# Patient Record
Sex: Female | Born: 1965 | Race: White | Hispanic: No | Smoking: Former smoker
Health system: Southern US, Community
[De-identification: ages and names within clinical notes are randomized; demographics above are authoritative.]

## PROBLEM LIST (undated history)

## (undated) DIAGNOSIS — T7840XA Allergy, unspecified, initial encounter: Secondary | ICD-10-CM

## (undated) DIAGNOSIS — E785 Hyperlipidemia, unspecified: Secondary | ICD-10-CM

## (undated) DIAGNOSIS — F32A Depression, unspecified: Secondary | ICD-10-CM

## (undated) DIAGNOSIS — R7303 Prediabetes: Secondary | ICD-10-CM

## (undated) DIAGNOSIS — I10 Essential (primary) hypertension: Secondary | ICD-10-CM

## (undated) DIAGNOSIS — D649 Anemia, unspecified: Secondary | ICD-10-CM

## (undated) DIAGNOSIS — F419 Anxiety disorder, unspecified: Secondary | ICD-10-CM

## (undated) HISTORY — DX: Hyperlipidemia, unspecified: E78.5

## (undated) HISTORY — DX: Essential (primary) hypertension: I10

## (undated) HISTORY — DX: Allergy, unspecified, initial encounter: T78.40XA

## (undated) HISTORY — DX: Depression, unspecified: F32.A

---

## 2010-01-29 DIAGNOSIS — D709 Neutropenia, unspecified: Secondary | ICD-10-CM | POA: Insufficient documentation

## 2010-06-18 ENCOUNTER — Ambulatory Visit: Payer: Self-pay | Admitting: Oncology

## 2010-06-25 LAB — CBC WITH DIFFERENTIAL/PLATELET
BASO%: 0.2 % (ref 0.0–2.0)
Basophils Absolute: 0 10*3/uL (ref 0.0–0.1)
EOS%: 1.7 % (ref 0.0–7.0)
Eosinophils Absolute: 0.1 10*3/uL (ref 0.0–0.5)
HCT: 36.1 % (ref 34.8–46.6)
HGB: 11.9 g/dL (ref 11.6–15.9)
LYMPH%: 38.8 % (ref 14.0–49.7)
MCH: 26.2 pg (ref 25.1–34.0)
MCHC: 33 g/dL (ref 31.5–36.0)
MCV: 79.5 fL (ref 79.5–101.0)
MONO#: 0.4 10*3/uL (ref 0.1–0.9)
MONO%: 10 % (ref 0.0–14.0)
NEUT#: 2 10*3/uL (ref 1.5–6.5)
NEUT%: 49.3 % (ref 38.4–76.8)
Platelets: 248 10*3/uL (ref 145–400)
RBC: 4.54 10*6/uL (ref 3.70–5.45)
RDW: 13.8 % (ref 11.2–14.5)
WBC: 4 10*3/uL (ref 3.9–10.3)
lymph#: 1.6 10*3/uL (ref 0.9–3.3)
nRBC: 0 % (ref 0–0)

## 2010-06-25 LAB — COMPREHENSIVE METABOLIC PANEL
ALT: 13 U/L (ref 0–35)
AST: 20 U/L (ref 0–37)
Albumin: 4.3 g/dL (ref 3.5–5.2)
Alkaline Phosphatase: 47 U/L (ref 39–117)
BUN: 14 mg/dL (ref 6–23)
CO2: 27 mEq/L (ref 19–32)
Calcium: 9.1 mg/dL (ref 8.4–10.5)
Chloride: 105 mEq/L (ref 96–112)
Creatinine, Ser: 0.85 mg/dL (ref 0.40–1.20)
Glucose, Bld: 90 mg/dL (ref 70–99)
Potassium: 3.8 mEq/L (ref 3.5–5.3)
Sodium: 139 mEq/L (ref 135–145)
Total Bilirubin: 0.4 mg/dL (ref 0.3–1.2)
Total Protein: 6.8 g/dL (ref 6.0–8.3)

## 2010-06-25 LAB — CHCC SMEAR

## 2013-05-30 ENCOUNTER — Institutional Professional Consult (permissible substitution): Payer: Self-pay | Admitting: Cardiology

## 2013-06-13 ENCOUNTER — Encounter: Payer: Self-pay | Admitting: *Deleted

## 2013-06-15 ENCOUNTER — Ambulatory Visit (INDEPENDENT_AMBULATORY_CARE_PROVIDER_SITE_OTHER): Payer: BC Managed Care – PPO | Admitting: Cardiology

## 2013-06-15 ENCOUNTER — Encounter: Payer: Self-pay | Admitting: Cardiology

## 2013-06-15 VITALS — BP 124/86 | HR 83 | Ht 61.0 in | Wt 172.0 lb

## 2013-06-15 DIAGNOSIS — R079 Chest pain, unspecified: Secondary | ICD-10-CM | POA: Insufficient documentation

## 2013-06-15 DIAGNOSIS — E785 Hyperlipidemia, unspecified: Secondary | ICD-10-CM | POA: Insufficient documentation

## 2013-06-15 NOTE — Addendum Note (Signed)
Addended by: Demetrios Loll on: 06/15/2013 11:19 AM   Modules accepted: Orders

## 2013-06-15 NOTE — Progress Notes (Signed)
Patient ID: DARIUS FILLINGIM, female   DOB: [birth year], 47 y.o.   MRN: [id number]    Patient Name: Michele [name] Date of Encounter: 06/15/2013  Primary Care Provider:  No primary provider on file. Primary Cardiologist:  Tobias Alexander, H  Patient Profile  Chest pain   Problem List   Past Medical History  Diagnosis Date  . Hypertension    Past Surgical History  Procedure Laterality Date  . Cesarean section      TWO of them    Allergies  Allergies  Allergen Reactions  . Sulfa Antibiotics     HPI  A very pleasant 47 year old female with prior medical history of hyperlipidemia, currently on no treatment, who is concerned about her chest pain. The patient has a family history of coronary artery disease, her father passed from a major heart attack in his 50s. His only symptoms are mild heartburns. The patient states that she was in excellent health, she just recently moved back from [place], where a couple months ago she started to experience mild pressure-like chest pain. The patient states that the pain is nonradiating . It is located behind the lower sternum and on the left chest, sometimes it feels like indigestion, and it can last from minutes up to 6 hours. It is not associated with any exertion. It usually happens when she is at rest. It is not associated with any other symptoms like dizziness, shortness of breath, she is no prior history of syncope. The patient complains that recently she has gained weight and that she is less active than she wishes for. When she walks the dog she has no symptoms. Her cholesterol was checked while she was in [place] and she only remembers total cholesterol that was 270, when repeated by her primary care physician here in [place] 240. She is supposed to follow with her primary care physician for the management of her cholesterol.  Home Medications  Prior to Admission medications   Medication Sig Start Date End Date Taking?  Authorizing Provider  citalopram (CELEXA) 20 MG tablet Take 20 mg by mouth daily.    Historical Provider, MD    Family History  Family History  Problem Relation Age of Onset  . Hypertension Sister   . Heart attack Father     Social History  History   Social History  . Marital Status: Married    Spouse Name: N/A    Number of Children: N/A  . Years of Education: N/A   Occupational History  . Not on file.   Social History Main Topics  . Smoking status: Former Games developer  . Smokeless tobacco: Not on file  . Alcohol Use: No  . Drug Use: No  . Sexual Activity: Not on file   Other Topics Concern  . Not on file   Social History Narrative   The pt is a homemaker -pt quit smoking cigarettes 2004      Review of Systems, as per history of present illness otherwise negative General:  No chills, fever, night sweats or weight changes.  Cardiovascular:  No chest pain, dyspnea on exertion, edema, orthopnea, palpitations, paroxysmal nocturnal dyspnea. Dermatological: No rash, lesions/masses Respiratory: No cough, dyspnea Urologic: No hematuria, dysuria Abdominal:   No nausea, vomiting, diarrhea, bright red blood per rectum, melena, or hematemesis Neurologic:  No visual changes, wkns, changes in mental status. All other systems reviewed and are otherwise negative except as noted above.  Physical Exam  Blood pressure 124/86, pulse 83, height 5'  1" (1.549 m), weight 172 lb (78.019 kg).  General: Pleasant, NAD Psych: Normal affect. Neuro: Alert and oriented X 3. Moves all extremities spontaneously. HEENT: Normal  Neck: Supple without bruits or JVD. Lungs:  Resp regular and unlabored, CTA. Heart: RRR no s3, s4, or murmurs. Abdomen: Soft, non-tender, non-distended, BS + x 4.  Extremities: No clubbing, cyanosis or edema. DP/PT/Radials 2+ and equal bilaterally.  Accessory Clinical Findings  ECG - SR, 83 BPM    Assessment & Plan  A very pleasant 47 year old female  1.  Atypical chest pain - considering patient's risk factors that are obesity, significant family history, and hyperlipidemia we will order exercise treadmill stress test as her baseline EKG is completely normal  2. Hyperlipidemia - it is possible that this is genetic in origin considering her family history. We will request her cholesterol panel from her primary care physician, in addition to these we'll order NMR lipid panel and a lipoprotein a.   Followup once those tests are performed.   Tobias Alexander, Rexene Edison, MD 06/15/2013, 10:34 AM

## 2013-06-15 NOTE — Addendum Note (Signed)
Addended by: Demetrios Loll on: 06/15/2013 11:23 AM   Modules accepted: Orders

## 2013-06-15 NOTE — Patient Instructions (Signed)
Your physician has requested that you have an exercise tolerance test. For further information please visit [email or web address] Please also follow instruction sheet, as given.  Your physician recommends that you return for lab work in: today  Your physician recommends that you schedule a follow-up appointment in: after GXT

## 2013-06-16 LAB — LIPOPROTEIN A (LPA): Lipoprotein (a): 77 mg/dL — ABNORMAL HIGH (ref 0–30)

## 2013-06-16 LAB — NMR LIPOPROFILE WITH LIPIDS
Cholesterol, Total: 206 mg/dL — ABNORMAL HIGH (ref <200)
HDL Particle Number: 38.7 umol/L (ref >=30.5)
HDL Size: 9.1 nm — ABNORMAL LOW (ref >=9.2)
HDL-C: 63 mg/dL (ref >=40)
LDL (calc): 116 mg/dL — ABNORMAL HIGH (ref <100)
LDL Particle Number: 1549 nmol/L — ABNORMAL HIGH (ref <1000)
LDL Size: 21.6 nm (ref >20.5)
LP-IR Score: 29 (ref <=45)
Large HDL-P: 7.2 umol/L (ref >=4.8)
Large VLDL-P: 1.5 nmol/L (ref <=2.7)
Small LDL Particle Number: 397 nmol/L (ref <=527)
Triglycerides: 134 mg/dL (ref <150)
VLDL Size: 44.7 nm (ref <=46.6)

## 2013-06-24 ENCOUNTER — Ambulatory Visit (INDEPENDENT_AMBULATORY_CARE_PROVIDER_SITE_OTHER): Payer: BC Managed Care – PPO | Admitting: Nurse Practitioner

## 2013-06-24 ENCOUNTER — Ambulatory Visit (INDEPENDENT_AMBULATORY_CARE_PROVIDER_SITE_OTHER): Payer: BC Managed Care – PPO | Admitting: Pharmacist

## 2013-06-24 VITALS — BP 129/80

## 2013-06-24 VITALS — Wt 173.0 lb

## 2013-06-24 DIAGNOSIS — Z79899 Other long term (current) drug therapy: Secondary | ICD-10-CM

## 2013-06-24 DIAGNOSIS — R079 Chest pain, unspecified: Secondary | ICD-10-CM

## 2013-06-24 DIAGNOSIS — E785 Hyperlipidemia, unspecified: Secondary | ICD-10-CM

## 2013-06-24 MED ORDER — ATORVASTATIN CALCIUM 40 MG PO TABS: 40.0000 mg | ORAL_TABLET | Freq: Every day | ORAL | Status: DC

## 2013-06-24 NOTE — Progress Notes (Signed)
Exercise Treadmill Test  Pre-Exercise Testing Evaluation Rhythm: normal sinus  Rate: 78 bpm     Test  Exercise Tolerance Test Ordering MD: Tobias Alexander, MD  Interpreting MD: Norma Fredrickson, NP  Unique Test No: 1  Treadmill:  1  Indication for ETT: chest pain - rule out ischemia  Contraindication to ETT: No   Stress Modality: exercise - treadmill  Cardiac Imaging Performed: non   Protocol: standard Bruce - maximal  Max BP:  152/61  Max MPHR (bpm):  174 85% MPR (bpm):  148  MPHR obtained (bpm):  155 % MPHR obtained:  89%  Reached 85% MPHR (min:sec):  6:30 Total Exercise Time (min-sec):  9 minutes  Workload in METS:  10.1 Borg Scale: 16  Reason ETT Terminated:  patient's desire to stop    ST Segment Analysis At Rest: normal ST segments - no evidence of significant ST depression With Exercise: no evidence of significant ST depression  Other Information Arrhythmia:  No Angina during ETT:  absent (0) Quality of ETT:  diagnostic  ETT Interpretation:  normal - no evidence of ischemia by ST analysis  Comments: Patient presents today for routine GXT. Has had atypical chest pain. Strong FH for CAD.   Today the patient exercised on the standard Bruce protocol for a total of 9 minutes.  Good exercise tolerance.  Adequate blood pressure response.  Clinically negative for chest pain. Test was stopped due to achievement of target heart rate.  EKG negative for ischemia. No significant arrhythmia noted.   Recommendations: CV risk factor modification.  Starting statin today  See back as planned.  Patient is agreeable to this plan and will call if any problems develop in the interim.   Rosalio Macadamia, RN, ANP-C [facility] [address] [place], [place]  [address]

## 2013-06-24 NOTE — Assessment & Plan Note (Addendum)
Given patient's non-modifiable risk factor of Family h/o premature CAD and an elevated Lp(a) which is greater than 2x ULN, we will start a statin today to try and achieve LDL-P goal < 1000 nmol/L.  Patient's weight gain of 20-25 lbs over past 2 years is directly related to her diet while in [place] and lack of exercise while overseas as well.  Since she previously was active and counted calories, she can likely lose much of this weight again now that she has moved back to [place] again.  An elevated Lp(a) assists [place] in risk stratifying patient, and as no studies have shown directly lowering Lp(a) lowers risk (niacin studies), we will instead shoot for a lower LDL goal.  Therefore her LDL goal is < 100 mg/dL instead of < 782 mg/dL, and thus LDL-P goal of < 1000 nmol/L.  Will start her on atorvastatin 40 mg qd in hopes of getting at least 40% LDL reduction, which will also likely get LDL-P to goal of < 1000 nmol/L.  Her improvement in diet and lifestyle will also assist with this.  Patient doesn't plan on having anymore children, but she was advised to stop statin if she becomes pregnant.  No need to recheck Lp(a) as it is used to risk stratify and not going to assist in medication management with future levels.    Plan: 1.  Start atorvastatin 40 mg daily. 2.  Start walking 30-40 minutes per day at minimum.  5 days a week at least. 3.  Reduce breads and desserts in diet.  Try to stick to 1400-1500 calories per day - < 30% from fat.   4.  Try to lose at least 5 lbs over the next 3 months. 5.  Recheck cholesterol in 3 months ( 09/30/13 - Any time of day as long as fasting   ), and see me the next week (10/07/13 at 11:15)  to review results.

## 2013-06-24 NOTE — Progress Notes (Signed)
Patient is pleasant 47 y.o. Female who just moved back to the [place] from [place].  She was living in [place] for the past 2 years while her husband worked there, but now back living [place] the [place]   While living [place] her diet was terrible, and "lived as if she was on vacation" for two years.  Her diet was heavy in chocolate, desserts, breads, etc.  She was not active at all.  She gained 25 lbs over that two year span.  Her previous weight was 145 lbs, and now up to ~ 173 lbs.  Patient's father died of an MI when he was 75 y.o., which is the same age she is now.  She also c/o some CP which seemed to be more related to indigestion.  Getting a treadmill exercise stress test today.  Dr. Delton See thus decided to get an NMR and Lp(a) done on her given her family h/o premature heart disease.  LDL-P and Lp(a) were both elevated.  Of note, when in [place] her TC was up to 270 mg/dL and they wanted to put her on statin, however she declined as she was fearful of the language barrier and need to get routine blood work done in a country she didn't speak the language in.  Risk Factors:  Family history of premature CAD (father MI at 54 y.o.), and elevated Lp(a) of 77 mg/dL, and overweight. Goals:  LDL < 100, non-HDL goal < 161, LDL-P goal < 1000 Medications:  Not currently on lipid lowering medications.  Will start statin today.  Social history: Rarely drinks alcohol.  She smoked for 25 years, but quit in 2004.  Diet:  Two years ago when she was living in [place] she was typically counting calories, and eating low fat diet.  She moved to [place] in 2012 and since then "ate as if she was on vacation" for past 2 years.  She ate lots of chocolate, breads, desserts, etc.  She gained 20-25 lbs over past 2 years while living in [place].  Exercise:  No exercise at all for the past 2 years while living in [place].  Now that she has moved back to [facility]. she is agreeable to start walking regularly again.  She states  she gets dizzy if she jogs too long.  Labs:  06/21/13: Lp(a) 77 mg/dL (goal < 30); LDL-P  0960, LDL 116, HDL 63, TG 134, TC 206, non-HDL 143 (not on lipid lowering meds)  Meds:   - Nasonex - Celexa  Allergies  Allergen Reactions  . Sulfa Antibiotics    Family History  Problem Relation Age of Onset  . Hypertension Sister   . Heart attack Father

## 2013-06-24 NOTE — Patient Instructions (Signed)
1.  Start atorvastatin 40 mg daily. 2.  Start walking 30-40 minutes per day at minimum.  5 days a week at least. 3.  Reduce breads and desserts in diet.  Try to stick to 1400-1500 calories per day - < 30% from fat.   4.  Try to lose at least 5 lbs over the next 3 months. 5.  Recheck cholesterol in 3 months ( 09/30/13 - Any time of day as long as fasting   ), and see me the next week (10/07/13 at 11:15)  to review results.

## 2013-06-29 ENCOUNTER — Encounter: Payer: Self-pay | Admitting: Cardiology

## 2013-06-29 ENCOUNTER — Ambulatory Visit (INDEPENDENT_AMBULATORY_CARE_PROVIDER_SITE_OTHER): Payer: BC Managed Care – PPO | Admitting: Cardiology

## 2013-06-29 VITALS — BP 122/72 | HR 84 | Ht 60.0 in | Wt 171.0 lb

## 2013-06-29 DIAGNOSIS — R0789 Other chest pain: Secondary | ICD-10-CM | POA: Insufficient documentation

## 2013-06-29 DIAGNOSIS — E785 Hyperlipidemia, unspecified: Secondary | ICD-10-CM

## 2013-06-29 NOTE — Patient Instructions (Signed)
Your physician recommends that you continue on your current medications as directed. Please refer to the Current Medication list given to you today.  Your physician wants you to follow-up in: 1 year. You will receive a reminder letter in the mail two months in advance. If you don't receive a letter, please call our office to schedule the follow-up appointment.  

## 2013-06-29 NOTE — Progress Notes (Signed)
Patient ID: TONIYAH DILMORE, female   DOB: Jul 20, 1966, 47 y.o.   MRN: [id number]  Patient Name: Michele [name] Date of Encounter: 06/29/2013  Primary Care Provider:  No primary provider on file. Primary Cardiologist:  Tobias Alexander, H  Patient Profile  Chest pain   Problem List   Past Medical History  Diagnosis Date  . Hypertension    Past Surgical History  Procedure Laterality Date  . Cesarean section      TWO of them    Allergies  Allergies  Allergen Reactions  . Sulfa Antibiotics     HPI  A very pleasant 47 year old female with prior medical history of hyperlipidemia, currently on no treatment, who is concerned about her chest pain. The patient has a family history of coronary artery disease, her father passed from a major heart attack in his 64s. His only symptoms are mild heartburns. The patient states that she was in excellent health, she just recently moved back from [place], where a couple months ago she started to experience mild pressure-like chest pain. The patient states that the pain is nonradiating . It is located behind the lower sternum and on the left chest, sometimes it feels like indigestion, and it can last from minutes up to 6 hours. It is not associated with any exertion. It usually happens when she is at rest. It is not associated with any other symptoms like dizziness, shortness of breath, she is no prior history of syncope. The patient complains that recently she has gained weight and that she is less active than she wishes for. When she walks the dog she has no symptoms. Her cholesterol was checked while she was in [place] and she only remembers total cholesterol that was 270, when repeated by her primary care physician here in [place] 240. She is supposed to follow with her primary care physician for the management of her cholesterol.  2 week follow up.  Home Medications  Prior to Admission medications   Medication Sig Start  Date End Date Taking? Authorizing Provider  citalopram (CELEXA) 20 MG tablet Take 20 mg by mouth daily.    Historical Provider, MD    Family History  Family History  Problem Relation Age of Onset  . Hypertension Sister   . Heart attack Father     Social History  History   Social History  . Marital Status: Married    Spouse Name: N/A    Number of Children: N/A  . Years of Education: N/A   Occupational History  . Not on file.   Social History Main Topics  . Smoking status: Former Games developer  . Smokeless tobacco: Not on file  . Alcohol Use: No  . Drug Use: No  . Sexual Activity: Not on file   Other Topics Concern  . Not on file   Social History Narrative   The pt is a homemaker -pt quit smoking cigarettes 2004      Review of Systems, as per history of present illness otherwise negative General:  No chills, fever, night sweats or weight changes.  Cardiovascular:  No chest pain, dyspnea on exertion, edema, orthopnea, palpitations, paroxysmal nocturnal dyspnea. Dermatological: No rash, lesions/masses Respiratory: No cough, dyspnea Urologic: No hematuria, dysuria Abdominal:   No nausea, vomiting, diarrhea, bright red blood per rectum, melena, or hematemesis Neurologic:  No visual changes, wkns, changes in mental status. All other systems reviewed and are otherwise negative except as noted above.  Physical Exam   General:  Pleasant, NAD Psych: Normal affect. Neuro: Alert and oriented X 3. Moves all extremities spontaneously. HEENT: Normal  Neck: Supple without bruits or JVD. Lungs:  Resp regular and unlabored, CTA. Heart: RRR no s3, s4, or murmurs. Abdomen: Soft, non-tender, non-distended, BS + x 4.  Extremities: No clubbing, cyanosis or edema. DP/PT/Radials 2+ and equal bilaterally.  Accessory Clinical Findings  ECG - SR, 83 BPM  Lipid Panel     Component Value Date/Time   TRIG 134 06/15/2013 1120   LDLCALC 116* 06/15/2013 1120     Assessment & Plan  A  very pleasant 47 year old female  1. Atypical chest pain - normal ETT and good exercise tolerance, no symptoms.  She is very motivated to start exercising, she joined a gym and walks daily.  2. Hyperlipidemia - NMR lipid panel and a lipoprotein a done, LPa 77 (over twice the normal limit) and LDL 116. Riki Rusk Smart following, started on Zocor 40 mg PO daily. [place] started exercise program and weight loss. Follow up lipid profile and CMR in 3 months.   3. H/o HELLP sy and preeclampsia during her second pregnancy, we will monitor BP closely, normal at two consecutive visits.   Followup in lipid clinic in 3 month, with me in 1 year.  Tobias Alexander, Rexene Edison, MD 06/29/2013, 10:42 AM

## 2013-09-30 ENCOUNTER — Other Ambulatory Visit: Payer: BC Managed Care – PPO

## 2013-10-03 ENCOUNTER — Other Ambulatory Visit (INDEPENDENT_AMBULATORY_CARE_PROVIDER_SITE_OTHER): Payer: BC Managed Care – PPO

## 2013-10-03 DIAGNOSIS — E785 Hyperlipidemia, unspecified: Secondary | ICD-10-CM

## 2013-10-03 DIAGNOSIS — Z79899 Other long term (current) drug therapy: Secondary | ICD-10-CM

## 2013-10-03 LAB — HEPATIC FUNCTION PANEL
ALT: 17 U/L (ref 0–35)
AST: 21 U/L (ref 0–37)
Albumin: 3.7 g/dL (ref 3.5–5.2)
Alkaline Phosphatase: 57 U/L (ref 39–117)
Bilirubin, Direct: 0.1 mg/dL (ref 0.0–0.3)
Total Bilirubin: 0.5 mg/dL (ref 0.3–1.2)
Total Protein: 6.7 g/dL (ref 6.0–8.3)

## 2013-10-04 LAB — NMR LIPOPROFILE WITH LIPIDS
Cholesterol, Total: 169 mg/dL (ref <200)
HDL Particle Number: 40.7 umol/L (ref >=30.5)
HDL Size: 9.3 nm (ref >=9.2)
HDL-C: 62 mg/dL (ref >=40)
LDL (calc): 76 mg/dL (ref <100)
LDL Particle Number: 863 nmol/L (ref <1000)
LDL Size: 21.9 nm (ref >20.5)
LP-IR Score: 39 (ref <=45)
Large HDL-P: 8.3 umol/L (ref >=4.8)
Large VLDL-P: 5.3 nmol/L — ABNORMAL HIGH (ref <=2.7)
Small LDL Particle Number: 183 nmol/L (ref <=527)
Triglycerides: 154 mg/dL — ABNORMAL HIGH (ref <150)
VLDL Size: 50 nm — ABNORMAL HIGH (ref <=46.6)

## 2013-10-07 ENCOUNTER — Ambulatory Visit: Payer: BC Managed Care – PPO | Admitting: Pharmacist

## 2013-10-26 ENCOUNTER — Ambulatory Visit: Payer: BC Managed Care – PPO | Admitting: Pharmacist

## 2013-10-28 ENCOUNTER — Encounter: Payer: Self-pay | Admitting: Pharmacist

## 2013-10-28 NOTE — Telephone Encounter (Signed)
Patient had a lot of blood work with [facility] to assess her leg weakness and weight gain, and apparently everything was normal with exception of elevated A1C per patient.  She is not sure if a vitamin D level or TSH was drawn, and will bring this blood work with her to our appointment next week.  She is going to start seeing a trainer next week, and now starting to see a dietician along with her son, as she has gained 10 lbs and not sure why.  I recommended her stop her lipitor for 5 days, the restart at just 1/4 pill daily (10 mg), and see me 11/08/13 and will assess how she is feeling at that time.

## 2013-11-08 ENCOUNTER — Ambulatory Visit (INDEPENDENT_AMBULATORY_CARE_PROVIDER_SITE_OTHER): Payer: BC Managed Care – PPO | Admitting: Pharmacist

## 2013-11-08 VITALS — Wt 180.0 lb

## 2013-11-08 DIAGNOSIS — E785 Hyperlipidemia, unspecified: Secondary | ICD-10-CM

## 2013-11-08 DIAGNOSIS — Z79899 Other long term (current) drug therapy: Secondary | ICD-10-CM

## 2013-11-08 DIAGNOSIS — IMO0001 Reserved for inherently not codable concepts without codable children: Secondary | ICD-10-CM

## 2013-11-08 MED ORDER — PSYLLIUM 28 % PO PACK: 1.0000 | PACK | Freq: Two times a day (BID) | ORAL | Status: DC

## 2013-11-08 MED ORDER — VITAMIN D 50 MCG (2000 UT) PO CAPS: 2000.0000 [IU] | ORAL_CAPSULE | Freq: Every day | ORAL | Status: DC

## 2013-11-08 MED ORDER — ATORVASTATIN CALCIUM 40 MG PO TABS: 20.0000 mg | ORAL_TABLET | Freq: Every day | ORAL | Status: DC

## 2013-11-08 NOTE — Assessment & Plan Note (Signed)
Weight loss is the primary concern today.  We discussed dietary changes and life style modifications that should be made.  She would like a get a trainer three times per week, but may try to start working out at home first to get her endurance up initially she tells me.  Regarding the leg aches, we will have her start a low dose Vitamin D daily, and in 3 months check Vitamin D level with her lipid/liver. Will also have her stop lipitor for 2 weeks to see if any improvement, then restart at lipitor 20 mg qd.  If she notices difference in pain on / off statin, she will let me know.   In order to help with her issue of always being hungry, will add metamucil bid.  She also has some issues with constipation, so hopefully this will help with that also.   She will continue to track calories with FitBit to become more accountable with what she is eating. Plan: 1.  Stop Lipitor x 2 weeks.  In 2 weeks restart at 20 mg once daily (1/2 pill).  If fatigue/leg aches improve off Lipitor, then worsen after restarting, call Ysidro Evert and let him know. 2.  Start using Metamucil twice daily (between meal) to help curb hunger.  1 tablespoon in 8 ounces of water - twice daily. 3.  Join gym with husband and work out 3 times per week.  If prefer to work out at home, a good aerobic video source is T-25 - 25 minutes, and has a low impact option. 4.  Start taking Vitamin D 2,000 units once daily (over the counter).   5.  Recheck cholesterol / liver / vitamin D level in 3 months (01/26/14 - lab - fasting) , see Ysidro Evert 01/31/14 at 11:00 am Ysidro Evert phone - [phone]

## 2013-11-08 NOTE — Progress Notes (Signed)
Patient is pleasant 48 y.o. Female who moved back to the [place] from [place] last year.  She has gained another 5 lbs over past 3 months.  She reduced lipitor from 40 mg down to 10 mg last month to see if muscle aches improved as well.  She was living in [place] for the past 2 years while her husband worked there, but now back living in the [place].   While living [place] her diet was terrible, and "lived as if she was on vacation" for two years.  Her diet was heavy in chocolate, desserts, breads, etc.  She was not active at all.  She gained 25 lbs over that two year span.  Her previous weight was 145 lbs, and now up to ~ 180 lbs.  Patient's father died of an MI when he was 49 y.o., which is the same age she is now.  LDL-P and Lp(a) were both elevated in her, and Lipitor 40 mg qd was started 06/2013.  A few months later she started feeling some leg aches and fatigue, but she wasn't certain it was the lipitor.  I decided to have her stop lipitor for 1 week, then restart at 10 mg daily (1/4 tablet of 40 mg).   Her cholesterol looked much better, with LDL-P number of ~ 860 nmol/L on lipitor 40 mg qd.  She tells me that she didn't feel that much better while off lipitor or on the lower dose.  She thinks it may be a little better, but she's not sure.  She had a lot of blood work done at [facility] (getting scanned to her chart), and there was nothing noticeably out of range.  Of note, when in [place] her TC was up to 270 mg/dL and they wanted to put her on statin, however she declined as she was fearful of the language barrier and need to get routine blood work done in a country she didn't speak the language in.  Of all the blood work she got by [facility], a vitamin D level wasn't drawn.  A low vitamin D may explain some of her leg weakness, so will check this in future.  Risk Factors:  Family history of premature CAD (father MI at 71 y.o.), and elevated Lp(a) of 77 mg/dL, and overweight. Goals:  LDL < 100,  non-HDL goal < 130, LDL-P goal < 1000 Medications:  Lipitor 10 mg qd (1/4 of 40 mg tablet)  Social history: Rarely drinks alcohol.  She smoked for 25 years, but quit in 2004.  Diet:  Patient recently tried Weight Watchers, but had to stop this due to feeling hungry all the time.  She recently got a FitBit and will start tracking her caloric intake. She gained 20-25 lbs over past 2 years while living in [place].  Gained another 5 lbs over past 3 months since back in the country.  Exercise:  She is getting 10,000-15,000 steps daily per her FitBit.  Her husband recently signed them up for gym membership.  She is going to either start going there 3 times weekly, or working out at home.  No exercise at all for the past 2 years while living in [place].   Labs:   10/03/13:  LDL-P number 863, LDL 76, HDL 62, TG 154, TC 169, LFTs normal (Lipitor 40 mg qd at time of labs) 06/21/13: Lp(a) 77 mg/dL (goal < 30); LDL-P  1549, LDL 116, HDL 63, TG 134, TC 206, non-HDL 143 (not on lipid lowering  meds)  Current Outpatient Prescriptions  Medication Sig Dispense Refill  . atorvastatin (LIPITOR) 40 MG tablet Take 10 mg by mouth daily.      . citalopram (CELEXA) 20 MG tablet Take 20 mg by mouth daily.      . mometasone (NASONEX) 50 MCG/ACT nasal spray Place 2 sprays into the nose daily.       No current facility-administered medications for this visit.     Allergies  Allergen Reactions  . Sulfa Antibiotics    Family History  Problem Relation Age of Onset  . Hypertension Sister   . Heart attack Father

## 2013-11-08 NOTE — Patient Instructions (Signed)
1.  Stop Lipitor x 2 weeks.  In 2 weeks restart at 20 mg once daily (1/2 pill).  If fatigue/leg aches improve off Lipitor, then worsen after restarting, call Ysidro Evert and let him know. 2.  Start using Metamucil twice daily (between meal) to help curb hunger.  1 tablespoon in 8 ounces of water - twice daily. 3.  Join gym with husband and work out 3 times per week.  If prefer to work out at home, a good aerobic video source is T-25 - 25 minutes, and has a low impact option. 4.  Start taking Vitamin D 2,000 units once daily (over the counter).   5.  Recheck cholesterol / liver / vitamin D level in 3 months (01/26/14 - lab - fasting) , see Ysidro Evert 01/31/14 at 11:00 am Ysidro Evert phone - [phone]

## 2014-01-26 ENCOUNTER — Other Ambulatory Visit (INDEPENDENT_AMBULATORY_CARE_PROVIDER_SITE_OTHER): Payer: BC Managed Care – PPO | Admitting: *Deleted

## 2014-01-26 DIAGNOSIS — Z79899 Other long term (current) drug therapy: Secondary | ICD-10-CM

## 2014-01-26 DIAGNOSIS — IMO0001 Reserved for inherently not codable concepts without codable children: Secondary | ICD-10-CM

## 2014-01-26 DIAGNOSIS — E785 Hyperlipidemia, unspecified: Secondary | ICD-10-CM

## 2014-01-26 LAB — HEPATIC FUNCTION PANEL
ALT: 14 U/L (ref 0–35)
AST: 19 U/L (ref 0–37)
Albumin: 4 g/dL (ref 3.5–5.2)
Alkaline Phosphatase: 60 U/L (ref 39–117)
Bilirubin, Direct: 0.1 mg/dL (ref 0.0–0.3)
Total Bilirubin: 0.4 mg/dL (ref 0.2–1.2)
Total Protein: 7.1 g/dL (ref 6.0–8.3)

## 2014-01-27 LAB — NMR LIPOPROFILE WITH LIPIDS
Cholesterol, Total: 173 mg/dL (ref <200)
HDL Particle Number: 38.6 umol/L (ref >=30.5)
HDL Size: 9.5 nm (ref >=9.2)
HDL-C: 65 mg/dL (ref >=40)
LDL (calc): 98 mg/dL (ref <100)
LDL Particle Number: 1079 nmol/L — ABNORMAL HIGH (ref <1000)
LDL Size: 21.3 nm (ref >20.5)
LP-IR Score: 26 (ref <=45)
Large HDL-P: 10 umol/L (ref >=4.8)
Large VLDL-P: 1.6 nmol/L (ref <=2.7)
Small LDL Particle Number: 521 nmol/L (ref <=527)
Triglycerides: 52 mg/dL (ref <150)
VLDL Size: 47.3 nm — ABNORMAL HIGH (ref <=46.6)

## 2014-01-27 LAB — VITAMIN D 25 HYDROXY (VIT D DEFICIENCY, FRACTURES): Vit D, 25-Hydroxy: 48 ng/mL (ref 30–89)

## 2014-01-31 ENCOUNTER — Ambulatory Visit: Payer: BC Managed Care – PPO | Admitting: Pharmacist

## 2014-02-14 ENCOUNTER — Ambulatory Visit (INDEPENDENT_AMBULATORY_CARE_PROVIDER_SITE_OTHER): Payer: BC Managed Care – PPO | Admitting: Pharmacist

## 2014-02-14 VITALS — Wt 175.0 lb

## 2014-02-14 DIAGNOSIS — E785 Hyperlipidemia, unspecified: Secondary | ICD-10-CM

## 2014-02-14 DIAGNOSIS — Z79899 Other long term (current) drug therapy: Secondary | ICD-10-CM

## 2014-02-14 MED ORDER — ROSUVASTATIN CALCIUM 5 MG PO TABS: 5.0000 mg | ORAL_TABLET | Freq: Every day | ORAL | Status: DC

## 2014-02-14 NOTE — Progress Notes (Signed)
Patient is pleasant 48 y.o. Female who moved back to the [place] from [place] last year.  She was to start Lipitor 10 mg back 3 months ago, but due to trouble cutting the lipitor 40 mg pill, she just took 20 mg of lipitor instead.  She did have some muscle soreness but was able to push through for a few weeks-months.  She got bloodwork done 2 weeks ago while on her Lipitor 20 mg qd, however soon after had to stop as the leg weakness got much worse.  Her LDL was 98 and LDL-P number was 1079 on lipitor 20 mg qd.  She has been off lipitor for 2 weeks now and the muscle soreness has resolved.  She has lost 5 lbs over past 3 months.   She was living in [place] for the past 2 years while her husband worked there, but now back living in the [place].   While living [place] her diet was terrible, and "lived as if she was on vacation" for two years.    She gained 25 lbs over that two year span.  Her previous weight was 145 lbs, got up to ~ 180 lbs, and now back down to 175 lbs.   Patient's father died of an MI when he was 58 y.o., which is the same age she is now.  LDL-P and Lp(a) were both elevated in her, therefore would like to see LDL < 100 at least, and LDL-P number < 1000.  Of note, when in [place] her TC was up to 270 mg/dL and they wanted to put her on statin, however she declined as she was fearful of the language barrier and need to get routine blood work done in a country she didn't speak the language in.    Risk Factors:  Family history of premature CAD (father MI at 74 y.o.), and elevated Lp(a) of 77 mg/dL, and overweight. Goals:  LDL < 100, non-HDL goal < 130, LDL-P goal < 1000 Medications:  Stopped Lipitor 20 mg ~ 02/02/14 due to muscle aches Social history: Rarely drinks alcohol.  She smoked for 25 years, but quit in 2004.  Diet:  Patient has cut down on portion size.  She tried Weight Watchers but had to stop as she was always hungry.  She isn't counting calories, and stopped using her FitBit.  She  gained 20-25 lbs over past 2 years while living in [place].  She has lost 5 lbs over past 3 months.  Exercise:  She was getting 10,000-15,000 steps daily per her FitBit, but no longer wearing this.  She didn't start going to the gym with husband as we discussed last visit.  She lives an active lifestyle at home, and does all the chores and lots of gardening.  Labs:   01/2014:  LDL-P number 1079, LDL 98, HDL 65, TG 52, TC 173, LFTs normal (Lipitor 20 mg at time of labs, but now off this due to muscle aches) 10/03/13:  LDL-P number 863, LDL 76, HDL 62, TG 154, TC 169, LFTs normal (Lipitor 40 mg qd at time of labs) 06/21/13: Lp(a) 77 mg/dL (goal < 30); LDL-P  1549, LDL 116, HDL 63, TG 134, TC 206, non-HDL 143 (not on lipid lowering meds)  Current Outpatient Prescriptions  Medication Sig Dispense Refill  . Cholecalciferol (VITAMIN D) 2000 UNITS CAPS Take 1 capsule (2,000 Units total) by mouth daily.  30 capsule    . citalopram (CELEXA) 20 MG tablet Take 20 mg by mouth daily.      [name]  mometasone (NASONEX) 50 MCG/ACT nasal spray Place 2 sprays into the nose daily.      . psyllium (METAMUCIL SMOOTH TEXTURE) 28 % packet Take 1 packet by mouth 2 (two) times daily.       No current facility-administered medications for this visit.     Allergies  Allergen Reactions  . Lipitor [Atorvastatin]     Muscle aches on Lipitor 20 mg and 40 mg   . Sulfa Antibiotics    Family History  Problem Relation Age of Onset  . Hypertension Sister   . Heart attack Father

## 2014-02-14 NOTE — Assessment & Plan Note (Addendum)
Patient's muscle aches have resolved since stopping lipitor 20 mg qd.  NMR relatively well controlled on lipitor 20 mg qd, so would like to use something of similar potency but hopefully better tolerated.  We discussed other statins, and will start her on Crestor 5 mg qd.  If this fails, will likely try Livalo 1-2 mg daily.  She is agreeable to this.  Patient will continue weight loss efforts, and recheck NMR / LFTs in 3 months.  She will call if she has issues with Crestor b/t now and then.   Plan: 1.  Stay off lipitor 2.  Start Crestor 5 mg once daily.  Take in morning or evening. 3.  Continue Vitamin D 2,000 units daily 4.  Recheck NMR LipoProfile and hepatic panel in 3 months (05/11/14 - fasting) and see Ysidro Evert a few days later on 05/16/14 at 11 AM.

## 2014-02-14 NOTE — Patient Instructions (Signed)
1.  Stay off lipitor 2.  Start Crestor 5 mg once daily.  Take in morning or evening. 3.  Continue Vitamin D 2,000 units daily 4.  Recheck NMR LipoProfile and hepatic panel in 3 months (05/11/14 - fasting) and see Ysidro Evert a few days later on 05/16/14 at 11 AM.

## 2014-05-11 ENCOUNTER — Other Ambulatory Visit (INDEPENDENT_AMBULATORY_CARE_PROVIDER_SITE_OTHER): Payer: BC Managed Care – PPO | Admitting: *Deleted

## 2014-05-11 DIAGNOSIS — Z79899 Other long term (current) drug therapy: Secondary | ICD-10-CM

## 2014-05-11 DIAGNOSIS — E785 Hyperlipidemia, unspecified: Secondary | ICD-10-CM

## 2014-05-11 LAB — HEPATIC FUNCTION PANEL
ALT: 17 U/L (ref 0–35)
AST: 24 U/L (ref 0–37)
Albumin: 4.1 g/dL (ref 3.5–5.2)
Alkaline Phosphatase: 60 U/L (ref 39–117)
Bilirubin, Direct: 0 mg/dL (ref 0.0–0.3)
Total Bilirubin: 0.3 mg/dL (ref 0.2–1.2)
Total Protein: 7.5 g/dL (ref 6.0–8.3)

## 2014-05-13 LAB — NMR LIPOPROFILE WITH LIPIDS
Cholesterol, Total: 166 mg/dL (ref 100–199)
HDL Particle Number: 41.4 umol/L (ref >=30.5)
HDL Size: 9.6 nm (ref >=9.2)
HDL-C: 69 mg/dL (ref >39)
LDL (calc): 83 mg/dL (ref 0–99)
LDL Particle Number: 883 nmol/L (ref <1000)
LDL Size: 22 nm (ref >=20.8)
LP-IR Score: 27 (ref <=45)
Large HDL-P: 9.8 umol/L (ref >=4.8)
Large VLDL-P: 2.2 nmol/L (ref <=2.7)
Small LDL Particle Number: 241 nmol/L (ref <=527)
Triglycerides: 71 mg/dL (ref 0–149)
VLDL Size: 50.5 nm — ABNORMAL HIGH (ref <=46.6)

## 2014-05-16 ENCOUNTER — Ambulatory Visit: Payer: BC Managed Care – PPO | Admitting: Pharmacist

## 2014-05-22 ENCOUNTER — Ambulatory Visit (INDEPENDENT_AMBULATORY_CARE_PROVIDER_SITE_OTHER): Payer: BC Managed Care – PPO | Admitting: Pharmacist

## 2014-05-22 DIAGNOSIS — E785 Hyperlipidemia, unspecified: Secondary | ICD-10-CM

## 2014-05-22 MED ORDER — ROSUVASTATIN CALCIUM 5 MG PO TABS: 5.0000 mg | ORAL_TABLET | Freq: Every day | ORAL | Status: DC

## 2014-05-22 NOTE — Patient Instructions (Signed)
Continue Crestor 5mg  daily.  Your labs look great.  If you have any problems, please call Gay Filler at [phone]  Recheck labs in 6 months.  If they look good, I will just call you with the results.

## 2014-05-22 NOTE — Assessment & Plan Note (Signed)
Pt's LDL and LDL-P greatly improved with Crestor 5mg  daily over Lipitor 20mg  daily.  Pt is tolerating current therapy with no complaints.  Will continue Crestor 5mg  daily and recheck labs in 6 months.  If she is able to get the test results from study in [place], have asked her to share those with her so we can optimize therapy based on results.

## 2014-05-22 NOTE — Progress Notes (Signed)
HPI Patient is pleasant 48 y.o. Female who is seen for follow up in the [facility].  At her visit in July, she was changed from Lipitor 20mg  daily to Crestor 5mg  daily due to malagias with Lipitor.  She admits she did not start the Crestor until mid-August due to other surgeries occuring during that time, but since starting she has had no issues with tolerability.  She also notes that she has less "chest pain" like symptoms when she takes a statin versus when she is off.    Patient's father died of an MI when he was 76 y.o.  LDL-P and Lp(a) were both elevated in her, therefore would like to see LDL < 100 at least, and LDL-P number < 1000.  She has been approached by a team in [place] to enroll in a study where she would be able to complete genetic testing.  I have encouraged her to do this.  Her 5 yr old son recently had his cholesterol checked and it was >200.      Risk Factors:  Family history of premature CAD (father MI at 84 y.o.), and elevated Lp(a) of 77 mg/dL, and overweight. Goals:  LDL < 100, non-HDL goal < 130, LDL-P goal < 1000 Medications:  Stopped Lipitor 20 mg ~ 02/02/14 due to muscle aches Social history: Rarely drinks alcohol.  She smoked for 25 years, but quit in 2004.  Diet:  Pt reports she has not made any changes to her diet.  She tried Weight Watchers but had to stop as she was always hungry.  She isn't counting calories, and stopped using her FitBit.  She gained 20-25 lbs over past 2 years while living in [place].  She has regained the 5lbs that she originally lost  Exercise:  She was getting 10,000-15,000 steps daily per her FitBit, but no longer wearing this.  She didn't start going to the gym with husband as we discussed last visit.  She lives an active lifestyle at home, and does all the chores and lots of gardening.  Labs:   05/2014: LDL-P number 883, LDL- 83, HDL- 69, TG- 71, TC- 166, LFTs normal (Crestor 5mg  daily) 01/2014:  LDL-P number 1079, LDL 98, HDL 65, TG 52,  TC 173, LFTs normal (Lipitor 20 mg at time of labs, but now off this due to muscle aches) 10/03/13:  LDL-P number 863, LDL 76, HDL 62, TG 154, TC 169, LFTs normal (Lipitor 40 mg qd at time of labs) 06/21/13: Lp(a) 77 mg/dL (goal < 30); LDL-P  1549, LDL 116, HDL 63, TG 134, TC 206, non-HDL 143 (not on lipid lowering meds)  Current Outpatient Prescriptions  Medication Sig Dispense Refill  . Cholecalciferol (VITAMIN D) 2000 UNITS CAPS Take 1 capsule (2,000 Units total) by mouth daily.  30 capsule    . citalopram (CELEXA) 20 MG tablet Take 20 mg by mouth daily.      . mometasone (NASONEX) 50 MCG/ACT nasal spray Place 2 sprays into the nose daily.      . psyllium (METAMUCIL SMOOTH TEXTURE) 28 % packet Take 1 packet by mouth 2 (two) times daily.      . rosuvastatin (CRESTOR) 5 MG tablet Take 1 tablet (5 mg total) by mouth daily.  90 tablet  3   No current facility-administered medications for this visit.     Allergies  Allergen Reactions  . Lipitor [Atorvastatin]     Muscle aches on Lipitor 20 mg and 40 mg   . Sulfa Antibiotics  Family History  Problem Relation Age of Onset  . Hypertension Sister   . Heart attack Father

## 2014-08-11 HISTORY — PX: OTHER SURGICAL HISTORY: SHX169

## 2014-11-15 ENCOUNTER — Other Ambulatory Visit: Payer: BC Managed Care – PPO

## 2014-11-21 ENCOUNTER — Other Ambulatory Visit (INDEPENDENT_AMBULATORY_CARE_PROVIDER_SITE_OTHER): Payer: BLUE CROSS/BLUE SHIELD

## 2014-11-21 DIAGNOSIS — E785 Hyperlipidemia, unspecified: Secondary | ICD-10-CM

## 2014-11-21 LAB — HEPATIC FUNCTION PANEL
ALT: 14 U/L (ref 0–35)
AST: 17 U/L (ref 0–37)
Albumin: 3.9 g/dL (ref 3.5–5.2)
Alkaline Phosphatase: 59 U/L (ref 39–117)
Bilirubin, Direct: 0.1 mg/dL (ref 0.0–0.3)
Total Bilirubin: 0.4 mg/dL (ref 0.2–1.2)
Total Protein: 6.9 g/dL (ref 6.0–8.3)

## 2014-11-23 LAB — NMR LIPOPROFILE WITH LIPIDS
Cholesterol, Total: 175 mg/dL (ref 100–199)
HDL Particle Number: 38.9 umol/L (ref >=30.5)
HDL Size: 9.4 nm (ref >=9.2)
HDL-C: 67 mg/dL (ref >39)
LDL (calc): 96 mg/dL (ref 0–99)
LDL Particle Number: 943 nmol/L (ref <1000)
LDL Size: 21.8 nm (ref >=20.8)
LP-IR Score: <25 (ref <=45)
Large HDL-P: 8.6 umol/L (ref >=4.8)
Large VLDL-P: 1.5 nmol/L (ref <=2.7)
Small LDL Particle Number: 301 nmol/L (ref <=527)
Triglycerides: 60 mg/dL (ref 0–149)
VLDL Size: 42.9 nm (ref <=46.6)

## 2015-04-16 ENCOUNTER — Other Ambulatory Visit: Payer: Self-pay | Admitting: Cardiology

## 2017-01-29 ENCOUNTER — Other Ambulatory Visit: Payer: Self-pay | Admitting: Obstetrics & Gynecology

## 2017-01-30 ENCOUNTER — Ambulatory Visit (HOSPITAL_COMMUNITY): Payer: BLUE CROSS/BLUE SHIELD | Admitting: Anesthesiology

## 2017-01-30 ENCOUNTER — Ambulatory Visit (HOSPITAL_COMMUNITY)
Admission: RE | Admit: 2017-01-30 | Discharge: 2017-01-30 | Disposition: A | Discharge status: Home / Self Care | Payer: BLUE CROSS/BLUE SHIELD | Source: Ambulatory Visit | Attending: Obstetrics & Gynecology | Admitting: Obstetrics & Gynecology

## 2017-01-30 ENCOUNTER — Encounter (HOSPITAL_COMMUNITY)
Admission: RE | Disposition: A | Discharge status: Home / Self Care | Payer: Self-pay | Source: Ambulatory Visit | Attending: Obstetrics & Gynecology

## 2017-01-30 ENCOUNTER — Encounter (HOSPITAL_COMMUNITY): Payer: Self-pay

## 2017-01-30 DIAGNOSIS — I10 Essential (primary) hypertension: Secondary | ICD-10-CM | POA: Diagnosis not present

## 2017-01-30 DIAGNOSIS — N938 Other specified abnormal uterine and vaginal bleeding: Secondary | ICD-10-CM | POA: Insufficient documentation

## 2017-01-30 DIAGNOSIS — Z79899 Other long term (current) drug therapy: Secondary | ICD-10-CM | POA: Insufficient documentation

## 2017-01-30 DIAGNOSIS — Z87891 Personal history of nicotine dependence: Secondary | ICD-10-CM | POA: Insufficient documentation

## 2017-01-30 HISTORY — PX: HYSTEROSCOPY W/D&C: SHX1775

## 2017-01-30 LAB — CBC
HCT: 40.7 % (ref 36.0–46.0)
Hemoglobin: 13.2 g/dL (ref 12.0–15.0)
MCH: 27 pg (ref 26.0–34.0)
MCHC: 32.4 g/dL (ref 30.0–36.0)
MCV: 83.2 fL (ref 78.0–100.0)
Platelets: 276 10*3/uL (ref 150–400)
RBC: 4.89 MIL/uL (ref 3.87–5.11)
RDW: 14.5 % (ref 11.5–15.5)
WBC: 4.2 10*3/uL (ref 4.0–10.5)

## 2017-01-30 LAB — TYPE AND SCREEN
ABO/RH(D): A NEG
Antibody Screen: NEGATIVE

## 2017-01-30 LAB — ABO/RH: ABO/RH(D): A NEG

## 2017-01-30 SURGERY — DILATATION AND CURETTAGE /HYSTEROSCOPY
Anesthesia: General | Site: Vagina

## 2017-01-30 MED ORDER — LIDOCAINE HCL 1 % IJ SOLN
INTRAMUSCULAR | Status: DC | PRN
  Administered 2017-01-30 (×2): 10 mL

## 2017-01-30 MED ORDER — PROPOFOL 10 MG/ML IV BOLUS
INTRAVENOUS | Status: AC
  Filled 2017-01-30: qty 20

## 2017-01-30 MED ORDER — SCOPOLAMINE 1 MG/3DAYS TD PT72
MEDICATED_PATCH | TRANSDERMAL | Status: AC
  Administered 2017-01-30: 1.5 mg via TRANSDERMAL
  Filled 2017-01-30: qty 1

## 2017-01-30 MED ORDER — LIDOCAINE HCL (CARDIAC) 20 MG/ML IV SOLN
INTRAVENOUS | Status: DC | PRN
  Administered 2017-01-30: 60 mg via INTRAVENOUS

## 2017-01-30 MED ORDER — IBUPROFEN 800 MG PO TABS: 800.0000 mg | ORAL_TABLET | Freq: Three times a day (TID) | ORAL | 3 refills | Status: DC | PRN

## 2017-01-30 MED ORDER — LACTATED RINGERS IV SOLN
INTRAVENOUS | Status: DC
  Administered 2017-01-30: 12:00:00 via INTRAVENOUS

## 2017-01-30 MED ORDER — KETOROLAC TROMETHAMINE 30 MG/ML IJ SOLN
INTRAMUSCULAR | Status: DC | PRN
  Administered 2017-01-30: 30 mg via INTRAVENOUS

## 2017-01-30 MED ORDER — SCOPOLAMINE 1 MG/3DAYS TD PT72
1.0000 | MEDICATED_PATCH | Freq: Once | TRANSDERMAL | Status: DC
  Administered 2017-01-30: 1.5 mg via TRANSDERMAL

## 2017-01-30 MED ORDER — PROPOFOL 10 MG/ML IV BOLUS
INTRAVENOUS | Status: DC | PRN
  Administered 2017-01-30: 150 mg via INTRAVENOUS

## 2017-01-30 MED ORDER — KETOROLAC TROMETHAMINE 30 MG/ML IJ SOLN
INTRAMUSCULAR | Status: AC
  Filled 2017-01-30: qty 1

## 2017-01-30 MED ORDER — LIDOCAINE HCL 1 % IJ SOLN
INTRAMUSCULAR | Status: AC
  Filled 2017-01-30: qty 20

## 2017-01-30 MED ORDER — MIDAZOLAM HCL 2 MG/2ML IJ SOLN
INTRAMUSCULAR | Status: AC
  Filled 2017-01-30: qty 2

## 2017-01-30 MED ORDER — DEXAMETHASONE SODIUM PHOSPHATE 10 MG/ML IJ SOLN
INTRAMUSCULAR | Status: DC | PRN
  Administered 2017-01-30: 10 mg via INTRAVENOUS

## 2017-01-30 MED ORDER — LIDOCAINE HCL (CARDIAC) 20 MG/ML IV SOLN
INTRAVENOUS | Status: AC
Start: 2017-01-30 — End: 2017-01-30
  Filled 2017-01-30: qty 5

## 2017-01-30 MED ORDER — ONDANSETRON HCL 4 MG/2ML IJ SOLN: 4.0000 mg | Freq: Once | INTRAMUSCULAR | Status: DC | PRN

## 2017-01-30 MED ORDER — MEPERIDINE HCL 25 MG/ML IJ SOLN: 6.2500 mg | INTRAMUSCULAR | Status: DC | PRN

## 2017-01-30 MED ORDER — ONDANSETRON HCL 4 MG/2ML IJ SOLN
INTRAMUSCULAR | Status: AC
Start: 2017-01-30 — End: 2017-01-30
  Filled 2017-01-30: qty 2

## 2017-01-30 MED ORDER — DEXAMETHASONE SODIUM PHOSPHATE 10 MG/ML IJ SOLN
INTRAMUSCULAR | Status: AC
  Filled 2017-01-30: qty 1

## 2017-01-30 MED ORDER — KETOROLAC TROMETHAMINE 30 MG/ML IJ SOLN: 30.0000 mg | Freq: Once | INTRAMUSCULAR | Status: DC | PRN

## 2017-01-30 MED ORDER — HYDROCODONE-ACETAMINOPHEN 5-325 MG PO TABS: 1.0000 | ORAL_TABLET | ORAL | 0 refills | Status: DC | PRN

## 2017-01-30 MED ORDER — ACETAMINOPHEN 160 MG/5ML PO SOLN: 325.0000 mg | ORAL | Status: DC | PRN

## 2017-01-30 MED ORDER — FENTANYL CITRATE (PF) 100 MCG/2ML IJ SOLN
INTRAMUSCULAR | Status: DC | PRN
  Administered 2017-01-30 (×2): 50 ug via INTRAVENOUS

## 2017-01-30 MED ORDER — ONDANSETRON HCL 4 MG/2ML IJ SOLN
INTRAMUSCULAR | Status: DC | PRN
  Administered 2017-01-30: 4 mg via INTRAVENOUS

## 2017-01-30 MED ORDER — FENTANYL CITRATE (PF) 100 MCG/2ML IJ SOLN: 25.0000 ug | INTRAMUSCULAR | Status: DC | PRN

## 2017-01-30 MED ORDER — MIDAZOLAM HCL 5 MG/5ML IJ SOLN
INTRAMUSCULAR | Status: DC | PRN
  Administered 2017-01-30: 2 mg via INTRAVENOUS

## 2017-01-30 MED ORDER — OXYCODONE HCL 5 MG PO TABS: 5.0000 mg | ORAL_TABLET | Freq: Once | ORAL | Status: DC | PRN

## 2017-01-30 MED ORDER — OXYCODONE HCL 5 MG/5ML PO SOLN: 5.0000 mg | Freq: Once | ORAL | Status: DC | PRN

## 2017-01-30 MED ORDER — ACETAMINOPHEN 325 MG PO TABS: 325.0000 mg | ORAL_TABLET | ORAL | Status: DC | PRN

## 2017-01-30 MED ORDER — LACTATED RINGERS IV SOLN: INTRAVENOUS | Status: DC

## 2017-01-30 MED ORDER — FENTANYL CITRATE (PF) 100 MCG/2ML IJ SOLN
INTRAMUSCULAR | Status: AC
  Filled 2017-01-30: qty 2

## 2017-01-30 SURGICAL SUPPLY — 18 items
BIPOLAR CUTTING LOOP 21FR (ELECTRODE)
CANISTER SUCT 3000ML PPV (MISCELLANEOUS) ×4 IMPLANT
CATH ROBINSON RED A/P 16FR (CATHETERS) IMPLANT
CLOTH BEACON ORANGE TIMEOUT ST (SAFETY) ×4 IMPLANT
CONTAINER PREFILL 10% NBF 60ML (FORM) ×12 IMPLANT
ELECT REM PT RETURN 9FT ADLT (ELECTROSURGICAL)
ELECTRODE REM PT RTRN 9FT ADLT (ELECTROSURGICAL) IMPLANT
GLOVE BIO SURGEON STRL SZ 6.5 (GLOVE) ×6 IMPLANT
GLOVE BIO SURGEONS STRL SZ 6.5 (GLOVE) ×2
GLOVE BIOGEL PI IND STRL 7.0 (GLOVE) ×8 IMPLANT
GLOVE BIOGEL PI INDICATOR 7.0 (GLOVE) ×8
GOWN STRL REUS W/TWL LRG LVL3 (GOWN DISPOSABLE) ×16 IMPLANT
LOOP CUTTING BIPOLAR 21FR (ELECTRODE) IMPLANT
PACK VAGINAL MINOR WOMEN LF (CUSTOM PROCEDURE TRAY) ×8 IMPLANT
PAD OB MATERNITY 4.3X12.25 (PERSONAL CARE ITEMS) ×8 IMPLANT
TOWEL OR 17X24 6PK STRL BLUE (TOWEL DISPOSABLE) ×16 IMPLANT
TUBING AQUILEX INFLOW (TUBING) ×4 IMPLANT
TUBING AQUILEX OUTFLOW (TUBING) ×4 IMPLANT

## 2017-01-30 NOTE — Anesthesia Preprocedure Evaluation (Signed)
Anesthesia Evaluation    Airway Mallampati: I       Dental no notable dental hx. (+) Teeth Intact   Pulmonary former smoker,    Pulmonary exam normal breath sounds clear to auscultation       Cardiovascular hypertension, Normal cardiovascular exam Rhythm:Regular Rate:Normal     Neuro/Psych negative neurological ROS  negative psych ROS   GI/Hepatic negative GI ROS, Neg liver ROS,   Endo/Other  negative endocrine ROS  Renal/GU negative Renal ROS  negative genitourinary   Musculoskeletal negative musculoskeletal ROS (+)   Abdominal Normal abdominal exam  (+)   Peds  Hematology negative hematology ROS (+)   Anesthesia Other Findings Michele [name]  06/24/2013 11:30 AM  Clinical Support  MRN:  [id number]  Description: Female DOB: [birth year] Provider: Burtis Junes, NP; [facility] Department: [facility] Diagnoses     Codes Comments Chest pain - Primary R07.9      Reason for Visit    Chest Pain GXT for Dr. Meda Coffee Reason for Visit History  Vital Signs - Last Recorded  Most recent update: 06/24/2013 12:04 PM by Burtis Junes, NP  BP 129/80      Progress Notes   Deon Pilling, Katrina N at 06/24/2013 11:43 AM   Status: Signed    Exercise Treadmill Test  Pre-Exercise Testing Evaluation  Rhythm: normal sinus  Rate: 78 bpm    Test  Exercise Tolerance Test  Ordering MD: Ena Dawley, MD  Interpreting MD: Truitt Merle, NP Unique Test No: 1  Treadmill:  1 Indication for ETT: chest pain - rule out ischemia  Contraindication to ETT: No  Stress Modality: exercise - treadmill  Cardiac Imaging Performed: non  Protocol: standard Bruce - maximal  Max BP:  152/61 Max MPHR (bpm):  174 85% MPR (bpm):  148 MPHR obtained (bpm):  155 % MPHR obtained:  89% Reached 85% MPHR (min:sec):  6:30 Total Exercise Time (min-sec):  9 minutes Workload in METS:  10.1 Borg Scale: 16 Reason ETT  Terminated:  patient's desire to stop   ST Segment Analysis At Rest:           normal ST segments - no evidence of significant ST depression With Exercise: no evidence of significant ST depression  Other Information Arrhythmia:  No           Angina during ETT:  absent (0) Quality of ETT:  diagnostic  ETT Interpretation:  normal - no evidence of ischemia by ST analysis  Comments: Patient presents today for routine GXT. Has had atypical chest pain. Strong FH for CAD.   Today the patient exercised on the standard Bruce protocol for a total of 9 minutes.  Good exercise tolerance.  Adequate blood pressure response.  Clinically negative for chest pain. Test was stopped due to achievement of target heart rate.  EKG negative for ischemia. No significant arrhythmia noted.   Recommendations: CV risk factor modification.  Starting statin today  See back as planned.  Patient is agreeable to this plan and will call if any problems develop in the interim.   Burtis Junes, RN, ANP-C [place] [address] [place], [place]  [address]       Reproductive/Obstetrics                             Anesthesia Physical Anesthesia Plan  ASA: II  Anesthesia Plan: General   Post-op Pain Management:  Induction: Intravenous  PONV Risk Score and Plan:   Airway Management Planned: LMA  Additional Equipment:   Intra-op Plan:   Post-operative Plan:   Informed Consent: I have reviewed the patients History and Physical, chart, labs and discussed the procedure including the risks, benefits and alternatives for the proposed anesthesia with the patient or authorized representative who has indicated his/her understanding and acceptance.   Dental advisory given  Plan Discussed with: CRNA and Surgeon  Anesthesia Plan Comments:         Anesthesia Quick Evaluation

## 2017-01-30 NOTE — Op Note (Signed)
OPERATIVE REPORT   Patient: Michele [name], Michele [name]  MR#: [id number]   Assistants: None  Pre-operative Diagnosis: Abnormal uterine bleeding  Post-operative Diagnosis: same  Surgeon: Sanjuana Kava MD  Procedure: Dilatation and Curettage / Diagnostic hysteroscopy  Anesthesia: General LMA anesthesia and Local anesthesia 1% plain lidocaine  ASA Class: 2  EBL: 5 mL  UOP: 10 mL  IVF: 800 mL  Specimen: Endometrial curettings  Findings: Exam under anesthesia: Vagina: normal cervix: grossly normal Enlarged uterus, midline decreased mobility palpable fibroids. Adnexa: No palpable masses  Hysteroscopy: Normal endometrium, both tubal ostia seen.   Complications: None  After adequate anesthesia was achieved, the patient was prepped and draped in the usual sterile fashion.  The Graves operative speculum was placed in the vagina and the cervix stabilized with a single-tooth tenaculum.  A paracervical block was performed with 10 mL of 1% plain lidocaine. The uterus was sounded to 9 cm. The  cervix was dilated with Hank dilators to 13 Fr.  The hysteroscope was passed inside the endometrial cavity; Normal saline was used as a distending medium. The above findings were noted and sharp curettage was then performed and uterine curettings sent to pathology.  All instruments were removed from the vagina. Counts were correct.  The patient tolerated the procedure well and was transferred to the recovery room in good condition.  [name] [name]

## 2017-01-30 NOTE — Anesthesia Procedure Notes (Signed)
Procedure Name: LMA Insertion Date/Time: 01/30/2017 12:10 PM Performed by: Barkley Boards L Pre-anesthesia Checklist: Patient identified, Patient being monitored, Emergency Drugs available, Timeout performed and Suction available Patient Re-evaluated:Patient Re-evaluated prior to inductionOxygen Delivery Method: Circle System Utilized Preoxygenation: Pre-oxygenation with 100% oxygen Intubation Type: IV induction Ventilation: Mask ventilation without difficulty LMA: LMA inserted LMA Size: 4.0 Number of attempts: 1 Placement Confirmation: positive ETCO2 and breath sounds checked- equal and bilateral Dental Injury: Teeth and Oropharynx as per pre-operative assessment

## 2017-01-30 NOTE — Anesthesia Postprocedure Evaluation (Signed)
Anesthesia Post Note  Patient: Michele [name]  Procedure(s) Performed: Procedure(s): DILATATION AND CURETTAGE /HYSTEROSCOPY     Patient location during evaluation: PACU Anesthesia Type: General Level of consciousness: awake Pain management: pain level controlled Vital Signs Assessment: post-procedure vital signs reviewed and stable Respiratory status: spontaneous breathing Cardiovascular status: stable Postop Assessment: no signs of nausea or vomiting Anesthetic complications: no    Last Vitals:  Vitals:   01/30/17 1330 01/30/17 1345  BP: (!) 115/103 117/88  Pulse: 76 73  Resp: 15 19  Temp:  36.8 C    Last Pain:  Vitals:   01/30/17 1345  TempSrc:   PainSc: 1    Pain Goal: Patients Stated Pain Goal: 3 (01/30/17 1345)               Varina

## 2017-01-30 NOTE — H&P (Signed)
Michele [name] is an 51 y.o. female  P2 with heavy bleeding and clotting with periods, passes more clots with urination, more cramping with ovulation as well.  Patient reports a regular menses q 21 days heavy for 1st 2-3 days (heavier when sitting, clots and flow) severe cramping during ovulation. No dysmenorrhea. She has chronic pelvic pain intermittent in nature with "twinges" and tenderness on right side. She states her last pelvic exam was so painful. bloating.  She is not SA at this time, but when she was she notes dyspareunia.  She has urinary stress incontinence, "leaks a lot" needs to wear poise for protection. Mostly the leaking is with sneeze, coughing, or any increase in abdominal pressure. No urge incontinence.  irregular bowel movements. every 2-3 days +constipation while on vacation cant go for 7-10 days.  No hematuria, no melena  Last pap 10/06/2016 WNL    Pertinent Gynecological History: Menses: flow is excessive with use of 6 pads or tampons on heaviest days Bleeding: dysfunctional uterine bleeding Contraception: none DES exposure: denies Blood transfusions: none Sexually transmitted diseases: no past history Previous GYN Procedures: none  Last mammogram: normal Date: 2018 Last pap: normal Date: 09/2016 OB History: G2, P2   Menstrual History: Menarche age: 57 Patient's last menstrual period was 01/01/2017 (approximate).    Past Medical History:  Diagnosis Date  . Hypertension    with pregnancies    Past Surgical History:  Procedure Laterality Date  . CESAREAN SECTION     TWO of them  . dental implants N/A 2016   lower 2 teeth    Family History  Problem Relation Age of Onset  . Heart attack Father   . Hypertension Sister     Social History:  reports that she has quit smoking. Her smoking use included Cigarettes. She has a 7.50 pack-year smoking history. She has never used smokeless tobacco. She reports that she does not drink alcohol or use  drugs.  Allergies:  Allergies  Allergen Reactions  . Lipitor [Atorvastatin]     Muscle aches on Lipitor 20 mg and 40 mg   . Sulfa Antibiotics Nausea And Vomiting    Prescriptions Prior to Admission  Medication Sig Dispense Refill Last Dose  . citalopram (CELEXA) 20 MG tablet Take 20 mg by mouth every evening.    Past Week at Unknown time  . CRESTOR 5 MG tablet TAKE 1 TABLET BY MOUTH EVERY DAY (Patient taking differently: TAKE 1 TABLET BY MOUTH EVERY DAY IN THE EVENING) 90 tablet 0 Past Week at Unknown time  . fluticasone (FLONASE) 50 MCG/ACT nasal spray Place 1 spray into both nostrils daily as needed for allergies or rhinitis.   Past Week at Unknown time  . lisdexamfetamine (VYVANSE) 40 MG capsule Take 40 mg by mouth daily.   Past Week at Unknown time    ROS  Blood pressure (!) 145/97, pulse 98, temperature 97.8 F (36.6 C), temperature source Oral, resp. rate 18, height 5\' 1"  (1.549 m), weight 75.8 kg (167 lb), last menstrual period 01/01/2017, SpO2 100 %.   Physical Exam Vulva: no masses, atrophy, or lesions. Mons: no erythema, excoriation, atrophy, lesions, masses, swelling, tenderness, or vesicles/ ulcers and normal. Labia Majora: no erythema, excoriation, atrophy, discoloration, lesions, masses, swelling, tenderness, or vesicles/ ulcers and normal. Labia Minora: no erythema, excoriation, atrophy, discoloration, lesions, vesicles, masses, swelling, or tenderness and normal. Introitus: normal. Bartholin's Gland: normal. Vagina: no discharge, erythema, atrophy, lesions, ulcers, swelling, masses, tenderness, prolapse, or blood present and normal.  Cervix: no lesions, discharge, bleeding, or cervical motion tenderness and grossly normal. Uterus: midline, mobile, non-tender, normal contour, no uterine prolapse, and enlarged 13 weeks size. Urethral Meatus/ Urethra no discharge, masses, or tenderness and normal meatus and hypermobile urethra. Bladder: non-distended, non-tender, and no palpable  mass. Adnexa/Parametria: adnexal mass right.  Results for orders placed or performed during the hospital encounter of 01/30/17 (from the past 24 hour(s))  CBC     Status: None   Collection Time: 01/30/17 11:15 AM  Result Value Ref Range   WBC 4.2 4.0 - 10.5 K/uL   RBC 4.89 3.87 - 5.11 MIL/uL   Hemoglobin 13.2 12.0 - 15.0 g/dL   HCT 40.7 36.0 - 46.0 %   MCV 83.2 78.0 - 100.0 fL   MCH 27.0 26.0 - 34.0 pg   MCHC 32.4 30.0 - 36.0 g/dL   RDW 14.5 11.5 - 15.5 %   Platelets 276 150 - 400 K/uL    No results found.  Assessment/Plan: 51 yo with abnormal uterine bleeding Unable to perform EMBX in office On call to OR for D&C for endometrial sampling, to r/o hyperplasia or malignancy Risks of procedure discussed with patient.   Damare Serano, [place] 01/30/2017, 11:51 AM

## 2017-01-30 NOTE — Transfer of Care (Signed)
Immediate Anesthesia Transfer of Care Note  Patient: Michele [name]  Procedure(s) Performed: Procedure(s): DILATATION AND CURETTAGE /HYSTEROSCOPY  Patient Location: PACU  Anesthesia Type:General  Level of Consciousness: awake  Airway & Oxygen Therapy: Patient Spontanous Breathing and Patient connected to nasal cannula oxygen  Post-op Assessment: Report given to RN and Post -op Vital signs reviewed and stable  Post vital signs: stable  Last Vitals:  Vitals:   01/30/17 1120  BP: (!) 145/97  Pulse: 98  Resp: 18  Temp: 36.6 C    Last Pain:  Vitals:   01/30/17 1120  TempSrc: Oral      Patients Stated Pain Goal: 3 (46/56/81 2751)  Complications: No apparent anesthesia complications

## 2017-01-30 NOTE — Discharge Instructions (Signed)
DISCHARGE INSTRUCTIONS: HYSTEROSCOPY / ENDOMETRIAL ABLATION The following instructions have been prepared to help you care for yourself upon your return home.  May Remove Scop patch on or before  May take Ibuprofen after  May take stool softner while taking narcotic pain medication to prevent constipation.  Drink plenty of water.  Personal hygiene:  Use sanitary pads for vaginal drainage, not tampons.  Shower the day after your procedure.  NO tub baths, pools or Jacuzzis for 2-3 weeks.  Wipe front to back after using the bathroom.  Activity and limitations:  Do NOT drive or operate any equipment for 24 hours. The effects of anesthesia are still present and drowsiness may result.  Do NOT rest in bed all day.  Walking is encouraged.  Walk up and down stairs slowly.  You may resume your normal activity in one to two days or as indicated by your physician. Sexual activity: NO intercourse for at least 2 weeks after the procedure, or as indicated by your Doctor.  Diet: Eat a light meal as desired this evening. You may resume your usual diet tomorrow.  Return to Work: You may resume your work activities in one to two days or as indicated by Marine scientist.  What to expect after your surgery: Expect to have vaginal bleeding/discharge for 2-3 days and spotting for up to 10 days. It is not unusual to have soreness for up to 1-2 weeks. You may have a slight burning sensation when you urinate for the first day. Mild cramps may continue for a couple of days. You may have a regular period in 2-6 weeks.  Call your doctor for any of the following:  Excessive vaginal bleeding or clotting, saturating and changing one pad every hour.  Inability to urinate 6 hours after discharge from hospital.  Pain not relieved by pain medication.  Fever of 100.4 F or greater.  Unusual vaginal discharge or odor.  Return to office _________________Call for an appointment  ___________________ Patients signature: ______________________ Nurses signature ________________________  [facility] [phone] Health - Preparing for Surgery  Before surgery, you can play an important role.  Because skin is not sterile, your skin needs to be as free of germs as possible.  You can reduce the number of germs on you skin by washing with CHG (chlorahexidine gluconate) soap before surgery.  CHG is an antiseptic cleaner which kills germs and bonds with the skin to continue killing germs even after washing.  Please DO NOT use if you have an allergy to CHG or antibacterial soaps.  If your skin becomes reddened/irritated stop using the CHG and inform your nurse when you arrive at Short Stay.  Do not shave (including legs and underarms) for at least 48 hours prior to the first CHG shower.  You may shave your face.  Please follow these instructions carefully:   1.  Shower with CHG Soap the night before surgery and the                                morning of Surgery.  2.  If you choose to wash your hair, wash your hair first as usual with your       normal shampoo.  3.  After you shampoo, rinse your hair and body thoroughly to remove the                      Shampoo.  4.  Use CHG as you would any other liquid soap.  You can apply chg directly       to the skin and wash gently with scrungie or a clean washcloth.  5.  Apply the CHG Soap to your body ONLY FROM THE NECK DOWN.        Do not use on open wounds or open sores.  Avoid contact with your eyes,       ears, mouth and genitals (private parts).  Wash genitals (private parts)       with your normal soap.  6.  Wash thoroughly, paying special attention to the area where your surgery        will be performed.  7.  Thoroughly rinse your body with warm water from the neck down.  8.  DO NOT shower/wash with your normal soap after using and rinsing off       the CHG Soap.  9.  Pat yourself dry with a clean towel.             10.  Wear clean pajamas.            11.  Place clean sheets on your bed the night of your first shower and do not        sleep with pets.  Day of Surgery  Do not apply any lotions/deoderants the morning of surgery.  Please wear clean clothes to the hospital/surgery center.

## 2017-01-31 ENCOUNTER — Encounter (HOSPITAL_COMMUNITY): Payer: Self-pay | Admitting: Obstetrics & Gynecology

## 2020-04-10 DIAGNOSIS — F411 Generalized anxiety disorder: Secondary | ICD-10-CM | POA: Diagnosis not present

## 2020-04-10 DIAGNOSIS — Z79899 Other long term (current) drug therapy: Secondary | ICD-10-CM | POA: Diagnosis not present

## 2020-04-10 DIAGNOSIS — F902 Attention-deficit hyperactivity disorder, combined type: Secondary | ICD-10-CM | POA: Diagnosis not present

## 2020-04-27 DIAGNOSIS — J069 Acute upper respiratory infection, unspecified: Secondary | ICD-10-CM | POA: Diagnosis not present

## 2020-04-27 DIAGNOSIS — R05 Cough: Secondary | ICD-10-CM | POA: Diagnosis not present

## 2020-06-29 DIAGNOSIS — Z23 Encounter for immunization: Secondary | ICD-10-CM | POA: Diagnosis not present

## 2020-06-29 DIAGNOSIS — L749 Eccrine sweat disorder, unspecified: Secondary | ICD-10-CM | POA: Diagnosis not present

## 2020-06-29 DIAGNOSIS — E7800 Pure hypercholesterolemia, unspecified: Secondary | ICD-10-CM | POA: Diagnosis not present

## 2020-06-29 DIAGNOSIS — E559 Vitamin D deficiency, unspecified: Secondary | ICD-10-CM | POA: Insufficient documentation

## 2020-06-29 DIAGNOSIS — R7303 Prediabetes: Secondary | ICD-10-CM | POA: Diagnosis not present

## 2020-06-29 DIAGNOSIS — R5383 Other fatigue: Secondary | ICD-10-CM | POA: Diagnosis not present

## 2020-06-29 DIAGNOSIS — R635 Abnormal weight gain: Secondary | ICD-10-CM | POA: Diagnosis not present

## 2020-06-29 DIAGNOSIS — M2550 Pain in unspecified joint: Secondary | ICD-10-CM | POA: Diagnosis not present

## 2020-07-02 DIAGNOSIS — E119 Type 2 diabetes mellitus without complications: Secondary | ICD-10-CM | POA: Insufficient documentation

## 2020-07-10 DIAGNOSIS — Z79899 Other long term (current) drug therapy: Secondary | ICD-10-CM | POA: Diagnosis not present

## 2020-07-10 DIAGNOSIS — F902 Attention-deficit hyperactivity disorder, combined type: Secondary | ICD-10-CM | POA: Diagnosis not present

## 2020-07-10 DIAGNOSIS — F411 Generalized anxiety disorder: Secondary | ICD-10-CM | POA: Diagnosis not present

## 2020-08-30 DIAGNOSIS — Z20822 Contact with and (suspected) exposure to covid-19: Secondary | ICD-10-CM | POA: Diagnosis not present

## 2020-10-10 DIAGNOSIS — Z79899 Other long term (current) drug therapy: Secondary | ICD-10-CM | POA: Diagnosis not present

## 2020-10-10 DIAGNOSIS — F411 Generalized anxiety disorder: Secondary | ICD-10-CM | POA: Diagnosis not present

## 2020-10-10 DIAGNOSIS — F902 Attention-deficit hyperactivity disorder, combined type: Secondary | ICD-10-CM | POA: Diagnosis not present

## 2021-01-23 DIAGNOSIS — Z79899 Other long term (current) drug therapy: Secondary | ICD-10-CM | POA: Diagnosis not present

## 2021-01-23 DIAGNOSIS — F902 Attention-deficit hyperactivity disorder, combined type: Secondary | ICD-10-CM | POA: Diagnosis not present

## 2021-05-08 DIAGNOSIS — Z79899 Other long term (current) drug therapy: Secondary | ICD-10-CM | POA: Diagnosis not present

## 2021-05-08 DIAGNOSIS — F902 Attention-deficit hyperactivity disorder, combined type: Secondary | ICD-10-CM | POA: Diagnosis not present

## 2021-05-10 DIAGNOSIS — F902 Attention-deficit hyperactivity disorder, combined type: Secondary | ICD-10-CM | POA: Diagnosis not present

## 2021-05-21 DIAGNOSIS — Z124 Encounter for screening for malignant neoplasm of cervix: Secondary | ICD-10-CM | POA: Diagnosis not present

## 2021-05-21 DIAGNOSIS — R102 Pelvic and perineal pain: Secondary | ICD-10-CM | POA: Diagnosis not present

## 2021-05-21 DIAGNOSIS — Z01411 Encounter for gynecological examination (general) (routine) with abnormal findings: Secondary | ICD-10-CM | POA: Diagnosis not present

## 2021-05-21 DIAGNOSIS — N946 Dysmenorrhea, unspecified: Secondary | ICD-10-CM | POA: Diagnosis not present

## 2021-05-21 DIAGNOSIS — D259 Leiomyoma of uterus, unspecified: Secondary | ICD-10-CM | POA: Diagnosis not present

## 2021-05-22 ENCOUNTER — Other Ambulatory Visit: Payer: Self-pay | Admitting: Obstetrics & Gynecology

## 2021-05-22 DIAGNOSIS — D259 Leiomyoma of uterus, unspecified: Secondary | ICD-10-CM | POA: Diagnosis not present

## 2021-05-22 DIAGNOSIS — N939 Abnormal uterine and vaginal bleeding, unspecified: Secondary | ICD-10-CM | POA: Diagnosis not present

## 2021-05-22 DIAGNOSIS — R102 Pelvic and perineal pain: Secondary | ICD-10-CM | POA: Diagnosis not present

## 2021-07-22 ENCOUNTER — Other Ambulatory Visit: Payer: Self-pay | Admitting: Obstetrics & Gynecology

## 2021-08-07 ENCOUNTER — Encounter (HOSPITAL_BASED_OUTPATIENT_CLINIC_OR_DEPARTMENT_OTHER): Payer: Self-pay

## 2021-08-07 ENCOUNTER — Ambulatory Visit (HOSPITAL_BASED_OUTPATIENT_CLINIC_OR_DEPARTMENT_OTHER): Admit: 2021-08-07 | Payer: BLUE CROSS/BLUE SHIELD | Admitting: Obstetrics & Gynecology

## 2021-08-07 SURGERY — HYSTERECTOMY, TOTAL, LAPAROSCOPIC
Anesthesia: General

## 2021-09-16 ENCOUNTER — Other Ambulatory Visit: Payer: Self-pay | Admitting: Obstetrics & Gynecology

## 2021-09-17 DIAGNOSIS — J029 Acute pharyngitis, unspecified: Secondary | ICD-10-CM | POA: Diagnosis not present

## 2021-10-07 DIAGNOSIS — Z79899 Other long term (current) drug therapy: Secondary | ICD-10-CM | POA: Diagnosis not present

## 2021-10-07 DIAGNOSIS — F902 Attention-deficit hyperactivity disorder, combined type: Secondary | ICD-10-CM | POA: Diagnosis not present

## 2021-10-11 DIAGNOSIS — F902 Attention-deficit hyperactivity disorder, combined type: Secondary | ICD-10-CM | POA: Diagnosis not present

## 2021-10-21 NOTE — Pre-Procedure Instructions (Signed)
Surgical Instructions    Your procedure is scheduled on Friday 10/25/21.   Report to [facility] Main Entrance "A" at 07:00 A.M., then check in with the Admitting office.  Call this number if you have problems the morning of surgery:  [phone]   If you have any questions prior to your surgery date call [phone]: Open Monday-Friday 8am-4pm    Remember:  Do not eat after midnight the night before your surgery     Take these medicines the morning of surgery with A SIP OF WATER:   citalopram (CELEXA)   CRESTOR   Take these medicines if needed:   mometasone (NASONEX)   As of today, STOP taking any Aspirin (unless otherwise instructed by your surgeon) Aleve, Naproxen, Ibuprofen, Motrin, Advil, Goody's, BC's, all herbal medications, fish oil, and all vitamins.           Do not wear jewelry or makeup Do not wear lotions, powders, perfumes/colognes, or deodorant. Do not shave 48 hours prior to surgery.  Men may shave face and neck. Do not bring valuables to the hospital. Do not wear nail polish, gel polish, artificial nails, or any other type of covering on natural nails (fingers and toes) If you have artificial nails or gel coating that need to be removed by a nail salon, please have this removed prior to surgery. Artificial nails or gel coating may interfere with anesthesia's ability to adequately monitor your vital signs.  [facility] is not responsible for any belongings or valuables. .   Do NOT Smoke (Tobacco/Vaping)  24 hours prior to your procedure  If you use a CPAP at night, you may bring your mask for your overnight stay.   Contacts, glasses, hearing aids, dentures or partials may not be worn into surgery, please bring cases for these belongings   For patients admitted to the hospital, discharge time will be determined by your treatment team.   Patients discharged the day of surgery will not be allowed to drive home, and someone needs to stay with them for 24  hours.  NO VISITORS WILL BE ALLOWED IN PRE-OP WHERE PATIENTS ARE PREPPED FOR SURGERY.  ONLY 1 SUPPORT PERSON MAY BE PRESENT IN THE WAITING ROOM WHILE YOU ARE IN SURGERY.  IF YOU ARE TO BE ADMITTED, ONCE YOU ARE IN YOUR ROOM YOU WILL BE ALLOWED TWO (2) VISITORS. 1 (ONE) VISITOR MAY STAY OVERNIGHT BUT MUST ARRIVE TO THE ROOM BY 8pm.  Minor children may have two parents present. Special consideration for safety and communication needs will be reviewed on a case by case basis.  Special instructions:    Oral Hygiene is also important to reduce your risk of infection.  Remember - BRUSH YOUR TEETH THE MORNING OF SURGERY WITH YOUR REGULAR TOOTHPASTE   - Preparing For Surgery  Before surgery, you can play an important role. Because skin is not sterile, your skin needs to be as free of germs as possible. You can reduce the number of germs on your skin by washing with CHG (chlorahexidine gluconate) Soap before surgery.  CHG is an antiseptic cleaner which kills germs and bonds with the skin to continue killing germs even after washing.     Please do not use if you have an allergy to CHG or antibacterial soaps. If your skin becomes reddened/irritated stop using the CHG.  Do not shave (including legs and underarms) for at least 48 hours prior to first CHG shower. It is OK to shave your face.  Please  follow these instructions carefully.     Shower the NIGHT BEFORE SURGERY and the MORNING OF SURGERY with CHG Soap.   If you chose to wash your hair, wash your hair first as usual with your normal shampoo. After you shampoo, rinse your hair and body thoroughly to remove the shampoo.  Then [employer] and genitals (private parts) with your normal soap and rinse thoroughly to remove soap.  After that Use CHG Soap as you would any other liquid soap. You can apply CHG directly to the skin and wash gently with a scrungie or a clean washcloth.   Apply the CHG Soap to your body ONLY FROM THE NECK DOWN.  Do  not use on open wounds or open sores. Avoid contact with your eyes, ears, mouth and genitals (private parts). Wash Face and genitals (private parts)  with your normal soap.   Wash thoroughly, paying special attention to the area where your surgery will be performed.  Thoroughly rinse your body with warm water from the neck down.  DO NOT shower/wash with your normal soap after using and rinsing off the CHG Soap.  Pat yourself dry with a CLEAN TOWEL.  Wear CLEAN PAJAMAS to bed the night before surgery  Place CLEAN SHEETS on your bed the night before your surgery  DO NOT SLEEP WITH PETS.   Day of Surgery:  Take a shower with CHG soap. Wear Clean/Comfortable clothing the morning of surgery Do not apply any deodorants/lotions.   Remember to brush your teeth WITH YOUR REGULAR TOOTHPASTE.    COVID testing  If you are going to stay overnight or be admitted after your procedure/surgery and require a pre-op COVID test, please follow these instructions after your COVID test   You are not required to quarantine however you are required to wear a well-fitting mask when you are out and around people not in your household.  If your mask becomes wet or soiled, replace with a new one.  Wash your hands often with soap and water for 20 seconds or clean your hands with an alcohol-based hand sanitizer that contains at least 60% alcohol.  Do not share personal items.  Notify your provider: if you are in close contact with someone who has COVID  or if you develop a fever of 100.4 or greater, sneezing, cough, sore throat, shortness of breath or body aches.    Please read over the following fact sheets that you were given.

## 2021-10-22 ENCOUNTER — Other Ambulatory Visit: Payer: Self-pay

## 2021-10-22 ENCOUNTER — Encounter (HOSPITAL_COMMUNITY)
Admission: RE | Admit: 2021-10-22 | Discharge: 2021-10-22 | Disposition: A | Discharge status: Home / Self Care | Payer: BC Managed Care – PPO | Source: Ambulatory Visit | Attending: Obstetrics & Gynecology | Admitting: Obstetrics & Gynecology

## 2021-10-22 ENCOUNTER — Encounter (HOSPITAL_COMMUNITY): Payer: Self-pay

## 2021-10-22 VITALS — BP 130/80 | HR 100 | Temp 98.5°F | Resp 18 | Ht 61.0 in | Wt 193.9 lb

## 2021-10-22 DIAGNOSIS — N736 Female pelvic peritoneal adhesions (postinfective): Secondary | ICD-10-CM | POA: Diagnosis not present

## 2021-10-22 DIAGNOSIS — R102 Pelvic and perineal pain: Secondary | ICD-10-CM | POA: Diagnosis not present

## 2021-10-22 DIAGNOSIS — I10 Essential (primary) hypertension: Secondary | ICD-10-CM | POA: Diagnosis not present

## 2021-10-22 DIAGNOSIS — Z01818 Encounter for other preprocedural examination: Secondary | ICD-10-CM

## 2021-10-22 DIAGNOSIS — D259 Leiomyoma of uterus, unspecified: Secondary | ICD-10-CM | POA: Diagnosis not present

## 2021-10-22 DIAGNOSIS — D251 Intramural leiomyoma of uterus: Secondary | ICD-10-CM | POA: Diagnosis not present

## 2021-10-22 DIAGNOSIS — Z20822 Contact with and (suspected) exposure to covid-19: Secondary | ICD-10-CM | POA: Insufficient documentation

## 2021-10-22 DIAGNOSIS — Z6835 Body mass index (BMI) 35.0-35.9, adult: Secondary | ICD-10-CM | POA: Diagnosis not present

## 2021-10-22 DIAGNOSIS — Z5331 Laparoscopic surgical procedure converted to open procedure: Secondary | ICD-10-CM | POA: Diagnosis not present

## 2021-10-22 DIAGNOSIS — M25511 Pain in right shoulder: Secondary | ICD-10-CM | POA: Diagnosis not present

## 2021-10-22 DIAGNOSIS — F419 Anxiety disorder, unspecified: Secondary | ICD-10-CM | POA: Diagnosis not present

## 2021-10-22 DIAGNOSIS — N72 Inflammatory disease of cervix uteri: Secondary | ICD-10-CM | POA: Diagnosis not present

## 2021-10-22 DIAGNOSIS — N939 Abnormal uterine and vaginal bleeding, unspecified: Secondary | ICD-10-CM | POA: Diagnosis not present

## 2021-10-22 DIAGNOSIS — N879 Dysplasia of cervix uteri, unspecified: Secondary | ICD-10-CM | POA: Diagnosis not present

## 2021-10-22 DIAGNOSIS — D630 Anemia in neoplastic disease: Secondary | ICD-10-CM | POA: Diagnosis not present

## 2021-10-22 DIAGNOSIS — Z87891 Personal history of nicotine dependence: Secondary | ICD-10-CM | POA: Diagnosis not present

## 2021-10-22 HISTORY — DX: Anxiety disorder, unspecified: F41.9

## 2021-10-22 HISTORY — DX: Anemia, unspecified: D64.9

## 2021-10-22 LAB — BASIC METABOLIC PANEL
Anion gap: 6 (ref 5–15)
BUN: 16 mg/dL (ref 6–20)
CO2: 26 mmol/L (ref 22–32)
Calcium: 9 mg/dL (ref 8.9–10.3)
Chloride: 106 mmol/L (ref 98–111)
Creatinine, Ser: 0.71 mg/dL (ref 0.44–1.00)
GFR, Estimated: >60 mL/min (ref >60)
Glucose, Bld: 129 mg/dL — ABNORMAL HIGH (ref 70–99)
Potassium: 4.1 mmol/L (ref 3.5–5.1)
Sodium: 138 mmol/L (ref 135–145)

## 2021-10-22 LAB — CBC
HCT: 42.5 % (ref 36.0–46.0)
Hemoglobin: 13.2 g/dL (ref 12.0–15.0)
MCH: 26.3 pg (ref 26.0–34.0)
MCHC: 31.1 g/dL (ref 30.0–36.0)
MCV: 84.7 fL (ref 80.0–100.0)
Platelets: 317 10*3/uL (ref 150–400)
RBC: 5.02 MIL/uL (ref 3.87–5.11)
RDW: 13.9 % (ref 11.5–15.5)
WBC: 5.8 10*3/uL (ref 4.0–10.5)
nRBC: 0 % (ref 0.0–0.2)

## 2021-10-22 LAB — TYPE AND SCREEN
ABO/RH(D): A NEG
Antibody Screen: NEGATIVE

## 2021-10-22 LAB — SARS CORONAVIRUS 2 (TAT 6-24 HRS): SARS Coronavirus 2: NEGATIVE

## 2021-10-22 NOTE — Progress Notes (Signed)
PCP - Patient states she uses Dr. Alwyn Pea as her PCP ?Cardiologist - n/a ? ?PPM/ICD - n/a ?Device Orders - n/a ?Rep Notified - n/a ? ?Chest x-ray - n/a ?EKG - 10/22/21 ?Stress Test - 2014 ?ECHO - denies ?Cardiac Cath - denies ? ?Sleep Study - denies ?CPAP - n/a ? ?Fasting Blood Sugar - n/a ?Checks Blood Sugar _____ times a day- n/a ? ?Blood Thinner Instructions: n/a ?Aspirin Instructions: n/a ? ?ERAS Protcol - No. NPO ?PRE-SURGERY Ensure or G2- n/a ? ?COVID TEST- 10/22/21. Pending. ? ? ?Anesthesia review: No ? ?Patient denies shortness of breath, fever, cough and chest pain at PAT appointment ? ? ?All instructions explained to the patient, with a verbal understanding of the material. Patient agrees to go over the instructions while at home for a better understanding. Patient also instructed to self quarantine after being tested for COVID-19. The opportunity to ask questions was provided. ? ? ?

## 2021-10-24 NOTE — Anesthesia Preprocedure Evaluation (Addendum)
Anesthesia Evaluation  ?Patient identified by MRN, date of birth, ID band ?Patient awake ? ? ? ?Reviewed: ?Allergy & Precautions, H&P , NPO status , Patient's Chart, lab work & pertinent test results ? ?Airway ?Mallampati: II ? ?TM Distance: >3 FB ?Neck ROM: Full ? ? ? Dental ?no notable dental hx. ?(+) Teeth Intact, Dental Advisory Given ?  ?Pulmonary ?neg pulmonary ROS, former smoker,  ?  ?Pulmonary exam normal ?breath sounds clear to auscultation ? ? ? ? ? ? Cardiovascular ?Exercise Tolerance: Good ?hypertension,  ?Rhythm:Regular Rate:Normal ? ? ?  ?Neuro/Psych ?Anxiety negative neurological ROS ?   ? GI/Hepatic ?negative GI ROS, Neg liver ROS,   ?Endo/Other  ?Morbid obesity ? Renal/GU ?negative Renal ROS  ?negative genitourinary ?  ?Musculoskeletal ? ? Abdominal ?  ?Peds ? Hematology ? ?(+) Blood dyscrasia, anemia ,   ?Anesthesia Other Findings ? ? Reproductive/Obstetrics ?negative OB ROS ? ?  ? ? ? ? ? ? ? ? ? ? ? ? ? ?  ?  ? ? ? ? ? ? ? ?Anesthesia Physical ?Anesthesia Plan ? ?ASA: 3 ? ?Anesthesia Plan: General  ? ?Post-op Pain Management: Tylenol PO (pre-op)*  ? ?Induction: Intravenous ? ?PONV Risk Score and Plan: 4 or greater and Ondansetron, Dexamethasone and Midazolam ? ?Airway Management Planned: Oral ETT ? ?Additional Equipment:  ? ?Intra-op Plan:  ? ?Post-operative Plan: Extubation in OR ? ?Informed Consent: I have reviewed the patients History and Physical, chart, labs and discussed the procedure including the risks, benefits and alternatives for the proposed anesthesia with the patient or authorized representative who has indicated his/her understanding and acceptance.  ? ? ? ?Dental advisory given ? ?Plan Discussed with: CRNA ? ?Anesthesia Plan Comments:   ? ? ? ? ? ?Anesthesia Quick Evaluation ? ?

## 2021-10-25 ENCOUNTER — Encounter (HOSPITAL_COMMUNITY)
Admission: AD | Disposition: A | Discharge status: Home / Self Care | Payer: Self-pay | Source: Ambulatory Visit | Attending: Obstetrics & Gynecology

## 2021-10-25 ENCOUNTER — Other Ambulatory Visit: Payer: Self-pay

## 2021-10-25 ENCOUNTER — Ambulatory Visit (HOSPITAL_COMMUNITY): Payer: BC Managed Care – PPO | Admitting: Anesthesiology

## 2021-10-25 ENCOUNTER — Encounter (HOSPITAL_COMMUNITY): Payer: Self-pay | Admitting: Obstetrics & Gynecology

## 2021-10-25 ENCOUNTER — Inpatient Hospital Stay (HOSPITAL_COMMUNITY)
Admission: AD | Admit: 2021-10-25 | Discharge: 2021-10-28 | DRG: 743 | Disposition: A | Discharge status: Home / Self Care | Payer: BC Managed Care – PPO | Source: Ambulatory Visit | Attending: Obstetrics & Gynecology | Admitting: Obstetrics & Gynecology

## 2021-10-25 DIAGNOSIS — Z6835 Body mass index (BMI) 35.0-35.9, adult: Secondary | ICD-10-CM

## 2021-10-25 DIAGNOSIS — Z5331 Laparoscopic surgical procedure converted to open procedure: Secondary | ICD-10-CM

## 2021-10-25 DIAGNOSIS — M25511 Pain in right shoulder: Secondary | ICD-10-CM | POA: Diagnosis not present

## 2021-10-25 DIAGNOSIS — R102 Pelvic and perineal pain: Secondary | ICD-10-CM | POA: Diagnosis present

## 2021-10-25 DIAGNOSIS — D630 Anemia in neoplastic disease: Secondary | ICD-10-CM | POA: Diagnosis not present

## 2021-10-25 DIAGNOSIS — D259 Leiomyoma of uterus, unspecified: Principal | ICD-10-CM | POA: Diagnosis present

## 2021-10-25 DIAGNOSIS — Z20822 Contact with and (suspected) exposure to covid-19: Secondary | ICD-10-CM | POA: Diagnosis present

## 2021-10-25 DIAGNOSIS — I10 Essential (primary) hypertension: Secondary | ICD-10-CM | POA: Diagnosis not present

## 2021-10-25 DIAGNOSIS — F419 Anxiety disorder, unspecified: Secondary | ICD-10-CM | POA: Diagnosis not present

## 2021-10-25 DIAGNOSIS — N939 Abnormal uterine and vaginal bleeding, unspecified: Secondary | ICD-10-CM | POA: Diagnosis present

## 2021-10-25 DIAGNOSIS — Z87891 Personal history of nicotine dependence: Secondary | ICD-10-CM | POA: Diagnosis not present

## 2021-10-25 DIAGNOSIS — Z90710 Acquired absence of both cervix and uterus: Secondary | ICD-10-CM

## 2021-10-25 HISTORY — PX: HYSTERECTOMY ABDOMINAL WITH SALPINGECTOMY: SHX6725

## 2021-10-25 HISTORY — PX: LAPAROSCOPY: SHX197

## 2021-10-25 HISTORY — PX: CYSTOSCOPY: SHX5120

## 2021-10-25 HISTORY — DX: Acquired absence of both cervix and uterus: Z90.710

## 2021-10-25 LAB — POCT PREGNANCY, URINE: Preg Test, Ur: NEGATIVE

## 2021-10-25 SURGERY — CYSTOSCOPY
Anesthesia: General | Site: Bladder

## 2021-10-25 MED ORDER — IBUPROFEN 600 MG PO TABS: 600.0000 mg | ORAL_TABLET | Freq: Four times a day (QID) | ORAL | Status: DC

## 2021-10-25 MED ORDER — EPHEDRINE SULFATE-NACL 50-0.9 MG/10ML-% IV SOSY
PREFILLED_SYRINGE | INTRAVENOUS | Status: DC | PRN
  Administered 2021-10-25: 5 mg via INTRAVENOUS

## 2021-10-25 MED ORDER — ROCURONIUM BROMIDE 10 MG/ML (PF) SYRINGE
PREFILLED_SYRINGE | INTRAVENOUS | Status: AC
  Filled 2021-10-25: qty 10

## 2021-10-25 MED ORDER — LACTATED RINGERS IV SOLN: INTRAVENOUS | Status: DC

## 2021-10-25 MED ORDER — BUPIVACAINE LIPOSOME 1.3 % IJ SUSP
INTRAMUSCULAR | Status: AC
  Filled 2021-10-25: qty 20

## 2021-10-25 MED ORDER — ONDANSETRON HCL 4 MG/2ML IJ SOLN
INTRAMUSCULAR | Status: AC
  Filled 2021-10-25: qty 2

## 2021-10-25 MED ORDER — HYDROMORPHONE HCL 1 MG/ML IJ SOLN
0.2500 mg | INTRAMUSCULAR | Status: DC | PRN
  Administered 2021-10-25 (×2): 0.5 mg via INTRAVENOUS

## 2021-10-25 MED ORDER — DEXAMETHASONE SODIUM PHOSPHATE 10 MG/ML IJ SOLN
INTRAMUSCULAR | Status: DC | PRN
Start: 2021-10-25 — End: 2021-10-25
  Administered 2021-10-25: 10 mg via INTRAVENOUS

## 2021-10-25 MED ORDER — ONDANSETRON HCL 4 MG/2ML IJ SOLN
INTRAMUSCULAR | Status: DC | PRN
  Administered 2021-10-25: 4 mg via INTRAVENOUS

## 2021-10-25 MED ORDER — NALOXONE HCL 0.4 MG/ML IJ SOLN: 0.4000 mg | INTRAMUSCULAR | Status: DC | PRN

## 2021-10-25 MED ORDER — POVIDONE-IODINE 10 % EX SWAB
2.0000 "application " | Freq: Once | CUTANEOUS | Status: AC
  Administered 2021-10-25: 2 via TOPICAL

## 2021-10-25 MED ORDER — ACETAMINOPHEN 10 MG/ML IV SOLN
INTRAVENOUS | Status: AC
Start: 2021-10-25 — End: 2021-10-26
  Filled 2021-10-25: qty 100

## 2021-10-25 MED ORDER — HYDROMORPHONE HCL 1 MG/ML IJ SOLN
INTRAMUSCULAR | Status: AC
  Administered 2021-10-25: 0.5 mg via INTRAVENOUS
  Filled 2021-10-25: qty 1

## 2021-10-25 MED ORDER — CEFAZOLIN SODIUM-DEXTROSE 2-4 GM/100ML-% IV SOLN
2.0000 g | INTRAVENOUS | Status: AC
  Administered 2021-10-25: 2 g via INTRAVENOUS
  Filled 2021-10-25: qty 100

## 2021-10-25 MED ORDER — PROPOFOL 10 MG/ML IV BOLUS
INTRAVENOUS | Status: DC | PRN
  Administered 2021-10-25: 130 mg via INTRAVENOUS

## 2021-10-25 MED ORDER — KETOROLAC TROMETHAMINE 30 MG/ML IJ SOLN
30.0000 mg | Freq: Once | INTRAMUSCULAR | Status: AC
  Administered 2021-10-25: 30 mg via INTRAVENOUS

## 2021-10-25 MED ORDER — HYDROMORPHONE 1 MG/ML IV SOLN
INTRAVENOUS | Status: DC
  Administered 2021-10-25: 3.2 mg via INTRAVENOUS
  Administered 2021-10-26: 0.3 mg via INTRAVENOUS
  Administered 2021-10-26: 1.8 mL via INTRAVENOUS
  Filled 2021-10-25: qty 30

## 2021-10-25 MED ORDER — ACETAMINOPHEN 500 MG PO TABS
1000.0000 mg | ORAL_TABLET | Freq: Once | ORAL | Status: AC
  Administered 2021-10-25: 1000 mg via ORAL
  Filled 2021-10-25: qty 2

## 2021-10-25 MED ORDER — KETOROLAC TROMETHAMINE 30 MG/ML IJ SOLN
INTRAMUSCULAR | Status: AC
  Administered 2021-10-26: 30 mg
  Filled 2021-10-25: qty 1

## 2021-10-25 MED ORDER — GABAPENTIN 100 MG PO CAPS
100.0000 mg | ORAL_CAPSULE | Freq: Three times a day (TID) | ORAL | Status: DC
  Administered 2021-10-25 – 2021-10-28 (×8): 100 mg via ORAL
  Filled 2021-10-25 (×8): qty 1

## 2021-10-25 MED ORDER — HYDROMORPHONE HCL 1 MG/ML IJ SOLN
INTRAMUSCULAR | Status: AC
Start: 2021-10-25 — End: 2021-10-26
  Filled 2021-10-25: qty 1

## 2021-10-25 MED ORDER — FENTANYL CITRATE (PF) 250 MCG/5ML IJ SOLN
INTRAMUSCULAR | Status: AC
  Filled 2021-10-25: qty 5

## 2021-10-25 MED ORDER — ROCURONIUM BROMIDE 10 MG/ML (PF) SYRINGE
PREFILLED_SYRINGE | INTRAVENOUS | Status: AC
  Filled 2021-10-25: qty 30

## 2021-10-25 MED ORDER — CHLORHEXIDINE GLUCONATE 0.12 % MT SOLN
15.0000 mL | Freq: Once | OROMUCOSAL | Status: AC
  Administered 2021-10-25: 15 mL via OROMUCOSAL
  Filled 2021-10-25: qty 15

## 2021-10-25 MED ORDER — PROPOFOL 10 MG/ML IV BOLUS
INTRAVENOUS | Status: AC
  Filled 2021-10-25: qty 20

## 2021-10-25 MED ORDER — ONDANSETRON HCL 4 MG/2ML IJ SOLN: 4.0000 mg | Freq: Four times a day (QID) | INTRAMUSCULAR | Status: DC | PRN

## 2021-10-25 MED ORDER — FENTANYL CITRATE (PF) 250 MCG/5ML IJ SOLN
INTRAMUSCULAR | Status: DC | PRN
  Administered 2021-10-25 (×2): 50 ug via INTRAVENOUS
  Administered 2021-10-25: 100 ug via INTRAVENOUS
  Administered 2021-10-25: 50 ug via INTRAVENOUS

## 2021-10-25 MED ORDER — MIDAZOLAM HCL 2 MG/2ML IJ SOLN
INTRAMUSCULAR | Status: AC
  Filled 2021-10-25: qty 2

## 2021-10-25 MED ORDER — ROSUVASTATIN CALCIUM 5 MG PO TABS
5.0000 mg | ORAL_TABLET | Freq: Every day | ORAL | Status: DC
  Administered 2021-10-26 – 2021-10-27 (×2): 5 mg via ORAL
  Filled 2021-10-25 (×2): qty 1

## 2021-10-25 MED ORDER — PANTOPRAZOLE SODIUM 40 MG IV SOLR
40.0000 mg | Freq: Every day | INTRAVENOUS | Status: DC
  Administered 2021-10-25: 40 mg via INTRAVENOUS
  Filled 2021-10-25: qty 10

## 2021-10-25 MED ORDER — DIPHENHYDRAMINE HCL 12.5 MG/5ML PO ELIX: 12.5000 mg | ORAL_SOLUTION | Freq: Four times a day (QID) | ORAL | Status: DC | PRN

## 2021-10-25 MED ORDER — LIDOCAINE 2% (20 MG/ML) 5 ML SYRINGE
INTRAMUSCULAR | Status: AC
  Filled 2021-10-25: qty 5

## 2021-10-25 MED ORDER — KETOROLAC TROMETHAMINE 30 MG/ML IJ SOLN
30.0000 mg | Freq: Four times a day (QID) | INTRAMUSCULAR | Status: DC
  Administered 2021-10-25 – 2021-10-26 (×3): 30 mg via INTRAVENOUS
  Filled 2021-10-25 (×3): qty 1

## 2021-10-25 MED ORDER — CITALOPRAM HYDROBROMIDE 20 MG PO TABS
20.0000 mg | ORAL_TABLET | Freq: Every day | ORAL | Status: DC
  Administered 2021-10-26 – 2021-10-27 (×2): 20 mg via ORAL
  Filled 2021-10-25 (×3): qty 1

## 2021-10-25 MED ORDER — VASOPRESSIN 20 UNIT/ML IV SOLN
INTRAVENOUS | Status: AC
  Filled 2021-10-25: qty 2

## 2021-10-25 MED ORDER — DEXAMETHASONE SODIUM PHOSPHATE 10 MG/ML IJ SOLN
INTRAMUSCULAR | Status: AC
  Filled 2021-10-25: qty 1

## 2021-10-25 MED ORDER — EPHEDRINE 5 MG/ML INJ
INTRAVENOUS | Status: AC
  Filled 2021-10-25: qty 5

## 2021-10-25 MED ORDER — INDOCYANINE GREEN 25 MG IV SOLR
INTRAVENOUS | Status: DC | PRN
  Administered 2021-10-25: 5 mg via INTRAVENOUS

## 2021-10-25 MED ORDER — BUPIVACAINE HCL (PF) 0.25 % IJ SOLN
INTRAMUSCULAR | Status: AC
  Filled 2021-10-25: qty 30

## 2021-10-25 MED ORDER — PHENYLEPHRINE HCL-NACL 20-0.9 MG/250ML-% IV SOLN
INTRAVENOUS | Status: DC | PRN
  Administered 2021-10-25: 40 ug/min via INTRAVENOUS

## 2021-10-25 MED ORDER — SUGAMMADEX SODIUM 200 MG/2ML IV SOLN
INTRAVENOUS | Status: DC | PRN
  Administered 2021-10-25: 300 mg via INTRAVENOUS

## 2021-10-25 MED ORDER — MIDAZOLAM HCL 2 MG/2ML IJ SOLN
INTRAMUSCULAR | Status: DC | PRN
  Administered 2021-10-25: 2 mg via INTRAVENOUS

## 2021-10-25 MED ORDER — LIDOCAINE 2% (20 MG/ML) 5 ML SYRINGE
INTRAMUSCULAR | Status: DC | PRN
  Administered 2021-10-25: 60 mg via INTRAVENOUS

## 2021-10-25 MED ORDER — MENTHOL 3 MG MT LOZG: 1.0000 | LOZENGE | OROMUCOSAL | Status: DC | PRN

## 2021-10-25 MED ORDER — VASOPRESSIN 20 UNIT/ML IV SOLN
INTRAVENOUS | Status: DC | PRN
  Administered 2021-10-25: 40 [IU]

## 2021-10-25 MED ORDER — ORAL CARE MOUTH RINSE: 15.0000 mL | Freq: Once | OROMUCOSAL | Status: AC

## 2021-10-25 MED ORDER — DIPHENHYDRAMINE HCL 50 MG/ML IJ SOLN: 12.5000 mg | Freq: Four times a day (QID) | INTRAMUSCULAR | Status: DC | PRN

## 2021-10-25 MED ORDER — PHENYLEPHRINE 40 MCG/ML (10ML) SYRINGE FOR IV PUSH (FOR BLOOD PRESSURE SUPPORT)
PREFILLED_SYRINGE | INTRAVENOUS | Status: DC | PRN
  Administered 2021-10-25: 40 ug via INTRAVENOUS
  Administered 2021-10-25 (×2): 80 ug via INTRAVENOUS
  Administered 2021-10-25 (×2): 120 ug via INTRAVENOUS
  Administered 2021-10-25 (×3): 80 ug via INTRAVENOUS

## 2021-10-25 MED ORDER — HYDROMORPHONE HCL 1 MG/ML IJ SOLN
0.5000 mg | INTRAMUSCULAR | Status: DC | PRN
  Administered 2021-10-25: 0.5 mg via INTRAVENOUS

## 2021-10-25 MED ORDER — AMPHETAMINE-DEXTROAMPHET ER 30 MG PO CP24: 30.0000 mg | ORAL_CAPSULE | Freq: Every day | ORAL | Status: DC

## 2021-10-25 MED ORDER — BUPIVACAINE HCL 0.25 % IJ SOLN
INTRAMUSCULAR | Status: DC | PRN
  Administered 2021-10-25: 10 mL

## 2021-10-25 MED ORDER — ONDANSETRON HCL 4 MG PO TABS: 4.0000 mg | ORAL_TABLET | Freq: Four times a day (QID) | ORAL | Status: DC | PRN

## 2021-10-25 MED ORDER — ROCURONIUM BROMIDE 10 MG/ML (PF) SYRINGE
PREFILLED_SYRINGE | INTRAVENOUS | Status: DC | PRN
  Administered 2021-10-25: 20 mg via INTRAVENOUS
  Administered 2021-10-25: 10 mg via INTRAVENOUS
  Administered 2021-10-25: 60 mg via INTRAVENOUS
  Administered 2021-10-25 (×2): 20 mg via INTRAVENOUS

## 2021-10-25 MED ORDER — SODIUM CHLORIDE 0.9 % IR SOLN
Status: DC | PRN
  Administered 2021-10-25: 1000 mL

## 2021-10-25 MED ORDER — ACETAMINOPHEN 10 MG/ML IV SOLN
1000.0000 mg | Freq: Four times a day (QID) | INTRAVENOUS | Status: DC
  Administered 2021-10-25: 1000 mg via INTRAVENOUS

## 2021-10-25 MED ORDER — SODIUM CHLORIDE 0.9% FLUSH: 9.0000 mL | INTRAVENOUS | Status: DC | PRN

## 2021-10-25 MED ORDER — BUPIVACAINE HCL (PF) 0.25 % IJ SOLN
INTRAMUSCULAR | Status: DC | PRN
  Administered 2021-10-25: 3 mL

## 2021-10-25 MED ORDER — PHENYLEPHRINE 40 MCG/ML (10ML) SYRINGE FOR IV PUSH (FOR BLOOD PRESSURE SUPPORT)
PREFILLED_SYRINGE | INTRAVENOUS | Status: AC
  Filled 2021-10-25: qty 20

## 2021-10-25 MED ORDER — STERILE WATER FOR INJECTION IJ SOLN
INTRAMUSCULAR | Status: AC
  Filled 2021-10-25: qty 70

## 2021-10-25 SURGICAL SUPPLY — 68 items
BARRIER ADHS 3X4 INTERCEED (GAUZE/BANDAGES/DRESSINGS) ×4 IMPLANT
BENZOIN TINCTURE PRP APPL 2/3 (GAUZE/BANDAGES/DRESSINGS) ×2 IMPLANT
CABLE HIGH FREQUENCY MONO STRZ (ELECTRODE) ×4 IMPLANT
CLEANER TIP ELECTROSURG 2X2 (MISCELLANEOUS) ×2 IMPLANT
COVER MAYO STAND STRL (DRAPES) ×4 IMPLANT
DERMABOND ADVANCED (GAUZE/BANDAGES/DRESSINGS) ×1
DERMABOND ADVANCED .7 DNX12 (GAUZE/BANDAGES/DRESSINGS) ×3 IMPLANT
DRSG OPSITE POSTOP 4X10 (GAUZE/BANDAGES/DRESSINGS) ×2 IMPLANT
DRSG PAD ABDOMINAL 8X10 ST (GAUZE/BANDAGES/DRESSINGS) ×2 IMPLANT
DURAPREP 26ML APPLICATOR (WOUND CARE) ×4 IMPLANT
ELECT BLADE 6.5 EXT (BLADE) ×2 IMPLANT
FILTER SMOKE EVAC LAPAROSHD (FILTER) ×4 IMPLANT
GAUZE SPONGE 4X4 12PLY STRL (GAUZE/BANDAGES/DRESSINGS) ×2 IMPLANT
GLOVE SURG ENC MOIS LTX SZ7 (GLOVE) ×4 IMPLANT
GLOVE SURG UNDER POLY LF SZ7 (GLOVE) ×16 IMPLANT
GOWN STRL REUS W/ TWL LRG LVL3 (GOWN DISPOSABLE) ×9 IMPLANT
GOWN STRL REUS W/TWL LRG LVL3 (GOWN DISPOSABLE) ×3
HIBICLENS CHG 4% 4OZ BTL (MISCELLANEOUS) ×4 IMPLANT
IRRIG SUCT STRYKERFLOW 2 WTIP (MISCELLANEOUS) ×4
IRRIGATION SUCT STRKRFLW 2 WTP (MISCELLANEOUS) ×3 IMPLANT
KIT TURNOVER KIT B (KITS) ×4 IMPLANT
LIGASURE VESSEL 5MM BLUNT TIP (ELECTROSURGICAL) ×4 IMPLANT
MANIPULATOR ADVINCU DEL 2.5 PL (MISCELLANEOUS) IMPLANT
MANIPULATOR ADVINCU DEL 3.0 PL (MISCELLANEOUS) IMPLANT
MANIPULATOR ADVINCU DEL 3.5 PL (MISCELLANEOUS) IMPLANT
MANIPULATOR ADVINCU DEL 4.0 PL (MISCELLANEOUS) IMPLANT
NEEDLE 22X1 1/2 (OR ONLY) (NEEDLE) ×2 IMPLANT
NS IRRIG 1000ML POUR BTL (IV SOLUTION) ×4 IMPLANT
PACK LAPAROSCOPY BASIN (CUSTOM PROCEDURE TRAY) ×4 IMPLANT
PACK TRENDGUARD 450 HYBRID PRO (MISCELLANEOUS) IMPLANT
PENCIL BUTTON HOLSTER BLD 10FT (ELECTRODE) ×2 IMPLANT
POUCH LAPAROSCOPIC INSTRUMENT (MISCELLANEOUS) ×2 IMPLANT
PROTECTOR NERVE ULNAR (MISCELLANEOUS) ×8 IMPLANT
RTRCTR C-SECT PINK 25CM LRG (MISCELLANEOUS) ×2 IMPLANT
SCISSORS LAP 5X35 DISP (ENDOMECHANICALS) ×4 IMPLANT
SET CYSTO W/LG BORE CLAMP LF (SET/KITS/TRAYS/PACK) IMPLANT
SET TRI-LUMEN FLTR TB AIRSEAL (TUBING) IMPLANT
SET TUBE SMOKE EVAC HIGH FLOW (TUBING) ×4 IMPLANT
SHEET LAVH (DRAPES) ×2 IMPLANT
SLEEVE ENDOPATH XCEL 5M (ENDOMECHANICALS) ×4 IMPLANT
STRIP CLOSURE SKIN 1/2X4 (GAUZE/BANDAGES/DRESSINGS) ×2 IMPLANT
SUT CHROMIC 2 0 CT 1 (SUTURE) ×4 IMPLANT
SUT MNCRL AB 4-0 PS2 18 (SUTURE) ×10 IMPLANT
SUT PDS AB 0 CTX 60 (SUTURE) ×4 IMPLANT
SUT PLAIN 2 0 XLH (SUTURE) ×2 IMPLANT
SUT VIC AB 0 CT1 18XCR BRD8 (SUTURE) ×2 IMPLANT
SUT VIC AB 0 CT1 27 (SUTURE) ×3
SUT VIC AB 0 CT1 27XBRD ANBCTR (SUTURE) ×3 IMPLANT
SUT VIC AB 0 CT1 8-18 (SUTURE) ×2
SUT VIC AB 2-0 CT1 (SUTURE) IMPLANT
SUT VIC AB 2-0 CT1 36 (SUTURE) ×2 IMPLANT
SUT VIC AB 4-0 KS 27 (SUTURE) ×2 IMPLANT
SUT VICRYL 0 UR6 27IN ABS (SUTURE) ×4 IMPLANT
SUT VLOC 180 0 9IN  GS21 (SUTURE)
SUT VLOC 180 0 9IN GS21 (SUTURE) IMPLANT
SYR 10ML LL (SYRINGE) IMPLANT
SYR 50ML LL SCALE MARK (SYRINGE) IMPLANT
SYR BULB IRRIG 60ML STRL (SYRINGE) ×8 IMPLANT
TOWEL GREEN STERILE FF (TOWEL DISPOSABLE) ×12 IMPLANT
TRAY FOLEY W/BAG SLVR 14FR (SET/KITS/TRAYS/PACK) ×4 IMPLANT
TRENDGUARD 450 HYBRID PRO PACK (MISCELLANEOUS)
TROCAR 12M 150ML BLUNT (TROCAR) ×2 IMPLANT
TROCAR DILATING TIP 12MM 150MM (ENDOMECHANICALS) ×2 IMPLANT
TROCAR PORT AIRSEAL 8X120 (TROCAR) IMPLANT
TROCAR XCEL NON-BLD 11X100MML (ENDOMECHANICALS) ×4 IMPLANT
TROCAR XCEL NON-BLD 5MMX100MML (ENDOMECHANICALS) ×4 IMPLANT
UNDERPAD 30X36 HEAVY ABSORB (UNDERPADS AND DIAPERS) ×4 IMPLANT
WARMER LAPAROSCOPE (MISCELLANEOUS) ×4 IMPLANT

## 2021-10-25 NOTE — Transfer of Care (Signed)
Immediate Anesthesia Transfer of Care Note ? ?Patient: Michele [name] ? ?Procedure(s) Performed: CYSTOSCOPY (Bladder) ?LAPAROSCOPY DIAGNOSTIC (Abdomen) ?HYSTERECTOMY ABDOMINAL WITH BILATERAL SALPINGECTOMY (Abdomen) ? ?Patient Location: PACU ? ?Anesthesia Type:General ? ?Level of Consciousness: awake, oriented and patient cooperative ? ?Airway & Oxygen Therapy: Patient Spontanous Breathing and Patient connected to face mask oxygen ? ?Post-op Assessment: Report given to RN and Post -op Vital signs reviewed and stable ? ?Post vital signs: Reviewed ? ?Last Vitals:  ?Vitals Value Taken Time  ?BP 122/65 10/25/21 1341  ?Temp    ?Pulse 86 10/25/21 1343  ?Resp 16 10/25/21 1343  ?SpO2 100 % 10/25/21 1343  ?Vitals shown include unvalidated device data. ? ?Last Pain:  ?Vitals:  ? 10/25/21 0719  ?TempSrc:   ?PainSc: 0-No pain  ?   ? ?Patients Stated Pain Goal: 0 (10/25/21 0719) ? ?Complications: No notable events documented. ?

## 2021-10-25 NOTE — Anesthesia Postprocedure Evaluation (Signed)
Anesthesia Post Note ? ?Patient: Michele [name] ? ?Procedure(s) Performed: CYSTOSCOPY (Bladder) ?LAPAROSCOPY DIAGNOSTIC (Abdomen) ?HYSTERECTOMY ABDOMINAL WITH BILATERAL SALPINGECTOMY (Abdomen) ? ?  ? ?Patient location during evaluation: PACU ?Anesthesia Type: General ?Level of consciousness: awake and alert ?Pain management: pain level controlled ?Vital Signs Assessment: post-procedure vital signs reviewed and stable ?Respiratory status: spontaneous breathing, nonlabored ventilation and respiratory function stable ?Cardiovascular status: blood pressure returned to baseline and stable ?Postop Assessment: no apparent nausea or vomiting ?Anesthetic complications: no ? ? ?No notable events documented. ? ?Last Vitals:  ?Vitals:  ? 10/25/21 1411 10/25/21 1426  ?BP: 102/67 108/67  ?Pulse: 81 87  ?Resp: 15 11  ?Temp:    ?SpO2: 97% 96%  ?  ?Last Pain:  ?Vitals:  ? 10/25/21 1421  ?TempSrc:   ?PainSc: 8   ? ? ?  ?  ?  ?  ?  ?  ? ?Agustus Mane,W. EDMOND ? ? ? ? ?

## 2021-10-25 NOTE — Anesthesia Procedure Notes (Signed)
Procedure Name: Intubation ?Date/Time: 10/25/2021 9:53 AM ?Performed by: Vonna Drafts, CRNA ?Pre-anesthesia Checklist: Patient identified, Emergency Drugs available, Suction available and Patient being monitored ?Patient Re-evaluated:Patient Re-evaluated prior to induction ?Oxygen Delivery Method: Circle system utilized ?Preoxygenation: Pre-oxygenation with 100% oxygen ?Induction Type: IV induction ?Ventilation: Mask ventilation without difficulty ?Laryngoscope Size: Mac and 3 ?Grade View: Grade I ?Tube type: Oral ?Tube size: 7.0 mm ?Number of attempts: 1 ?Airway Equipment and Method: Stylet and Oral airway ?Placement Confirmation: ETT inserted through vocal cords under direct vision, positive ETCO2 and breath sounds checked- equal and bilateral ?Secured at: 22 cm ?Tube secured with: Tape ?Dental Injury: Teeth and Oropharynx as per pre-operative assessment  ? ? ? ? ?

## 2021-10-25 NOTE — H&P (Signed)
MD GYN HISTORY AND PHYSICAL ? ?Admission Date: 10/25/2021  6:54 AM  ?Admit Diagnosis: Fibroid uterus ?Patient Name: Michele [name]        ?MRN#: [id number] ? ?Subjective:  ? ? Patient is a 56 y.o. female No obstetric history on file. presents for scheduled TLH, BS.  The indications for procedure are symptomatic fibroid uterus manifested by abnormal uterine bleeding and pelvic pain.  ?Endometrial biopsy in office negative for hyperplasia or malignancy.  ? ? ?Pertinent Gynecological History: ?Menses:flow is excessive with use of 7 pads or tampons on heaviest days  ?Bleeding: dysfunctional uterine bleeding ?Contraception: tubal ligation ?DES exposure: denies ?Blood transfusions: none ?Sexually transmitted diseases: no past history ?Previous GYN Procedures: DNC  ?Last mammogram: normal Date: 2022 ?Last pap: normal Date: 05/2021 ?OB History: G2, P2  ? ?Menstrual History: ?Menarche age: 83 ? ?Medical / Surgical History: ?Past medical history:  ?Past Medical History:  ?Diagnosis Date  ? Anemia   ? Anxiety   ? Hypertension   ? with pregnancies  ?  ?Past surgical history:  ?Past Surgical History:  ?Procedure Laterality Date  ? CESAREAN SECTION    ? TWO of them  ? dental implants N/A 2016  ? lower 2 teeth  ? HYSTEROSCOPY WITH D & C  01/30/2017  ? Procedure: DILATATION AND CURETTAGE /HYSTEROSCOPY;  Surgeon: Sanjuana Kava, MD;  Location: [facility];  Service: Gynecology;;  ? ?Family History:  ?Family History  ?Problem Relation Age of Onset  ? Heart attack Father   ? Hypertension Sister   ?  ?Social History:  reports that she quit smoking about 20 years ago. Her smoking use included cigarettes. She has a 7.50 pack-year smoking history. She has never used smokeless tobacco. She reports that she does not drink alcohol and does not use drugs. ? ?Allergies: ?Allergies  ?Allergen Reactions  ? Lipitor [Atorvastatin]   ?  Muscle aches on Lipitor 20 mg and 40 mg   ? Sulfa Antibiotics Nausea And Vomiting  ? ?  ?Current Medications at time of  admission:  ?Prior to Admission medications   ?Medication Sig Start Date End Date Taking? Authorizing Provider  ?ADDERALL XR 30 MG 24 hr capsule Take 30 mg by mouth daily. 09/23/21  Yes [provider]  ?citalopram (CELEXA) 20 MG tablet Take 20 mg by mouth daily.   Yes [provider]  ?CRESTOR 5 MG tablet TAKE 1 TABLET BY MOUTH EVERY DAY 04/17/15  Yes Dorothy Spark, MD  ?fluticasone (FLONASE) 50 MCG/ACT nasal spray Place 1 spray into both nostrils daily as needed for allergies or rhinitis. ?Patient not taking: Reported on 10/17/2021    [provider]  ?HYDROcodone-acetaminophen (NORCO/VICODIN) 5-325 MG tablet Take 1-2 tablets by mouth every 4 (four) hours as needed for moderate pain or severe pain. ?Patient not taking: Reported on 10/17/2021 01/30/17   Sanjuana Kava, MD  ?ibuprofen (ADVIL,MOTRIN) 800 MG tablet Take 1 tablet (800 mg total) by mouth every 8 (eight) hours as needed for cramping. ?Patient not taking: Reported on 10/17/2021 01/30/17   Sanjuana Kava, MD  ?mometasone (NASONEX) 50 MCG/ACT nasal spray Place 2 sprays into the nose daily as needed (allergies).    [provider]  ? ? ?Review of Systems: ?Constitutional: Negative   ?HENT: Negative   ?Eyes: Negative   ?Respiratory: Negative   ?Cardiovascular: Negative   ?Gastrointestinal: Negative  ?Genitourinary: negative today for vaginal bleeding   ?Musculoskeletal: Negative   ?Skin: Negative   ?Neurological: Negative   ?Endo/Heme/Allergies: Negative   ?  Psychiatric/Behavioral: Negative  ?  ?  ?Objective:  ? ?  ?Physical Exam: ?VS: Blood pressure (!) 171/75, pulse 90, temperature 98.8 ?F (37.1 ?C), temperature source Oral, resp. rate 18, height '5\' 1"'$  (1.549 m), weight 86.2 kg, last menstrual period 10/07/2021, SpO2 96 %. ?Physical Exam ?General:   alert, cooperative, and no distress  ?Skin:   normal  ?Lungs:   clear to auscultation bilaterally  ?Heart:   regular rate and rhythm, S1, S2 normal, no murmur, click, rub or gallop   ?Abdomen:  soft, non-tender; bowel sounds normal; no masses,  no organomegaly  ?Pelvis:  Exam deferred.  ?Uterus: Previously done in office: enlarged 12 wks fibroids palpable  ?Adnexa:   ?Cervix:   ? ?Labs / Imaging: ?Results for orders placed or performed during the hospital encounter of 10/25/21 (from the past 24 hour(s))  ?Pregnancy, urine POC     Status: None  ? Collection Time: 10/25/21  7:25 AM  ?Result Value Ref Range  ? Preg Test, Ur NEGATIVE NEGATIVE  ? ?No results found. ?  ? ?Assessment:  ?  ?    Patient is a 56 y.o. y.o with diagnosis of  symptomatic fibroid uterus on call for TLH/ BS   ?  ?Plan:  ? ? I had a lengthy discussion with the patient regarding her diagnosis. She was counseled about the procedure, risks, reasons, benefits and complications to include: injury to bowel, bladder, major blood vessel, ureter, bleeding, possibility of transfusion, infection, abnormal scar formation and the possibility of diagnosis of malignancy requiring further medical or surgical treatment. All the above was reviewed in detail.  ?All inquiries made by patient were answered.  ?Consent was signed, witnessed and placed into chart. Post op Instructions were reviewed, including office follow up.  ?  ? ? ?Sanjuana Kava MD ?10/25/2021, 9:01 AM  ?

## 2021-10-25 NOTE — Progress Notes (Signed)
MD Preoperative Note ? ?Procedure:  Exam under anesthesia, total abdominal hysterectomy, bilateral salpingectomy , possible cystoscopy, and other indicated procedures/biopsies. ? ?Indication: Symptomatic fibroid uterus ? ?Alternatives:  oral hormonal therapy, Depo Lupron, Depo Provera, Nexplanon, intrauterine devices, patches, rings, watchful waiting, Motrin or other pain management, uterine artery embolization, endometrial ablation ? ?Counseling:  ?The patient was counseled as to indications, alternatives, risks and benefits of planned procedure.  Risk include, but are not limited to infection, blood clots in the veins and lungs (pulmonary embolus), allergic reactions, hemorrhage requiring possible transfusion, and even death.  The patient understands that this procedure will cause permanent sterility (inability to become pregnant). Possible need for open incision (laparotomy) to complete the procedure or in event of injury. There is a possibility of injury to the genital tract, bowel-requiring a colostomy for repair, bladder requiring prolonged catheterization, ureters requiring stent placement, or other pelvic/abdominal organs, the vessels or nerves.  Possibility of intestinal obstruction.  Possible need for further medical or surgical therapy as indicated.  Possible wound breakdown.  Possibility of non-cure of symptoms or return of symptoms at a later date.  Possible hernia formation at the sites of abdominal surgical wounds.  Possible less than satisfactory healing of scars (unsightly/tender).  Remote possibility of urinary and/or fecal incontinence (uncontrolled urine or feces leakage due to fistula formation from bladder and/or bowel to vagina) after the procedure.  Possible diagnosis of malignancy with the need for further medical and/or surgical therapy.  Possible need for life-long hormonal replacement therapy. The patient understands the procedure and its attendant risks, and gives informed consent to  proceed.  Consent signed, witnessed and placed into chart. The patient was given the opportunity to ask questions, and her concerns were adequately addressed. ? ?Sanjuana Kava MD ?9:05 AM  ?

## 2021-10-26 ENCOUNTER — Encounter (HOSPITAL_COMMUNITY): Payer: Self-pay | Admitting: Obstetrics & Gynecology

## 2021-10-26 LAB — CBC
HCT: 34.6 % — ABNORMAL LOW (ref 36.0–46.0)
Hemoglobin: 10.9 g/dL — ABNORMAL LOW (ref 12.0–15.0)
MCH: 26.5 pg (ref 26.0–34.0)
MCHC: 31.5 g/dL (ref 30.0–36.0)
MCV: 84 fL (ref 80.0–100.0)
Platelets: 278 10*3/uL (ref 150–400)
RBC: 4.12 MIL/uL (ref 3.87–5.11)
RDW: 14.3 % (ref 11.5–15.5)
WBC: 11 10*3/uL — ABNORMAL HIGH (ref 4.0–10.5)
nRBC: 0 % (ref 0.0–0.2)

## 2021-10-26 MED ORDER — IBUPROFEN 600 MG PO TABS
600.0000 mg | ORAL_TABLET | Freq: Four times a day (QID) | ORAL | Status: DC
  Administered 2021-10-26 – 2021-10-28 (×7): 600 mg via ORAL
  Filled 2021-10-26 (×7): qty 1

## 2021-10-26 MED ORDER — OXYCODONE-ACETAMINOPHEN 5-325 MG PO TABS: 2.0000 | ORAL_TABLET | Freq: Four times a day (QID) | ORAL | Status: DC

## 2021-10-26 MED ORDER — OXYCODONE-ACETAMINOPHEN 5-325 MG PO TABS: 1.0000 | ORAL_TABLET | ORAL | Status: DC | PRN

## 2021-10-26 NOTE — Progress Notes (Signed)
MD POST OP PROGRESS NOTE ? ?Michele [name] is a32 y.o.  ?[id number] ? ?Post Op Date # 1 ? ?Subjective: ?Patient is Doing well postoperatively. ?Patient has Pain is controlled with current analgesics. Medications being used: Toradol and Dilaudid PCA. ?Diet:regular diet ?Patient denies chest pain, shortness of breath, no nausea or vomiting.  ?She has been out of bed to chair. Foley was just removed, therefore has not voided yet.  ? ?Objective: ?Vital signs in last 24 hours: ?Temp:  [97 ?F (36.1 ?C)-99 ?F (37.2 ?C)] 99 ?F (37.2 ?C) (03/18 0741) ?Pulse Rate:  [70-93] 82 (03/18 0741) ?Resp:  [10-17] 16 (03/18 0741) ?BP: (102-129)/(53-94) 109/53 (03/18 0741) ?SpO2:  [92 %-100 %] 100 % (03/18 0805) ? ?Intake/Output from previous day: ?03/17 0701 - 03/18 0700 ?In: 2220 [P.O.:120; I.V.:2000] ?Out: 1500 [Urine:1200] ?Intake/Output this shift: ?No intake/output data recorded. ?Recent Labs  ?Lab 10/22/21 ?6387 10/26/21 ?5643  ?WBC 5.8 11.0*  ?HGB 13.2 10.9*  ?HCT 42.5 34.6*  ?PLT 317 278  ? ?  ?Recent Labs  ?Lab 10/22/21 ?3295  ?NA 138  ?K 4.1  ?CL 106  ?CO2 26  ?BUN 16  ?CREATININE 0.71  ?CALCIUM 9.0  ?GLUCOSE 129*  ?  ?EXAM: ?General: alert, cooperative, and no distress ?GI: soft, non-tender; bowel sounds normal; no masses,  no organomegaly and incision: clean, dry, intact, and pressure dressing removed ?Extremities: extremities normal, atraumatic, no cyanosis or edema and SCD's on and working ?Vaginal Bleeding: none ? ? ?Assessment: ?POD#1 s/p diagnostic laparoscopy / TAH / BS / Cystoscopy doing well ? ?Plan: ?Encourage ambulation ?Advance to PO medication ?Discontinue IV fluids ?Anticipate discharge tomorrow ? LOS: 1 day  ? ? ?Michele Chevalier,MD 10/26/2021 8:30 AM  ?

## 2021-10-26 NOTE — Op Note (Signed)
OPERATIVE NOTE  ? ?Lynn Ito Hudson ?DOB: Apr 08, 1966  ?[id number] ? ?Date of Surgery: 10/25/2021 ? ?Preoperative Diagnosis:  ?Large fibroid uterus ?Abnormal uterine bleeding ? ?Postoperative Diagnosis:  ?Same as above  ? ?Procedure(s):  ?Diagnostic Laparoscopy ?Exploratory Laparotomy ?Total Abdominal hysterectomy with bilateral salpingectomy ?Urethrocystoscopy ? ?Surgeon:  ?Mady Haagensen. Alwyn Pea, M.D.  ? ?Assistant:  ?Everett Graff, M.D. ? ?Anesthesia:  ?General Endotracheal  ? ?Fluids: 1800 mL Lactated Ringers  ? ?Urine output: 125 mL clear urine ? ?EBL: 300 mL  ? ?Catheter: Foley during case  ? ?Complications: none.  ? ?Medications: Ancef 2GM IV given preoperatively ? ?Specimen: Uterus, cervix, remnant of both fallopian tubes, fibroid ? ?Indication: This is a 56 year-old female with a history of longstanding enlarged fibroid uterus. On ultrasound, there was a large pedunculated fundal fibroid. Several hormonal therapies were tried in the past to aid with her abnormal bleeding. Patient with no history of cervical dysplasia and endometrial biopsy was negative for hyperplasia or malignancy.  ? ?Findings:  Examination under anesthesia: Normal external vulva normal vaginal vault, normal cervix, Uterus: the uterus is enlarged approximately 14 to 16 weeks size with fibroids palpated. mobility: minimal, midline position Adnexa: difficult to palpate due to size of uterus. ? ?Laparoscopy: Enlarged uterus with approximately 8-10 cm fundal fibroid obscuring the visualization of the anterior cul de sac. Fibroid breadth was wide and also obscuring the lateral pelvic side wall on the right side.  ? ?Laparotomy:The uterus with large 10 cm pedunculated fundal fibroid. There was some adhesions along the right infundibulopelvic ligament and as well as to the right infundibulopelvic ligament. ?The bilateral tubes and ovaries appeared normal.  ? ?Cystoscopy: Normal bladder urothelium without injury. Brisk jets from both ureteral  orifices ? ?Procedure:  ?After consent was obtained, the patient was taken to the operating room. She was placed in a low dorsal lithotomy position using Allen stirrups.  ?General anesthesia was administered. An examination under anesthesia was performed and findings noted above. The patient's abdomen was prepped with DuraPrep. The perineum and vagina were prepped with multiple layers of Betadine. A patient safety Time-out was performed.  weighted speculum was placed in the posterior vagina and the anterior vagina was retracted with a Deaver retractor. The anterior lip of the cervix was held with a single tooth tenaculum. The uterus was sounded to 11 cm  and dilated with Kennon Rounds dilators. The tenaculum was removed and the anterior lip of the cervix held with a stitch of 0-vicryl. 3 cm Advincula uterine manipulator was placed and the intrauterine balloon inflated. The  Speculum was removed and the bladder catheterized with a foley.  The patient was sterilely draped.  An incision was made in the umbilicus with 11 blade scalpel after infiltration with 0.25% Marcaine. A 12 mm Excel Visiport trocar was used with l39m 0 degree laparoscope attached to perform direct entry into the patient's abdomen. Direct video visualization confirmed entry into the abdomen and pneumoperitoneum was obtained using approximately 3L CO2 gas. The patient was placed in steep Trendelenburg position.  There was no injury noted with placement of trocars. The pelvic contents were visualized and findings noted above. For patient safety the decision was made to convert to an open procedure. The umbilical  port was removed under direct laparoscopic guidance and  the pneumoperitoneum was released. The patient position was made level.  ? ? ?The patient was re-prepped and draped and a second Time-out performed.  A Pfannenstiel skin incision was made along previous cesarean section scar and  carried to the underlying fascia using a 10 blade scalpel. The  fascia was incised in midline and the incision extended laterally using Mayo scissors. The superior aspect of the fascial incision was grasped with Kocher clamps, tented up, and dissected off the underlying rectus muscle both bluntly and sharply with Mayo scissors. Attention was then turned to the inferior aspect of the incision, which in a similar fashion was grasped with Kocher clamps, tented up and dissected off the underlying rectus muscles.  ?The rectus muscles were separated in the midline and the peritoneum was identified, grasped with hemostat, and entered sharply with Metzenbaum scissors. This incision was extended superiorly and inferiorly with good visualization of the bladder. The uterus was then grasped with two long single toothed tenaculum.brought up out of the incision.The self-retaining Alexis retractor was then placed. The bowel was gently packed with moist laparotomy sponges and held in place with the ribbon malleable retractor. ? ?Prior to starting the hysterectomy, the large pedunculated fibroid along the right fundal portion of the uterus was injected with Vasopressin (diluted as 40 units in 100 cc of water) and removed using the Bovie. The fibroid was handed off the field to be sent with the rest of the specimen to Pathology. Once the debulking of the uterus was felt appropriate to proceed with the hysterectomy. ? ?The round ligaments were suture ligated with 0-vicryl bilaterally and incised with the bovie.  The round ligament pedicle sutures were left long and held with hemostats. The bovie was then used to incise a transverse curvilinear incision in the vesico-cervical fascia. The Bladder flap was created and the bladder mobilized downward. The ureters were visualized bilaterally retroperitoneally coursing well below the operative field. ? ?An avascular area of the broad ligament was then identified and entered bluntly. A curved Heaney clamp was placed over the right uteroovarian ligament. A  Kelly clamp was placed proximal to the Heaney clamp and the ligament transected using Mayo scissors. The pedicle was then secured with a free tie and then suture ligation with #0 Vicryl. The vesicouterine peritoneum was dissected further using Metzenbaum scissors. To ensure that the bladder was below the level of the cervix, the bladder flap was gently pushed down bluntly using a moist Ray-Tec on a sponge stick.  ? ?The right uterine arteries then were skeletonized with Metzenbaum scissors and clamped bilaterally using a curved and straight Heaney clamp. It was transected with Mayo scissors and suture ligated with #0 Vicryl.  The above procedures were performed on the left side by the first assist surgeon to include transection of the round ligament, the left uteroovarian ligament and the left uterine arteries.  ? ?Straight Heaney clamps were then placed on either side of the cervix. The uterosacral cardinal ligament complex was transected with a long scalpel and secured with 0-vicryl. The This was done bilaterally.  The remaining cardinal complex was sequentially clamped with straight Heaney clamps, transected and suture ligated with #0 Vicryl until the bottom of the cervix was reached. At the level of the reflection of the vagina onto the cervix, two curved heaneys were placed and the cervix removed with the mayo scissors.  The cuff angles were then secured with a Heaney stitch of #0 Vicryl tagged with a hemostat and left long. The remaining vaginal cuff was then reapproximated with #0 Vicryl in a running locked fashion and the pelvis was copiously irrigated. ? ?A sponge stick was used to assess any bleeding along the vaginal vault and there was no appreciable  bleeding. There was a small area of bleeding noted on the underside of the bladder. The bladder peritoneum was tented up using Debakey forceps and cauterized with the Bovie. There was oozing of the right adnexal area so it was re-peritonealized with 0-Vicryl  with a figure-of-eight suture Excellent hemostasis noted at this point. The right salpinx was clamped with a kelly clamp and transected off the ovary. The pedicle was suture ligated with 0- vicryl. The left f

## 2021-10-26 NOTE — Progress Notes (Signed)
Witnessed 26 mg. Waste from PCA Dilaudid syringe by Vonzella Nipple, RN. ?

## 2021-10-27 MED ORDER — SIMETHICONE 80 MG PO CHEW
80.0000 mg | CHEWABLE_TABLET | Freq: Four times a day (QID) | ORAL | Status: DC | PRN
  Administered 2021-10-27: 80 mg via ORAL
  Filled 2021-10-27: qty 1

## 2021-10-27 MED ORDER — OXYCODONE-ACETAMINOPHEN 5-325 MG PO TABS
2.0000 | ORAL_TABLET | Freq: Four times a day (QID) | ORAL | Status: DC | PRN
  Administered 2021-10-27: 2 via ORAL
  Filled 2021-10-27: qty 2

## 2021-10-27 NOTE — Progress Notes (Addendum)
?  POD# 2 ?Procedure(s):  ?Diagnostic Laparoscopy ?Exploratory Laparotomy ?Total Abdominal hysterectomy with bilateral salpingectomy ?Urethrocystoscopy ? ?Subjective: ? ?Reports feeling very sleepy and having pain in the right shoulder ?Reports tolerating PO and denies N/V, foley removed, ambulating and urinating w/o difficulty  ?Pain controlled with  PO meds and heat ?Denies HA/SOB/dizziness  ?Flatus passing ? ?Objective: ? VS:  ?Vitals:  ? 10/27/21 0008 10/27/21 0410 10/27/21 0800 10/27/21 1143  ?BP: 124/76 115/61 (!) 102/59 112/62  ?Pulse: 86 80 82 98  ?Resp: '18 16 16 18  '$ ?Temp: 98.3 ?F (36.8 ?C) 98.9 ?F (37.2 ?C) 97.9 ?F (36.6 ?C) 98 ?F (36.7 ?C)  ?TempSrc: Oral Oral Oral Oral  ?SpO2: 99% 100% 95% 99%  ?Weight:      ?Height:      ? ? ?Intake/Output Summary (Last 24 hours) at 10/27/2021 1146 ?Last data filed at 10/27/2021 1144 ?Gross per 24 hour  ?Intake 240 ml  ?Output 600 ml  ?Net -360 ml  ?   ?Recent Labs  ?  10/26/21 ?0412  ?WBC 11.0*  ?HGB 10.9*  ?HCT 34.6*  ?PLT 278  ? ? ?Physical Exam:  ?General: alert, cooperative, no distress, and moderate distress ?CV: Regular rate and rhythm ?Resp: clear and unlabored ?Abdomen: soft, nontender, normal bowel sounds ?Incision: clean, dry, and steristrips to lower abd ?Ext: trace edema, redness or tenderness in the calves or thighs, SCD's on while in bed ? ? ?Assessment/Plan: ?56 y.o.   POD# 2.                 ?Principal Problem: ?  Abnormal uterine and vaginal bleeding, unspecified ?Active Problems: ?  S/P TAH (total abdominal hysterectomy) ?Stable ? ?K-pad for right shoulder pain PRN ?Mylicon 80 mg PO Q6 hr PRN         ?Advance diet as tolerated ?Advised warm fluids and ambulation to improve GI motility ?Anticipate D/C 10/28/21 ? ?Dr. Alwyn Pea notified of exam and status ? ?Arrie Eastern, MSN, CNM ?10/27/2021, 11:46 AM ? ? ?

## 2021-10-28 ENCOUNTER — Other Ambulatory Visit (HOSPITAL_COMMUNITY): Payer: Self-pay

## 2021-10-28 LAB — SURGICAL PATHOLOGY

## 2021-10-28 MED ORDER — IBUPROFEN 600 MG PO TABS
600.0000 mg | ORAL_TABLET | Freq: Four times a day (QID) | ORAL | 3 refills | Status: DC | PRN
Start: 2021-10-28 — End: 2022-01-16
  Filled 2021-10-28: qty 60, 15d supply, fill #0

## 2021-10-28 MED ORDER — OXYCODONE-ACETAMINOPHEN 5-325 MG PO TABS
1.0000 | ORAL_TABLET | ORAL | 0 refills | Status: DC | PRN
  Filled 2021-10-28: qty 45, 4d supply, fill #0

## 2021-10-28 NOTE — Discharge Summary (Signed)
Physician Discharge Summary  ?Patient ID: ?Michele [name] ?MRN: [id number] ?DOB/AGE: 08/17/1965 56 y.o. ? ?Admit date: 10/25/2021 ?Discharge date: 10/28/2021 ? ? ?Discharge Diagnoses:  ?Principal Problem: ?  Abnormal uterine and vaginal bleeding, unspecified ?Active Problems: ?  S/P TAH (total abdominal hysterectomy) ? ? ?Operation:  Diagnostic laparoscopy, Total abdominal hysterectomy, bilateral salpingectomy, cystoscopy ? ? ?Discharged Condition: good ? ?Hospital Course: Patient taken to operating room where above procedures were performed.  There were no intraoperative or post operative complications.  Patient progressed well in the hospital and on POD#3 was meeting all discharge criteria and was discharged home.   ? ?Disposition:  ? ?Discharge Medications:  ?Allergies as of 10/28/2021   ? ?   Reactions  ? Lipitor [atorvastatin]   ? Muscle aches on Lipitor 20 mg and 40 mg   ? Sulfa Antibiotics Nausea And Vomiting  ? ?  ? ?  ?Medication List  ?  ? ?STOP taking these medications   ? ?HYDROcodone-acetaminophen 5-325 MG tablet ?Commonly known as: NORCO/VICODIN ?  ? ?  ? ?TAKE these medications   ? ?Adderall XR 30 MG 24 hr capsule ?Generic drug: amphetamine-dextroamphetamine ?Take 30 mg by mouth daily. ?  ?citalopram 20 MG tablet ?Commonly known as: CELEXA ?Take 20 mg by mouth daily. ?  ?Crestor 5 MG tablet ?Generic drug: rosuvastatin ?TAKE 1 TABLET BY MOUTH EVERY DAY ?  ?fluticasone 50 MCG/ACT nasal spray ?Commonly known as: FLONASE ?Place 1 spray into both nostrils daily as needed for allergies or rhinitis. ?  ?ibuprofen 600 MG tablet ?Commonly known as: ADVIL ?Take 1 tablet (600 mg total) by mouth every 6 (six) hours as needed for cramping or moderate pain. ?What changed:  ?medication strength ?how much to take ?when to take this ?reasons to take this ?  ?mometasone 50 MCG/ACT nasal spray ?Commonly known as: NASONEX ?Place 2 sprays into the nose daily as needed (allergies). ?  ?oxyCODONE-acetaminophen 5-325 MG  tablet ?Commonly known as: PERCOCET/ROXICET ?Take 1-2 tablets by mouth every 4 (four) hours as needed for severe pain or moderate pain. ?  ? ?  ?  ? ?Discharge Instructions: No heavy lifting, no driving for 2-37 days.  ? ?Follow-up: 1 week for post op incision check ? ?Final Pathology: pending ? ? ?Signed: ?Sanjuana Kava MD ?10/28/2021, 9:16 AM ?  ?

## 2021-11-05 ENCOUNTER — Inpatient Hospital Stay (HOSPITAL_COMMUNITY): Payer: BC Managed Care – PPO | Admitting: Anesthesiology

## 2021-11-05 ENCOUNTER — Inpatient Hospital Stay (HOSPITAL_COMMUNITY)
Admission: EM | Admit: 2021-11-05 | Discharge: 2021-11-29 | DRG: 853 | Disposition: A | Discharge status: Home Health | Payer: BC Managed Care – PPO | Source: Ambulatory Visit | Attending: General Surgery | Admitting: General Surgery

## 2021-11-05 ENCOUNTER — Emergency Department (HOSPITAL_COMMUNITY): Payer: BC Managed Care – PPO

## 2021-11-05 ENCOUNTER — Other Ambulatory Visit: Payer: Self-pay

## 2021-11-05 ENCOUNTER — Encounter (HOSPITAL_COMMUNITY): Payer: Self-pay | Admitting: Obstetrics & Gynecology

## 2021-11-05 ENCOUNTER — Encounter (HOSPITAL_COMMUNITY): Admission: EM | Disposition: A | Discharge status: Home Health | Payer: Self-pay | Source: Ambulatory Visit

## 2021-11-05 DIAGNOSIS — R0789 Other chest pain: Secondary | ICD-10-CM | POA: Diagnosis not present

## 2021-11-05 DIAGNOSIS — K567 Ileus, unspecified: Secondary | ICD-10-CM | POA: Diagnosis present

## 2021-11-05 DIAGNOSIS — F419 Anxiety disorder, unspecified: Secondary | ICD-10-CM | POA: Diagnosis present

## 2021-11-05 DIAGNOSIS — T8132XA Disruption of internal operation (surgical) wound, not elsewhere classified, initial encounter: Secondary | ICD-10-CM | POA: Diagnosis not present

## 2021-11-05 DIAGNOSIS — K631 Perforation of intestine (nontraumatic): Secondary | ICD-10-CM | POA: Diagnosis present

## 2021-11-05 DIAGNOSIS — Y832 Surgical operation with anastomosis, bypass or graft as the cause of abnormal reaction of the patient, or of later complication, without mention of misadventure at the time of the procedure: Secondary | ICD-10-CM | POA: Diagnosis not present

## 2021-11-05 DIAGNOSIS — J9811 Atelectasis: Secondary | ICD-10-CM | POA: Diagnosis not present

## 2021-11-05 DIAGNOSIS — K651 Peritoneal abscess: Secondary | ICD-10-CM | POA: Diagnosis present

## 2021-11-05 DIAGNOSIS — A4151 Sepsis due to Escherichia coli [E. coli]: Secondary | ICD-10-CM | POA: Diagnosis not present

## 2021-11-05 DIAGNOSIS — E876 Hypokalemia: Secondary | ICD-10-CM | POA: Diagnosis not present

## 2021-11-05 DIAGNOSIS — R778 Other specified abnormalities of plasma proteins: Secondary | ICD-10-CM | POA: Diagnosis not present

## 2021-11-05 DIAGNOSIS — E861 Hypovolemia: Secondary | ICD-10-CM | POA: Diagnosis present

## 2021-11-05 DIAGNOSIS — Z87891 Personal history of nicotine dependence: Secondary | ICD-10-CM

## 2021-11-05 DIAGNOSIS — R918 Other nonspecific abnormal finding of lung field: Secondary | ICD-10-CM | POA: Diagnosis not present

## 2021-11-05 DIAGNOSIS — I10 Essential (primary) hypertension: Secondary | ICD-10-CM | POA: Diagnosis not present

## 2021-11-05 DIAGNOSIS — T8143XA Infection following a procedure, organ and space surgical site, initial encounter: Secondary | ICD-10-CM | POA: Diagnosis not present

## 2021-11-05 DIAGNOSIS — Z888 Allergy status to other drugs, medicaments and biological substances status: Secondary | ICD-10-CM | POA: Diagnosis not present

## 2021-11-05 DIAGNOSIS — I96 Gangrene, not elsewhere classified: Secondary | ICD-10-CM | POA: Diagnosis present

## 2021-11-05 DIAGNOSIS — D509 Iron deficiency anemia, unspecified: Secondary | ICD-10-CM | POA: Diagnosis not present

## 2021-11-05 DIAGNOSIS — R109 Unspecified abdominal pain: Secondary | ICD-10-CM | POA: Diagnosis not present

## 2021-11-05 DIAGNOSIS — D649 Anemia, unspecified: Secondary | ICD-10-CM | POA: Diagnosis not present

## 2021-11-05 DIAGNOSIS — E1165 Type 2 diabetes mellitus with hyperglycemia: Secondary | ICD-10-CM | POA: Diagnosis not present

## 2021-11-05 DIAGNOSIS — R7989 Other specified abnormal findings of blood chemistry: Secondary | ICD-10-CM

## 2021-11-05 DIAGNOSIS — Z90710 Acquired absence of both cervix and uterus: Secondary | ICD-10-CM | POA: Diagnosis not present

## 2021-11-05 DIAGNOSIS — Z79899 Other long term (current) drug therapy: Secondary | ICD-10-CM | POA: Diagnosis not present

## 2021-11-05 DIAGNOSIS — K529 Noninfective gastroenteritis and colitis, unspecified: Secondary | ICD-10-CM | POA: Diagnosis not present

## 2021-11-05 DIAGNOSIS — R188 Other ascites: Secondary | ICD-10-CM | POA: Diagnosis not present

## 2021-11-05 DIAGNOSIS — Z8249 Family history of ischemic heart disease and other diseases of the circulatory system: Secondary | ICD-10-CM | POA: Diagnosis not present

## 2021-11-05 DIAGNOSIS — Z882 Allergy status to sulfonamides status: Secondary | ICD-10-CM | POA: Diagnosis not present

## 2021-11-05 DIAGNOSIS — K5720 Diverticulitis of large intestine with perforation and abscess without bleeding: Secondary | ICD-10-CM | POA: Diagnosis not present

## 2021-11-05 DIAGNOSIS — K9189 Other postprocedural complications and disorders of digestive system: Secondary | ICD-10-CM | POA: Diagnosis not present

## 2021-11-05 DIAGNOSIS — K55049 Acute infarction of large intestine, extent unspecified: Secondary | ICD-10-CM | POA: Diagnosis not present

## 2021-11-05 DIAGNOSIS — K750 Abscess of liver: Secondary | ICD-10-CM | POA: Diagnosis not present

## 2021-11-05 DIAGNOSIS — R1084 Generalized abdominal pain: Secondary | ICD-10-CM | POA: Diagnosis not present

## 2021-11-05 DIAGNOSIS — J982 Interstitial emphysema: Secondary | ICD-10-CM | POA: Diagnosis not present

## 2021-11-05 DIAGNOSIS — K658 Other peritonitis: Secondary | ICD-10-CM | POA: Diagnosis not present

## 2021-11-05 DIAGNOSIS — R0902 Hypoxemia: Secondary | ICD-10-CM | POA: Diagnosis not present

## 2021-11-05 DIAGNOSIS — E785 Hyperlipidemia, unspecified: Secondary | ICD-10-CM | POA: Diagnosis not present

## 2021-11-05 DIAGNOSIS — R0602 Shortness of breath: Secondary | ICD-10-CM | POA: Diagnosis not present

## 2021-11-05 DIAGNOSIS — N179 Acute kidney failure, unspecified: Secondary | ICD-10-CM | POA: Diagnosis not present

## 2021-11-05 DIAGNOSIS — K429 Umbilical hernia without obstruction or gangrene: Secondary | ICD-10-CM | POA: Diagnosis not present

## 2021-11-05 DIAGNOSIS — R079 Chest pain, unspecified: Secondary | ICD-10-CM | POA: Diagnosis not present

## 2021-11-05 DIAGNOSIS — R140 Abdominal distension (gaseous): Secondary | ICD-10-CM | POA: Diagnosis not present

## 2021-11-05 DIAGNOSIS — K668 Other specified disorders of peritoneum: Secondary | ICD-10-CM | POA: Diagnosis not present

## 2021-11-05 DIAGNOSIS — R531 Weakness: Secondary | ICD-10-CM | POA: Diagnosis not present

## 2021-11-05 HISTORY — DX: Perforation of intestine (nontraumatic): K63.1

## 2021-11-05 HISTORY — PX: COLOSTOMY: SHX63

## 2021-11-05 HISTORY — PX: LAPAROTOMY: SHX154

## 2021-11-05 LAB — TYPE AND SCREEN
ABO/RH(D): A NEG
Antibody Screen: NEGATIVE

## 2021-11-05 LAB — CBC WITH DIFFERENTIAL/PLATELET
Abs Immature Granulocytes: 0.59 10*3/uL — ABNORMAL HIGH (ref 0.00–0.07)
Basophils Absolute: 0 10*3/uL (ref 0.0–0.1)
Basophils Relative: 0 %
Eosinophils Absolute: 0 10*3/uL (ref 0.0–0.5)
Eosinophils Relative: 0 %
HCT: 33.2 % — ABNORMAL LOW (ref 36.0–46.0)
Hemoglobin: 11.1 g/dL — ABNORMAL LOW (ref 12.0–15.0)
Immature Granulocytes: 6 %
Lymphocytes Relative: 16 %
Lymphs Abs: 1.6 10*3/uL (ref 0.7–4.0)
MCH: 26.5 pg (ref 26.0–34.0)
MCHC: 33.4 g/dL (ref 30.0–36.0)
MCV: 79.2 fL — ABNORMAL LOW (ref 80.0–100.0)
Monocytes Absolute: 0.6 10*3/uL (ref 0.1–1.0)
Monocytes Relative: 6 %
Neutro Abs: 7.3 10*3/uL (ref 1.7–7.7)
Neutrophils Relative %: 72 %
Platelets: 414 10*3/uL — ABNORMAL HIGH (ref 150–400)
RBC: 4.19 MIL/uL (ref 3.87–5.11)
RDW: 15 % (ref 11.5–15.5)
WBC: 10.2 10*3/uL (ref 4.0–10.5)
nRBC: 0.2 % (ref 0.0–0.2)

## 2021-11-05 LAB — POCT I-STAT 7, (LYTES, BLD GAS, ICA,H+H)
Acid-base deficit: 3 mmol/L — ABNORMAL HIGH (ref 0.0–2.0)
Bicarbonate: 22.9 mmol/L (ref 20.0–28.0)
Calcium, Ion: 1.08 mmol/L — ABNORMAL LOW (ref 1.15–1.40)
HCT: 28 % — ABNORMAL LOW (ref 36.0–46.0)
Hemoglobin: 9.5 g/dL — ABNORMAL LOW (ref 12.0–15.0)
O2 Saturation: 97 %
Potassium: 4.1 mmol/L (ref 3.5–5.1)
Sodium: 132 mmol/L — ABNORMAL LOW (ref 135–145)
TCO2: 24 mmol/L (ref 22–32)
pCO2 arterial: 43 mmHg (ref 32–48)
pH, Arterial: 7.333 — ABNORMAL LOW (ref 7.35–7.45)
pO2, Arterial: 100 mmHg (ref 83–108)

## 2021-11-05 LAB — COMPREHENSIVE METABOLIC PANEL
ALT: 22 U/L (ref 0–44)
AST: 32 U/L (ref 15–41)
Albumin: 1.9 g/dL — ABNORMAL LOW (ref 3.5–5.0)
Alkaline Phosphatase: 138 U/L — ABNORMAL HIGH (ref 38–126)
Anion gap: 16 — ABNORMAL HIGH (ref 5–15)
BUN: 46 mg/dL — ABNORMAL HIGH (ref 6–20)
CO2: 21 mmol/L — ABNORMAL LOW (ref 22–32)
Calcium: 8.1 mg/dL — ABNORMAL LOW (ref 8.9–10.3)
Chloride: 96 mmol/L — ABNORMAL LOW (ref 98–111)
Creatinine, Ser: 2.17 mg/dL — ABNORMAL HIGH (ref 0.44–1.00)
GFR, Estimated: 26 mL/min — ABNORMAL LOW (ref >60)
Glucose, Bld: 151 mg/dL — ABNORMAL HIGH (ref 70–99)
Potassium: 4.8 mmol/L (ref 3.5–5.1)
Sodium: 133 mmol/L — ABNORMAL LOW (ref 135–145)
Total Bilirubin: 0.7 mg/dL (ref 0.3–1.2)
Total Protein: 6 g/dL — ABNORMAL LOW (ref 6.5–8.1)

## 2021-11-05 LAB — MRSA NEXT GEN BY PCR, NASAL: MRSA by PCR Next Gen: NOT DETECTED

## 2021-11-05 LAB — TROPONIN I (HIGH SENSITIVITY)
Troponin I (High Sensitivity): 198 ng/L (ref <18)
Troponin I (High Sensitivity): 29 ng/L — ABNORMAL HIGH (ref <18)

## 2021-11-05 LAB — LIPASE, BLOOD: Lipase: 21 U/L (ref 11–51)

## 2021-11-05 LAB — GLUCOSE, CAPILLARY: Glucose-Capillary: 167 mg/dL — ABNORMAL HIGH (ref 70–99)

## 2021-11-05 SURGERY — LAPAROTOMY, EXPLORATORY
Anesthesia: General | Site: Abdomen

## 2021-11-05 MED ORDER — FENTANYL CITRATE (PF) 250 MCG/5ML IJ SOLN
INTRAMUSCULAR | Status: DC | PRN
  Administered 2021-11-05: 100 ug via INTRAVENOUS
  Administered 2021-11-05 (×3): 50 ug via INTRAVENOUS

## 2021-11-05 MED ORDER — ACETAMINOPHEN 10 MG/ML IV SOLN
INTRAVENOUS | Status: AC
  Filled 2021-11-05: qty 100

## 2021-11-05 MED ORDER — ONDANSETRON HCL 4 MG/2ML IJ SOLN: 4.0000 mg | Freq: Once | INTRAMUSCULAR | Status: DC | PRN

## 2021-11-05 MED ORDER — HYDROMORPHONE HCL 1 MG/ML IJ SOLN
0.5000 mg | Freq: Once | INTRAMUSCULAR | Status: AC
  Administered 2021-11-05: 0.5 mg via INTRAVENOUS
  Filled 2021-11-05: qty 1

## 2021-11-05 MED ORDER — SODIUM CHLORIDE 0.9 % IV SOLN: INTRAVENOUS | Status: DC

## 2021-11-05 MED ORDER — HYDROMORPHONE HCL 1 MG/ML IJ SOLN
INTRAMUSCULAR | Status: DC | PRN
  Administered 2021-11-05: .5 mg via INTRAVENOUS

## 2021-11-05 MED ORDER — DEXAMETHASONE SODIUM PHOSPHATE 10 MG/ML IJ SOLN
INTRAMUSCULAR | Status: DC | PRN
  Administered 2021-11-05: 10 mg via INTRAVENOUS

## 2021-11-05 MED ORDER — PROPOFOL 10 MG/ML IV BOLUS
INTRAVENOUS | Status: DC | PRN
  Administered 2021-11-05: 150 mg via INTRAVENOUS

## 2021-11-05 MED ORDER — FENTANYL CITRATE (PF) 100 MCG/2ML IJ SOLN
25.0000 ug | INTRAMUSCULAR | Status: DC | PRN
  Administered 2021-11-06: 50 ug via INTRAVENOUS
  Filled 2021-11-05: qty 2

## 2021-11-05 MED ORDER — FENTANYL CITRATE PF 50 MCG/ML IJ SOSY: 25.0000 ug | PREFILLED_SYRINGE | INTRAMUSCULAR | Status: DC | PRN

## 2021-11-05 MED ORDER — LIDOCAINE 2% (20 MG/ML) 5 ML SYRINGE
INTRAMUSCULAR | Status: DC | PRN
Start: 2021-11-05 — End: 2021-11-05
  Administered 2021-11-05: 40 mg via INTRAVENOUS

## 2021-11-05 MED ORDER — CHLORHEXIDINE GLUCONATE 0.12 % MT SOLN: 15.0000 mL | Freq: Once | OROMUCOSAL | Status: AC

## 2021-11-05 MED ORDER — FENTANYL CITRATE (PF) 100 MCG/2ML IJ SOLN
INTRAMUSCULAR | Status: AC
  Filled 2021-11-05: qty 2

## 2021-11-05 MED ORDER — LACTATED RINGERS IV SOLN: INTRAVENOUS | Status: DC

## 2021-11-05 MED ORDER — SUGAMMADEX SODIUM 200 MG/2ML IV SOLN
INTRAVENOUS | Status: DC | PRN
  Administered 2021-11-05: 354 mg via INTRAVENOUS

## 2021-11-05 MED ORDER — ALBUMIN HUMAN 5 % IV SOLN: INTRAVENOUS | Status: DC | PRN

## 2021-11-05 MED ORDER — ROCURONIUM BROMIDE 10 MG/ML (PF) SYRINGE
PREFILLED_SYRINGE | INTRAVENOUS | Status: DC | PRN
  Administered 2021-11-05: 100 mg via INTRAVENOUS

## 2021-11-05 MED ORDER — FENTANYL CITRATE (PF) 250 MCG/5ML IJ SOLN
INTRAMUSCULAR | Status: AC
  Filled 2021-11-05: qty 5

## 2021-11-05 MED ORDER — MIDAZOLAM HCL 2 MG/2ML IJ SOLN
INTRAMUSCULAR | Status: DC | PRN
  Administered 2021-11-05: 2 mg via INTRAVENOUS

## 2021-11-05 MED ORDER — IOHEXOL 350 MG/ML SOLN
100.0000 mL | Freq: Once | INTRAVENOUS | Status: AC | PRN
  Administered 2021-11-05: 100 mL via INTRAVENOUS

## 2021-11-05 MED ORDER — ORAL CARE MOUTH RINSE: 15.0000 mL | Freq: Once | OROMUCOSAL | Status: AC

## 2021-11-05 MED ORDER — SUCCINYLCHOLINE CHLORIDE 200 MG/10ML IV SOSY
PREFILLED_SYRINGE | INTRAVENOUS | Status: DC | PRN
  Administered 2021-11-05: 120 mg via INTRAVENOUS

## 2021-11-05 MED ORDER — POLYETHYLENE GLYCOL 3350 17 G PO PACK: 17.0000 g | PACK | Freq: Every day | ORAL | Status: DC | PRN

## 2021-11-05 MED ORDER — SODIUM CHLORIDE 0.9% FLUSH
10.0000 mL | INTRAVENOUS | Status: DC | PRN
  Administered 2021-11-26: 10 mL

## 2021-11-05 MED ORDER — SUCCINYLCHOLINE CHLORIDE 200 MG/10ML IV SOSY
PREFILLED_SYRINGE | INTRAVENOUS | Status: AC
  Filled 2021-11-05: qty 10

## 2021-11-05 MED ORDER — OXYCODONE HCL 5 MG/5ML PO SOLN: 5.0000 mg | Freq: Once | ORAL | Status: DC | PRN

## 2021-11-05 MED ORDER — PIPERACILLIN-TAZOBACTAM 3.375 G IVPB
3.3750 g | Freq: Three times a day (TID) | INTRAVENOUS | Status: DC
Start: 2021-11-05 — End: 2021-11-17
  Administered 2021-11-05 – 2021-11-17 (×35): 3.375 g via INTRAVENOUS
  Filled 2021-11-05 (×34): qty 50

## 2021-11-05 MED ORDER — KETOROLAC TROMETHAMINE 15 MG/ML IJ SOLN
INTRAMUSCULAR | Status: DC | PRN
  Administered 2021-11-05: 15 mg via INTRAVENOUS

## 2021-11-05 MED ORDER — FENTANYL CITRATE (PF) 100 MCG/2ML IJ SOLN
25.0000 ug | INTRAMUSCULAR | Status: DC | PRN
  Administered 2021-11-05 (×3): 50 ug via INTRAVENOUS

## 2021-11-05 MED ORDER — ONDANSETRON HCL 4 MG/2ML IJ SOLN
INTRAMUSCULAR | Status: DC | PRN
  Administered 2021-11-05: 8 mg via INTRAVENOUS

## 2021-11-05 MED ORDER — OXYCODONE HCL 5 MG PO TABS: 5.0000 mg | ORAL_TABLET | Freq: Once | ORAL | Status: DC | PRN

## 2021-11-05 MED ORDER — CHLORHEXIDINE GLUCONATE 0.12 % MT SOLN
OROMUCOSAL | Status: AC
  Administered 2021-11-05: 15 mL via OROMUCOSAL
  Filled 2021-11-05: qty 15

## 2021-11-05 MED ORDER — ACETAMINOPHEN 10 MG/ML IV SOLN
INTRAVENOUS | Status: DC | PRN
  Administered 2021-11-05: 1000 mg via INTRAVENOUS

## 2021-11-05 MED ORDER — ONDANSETRON HCL 4 MG/2ML IJ SOLN
4.0000 mg | Freq: Once | INTRAMUSCULAR | Status: AC
  Administered 2021-11-05: 4 mg via INTRAVENOUS
  Filled 2021-11-05: qty 2

## 2021-11-05 MED ORDER — ONDANSETRON HCL 4 MG/2ML IJ SOLN
INTRAMUSCULAR | Status: AC
  Filled 2021-11-05: qty 2

## 2021-11-05 MED ORDER — AMISULPRIDE (ANTIEMETIC) 5 MG/2ML IV SOLN: 10.0000 mg | Freq: Once | INTRAVENOUS | Status: DC | PRN

## 2021-11-05 MED ORDER — SODIUM CHLORIDE 0.9% FLUSH
10.0000 mL | Freq: Two times a day (BID) | INTRAVENOUS | Status: DC
  Administered 2021-11-05 – 2021-11-29 (×33): 10 mL

## 2021-11-05 MED ORDER — PROPOFOL 10 MG/ML IV BOLUS
INTRAVENOUS | Status: AC
  Filled 2021-11-05: qty 20

## 2021-11-05 MED ORDER — MIDAZOLAM HCL 2 MG/2ML IJ SOLN
INTRAMUSCULAR | Status: AC
  Filled 2021-11-05: qty 2

## 2021-11-05 MED ORDER — ORAL CARE MOUTH RINSE
15.0000 mL | Freq: Two times a day (BID) | OROMUCOSAL | Status: DC
  Administered 2021-11-05 – 2021-11-29 (×33): 15 mL via OROMUCOSAL

## 2021-11-05 MED ORDER — CHLORHEXIDINE GLUCONATE CLOTH 2 % EX PADS
6.0000 | MEDICATED_PAD | Freq: Every day | CUTANEOUS | Status: DC
  Administered 2021-11-05 – 2021-11-07 (×3): 6 via TOPICAL

## 2021-11-05 MED ORDER — PIPERACILLIN-TAZOBACTAM 3.375 G IVPB 30 MIN
3.3750 g | Freq: Once | INTRAVENOUS | Status: AC
Start: 2021-11-05 — End: 2021-11-05
  Administered 2021-11-05: 3.375 g via INTRAVENOUS
  Filled 2021-11-05: qty 50

## 2021-11-05 MED ORDER — LIDOCAINE 2% (20 MG/ML) 5 ML SYRINGE
INTRAMUSCULAR | Status: AC
  Filled 2021-11-05: qty 5

## 2021-11-05 MED ORDER — SODIUM CHLORIDE 0.9 % IV SOLN: INTRAVENOUS | Status: DC | PRN

## 2021-11-05 MED ORDER — 0.9 % SODIUM CHLORIDE (POUR BTL) OPTIME
TOPICAL | Status: DC | PRN
  Administered 2021-11-05 (×7): 1000 mL

## 2021-11-05 MED ORDER — DEXAMETHASONE SODIUM PHOSPHATE 10 MG/ML IJ SOLN
INTRAMUSCULAR | Status: AC
  Filled 2021-11-05: qty 1

## 2021-11-05 MED ORDER — HYDROMORPHONE HCL 1 MG/ML IJ SOLN
INTRAMUSCULAR | Status: AC
  Filled 2021-11-05: qty 0.5

## 2021-11-05 MED ORDER — DOCUSATE SODIUM 100 MG PO CAPS: 100.0000 mg | ORAL_CAPSULE | Freq: Two times a day (BID) | ORAL | Status: DC | PRN

## 2021-11-05 MED ORDER — CALCIUM GLUCONATE-NACL 1-0.675 GM/50ML-% IV SOLN
1.0000 g | Freq: Once | INTRAVENOUS | Status: AC
  Administered 2021-11-05: 1000 mg via INTRAVENOUS
  Filled 2021-11-05: qty 50

## 2021-11-05 SURGICAL SUPPLY — 57 items
ADH SKN CLS APL DERMABOND .7 (GAUZE/BANDAGES/DRESSINGS) ×2
BAG COUNTER SPONGE SURGICOUNT (BAG) ×3 IMPLANT
BAG SPNG CNTER NS LX DISP (BAG) ×2
BLADE SURG 10 STRL SS (BLADE) ×6 IMPLANT
BNDG GAUZE ELAST 4 BULKY (GAUZE/BANDAGES/DRESSINGS) ×1 IMPLANT
CANISTER SUCT 3000ML PPV (MISCELLANEOUS) ×3 IMPLANT
COUNTER NEEDLE 20 DBL MAG RED (NEEDLE) ×1 IMPLANT
DECANTER SPIKE VIAL GLASS SM (MISCELLANEOUS) IMPLANT
DERMABOND ADVANCED (GAUZE/BANDAGES/DRESSINGS) ×1
DERMABOND ADVANCED .7 DNX12 (GAUZE/BANDAGES/DRESSINGS) IMPLANT
DRAPE WARM FLUID 44X44 (DRAPES) ×1 IMPLANT
DRSG TEGADERM 4X4.75 (GAUZE/BANDAGES/DRESSINGS) ×2 IMPLANT
DURAPREP 26ML APPLICATOR (WOUND CARE) ×5 IMPLANT
GAUZE 4X4 16PLY ~~LOC~~+RFID DBL (SPONGE) IMPLANT
GAUZE SPONGE 4X4 12PLY STRL (GAUZE/BANDAGES/DRESSINGS) ×1 IMPLANT
GLOVE SURG ENC MOIS LTX SZ7 (GLOVE) ×3 IMPLANT
GLOVE SURG UNDER POLY LF SZ7 (GLOVE) ×3 IMPLANT
GOWN STRL REUS W/ TWL LRG LVL3 (GOWN DISPOSABLE) ×6 IMPLANT
GOWN STRL REUS W/TWL LRG LVL3 (GOWN DISPOSABLE) ×12
HIBICLENS CHG 4% 4OZ BTL (MISCELLANEOUS) ×2 IMPLANT
KIT OSTOMY DRAINABLE 2.75 STR (WOUND CARE) ×1 IMPLANT
KIT TURNOVER KIT B (KITS) ×3 IMPLANT
LIGASURE IMPACT 36 18CM CVD LR (INSTRUMENTS) ×1 IMPLANT
NEEDLE HYPO 22GX1.5 SAFETY (NEEDLE) IMPLANT
PACK ABDOMINAL GYN (CUSTOM PROCEDURE TRAY) ×3 IMPLANT
PAD ABD 8X10 STRL (GAUZE/BANDAGES/DRESSINGS) ×2 IMPLANT
PAD OB MATERNITY 4.3X12.25 (PERSONAL CARE ITEMS) ×3 IMPLANT
PENCIL BUTTON HOLSTER BLD 10FT (ELECTRODE) ×3 IMPLANT
PROTECTOR NERVE ULNAR (MISCELLANEOUS) ×2 IMPLANT
RELOAD PROXIMATE 75MM BLUE (ENDOMECHANICALS) ×6 IMPLANT
RELOAD STAPLE 75 3.8 BLU REG (ENDOMECHANICALS) IMPLANT
SET CYSTO W/LG BORE CLAMP LF (SET/KITS/TRAYS/PACK) ×2 IMPLANT
SPONGE T-LAP 18X18 ~~LOC~~+RFID (SPONGE) ×6 IMPLANT
STAPLER PROXIMATE 75MM BLUE (STAPLE) ×1 IMPLANT
STAPLER VISISTAT 35W (STAPLE) ×3 IMPLANT
SUT CHROMIC 2 0 TIES 18 (SUTURE) IMPLANT
SUT PDS AB 0 CTX 60 (SUTURE) ×4 IMPLANT
SUT PDS AB 1 TP1 96 (SUTURE) ×2 IMPLANT
SUT PLAIN 2 0 (SUTURE)
SUT PLAIN 2 0 XLH (SUTURE) IMPLANT
SUT PLAIN ABS 2-0 54XMFL TIE (SUTURE) IMPLANT
SUT PROLENE 3 0 SH DA (SUTURE) ×1 IMPLANT
SUT SILK 2 0 SH CR/8 (SUTURE) ×1 IMPLANT
SUT SILK 2 0 TIES 10X30 (SUTURE) ×1 IMPLANT
SUT SILK 3 0 SH CR/8 (SUTURE) ×1 IMPLANT
SUT SILK 3 0 TIES 10X30 (SUTURE) ×1 IMPLANT
SUT VIC AB 0 CT1 18XCR BRD8 (SUTURE) ×6 IMPLANT
SUT VIC AB 0 CT1 27 (SUTURE)
SUT VIC AB 0 CT1 27XBRD ANBCTR (SUTURE) ×4 IMPLANT
SUT VIC AB 0 CT1 8-18 (SUTURE)
SUT VIC AB 2-0 CT1 (SUTURE) ×4 IMPLANT
SUT VIC AB 2-0 SH 18 (SUTURE) ×1 IMPLANT
SUT VIC AB 3-0 SH 18 (SUTURE) ×2 IMPLANT
SUT VICRYL 0 TIES 12 18 (SUTURE) ×2 IMPLANT
SYR CONTROL 10ML LL (SYRINGE) IMPLANT
TOWEL GREEN STERILE FF (TOWEL DISPOSABLE) ×6 IMPLANT
TRAY FOLEY W/BAG SLVR 14FR (SET/KITS/TRAYS/PACK) ×3 IMPLANT

## 2021-11-05 NOTE — Op Note (Signed)
?11/05/2021 ? ?6:27 PM ? ?PATIENT:  Michele [name]  56 y.o. female ? ?PRE-OPERATIVE DIAGNOSIS:  Perforated Bowel ? ?POST-OPERATIVE DIAGNOSIS:  Perforated sigmoid colon ? ?PROCEDURE:  Procedure(s): ?EXPLORATORY LAPAROTOMY  ?SIGMOID COLECTOMY ?COLOSTOMY AND HARTMANS ? ?SURGEON:  Georganna Skeans, MD  ? ?ASSISTANTS: Ralene Ok, MD ?Sanjuana Kava, MD  ? ?ANESTHESIA:   general ? ?EBL:  Total I/O ?In: 1500 [I.V.:1000; IV Piggyback:500] ?Out: 100 [Urine:100] ? ?BLOOD ADMINISTERED:none ? ?DRAINS: none  ? ?SPECIMEN:  Excision ? ?DISPOSITION OF SPECIMEN:  PATHOLOGY ? ?COUNTS:  YES ? ?DICTATION: .Dragon Dictation ?Findings: Perforated sigmoid diverticulum with generalized peritonitis and large volume intra-abdominal abscess ? ?Procedure in detail: Informed consent was obtained.  She received intravenous antibiotics.  She was brought the operating room and general endotracheal anesthesia was administered by the anesthesia staff.  Anesthesia also placed a central line and arterial line.  Foley catheter was placed by nursing.  Her abdomen was prepped and draped in a sterile fashion.  We began with a midline incision in the periumbilical region.  Subcutaneous tissues were dissected down revealing anterior fascia.  This was divided sharply along the midline and the peritoneal cavity was entered under direct vision.  This seems to enter the peritoneal cavity, large volume of pus returned.  This was sent for culture.  The fascia was opened to the length of the incision and we began exploring.  There was matted omentum over the bowel beneath the incision.  We gradually opened the length of the incision further to allow further exploration.  We freed up the omentum from the bowel.  We were able to free up all of the small bowel.  There was a questionable area on the sigmoid colon where it looks like there had been some hemorrhage into the appendices epiploic K.  There was not an obvious perforation so we began to look more at the  small bowel.  The small bowel was freed up and run from the ligament of Treitz to the terminal ileum and there was no small bowel perforation.  The right colon was intact transverse colon appeared intact and the left colon appeared intact.  There is matted omentum in the upper abdomen and we were able to see the stomach somewhat we did not see any abnormalities of there.  All of the fluid in the abdomen was tan serous in nature.  There was nothing green or bile-stained.  We reexamined this area on the sigmoid colon and did find indeed a small diverticular perforation there associated with this area of hemorrhage into the appendix epiploica.  Decision was made to resect the small section of sigmoid colon and bring out a colostomy.  It was divided distally to the disease process using a GIA 75 stapler.  We took down the mesentery with LigaSure getting good hemostasis and we divided it proximal to the pathology with a GIA 75 stapler.  We freed up some of the attachments to the peritoneum to mobilize the proximal colon.  We made a circular incision in the left lower quadrant.  Subcutaneous tissues were dissected down and we made a cruciate incision in the fascia.  This was opened into the peritoneal cavity and the opening was spread to fit 2 fingers at least.  We passed the colon out and freed up some of the fat along the edge of the colon so it could fully exit the abdomen as the colostomy.  The abdomen was copiously irrigated with multiple liters of saline.  We  ensured hemostasis.  We replaced the bowel in anatomic position.  Midline fascia was closed with running #1 looped PDS.  The skin was left open with a sterile wet-to-dry dressing.  The colostomy was matured with interrupted 3-0 Vicryl's.  The colostomy was viable.  Colostomy appliance and sterile dressings were applied.  All counts were correct.  She tolerated the procedure well.  There were no apparent complications and she will be taking to recovery room and  then the intensive care unit.  The critical care medicine service was consulted by Dr. Alwyn Pea.  We appreciate their help. ?PATIENT DISPOSITION:   ICU ?  ?Delay start of Pharmacological VTE agent (>24hrs) due to surgical blood loss or risk of bleeding:  no ? ?Georganna Skeans, MD, MPH, FACS ?Pager: [phone]  ?3/28/20236:27 PM ? ? ? ? ? ? ? ? ? ? ? ? ?  ?

## 2021-11-05 NOTE — Anesthesia Preprocedure Evaluation (Addendum)
Anesthesia Evaluation  ?Patient identified by MRN, date of birth, ID band ?Patient awake ? ? ? ?Reviewed: ?Allergy & Precautions, NPO status , Patient's Chart, lab work & pertinent test results ? ?Airway ?Mallampati: II ? ?TM Distance: >3 FB ?Neck ROM: Full ? ? ? Dental ?no notable dental hx. ? ?  ?Pulmonary ?neg pulmonary ROS, former smoker,  ?  ?Pulmonary exam normal ?breath sounds clear to auscultation ? ? ? ? ? ? Cardiovascular ?hypertension, negative cardio ROS ?Normal cardiovascular exam ?Rhythm:Regular Rate:Normal ? ? ?  ?Neuro/Psych ?PSYCHIATRIC DISORDERS Anxiety negative neurological ROS ?   ? GI/Hepatic ?negative GI ROS, Neg liver ROS,   ?Endo/Other  ?negative endocrine ROS ? Renal/GU ?negative Renal ROS  ?negative genitourinary ?  ?Musculoskeletal ?negative musculoskeletal ROS ?(+)  ? Abdominal ?  ?Peds ?negative pediatric ROS ?(+)  Hematology ? ?(+) Blood dyscrasia, anemia ,   ?Anesthesia Other Findings ? ? Reproductive/Obstetrics ?negative OB ROS ? ?  ? ? ? ? ? ? ? ? ? ? ? ? ? ?  ?  ? ? ? ? ? ? ? ? ?Anesthesia Physical ?Anesthesia Plan ? ?ASA: 3 and emergent ? ?Anesthesia Plan: General  ? ?Post-op Pain Management: Ofirmev IV (intra-op)*  ? ?Induction: Intravenous ? ?PONV Risk Score and Plan: 3 and Treatment may vary due to age or medical condition, Ondansetron and Dexamethasone ? ?Airway Management Planned: Oral ETT ? ?Additional Equipment: None ? ?Intra-op Plan:  ? ?Post-operative Plan: Extubation in OR ? ?Informed Consent: I have reviewed the patients History and Physical, chart, labs and discussed the procedure including the risks, benefits and alternatives for the proposed anesthesia with the patient or authorized representative who has indicated his/her understanding and acceptance.  ? ? ? ?Dental advisory given ? ?Plan Discussed with: CRNA, Anesthesiologist and Surgeon ? ?Anesthesia Plan Comments:   ? ? ? ? ? ? ?Anesthesia Quick Evaluation ? ?

## 2021-11-05 NOTE — Progress Notes (Signed)
Pharmacy Antibiotic Note ? ?Michele [name] is a 56 y.o. female admitted on 11/05/2021 with post-op bowel perf after hysterectomy 10/26/21. Pt s/p ex lap 11/05/21. Pharmacy has been consulted for Zosyn dosing. Cr up from baseline, CrCl ~30 ml/min. Zosyn dose given preop ~1500. ? ?Plan: ?Zosyn 3.375g IV q8h EI ?Follow Cr, cultures, LOT ? ?Height: '5\' 1"'$  (154.9 cm) ?Weight: 88.5 kg (195 lb 1.7 oz) ?IBW/kg (Calculated) : 47.8 ? ?Temp (24hrs), Avg:98.1 ?F (36.7 ?C), Min:97.6 ?F (36.4 ?C), Max:98.6 ?F (37 ?C) ? ?Recent Labs  ?Lab 11/05/21 ?1255  ?WBC 10.2  ?CREATININE 2.17*  ?  ?Estimated Creatinine Clearance: 30 mL/min (A) (by C-G formula based on SCr of 2.17 mg/dL (H)).   ? ?Allergies  ?Allergen Reactions  ? Lipitor [Atorvastatin]   ?  Muscle aches on Lipitor 20 mg and 40 mg   ? Sulfa Antibiotics Nausea And Vomiting  ? ? ?Antimicrobials this admission: ?Zosyn 3/28 >> ? ? ?Microbiology results: ?none ? ?Thank you for allowing pharmacy to be a part of this patient?s care. ? ? ?Arrie Senate, PharmD, BCPS, BCCP ?Clinical Pharmacist ?[phone] ?Please check AMION for all [facility] numbers ?11/05/2021 ? ? ?

## 2021-11-05 NOTE — Consult Note (Signed)
? ?NAME:  Michele [name], MRN:  [id number], DOB:  Nov 26, 1965, LOS: 0 ?ADMISSION DATE:  11/05/2021, CONSULTATION DATE:  11/05/21 ?REFERRING MD:  Alwyn Pea, CHIEF COMPLAINT:  Post op management  ? ?History of Present Illness:  ?89 yof who presented to GYN surg on POD 10 after Laparoscopic ->converted to open hysterectomy (10/26/21) w/ cc: abd wall pain/redness, dec appetite and SOB. Sent to ER ->CT imaging neg for PE but showed pneumoperitoneum w/ concern for bowel perforation. Was admitted to [facility], intubated for general anesthesia and now presents to to the ICU post op after exploratory laparotomy with sigmoid colectomy and hartmans procedure. ? ?PCCM asked to admit to ICU post operatively. ? ?Pertinent  Medical History  ?Anemia, anxiety, HTN w/pregnancy  ? ?Significant Hospital Events: ?Including procedures, antibiotic start and stop dates in addition to other pertinent events   ?3/28 admit. ? ?Interim History / Subjective:  ?Generalized abd pain, 4/10. ?Otherwise just feels a bit groggy after anesthesia. Vitals stable. ? ?Objective   ?Blood pressure (!) 141/78, pulse (!) 105, temperature 97.6 ?F (36.4 ?C), resp. rate 15, height '5\' 1"'$  (1.549 m), weight 88.5 kg, last menstrual period 10/07/2021, SpO2 94 %. ?   ?   ? ?Intake/Output Summary (Last 24 hours) at 11/05/2021 1959 ?Last data filed at 11/05/2021 1842 ?Gross per 24 hour  ?Intake 2000 ml  ?Output 110 ml  ?Net 1890 ml  ? ?Filed Weights  ? 11/05/21 1226 11/05/21 1545  ?Weight: 88.5 kg 88.5 kg  ? ? ?Examination: ?General: Adult female, resting in bed, in NAD. ?Neuro: Sleepy but easily arouses to voice, follows basic commands and answers questions appropriately. ?HEENT: [place]/AT. Sclerae anicteric. EOMI. ?Cardiovascular: RRR, no M/R/G.  ?Lungs: Respirations even and unlabored.  CTA bilaterally, No W/R/R. ?Abdomen: Midline dressings C/D/I with mild drainage.  Colostomy in place, stoma pink. ?Musculoskeletal: No gross deformities, no edema.  ?Skin: Intact, warm, no  rashes. ? ? ?Assessment & Plan:  ? ?Sepsis - 2/2 bowel perforation w/ abdominal peritonitis now s/p exploratory lap for perforated sigmoid colon w/ washout, colectomy and colostomy. ?- Admit to ICU post op. ?- Post op care per general surgery and GYN. ?- Cont IVFs. ?- Ck lactate. ?- Cont IV zosyn (day 1).  ?- F/u cultures. ?- Post-op CBC. ? ?AKI - presumed 2/2 sepsis, hypovolemia. ?Hypocalcemia. ?- Cont IVFs. ?- 1g Ca gluconate. ?- Follow BMP. ? ?Hyperglycemia. ?- SSI. ? ?Hx HTN, HLD. ?- Hold home Crestor. ? ?Hx anxiety. ?- Hold home Adderall, Citalopram. ? ?  ? ?Best Practice (right click and "Reselect all SmartList Selections" daily)  ? ?Diet/type: NPO ?DVT prophylaxis: other - hold Heparin SQ tonight, can start in AM ?GI prophylaxis: N/A ?Lines: Central line and Arterial Line ?Foley:  Yes, and it is still needed ?Code Status:  full code ?Last date of multidisciplinary goals of care discussion: None yet ? ?Labs   ?CBC: ?Recent Labs  ?Lab 11/05/21 ?1255 11/05/21 ?1655  ?WBC 10.2  --   ?NEUTROABS 7.3  --   ?HGB 11.1* 9.5*  ?HCT 33.2* 28.0*  ?MCV 79.2*  --   ?PLT 414*  --   ? ? ?Basic Metabolic Panel: ?Recent Labs  ?Lab 11/05/21 ?1255 11/05/21 ?1655  ?NA 133* 132*  ?K 4.8 4.1  ?CL 96*  --   ?CO2 21*  --   ?GLUCOSE 151*  --   ?BUN 46*  --   ?CREATININE 2.17*  --   ?CALCIUM 8.1*  --   ? ?GFR: ?Estimated Creatinine Clearance: 30 mL/min (  A) (by C-G formula based on SCr of 2.17 mg/dL (H)). ?Recent Labs  ?Lab 11/05/21 ?1255  ?WBC 10.2  ? ? ?Liver Function Tests: ?Recent Labs  ?Lab 11/05/21 ?1255  ?AST 32  ?ALT 22  ?ALKPHOS 138*  ?BILITOT 0.7  ?PROT 6.0*  ?ALBUMIN 1.9*  ? ?Recent Labs  ?Lab 11/05/21 ?1255  ?LIPASE 21  ? ?No results for input(s): AMMONIA in the last 168 hours. ? ?ABG ?   ?Component Value Date/Time  ? PHART 7.333 (L) 11/05/2021 1655  ? PCO2ART 43.0 11/05/2021 1655  ? PO2ART 100 11/05/2021 1655  ? HCO3 22.9 11/05/2021 1655  ? TCO2 24 11/05/2021 1655  ? ACIDBASEDEF 3.0 (H) 11/05/2021 1655  ? O2SAT 97 11/05/2021  1655  ?  ? ?Coagulation Profile: ?No results for input(s): INR, PROTIME in the last 168 hours. ? ?Cardiac Enzymes: ?No results for input(s): CKTOTAL, CKMB, CKMBINDEX, TROPONINI in the last 168 hours. ? ?HbA1C: ?No results found for: HGBA1C ? ?CBG: ?No results for input(s): GLUCAP in the last 168 hours. ? ?Review of Systems:   ?All negative; except for those that are bolded, which indicate positives. ? ?Constitutional: weight loss, weight gain, night sweats, fevers, chills, fatigue, weakness.  ?HEENT: headaches, sore throat, sneezing, nasal congestion, post nasal drip, difficulty swallowing, tooth/dental problems, visual complaints, visual changes, ear aches. ?Neuro: difficulty with speech, weakness, numbness, ataxia. ?CV:  chest pain, orthopnea, PND, swelling in lower extremities, dizziness, palpitations, syncope.  ?Resp: cough, hemoptysis, dyspnea, wheezing. ?GI: heartburn, indigestion, abdominal pain, nausea, vomiting, diarrhea, constipation, change in bowel habits, loss of appetite, hematemesis, melena, hematochezia.  ?GU: dysuria, change in color of urine, urgency or frequency, flank pain, hematuria. ?MSK: joint pain or swelling, decreased range of motion. ?Psych: change in mood or affect, depression, anxiety, suicidal ideations, homicidal ideations. ?Skin: rash, itching, bruising. ? ? ?Past Medical History:  ?She,  has a past medical history of Anemia, Anxiety, and Hypertension.  ? ?Surgical History:  ? ?Past Surgical History:  ?Procedure Laterality Date  ? CESAREAN SECTION    ? TWO of them  ? CYSTOSCOPY  10/25/2021  ? Procedure: CYSTOSCOPY;  Surgeon: Sanjuana Kava, MD;  Location: [facility] OR;  Service: Gynecology;;  ? dental implants N/A 2016  ? lower 2 teeth  ? HYSTERECTOMY ABDOMINAL WITH SALPINGECTOMY  10/25/2021  ? Procedure: HYSTERECTOMY ABDOMINAL WITH BILATERAL SALPINGECTOMY;  Surgeon: Sanjuana Kava, MD;  Location: [place];  Service: Gynecology;;  ? HYSTEROSCOPY WITH D & C  01/30/2017  ? Procedure: DILATATION AND  CURETTAGE /HYSTEROSCOPY;  Surgeon: Sanjuana Kava, MD;  Location: [facility];  Service: Gynecology;;  ? LAPAROSCOPY  10/25/2021  ? Procedure: LAPAROSCOPY DIAGNOSTIC;  Surgeon: Sanjuana Kava, MD;  Location: [place];  Service: Gynecology;;  ?  ? ?Social History:  ? reports that she quit smoking about 20 years ago. Her smoking use included cigarettes. She has a 7.50 pack-year smoking history. She has never used smokeless tobacco. She reports that she does not drink alcohol and does not use drugs.  ? ?Family History:  ?Her family history includes Heart attack in her father; Hypertension in her sister.  ? ?Allergies ?Allergies  ?Allergen Reactions  ? Lipitor [Atorvastatin]   ?  Muscle aches on Lipitor 20 mg and 40 mg   ? Sulfa Antibiotics Nausea And Vomiting  ?  ? ?Home Medications  ?Prior to Admission medications   ?Medication Sig Start Date End Date Taking? Authorizing Provider  ?ADDERALL XR 30 MG 24 hr capsule Take 30 mg by mouth daily. 09/23/21  Yes [provider]  ?citalopram (CELEXA) 20 MG tablet Take 20 mg by mouth daily.   Yes [provider]  ?CRESTOR 5 MG tablet TAKE 1 TABLET BY MOUTH EVERY DAY ?Patient taking differently: Take 5 mg by mouth daily. 04/17/15  Yes Dorothy Spark, MD  ?ibuprofen (ADVIL) 600 MG tablet Take 1 tablet (600 mg total) by mouth every 6 (six) hours as needed for cramping or moderate pain. 10/28/21  Yes Sanjuana Kava, MD  ?oxyCODONE-acetaminophen (PERCOCET/ROXICET) 5-325 MG tablet Take 1-2 tablets by mouth every 4 (four) hours as needed for severe pain or moderate pain. 10/28/21  Yes Sanjuana Kava, MD  ?simethicone (MYLICON) 935 MG chewable tablet Chew 125 mg by mouth every 6 (six) hours as needed for flatulence.   Yes [provider]  ?  ? ?Critical care time: N/A.  ? ?Montey Hora, PA - C ?[facility] ?For pager details, please see AMION or use Epic chat  ?After 1900, please call [facility] for cross coverage needs ?11/05/2021, 7:59 PM ? ? ? ? ?

## 2021-11-05 NOTE — ED Notes (Signed)
Fredia Sorrow, MD informed of pts belly button ?

## 2021-11-05 NOTE — Anesthesia Procedure Notes (Signed)
Central Venous Catheter Insertion ?Performed by: Effie Berkshire, MD, anesthesiologist ?Start/End3/28/2023 4:25 PM, 11/05/2021 4:35 PM ?Patient location: Pre-op. ?Preanesthetic checklist: patient identified, IV checked, site marked, risks and benefits discussed, surgical consent, monitors and equipment checked, pre-op evaluation, timeout performed and anesthesia consent ?Position: Trendelenburg ?Lidocaine 1% used for infiltration and patient sedated ?Hand hygiene performed , maximum sterile barriers used  and Seldinger technique used ?Catheter size: 8 Fr ?Total catheter length 16. ?Central line was placed.Double lumen ?Procedure performed using ultrasound guided technique. ?Ultrasound Notes:anatomy identified, needle tip was noted to be adjacent to the nerve/plexus identified, no ultrasound evidence of intravascular and/or intraneural injection and image(s) printed for medical record ?Attempts: 1 ?Following insertion, dressing applied, line sutured and Biopatch. ?Post procedure assessment: blood return through all ports ? ?Patient tolerated the procedure well with no immediate complications. ? ? ? ? ?

## 2021-11-05 NOTE — Consult Note (Signed)
? ? ? ? ?Consult/Admission Note ? ?Michele [name] ?02-03-1966  ?[id number].   ? ?Requesting MD: Dr. Alwyn Pea ?Chief Complaint/Reason for Consult: possible bowel perforation ? ?HPI:  ?56yo F 10D S/P laparoscopic converted to open hysterectomy by Dr. Alwyn Pea.  She was seen in the GYN office today and sent to the ED for further evaluation.  She is having decreased appetite, shortness of breath, and some redness of her abdominal wall.  She underwent evaluation in the emergency department.  This included a CT scan which ruled out pulmonary embolus.  It did, however, show significant pneumoperitoneum with possible bowel perforation.  The patient is in preop holding and is getting ready to undergo exploration by Dr. Ileene Rubens.  We are consulted to assist and manage any bowel injuries.  The patient notes that during her CT scan she drained some yellow material out of her umbilicus. ? ?ROS: ?Review of Systems  ?Constitutional:  Negative for fever.  ?HENT: Negative.    ?Eyes: Negative.   ?Respiratory: Negative.    ?Cardiovascular: Negative.   ?Gastrointestinal:  Positive for constipation and nausea. Negative for vomiting.  ?     See HPI  ?Genitourinary: Negative.   ?Musculoskeletal: Negative.   ?Skin:   ?     Redness lower abdominal wall  ?Neurological: Negative.   ?Endo/Heme/Allergies: Negative.   ?Psychiatric/Behavioral: Negative.    ? ?Family History  ?Problem Relation Age of Onset  ? Heart attack Father   ? Hypertension Sister   ? ? ?Past Medical History:  ?Diagnosis Date  ? Anemia   ? Anxiety   ? Hypertension   ? with pregnancies  ? ? ?Past Surgical History:  ?Procedure Laterality Date  ? CESAREAN SECTION    ? TWO of them  ? CYSTOSCOPY  10/25/2021  ? Procedure: CYSTOSCOPY;  Surgeon: Michele Kava, MD;  Location: [facility];  Service: Gynecology;;  ? dental implants N/A 2016  ? lower 2 teeth  ? HYSTERECTOMY ABDOMINAL WITH SALPINGECTOMY  10/25/2021  ? Procedure: HYSTERECTOMY ABDOMINAL WITH BILATERAL SALPINGECTOMY;  Surgeon: Michele Kava,  MD;  Location: [place];  Service: Gynecology;;  ? HYSTEROSCOPY WITH D & C  01/30/2017  ? Procedure: DILATATION AND CURETTAGE /HYSTEROSCOPY;  Surgeon: Michele Kava, MD;  Location: [facility];  Service: Gynecology;;  ? LAPAROSCOPY  10/25/2021  ? Procedure: LAPAROSCOPY DIAGNOSTIC;  Surgeon: Michele Kava, MD;  Location: [place];  Service: Gynecology;;  ? ? ?Social History:  reports that she quit smoking about 20 years ago. Her smoking use included cigarettes. She has a 7.50 pack-year smoking history. She has never used smokeless tobacco. She reports that she does not drink alcohol and does not use drugs. ? ?Allergies:  ?Allergies  ?Allergen Reactions  ? Lipitor [Atorvastatin]   ?  Muscle aches on Lipitor 20 mg and 40 mg   ? Sulfa Antibiotics Nausea And Vomiting  ? ? ?Medications Prior to Admission  ?Medication Sig Dispense Refill  ? ADDERALL XR 30 MG 24 hr capsule Take 30 mg by mouth daily.    ? citalopram (CELEXA) 20 MG tablet Take 20 mg by mouth daily.    ? CRESTOR 5 MG tablet TAKE 1 TABLET BY MOUTH EVERY DAY (Patient taking differently: Take 5 mg by mouth daily.) 90 tablet 0  ? ibuprofen (ADVIL) 600 MG tablet Take 1 tablet (600 mg total) by mouth every 6 (six) hours as needed for cramping or moderate pain. 60 tablet 3  ? oxyCODONE-acetaminophen (PERCOCET/ROXICET) 5-325 MG tablet Take 1-2 tablets by mouth every  4 (four) hours as needed for severe pain or moderate pain. 45 tablet 0  ? simethicone (MYLICON) 222 MG chewable tablet Chew 125 mg by mouth every 6 (six) hours as needed for flatulence.    ? ? ?Blood pressure 125/79, pulse (!) 106, temperature 98.6 ?F (37 ?C), temperature source Oral, resp. rate (!) 26, height '5\' 1"'$  (1.549 m), weight 88.5 kg, last menstrual period 10/07/2021, SpO2 91 %. ?Physical Exam:  ?General: pleasant, WD, 56yo female who is in pre-op holding ?HEENT: head is normocephalic, atraumatic.  Sclera are noninjected.  PERRL.  Ears and nose without any masses or lesions.  Mouth is pink and moist ?Heart:  regular, rate, and rhythm.  Normal s1,s2. No obvious murmurs, gallops, or rubs noted.  Palpable radial and pedal pulses bilaterally ?Lungs: CTAB, no wheezes, rhonchi, or rales noted.  Respiratory effort nonlabored ?Abd: distended, some tenderness, mild drainage from umbilicus, erythema inferior midline ?MS: all 4 extremities are symmetrical with no cyanosis, clubbing, or edema. ?Skin: warm and dry with no masses, lesions, or rashes ?Neuro: Cranial nerves 2-12 grossly intact, sensation is normal throughout ?Psych: A&Ox3 with an appropriate affect. ? ? ?Results for orders placed or performed during the hospital encounter of 11/05/21 (from the past 48 hour(s))  ?CBC with Differential/Platelet     Status: Abnormal  ? Collection Time: 11/05/21 12:55 PM  ?Result Value Ref Range  ? WBC 10.2 4.0 - 10.5 K/uL  ? RBC 4.19 3.87 - 5.11 MIL/uL  ? Hemoglobin 11.1 (L) 12.0 - 15.0 g/dL  ? HCT 33.2 (L) 36.0 - 46.0 %  ? MCV 79.2 (L) 80.0 - 100.0 fL  ? MCH 26.5 26.0 - 34.0 pg  ? MCHC 33.4 30.0 - 36.0 g/dL  ? RDW 15.0 11.5 - 15.5 %  ? Platelets 414 (H) 150 - 400 K/uL  ? nRBC 0.2 0.0 - 0.2 %  ? Neutrophils Relative % 72 %  ? Neutro Abs 7.3 1.7 - 7.7 K/uL  ? Lymphocytes Relative 16 %  ? Lymphs Abs 1.6 0.7 - 4.0 K/uL  ? Monocytes Relative 6 %  ? Monocytes Absolute 0.6 0.1 - 1.0 K/uL  ? Eosinophils Relative 0 %  ? Eosinophils Absolute 0.0 0.0 - 0.5 K/uL  ? Basophils Relative 0 %  ? Basophils Absolute 0.0 0.0 - 0.1 K/uL  ? Immature Granulocytes 6 %  ? Abs Immature Granulocytes 0.59 (H) 0.00 - 0.07 K/uL  ?  Comment: Performed at [facility], [place] [address] [place],  [address]  ?Comprehensive metabolic panel     Status: Abnormal  ? Collection Time: 11/05/21 12:55 PM  ?Result Value Ref Range  ? Sodium 133 (L) 135 - 145 mmol/L  ? Potassium 4.8 3.5 - 5.1 mmol/L  ? Chloride 96 (L) 98 - 111 mmol/L  ? CO2 21 (L) 22 - 32 mmol/L  ? Glucose, Bld 151 (H) 70 - 99 mg/dL  ?  Comment: Glucose reference range applies only to samples taken  after fasting for at least 8 hours.  ? BUN 46 (H) 6 - 20 mg/dL  ? Creatinine, Ser 2.17 (H) 0.44 - 1.00 mg/dL  ? Calcium 8.1 (L) 8.9 - 10.3 mg/dL  ? Total Protein 6.0 (L) 6.5 - 8.1 g/dL  ? Albumin 1.9 (L) 3.5 - 5.0 g/dL  ? AST 32 15 - 41 U/L  ? ALT 22 0 - 44 U/L  ? Alkaline Phosphatase 138 (H) 38 - 126 U/L  ? Total Bilirubin 0.7 0.3 - 1.2 mg/dL  ?  GFR, Estimated 26 (L) >60 mL/min  ?  Comment: (NOTE) ?Calculated using the CKD-EPI Creatinine Equation (2021) ?  ? Anion gap 16 (H) 5 - 15  ?  Comment: Performed at [facility], [place] [address] [place], [place] [address]  ?Lipase, blood     Status: None  ? Collection Time: 11/05/21 12:55 PM  ?Result Value Ref Range  ? Lipase 21 11 - 51 U/L  ?  Comment: Performed at [facility], [place] [address], [place], [place] [address]  ?Troponin I (High Sensitivity)     Status: Abnormal  ? Collection Time: 11/05/21 12:55 PM  ?Result Value Ref Range  ? Troponin I (High Sensitivity) 198 (HH) <18 ng/L  ?  Comment: CRITICAL RESULT CALLED TO, READ BACK BY AND VERIFIED WITH: ?JAY,D RN @ 1400 11/05/21 LEONARD,A ?(NOTE) ?Elevated high sensitivity troponin I (hsTnI) values and significant  ?changes across serial measurements may suggest ACS but many other  ?chronic and acute conditions are known to elevate hsTnI results.  ?Refer to the Links section for chest pain algorithms and additional  ?guidance. ?Performed at [facility], [place] [address], [place], [place] ?[address] ?  ?Type and screen [place]     Status: None (Preliminary result)  ? Collection Time: 11/05/21  3:00 PM  ?Result Value Ref Range  ? ABO/RH(D) PENDING   ? Antibody Screen PENDING   ? Sample Expiration    ?  11/08/2021,2359 ?Performed at [facility], [place] [address] [place], [place] [address] ?  ? ?CT Angio Chest PE W/Cm &/Or Wo Cm ? ?Result Date: 11/05/2021 ?CLINICAL DATA:  Abdominal pain. Possible pulmonary embolus. Status post hysterectomy 10 days ago. EXAM: CT ANGIOGRAPHY CHEST CT  ABDOMEN AND PELVIS WITH CONTRAST TECHNIQUE: Multidetector CT imaging of the chest was performed using the standard protocol during bolus administration of intravenous contrast. Multiplanar CT image reconstruction

## 2021-11-05 NOTE — H&P (Signed)
MD GYN HISTORY AND PHYSICAL ? ?Admission Date: 11/05/2021 12:18 PM  ?Admit Diagnosis: Post operative bowel perforation / injury ? ?Patient Name: Michele [name]        ?MRN#: [id number] ? ?Subjective:  ? ? Patient is a 56 y.o. female seen in the GYN office today acutely for post operative pain that was refractory to pain medication. Patient is s/p diagnostic laparoscopy / total abdominal hysterectomy on October 26, 2021. Patient was seen and examined in office. She complained of chest pain and shortness of breath and diffuse abdominal pain worse with inspiration.  Patient denies nausea or vomiting, notes feeling "hot", shivering with chills, she reports bowel movements and normal voiding.  ?Patient was sent emergently from office via ambulance to [facility] ER.   ? ? ?Medical / Surgical History: ?Past medical history:  ?Past Medical History:  ?Diagnosis Date  ? Anemia   ? Anxiety   ? Hypertension   ? with pregnancies  ?  ?Past surgical history:  ?Past Surgical History:  ?Procedure Laterality Date  ? CESAREAN SECTION    ? TWO of them  ? CYSTOSCOPY  10/25/2021  ? Procedure: CYSTOSCOPY;  Surgeon: Sanjuana Kava, MD;  Location: [facility] OR;  Service: Gynecology;;  ? dental implants N/A 2016  ? lower 2 teeth  ? HYSTERECTOMY ABDOMINAL WITH SALPINGECTOMY  10/25/2021  ? Procedure: HYSTERECTOMY ABDOMINAL WITH BILATERAL SALPINGECTOMY;  Surgeon: Sanjuana Kava, MD;  Location: [place];  Service: Gynecology;;  ? HYSTEROSCOPY WITH D & C  01/30/2017  ? Procedure: DILATATION AND CURETTAGE /HYSTEROSCOPY;  Surgeon: Sanjuana Kava, MD;  Location: [facility];  Service: Gynecology;;  ? LAPAROSCOPY  10/25/2021  ? Procedure: LAPAROSCOPY DIAGNOSTIC;  Surgeon: Sanjuana Kava, MD;  Location: [place];  Service: Gynecology;;  ? ?Family History:  ?Family History  ?Problem Relation Age of Onset  ? Heart attack Father   ? Hypertension Sister   ?  ?Social History:  reports that she quit smoking about 20 years ago. Her smoking use included cigarettes. She has a 7.50 pack-year  smoking history. She has never used smokeless tobacco. She reports that she does not drink alcohol and does not use drugs. ? ?Allergies: ?Allergies  ?Allergen Reactions  ? Lipitor [Atorvastatin]   ?  Muscle aches on Lipitor 20 mg and 40 mg   ? Sulfa Antibiotics Nausea And Vomiting  ? ?  ?Current Medications at time of admission:  ?Prior to Admission medications   ?Medication Sig Start Date End Date Taking? Authorizing Provider  ?ADDERALL XR 30 MG 24 hr capsule Take 30 mg by mouth daily. 09/23/21  Yes [provider]  ?citalopram (CELEXA) 20 MG tablet Take 20 mg by mouth daily.   Yes [provider]  ?CRESTOR 5 MG tablet TAKE 1 TABLET BY MOUTH EVERY DAY ?Patient taking differently: Take 5 mg by mouth daily. 04/17/15  Yes Dorothy Spark, MD  ?ibuprofen (ADVIL) 600 MG tablet Take 1 tablet (600 mg total) by mouth every 6 (six) hours as needed for cramping or moderate pain. 10/28/21  Yes Sanjuana Kava, MD  ?oxyCODONE-acetaminophen (PERCOCET/ROXICET) 5-325 MG tablet Take 1-2 tablets by mouth every 4 (four) hours as needed for severe pain or moderate pain. 10/28/21  Yes Sanjuana Kava, MD  ?simethicone (MYLICON) 294 MG chewable tablet Chew 125 mg by mouth every 6 (six) hours as needed for flatulence.   Yes [provider]  ? ? ?Review of Systems: ?Constitutional: Negative   ?HENT: Negative   ?Eyes: Negative   ?Respiratory: Negative   ?  Cardiovascular: Negative   ?Gastrointestinal: Negative  ?Genitourinary: neg for VB ?Musculoskeletal: Negative   ?Skin: Negative   ?Neurological: Negative   ?Endo/Heme/Allergies: Negative   ?Psychiatric/Behavioral: Negative  ?  ?  ?Objective:  ? ?  ?Physical Exam: ?VS: Blood pressure 125/79, pulse (!) 106, temperature 98.6 ?F (37 ?C), temperature source Oral, resp. rate (!) 26, height '5\' 1"'$  (1.549 m), weight 88.5 kg, last menstrual period 10/07/2021, SpO2 91 %. ?Physical Exam ?General:   alert, cooperative, flushed, moderate distress, and pale  ?Lungs:   clear to  auscultation bilaterally  ?Heart:    tachycardic  ?Abdomen:  +distended, +rebound and guarding; discharge from umbilicus  ?Pelvis:  Not done  ? ?Labs / Imaging: ?Results for orders placed or performed during the hospital encounter of 11/05/21 (from the past 24 hour(s))  ?CBC with Differential/Platelet     Status: Abnormal  ? Collection Time: 11/05/21 12:55 PM  ?Result Value Ref Range  ? WBC 10.2 4.0 - 10.5 K/uL  ? RBC 4.19 3.87 - 5.11 MIL/uL  ? Hemoglobin 11.1 (L) 12.0 - 15.0 g/dL  ? HCT 33.2 (L) 36.0 - 46.0 %  ? MCV 79.2 (L) 80.0 - 100.0 fL  ? MCH 26.5 26.0 - 34.0 pg  ? MCHC 33.4 30.0 - 36.0 g/dL  ? RDW 15.0 11.5 - 15.5 %  ? Platelets 414 (H) 150 - 400 K/uL  ? nRBC 0.2 0.0 - 0.2 %  ? Neutrophils Relative % 72 %  ? Neutro Abs 7.3 1.7 - 7.7 K/uL  ? Lymphocytes Relative 16 %  ? Lymphs Abs 1.6 0.7 - 4.0 K/uL  ? Monocytes Relative 6 %  ? Monocytes Absolute 0.6 0.1 - 1.0 K/uL  ? Eosinophils Relative 0 %  ? Eosinophils Absolute 0.0 0.0 - 0.5 K/uL  ? Basophils Relative 0 %  ? Basophils Absolute 0.0 0.0 - 0.1 K/uL  ? Immature Granulocytes 6 %  ? Abs Immature Granulocytes 0.59 (H) 0.00 - 0.07 K/uL  ?Comprehensive metabolic panel     Status: Abnormal  ? Collection Time: 11/05/21 12:55 PM  ?Result Value Ref Range  ? Sodium 133 (L) 135 - 145 mmol/L  ? Potassium 4.8 3.5 - 5.1 mmol/L  ? Chloride 96 (L) 98 - 111 mmol/L  ? CO2 21 (L) 22 - 32 mmol/L  ? Glucose, Bld 151 (H) 70 - 99 mg/dL  ? BUN 46 (H) 6 - 20 mg/dL  ? Creatinine, Ser 2.17 (H) 0.44 - 1.00 mg/dL  ? Calcium 8.1 (L) 8.9 - 10.3 mg/dL  ? Total Protein 6.0 (L) 6.5 - 8.1 g/dL  ? Albumin 1.9 (L) 3.5 - 5.0 g/dL  ? AST 32 15 - 41 U/L  ? ALT 22 0 - 44 U/L  ? Alkaline Phosphatase 138 (H) 38 - 126 U/L  ? Total Bilirubin 0.7 0.3 - 1.2 mg/dL  ? GFR, Estimated 26 (L) >60 mL/min  ? Anion gap 16 (H) 5 - 15  ?Lipase, blood     Status: None  ? Collection Time: 11/05/21 12:55 PM  ?Result Value Ref Range  ? Lipase 21 11 - 51 U/L  ?Troponin I (High Sensitivity)     Status: Abnormal  ?  Collection Time: 11/05/21 12:55 PM  ?Result Value Ref Range  ? Troponin I (High Sensitivity) 198 (HH) <18 ng/L  ?Type and screen [place]     Status: None (Preliminary result)  ? Collection Time: 11/05/21  3:00 PM  ?Result Value Ref Range  ? ABO/RH(D) PENDING   ?  Antibody Screen PENDING   ? Sample Expiration    ?  11/08/2021,2359 ?Performed at [facility], [place] [address] [place], [place] [address] ?  ? ?CT Angio Chest PE W/Cm &/Or Wo Cm ? ?Result Date: 11/05/2021 ?CLINICAL DATA:  Abdominal pain. Possible pulmonary embolus. Status post hysterectomy 10 days ago. EXAM: CT ANGIOGRAPHY CHEST CT ABDOMEN AND PELVIS WITH CONTRAST TECHNIQUE: Multidetector CT imaging of the chest was performed using the standard protocol during bolus administration of intravenous contrast. Multiplanar CT image reconstructions and MIPs were obtained to evaluate the vascular anatomy. Multidetector CT imaging of the abdomen and pelvis was performed using the standard protocol during bolus administration of intravenous contrast. RADIATION DOSE REDUCTION: This exam was performed according to the departmental dose-optimization program which includes automated exposure control, adjustment of the mA and/or kV according to patient size and/or use of iterative reconstruction technique. CONTRAST:  193m OMNIPAQUE IOHEXOL 350 MG/ML SOLN COMPARISON:  None. FINDINGS: CTA CHEST FINDINGS Cardiovascular: Satisfactory opacification of the pulmonary arteries to the segmental level. No evidence of pulmonary embolism. Normal heart size. No pericardial effusion. Mediastinum/Nodes: No enlarged mediastinal, hilar, or axillary lymph nodes. Thyroid gland, trachea, and esophagus demonstrate no significant findings. Lungs/Pleura: No pneumothorax or pleural effusion is noted. Mild bibasilar subsegmental atelectasis is noted. Musculoskeletal: No chest wall abnormality. No acute or significant osseous findings. Review of the MIP images confirms  the above findings. CT ABDOMEN and PELVIS FINDINGS Hepatobiliary: No focal liver abnormality is seen. No gallstones, gallbladder wall thickening, or biliary dilatation. Pancreas: Unremarkable. No pancreatic ductal di

## 2021-11-05 NOTE — Anesthesia Procedure Notes (Signed)
Arterial Line Insertion ?Start/End3/28/2023 4:25 PM, 11/05/2021 4:32 PM ?Performed by: Lance Coon, CRNA, CRNA ? Preanesthetic checklist: patient identified, IV checked, site marked, risks and benefits discussed, surgical consent, monitors and equipment checked, pre-op evaluation, timeout performed and anesthesia consent ?Lidocaine 1% used for infiltration ?Left, radial was placed ?Catheter size: 20 G ?Hand hygiene performed , maximum sterile barriers used  and Seldinger technique used ? ?Attempts: 3 ?Procedure performed without using ultrasound guided technique. ?Following insertion, dressing applied and Biopatch. ?Post procedure assessment: normal and unchanged ? ?Post procedure complications: second provider assisted. ? ? ?

## 2021-11-05 NOTE — ED Provider Notes (Addendum)
?[place] ?Provider Note ? ? ?CSN: [id number] ?Arrival date & time: 11/05/21  1218 ? ?  ? ?History ? ?Chief Complaint  ?Patient presents with  ? Chest Pain  ? Shortness of Breath  ? Abdominal Pain  ? ? ?Michele [name] is a 56 y.o. female. ? ?Patient sent in from GYN office.  Concern was for possible PE.  Patient status post total abdominal hysterectomy on March 17.  They attempted to do it laparoscopically but then had a do it open.  Patient states that on Wednesday of last week started to have increased abdominal pain.  And increased abdominal distention.  Patient has not had any nausea or vomiting.  But when she lays down she has a lot of trouble breathing and also when she takes a deep breath as her diaphragm moves down she suddenly gets a lot of pain in the abdomen.  Patient was noted to be tachycardic at the doctor's office heart rate here is 114.  Oxygen saturation on room air is 95% it was apparently 97% at the doctor's office.  Blood pressure is 141/79.  Patient also does have swelling to both legs.  Patient states she does not really have any incisional pain.  GYN surgeon was Dr. Sanjuana Kava. ? ?Past medical history sniffing for hypertension and anxiety. ? ? ?  ? ?Home Medications ?Prior to Admission medications   ?Medication Sig Start Date End Date Taking? Authorizing Provider  ?ADDERALL XR 30 MG 24 hr capsule Take 30 mg by mouth daily. 09/23/21   [provider]  ?citalopram (CELEXA) 20 MG tablet Take 20 mg by mouth daily.    [provider]  ?CRESTOR 5 MG tablet TAKE 1 TABLET BY MOUTH EVERY DAY 04/17/15   Dorothy Spark, MD  ?fluticasone (FLONASE) 50 MCG/ACT nasal spray Place 1 spray into both nostrils daily as needed for allergies or rhinitis. ?Patient not taking: Reported on 10/17/2021    [provider]  ?ibuprofen (ADVIL) 600 MG tablet Take 1 tablet (600 mg total) by mouth every 6 (six) hours as needed for cramping or moderate  pain. 10/28/21   Sanjuana Kava, MD  ?mometasone (NASONEX) 50 MCG/ACT nasal spray Place 2 sprays into the nose daily as needed (allergies).    [provider]  ?oxyCODONE-acetaminophen (PERCOCET/ROXICET) 5-325 MG tablet Take 1-2 tablets by mouth every 4 (four) hours as needed for severe pain or moderate pain. 10/28/21   Sanjuana Kava, MD  ?   ? ?Allergies    ?Lipitor [atorvastatin] and Sulfa antibiotics   ? ?Review of Systems   ?Review of Systems  ?Constitutional:  Negative for chills and fever.  ?HENT:  Negative for ear pain and sore throat.   ?Eyes:  Negative for pain and visual disturbance.  ?Respiratory:  Positive for shortness of breath. Negative for cough.   ?Cardiovascular:  Positive for chest pain and leg swelling. Negative for palpitations.  ?Gastrointestinal:  Positive for abdominal distention and abdominal pain. Negative for nausea and vomiting.  ?Genitourinary:  Negative for dysuria and hematuria.  ?Musculoskeletal:  Negative for arthralgias and back pain.  ?Skin:  Negative for color change and rash.  ?Neurological:  Negative for seizures and syncope.  ?All other systems reviewed and are negative. ? ?Physical Exam ?Updated Vital Signs ?BP (!) 110/99   Pulse (!) 111   Temp 98.6 ?F (37 ?C) (Oral)   Resp 18   Ht 1.549 m ('5\' 1"'$ )   Wt 88.5 kg  LMP 10/07/2021 (Approximate)   SpO2 96%   BMI 36.84 kg/m?  ?Physical Exam ?Vitals and nursing note reviewed.  ?Constitutional:   ?   General: She is not in acute distress. ?   Appearance: She is well-developed. She is not ill-appearing, toxic-appearing or diaphoretic.  ?HENT:  ?   Head: Normocephalic and atraumatic.  ?Eyes:  ?   Extraocular Movements: Extraocular movements intact.  ?   Conjunctiva/sclera: Conjunctivae normal.  ?   Pupils: Pupils are equal, round, and reactive to light.  ?Cardiovascular:  ?   Rate and Rhythm: Regular rhythm. Tachycardia present.  ?   Heart sounds: No murmur heard. ?Pulmonary:  ?   Effort: Pulmonary effort is normal. No  respiratory distress.  ?   Breath sounds: Normal breath sounds. No stridor. No wheezing, rhonchi or rales.  ?Chest:  ?   Chest wall: No tenderness.  ?Abdominal:  ?   General: There is distension.  ?   Palpations: Abdomen is soft.  ?   Tenderness: There is abdominal tenderness. There is no guarding.  ?   Comments: Says it is all healing well.  No evidence of any infection.  Abdomen markedly distended.  With diffuse tenderness.  But no guarding.  ?Musculoskeletal:     ?   General: No swelling.  ?   Cervical back: Neck supple.  ?   Right lower leg: Edema present.  ?   Left lower leg: Edema present.  ?Skin: ?   General: Skin is warm and dry.  ?   Capillary Refill: Capillary refill takes less than 2 seconds.  ?Neurological:  ?   General: No focal deficit present.  ?   Mental Status: She is alert and oriented to person, place, and time.  ?Psychiatric:     ?   Mood and Affect: Mood normal.  ? ? ?ED Results / Procedures / Treatments   ?Labs ?(all labs ordered are listed, but only abnormal results are displayed) ?Labs Reviewed  ?CBC WITH DIFFERENTIAL/PLATELET  ?COMPREHENSIVE METABOLIC PANEL  ?LIPASE, BLOOD  ?TROPONIN I (HIGH SENSITIVITY)  ? ? ?EKG ?EKG Interpretation ? ?Date/Time:  Tuesday November 05 2021 12:29:46 EDT ?Ventricular Rate:  113 ?PR Interval:  125 ?QRS Duration: 82 ?QT Interval:  321 ?QTC Calculation: 441 ?R Axis:   47 ?Text Interpretation: Sinus tachycardia Biatrial enlargement Inferior infarct, age indeterminate Confirmed by Fredia Sorrow [phone]) on 11/05/2021 12:38:45 PM ? ?Radiology ?No results found. ? ?Procedures ?Procedures  ? ? ?Medications Ordered in ED ?Medications  ?0.9 %  sodium chloride infusion (has no administration in time range)  ?ondansetron (ZOFRAN) injection 4 mg (has no administration in time range)  ?HYDROmorphone (DILAUDID) injection 0.5 mg (has no administration in time range)  ? ? ?ED Course/ Medical Decision Making/ A&P ?  ?                        ?Medical Decision Making ?Amount and/or  Complexity of Data Reviewed ?Labs: ordered. ?Radiology: ordered. ? ?Risk ?Prescription drug management. ?Decision regarding hospitalization. ? ? ?Patient sent in for concerns for possible pulmonary embolism.  With patient has significant abdominal distention and diffuse tenderness.  Concerned about fluid inside the abdomen.  That would go along also with her inability to lay down and also when she takes a deep breath his diaphragm moves down she gets acute pain in the upper part of the abdomen.  Also does have leg swelling but it is equal bilaterally. ? ?We will get  basic labs CBC complete metabolic panel lipase.  Patient's EKG had some questionable inferior changes so we will get a troponin as well.  We will get portable chest x-ray to review first.  Will most likely go on and do CT of the abdomen with contrast and a CT angio of the chest to rule out PE. ? ?2 significant findings.  1 CT angio does not show pulmonary embolus.  Troponin was significantly elevated at 198.  But patient's abdomen shows marked free air way above will be expected post surgical.  Highly suspicious for perforated bowel large intestines on the right side.  I think there is may be some stool outside of the bowel lumen. ? ?Patient will have a delta troponin at 3 PM. ? ?Discussed with Dr. Alwyn Pea patient's OB/GYN surgeon.  She is going to take the patient to the OR.  Asked me to put in telemetry admit orders for her that is been done.  Patient will be n.p.o. and I did a type and screen and patient will be given Zosyn as well. ? ? ?Final Clinical Impression(s) / ED Diagnoses ?Final diagnoses:  ?Generalized abdominal pain  ?SOB (shortness of breath)  ? ? ?Rx / DC Orders ?ED Discharge Orders   ? ? None  ? ?  ? ? ?  ?Fredia Sorrow, MD ?11/05/21 1314 ? ?  ?Fredia Sorrow, MD ?11/05/21 1456 ? ?

## 2021-11-05 NOTE — Transfer of Care (Signed)
Immediate Anesthesia Transfer of Care Note ? ?Patient: Michele [name] ? ?Procedure(s) Performed: EXPLORATORY LAPAROTOMY AND COLECTOMY  (Abdomen) ?COLOSTOMY (Left: Abdomen) ? ?Patient Location: PACU ? ?Anesthesia Type:General ? ?Level of Consciousness: awake, alert  and oriented ? ?Airway & Oxygen Therapy: Patient Spontanous Breathing and Patient connected to nasal cannula oxygen ? ?Post-op Assessment: Report given to RN and Post -op Vital signs reviewed and stable ? ?Post vital signs: Reviewed and stable ? ?Last Vitals:  ?Vitals Value Taken Time  ?BP 151/130 11/05/21 1840  ?Temp    ?Pulse 114 11/05/21 1843  ?Resp 22 11/05/21 1843  ?SpO2 92 % 11/05/21 1843  ?Vitals shown include unvalidated device data. ? ?Last Pain:  ?Vitals:  ? 11/05/21 1556  ?TempSrc:   ?PainSc: 3   ?   ? ?Patients Stated Pain Goal: 0 (11/05/21 1556) ? ?Complications: No notable events documented. ?

## 2021-11-05 NOTE — ED Triage Notes (Signed)
Pt arrived via [facility] from PCP with c/c of possible PE. Per doctors officer pt had a recent hysterectomy 3/17 and started 6 days ago shob, abd pain, shob with exertion, lowering to lower extremities & chest discomfort. ? ?118HR, 136/84, 97% RA ?

## 2021-11-05 NOTE — Progress Notes (Signed)
eLink Physician-Brief Progress Note ?Patient Name: Michele [name] ?DOB: 08/02/1966 ?MRN: [id number] ? ? ?Date of Service ? 11/05/2021  ?HPI/Events of Note ? Patient transferred to the unit mechanically ventilated s/p  exploratory laparotomy with sigmoid colectomy  / Hartman's procedure for sigmoid perforation.  ?eICU Interventions ? New Patient Evaluation.  ? ? ? ?  ? ?Kerry Kass Dwight Burdo ?11/05/2021, 9:25 PM ?

## 2021-11-05 NOTE — ED Notes (Signed)
Report given to Minna Merritts , RN of Short Stay bay 36 ?

## 2021-11-05 NOTE — Anesthesia Procedure Notes (Signed)
Procedure Name: Intubation ?Date/Time: 11/05/2021 4:23 PM ?Performed by: Lance Coon, CRNA ?Pre-anesthesia Checklist: Patient identified, Emergency Drugs available, Suction available, Patient being monitored and Timeout performed ?Patient Re-evaluated:Patient Re-evaluated prior to induction ?Oxygen Delivery Method: Circle system utilized ?Preoxygenation: Pre-oxygenation with 100% oxygen ?Induction Type: IV induction, Rapid sequence and Cricoid Pressure applied ?Laryngoscope Size: Sabra Heck and 3 ?Grade View: Grade I ?Tube type: Oral ?Tube size: 7.0 mm ?Number of attempts: 1 ?Airway Equipment and Method: Stylet ?Placement Confirmation: ETT inserted through vocal cords under direct vision, positive ETCO2 and breath sounds checked- equal and bilateral ?Secured at: 21 cm ?Tube secured with: Tape ?Dental Injury: Teeth and Oropharynx as per pre-operative assessment  ? ? ? ? ?

## 2021-11-06 ENCOUNTER — Encounter (HOSPITAL_COMMUNITY): Payer: Self-pay | Admitting: Obstetrics & Gynecology

## 2021-11-06 DIAGNOSIS — K631 Perforation of intestine (nontraumatic): Secondary | ICD-10-CM | POA: Diagnosis not present

## 2021-11-06 LAB — CBC
HCT: 27.1 % — ABNORMAL LOW (ref 36.0–46.0)
HCT: 26.4 % — ABNORMAL LOW (ref 36.0–46.0)
Hemoglobin: 9.2 g/dL — ABNORMAL LOW (ref 12.0–15.0)
Hemoglobin: 8.7 g/dL — ABNORMAL LOW (ref 12.0–15.0)
MCH: 26.4 pg (ref 26.0–34.0)
MCH: 25.5 pg — ABNORMAL LOW (ref 26.0–34.0)
MCHC: 33.9 g/dL (ref 30.0–36.0)
MCHC: 33 g/dL (ref 30.0–36.0)
MCV: 77.7 fL — ABNORMAL LOW (ref 80.0–100.0)
MCV: 77.4 fL — ABNORMAL LOW (ref 80.0–100.0)
Platelets: 364 10*3/uL (ref 150–400)
Platelets: 354 10*3/uL (ref 150–400)
RBC: 3.49 MIL/uL — ABNORMAL LOW (ref 3.87–5.11)
RBC: 3.41 MIL/uL — ABNORMAL LOW (ref 3.87–5.11)
RDW: 14.9 % (ref 11.5–15.5)
RDW: 14.7 % (ref 11.5–15.5)
WBC: 14.1 10*3/uL — ABNORMAL HIGH (ref 4.0–10.5)
WBC: 14.6 10*3/uL — ABNORMAL HIGH (ref 4.0–10.5)
nRBC: 0 % (ref 0.0–0.2)
nRBC: 0 % (ref 0.0–0.2)

## 2021-11-06 LAB — HEMOGLOBIN A1C
Hgb A1c MFr Bld: 6.6 % — ABNORMAL HIGH (ref 4.8–5.6)
Mean Plasma Glucose: 142.72 mg/dL

## 2021-11-06 LAB — MAGNESIUM: Magnesium: 2.4 mg/dL (ref 1.7–2.4)

## 2021-11-06 LAB — HIV ANTIBODY (ROUTINE TESTING W REFLEX): HIV Screen 4th Generation wRfx: NONREACTIVE

## 2021-11-06 LAB — BASIC METABOLIC PANEL
Anion gap: 12 (ref 5–15)
BUN: 31 mg/dL — ABNORMAL HIGH (ref 6–20)
CO2: 22 mmol/L (ref 22–32)
Calcium: 7.9 mg/dL — ABNORMAL LOW (ref 8.9–10.3)
Chloride: 103 mmol/L (ref 98–111)
Creatinine, Ser: 1.15 mg/dL — ABNORMAL HIGH (ref 0.44–1.00)
GFR, Estimated: 58 mL/min — ABNORMAL LOW (ref >60)
Glucose, Bld: 152 mg/dL — ABNORMAL HIGH (ref 70–99)
Potassium: 4.2 mmol/L (ref 3.5–5.1)
Sodium: 137 mmol/L (ref 135–145)

## 2021-11-06 LAB — GLUCOSE, CAPILLARY
Glucose-Capillary: 141 mg/dL — ABNORMAL HIGH (ref 70–99)
Glucose-Capillary: 169 mg/dL — ABNORMAL HIGH (ref 70–99)
Glucose-Capillary: 135 mg/dL — ABNORMAL HIGH (ref 70–99)
Glucose-Capillary: 124 mg/dL — ABNORMAL HIGH (ref 70–99)
Glucose-Capillary: 129 mg/dL — ABNORMAL HIGH (ref 70–99)

## 2021-11-06 LAB — LACTIC ACID, PLASMA: Lactic Acid, Venous: 1 mmol/L (ref 0.5–1.9)

## 2021-11-06 LAB — PHOSPHORUS: Phosphorus: 5.4 mg/dL — ABNORMAL HIGH (ref 2.5–4.6)

## 2021-11-06 MED ORDER — ACETAMINOPHEN 10 MG/ML IV SOLN
1000.0000 mg | Freq: Four times a day (QID) | INTRAVENOUS | Status: AC
  Administered 2021-11-06 – 2021-11-07 (×4): 1000 mg via INTRAVENOUS
  Filled 2021-11-06 (×4): qty 100

## 2021-11-06 MED ORDER — PHENOL 1.4 % MT LIQD
1.0000 | OROMUCOSAL | Status: DC | PRN
  Administered 2021-11-06: 1 via OROMUCOSAL
  Filled 2021-11-06: qty 177

## 2021-11-06 MED ORDER — HYDROMORPHONE HCL 1 MG/ML IJ SOLN
0.5000 mg | INTRAMUSCULAR | Status: DC | PRN
  Administered 2021-11-06 – 2021-11-09 (×17): 1 mg via INTRAVENOUS
  Administered 2021-11-09: 0.5 mg via INTRAVENOUS
  Administered 2021-11-10 – 2021-11-14 (×18): 1 mg via INTRAVENOUS
  Administered 2021-11-14: 0.5 mg via INTRAVENOUS
  Filled 2021-11-06 (×39): qty 1

## 2021-11-06 MED ORDER — METHOCARBAMOL 1000 MG/10ML IJ SOLN
500.0000 mg | Freq: Four times a day (QID) | INTRAVENOUS | Status: DC
  Administered 2021-11-06 – 2021-11-11 (×20): 500 mg via INTRAVENOUS
  Filled 2021-11-06 (×2): qty 5
  Filled 2021-11-06 (×3): qty 500
  Filled 2021-11-06: qty 5
  Filled 2021-11-06: qty 500
  Filled 2021-11-06 (×3): qty 5
  Filled 2021-11-06: qty 500
  Filled 2021-11-06 (×2): qty 5
  Filled 2021-11-06: qty 500
  Filled 2021-11-06 (×2): qty 5
  Filled 2021-11-06 (×4): qty 500
  Filled 2021-11-06 (×6): qty 5
  Filled 2021-11-06: qty 500
  Filled 2021-11-06 (×2): qty 5

## 2021-11-06 MED ORDER — INSULIN ASPART 100 UNIT/ML IJ SOLN
0.0000 [IU] | INTRAMUSCULAR | Status: DC
  Administered 2021-11-06: 2 [IU] via SUBCUTANEOUS
  Administered 2021-11-06: 3 [IU] via SUBCUTANEOUS
  Administered 2021-11-06 (×3): 2 [IU] via SUBCUTANEOUS
  Administered 2021-11-08 – 2021-11-09 (×2): 3 [IU] via SUBCUTANEOUS
  Administered 2021-11-10 (×2): 2 [IU] via SUBCUTANEOUS
  Administered 2021-11-11: 3 [IU] via SUBCUTANEOUS
  Administered 2021-11-11 (×2): 2 [IU] via SUBCUTANEOUS

## 2021-11-06 MED ORDER — ENOXAPARIN SODIUM 40 MG/0.4ML IJ SOSY
40.0000 mg | PREFILLED_SYRINGE | INTRAMUSCULAR | Status: DC
  Administered 2021-11-06 – 2021-11-10 (×5): 40 mg via SUBCUTANEOUS
  Filled 2021-11-06 (×7): qty 0.4

## 2021-11-06 MED ORDER — HYDRALAZINE HCL 20 MG/ML IJ SOLN: 5.0000 mg | Freq: Four times a day (QID) | INTRAMUSCULAR | Status: DC | PRN

## 2021-11-06 NOTE — Plan of Care (Signed)
?  Problem: Education: ?Goal: Knowledge of General Education information will improve ?Description: Including pain rating scale, medication(s)/side effects and non-pharmacologic comfort measures ?11/06/2021 1651 by Santa Lighter, RN ?Outcome: Progressing ?11/06/2021 1613 by Santa Lighter, RN ?Outcome: Progressing ?  ?Problem: Health Behavior/Discharge Planning: ?Goal: Ability to manage health-related needs will improve ?11/06/2021 1651 by Santa Lighter, RN ?Outcome: Progressing ?11/06/2021 1613 by Santa Lighter, RN ?Outcome: Progressing ?  ?Problem: Clinical Measurements: ?Goal: Ability to maintain clinical measurements within normal limits will improve ?11/06/2021 1651 by Santa Lighter, RN ?Outcome: Progressing ?11/06/2021 1613 by Santa Lighter, RN ?Outcome: Progressing ?Goal: Will remain free from infection ?Outcome: Progressing ?  ?

## 2021-11-06 NOTE — Consult Note (Addendum)
WOC Nurse Consult Note: ?Surgical team following for assessment and plan of care for abd wound.  Requested to apply Vac dressing to full thickness midline abd post-op wound.  ?Measurement:21X5X5cm, beefy red interspersed with yellow adipose tissue. Pt medicated for pain and tolerated with mod amt discomfort.  Applied one piece black foam to 125m cont suction.  ? 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  No stool or flatus, small amt bloody liquid in the pouch. Current pouch was leaking behind the barrier.  Applied barrier ring to attempt to maintain a seal, and one piece flat pouch.  Ordered flexible convex pouches to the room for the next pouch change.  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 Discussed pouch change and I performed the procedure, but she did not participate. Stoma is located in close proximity to the Vac dressing and both will need to be changed Q M/W/F. WOC team will perform another pouch change and teaching session with Vac change on Fri. ?[place]. ?DJulien GirtMSN, RN, [place] [facility] ?[phone]  ? ? ?  ?

## 2021-11-06 NOTE — Progress Notes (Addendum)
? ? ?1 Day Post-Op  ?Subjective: ?Patient awake and extubated.  Throat pain secondary to NGT.  Abdominal pain well controlled.  Husband at bedside.  Explained to patient what we found in OR and what we did.   ? ?ROS: See above, otherwise other systems negative ? ?Objective: ?Vital signs in last 24 hours: ?Temp:  [97.6 ?F (36.4 ?C)-99.1 ?F (37.3 ?C)] 98.2 ?F (36.8 ?C) (03/29 0720) ?Pulse Rate:  [93-114] 97 (03/29 0700) ?Resp:  [13-26] 15 (03/29 0700) ?BP: (105-155)/(72-130) 138/77 (03/29 0700) ?SpO2:  [88 %-98 %] 89 % (03/29 0700) ?Arterial Line BP: (115-172)/(61-82) 157/62 (03/29 0700) ?Weight:  [88.5 kg-92.7 kg] 92.7 kg (03/28 2000) ?Last BM Date :  (PTA) ? ?Intake/Output from previous day: ?03/28 0701 - 03/29 0700 ?In: 2920.9 [I.V.:2373.2; IV Piggyback:547.7] ?Out: 1460 [Urine:1450; Blood:10] ?Intake/Output this shift: ?No intake/output data recorded. ? ?PE: ?Gen: NAD ?Heart: regular ?Lungs: CTAB ?Abd: soft, obese, appropriately tender, midline wound is clean and open, packed, good granulation tissue.  L-sided colostomy with viable stoma.  Serosang output in colostomy bag, but no flatus or stool output.  NGT with bilious output ?GU: foley with yellow urine present, 1400cc ? ?Lab Results:  ?Recent Labs  ?  11/05/21 ?1255 11/05/21 ?1655 11/06/21 ?0158  ?WBC 10.2  --  14.1*  ?HGB 11.1* 9.5* 9.2*  ?HCT 33.2* 28.0* 27.1*  ?PLT 414*  --  364  ? ?BMET ?Recent Labs  ?  11/05/21 ?1255 11/05/21 ?1655  ?NA 133* 132*  ?K 4.8 4.1  ?CL 96*  --   ?CO2 21*  --   ?GLUCOSE 151*  --   ?BUN 46*  --   ?CREATININE 2.17*  --   ?CALCIUM 8.1*  --   ? ?PT/INR ?No results for input(s): LABPROT, INR in the last 72 hours. ?CMP  ?   ?Component Value Date/Time  ? NA 132 (L) 11/05/2021 1655  ? K 4.1 11/05/2021 1655  ? CL 96 (L) 11/05/2021 1255  ? CO2 21 (L) 11/05/2021 1255  ? GLUCOSE 151 (H) 11/05/2021 1255  ? BUN 46 (H) 11/05/2021 1255  ? CREATININE 2.17 (H) 11/05/2021 1255  ? CALCIUM 8.1 (L) 11/05/2021 1255  ? PROT 6.0 (L) 11/05/2021 1255  ?  ALBUMIN 1.9 (L) 11/05/2021 1255  ? AST 32 11/05/2021 1255  ? ALT 22 11/05/2021 1255  ? ALKPHOS 138 (H) 11/05/2021 1255  ? BILITOT 0.7 11/05/2021 1255  ? GFRNONAA 26 (L) 11/05/2021 1255  ? ?Lipase  ?   ?Component Value Date/Time  ? LIPASE 21 11/05/2021 1255  ? ? ? ? ? ?Studies/Results: ?CT Angio Chest PE W/Cm &/Or Wo Cm ? ?Result Date: 11/05/2021 ?CLINICAL DATA:  Abdominal pain. Possible pulmonary embolus. Status post hysterectomy 10 days ago. EXAM: CT ANGIOGRAPHY CHEST CT ABDOMEN AND PELVIS WITH CONTRAST TECHNIQUE: Multidetector CT imaging of the chest was performed using the standard protocol during bolus administration of intravenous contrast. Multiplanar CT image reconstructions and MIPs were obtained to evaluate the vascular anatomy. Multidetector CT imaging of the abdomen and pelvis was performed using the standard protocol during bolus administration of intravenous contrast. RADIATION DOSE REDUCTION: This exam was performed according to the departmental dose-optimization program which includes automated exposure control, adjustment of the mA and/or kV according to patient size and/or use of iterative reconstruction technique. CONTRAST:  171m OMNIPAQUE IOHEXOL 350 MG/ML SOLN COMPARISON:  None. FINDINGS: CTA CHEST FINDINGS Cardiovascular: Satisfactory opacification of the pulmonary arteries to the segmental level. No evidence of pulmonary embolism. Normal heart size. No pericardial  effusion. Mediastinum/Nodes: No enlarged mediastinal, hilar, or axillary lymph nodes. Thyroid gland, trachea, and esophagus demonstrate no significant findings. Lungs/Pleura: No pneumothorax or pleural effusion is noted. Mild bibasilar subsegmental atelectasis is noted. Musculoskeletal: No chest wall abnormality. No acute or significant osseous findings. Review of the MIP images confirms the above findings. CT ABDOMEN and PELVIS FINDINGS Hepatobiliary: No focal liver abnormality is seen. No gallstones, gallbladder wall thickening, or  biliary dilatation. Pancreas: Unremarkable. No pancreatic ductal dilatation or surrounding inflammatory changes. Spleen: Normal in size without focal abnormality. Adrenals/Urinary Tract: Adrenal glands are unremarkable. Kidneys are normal, without renal calculi, focal lesion, or hydronephrosis. Bladder is unremarkable. Stomach/Bowel: The stomach appears normal. There is no evidence of bowel obstruction or inflammation. However, large pneumoperitoneum is noted most consistent with rupture of hollow viscus. There is noted fluid and probable stool within the pneumoperitoneum on the right side concerning for colonic perforation. There is also noted extensive subcutaneous emphysema involving the anterior and lateral abdominal walls. Vascular/Lymphatic: No significant vascular findings are present. No enlarged abdominal or pelvic lymph nodes. Reproductive: Status post hysterectomy. No adnexal masses. Other: 5.6 x 3.4 cm fluid collection is noted in the pelvis concerning for possible abscess. Moderate size periumbilical hernia is noted. Musculoskeletal: No acute or significant osseous findings. Review of the MIP images confirms the above findings. IMPRESSION: No definite evidence of pulmonary embolus. Extensive pneumoperitoneum is noted in the abdomen with large amount of fluid and possible stool seen in in the right side of the abdomen, highly concerning for rupture of hollow viscus. The patient is status post hysterectomy 11 days ago, but the amount of pneumoperitoneum appears to be inconsistent with at time. Critical Value/emergent results were called by telephone at the time of interpretation on 11/05/2021 at 2:35 pm to provider Fredia Sorrow , who verbally acknowledged these results. Also noted is 5.6 x 3.4 cm fluid collection in the pelvis concerning for possible abscess. Extensive subcutaneous emphysema is seen involving the subcutaneous tissues of the anterior and lateral abdominal walls as well. Electronically  Signed   By: Marijo Conception M.D.   On: 11/05/2021 14:36  ? ?CT Abdomen Pelvis W Contrast ? ?Result Date: 11/05/2021 ?CLINICAL DATA:  Abdominal pain. Possible pulmonary embolus. Status post hysterectomy 10 days ago. EXAM: CT ANGIOGRAPHY CHEST CT ABDOMEN AND PELVIS WITH CONTRAST TECHNIQUE: Multidetector CT imaging of the chest was performed using the standard protocol during bolus administration of intravenous contrast. Multiplanar CT image reconstructions and MIPs were obtained to evaluate the vascular anatomy. Multidetector CT imaging of the abdomen and pelvis was performed using the standard protocol during bolus administration of intravenous contrast. RADIATION DOSE REDUCTION: This exam was performed according to the departmental dose-optimization program which includes automated exposure control, adjustment of the mA and/or kV according to patient size and/or use of iterative reconstruction technique. CONTRAST:  151m OMNIPAQUE IOHEXOL 350 MG/ML SOLN COMPARISON:  None. FINDINGS: CTA CHEST FINDINGS Cardiovascular: Satisfactory opacification of the pulmonary arteries to the segmental level. No evidence of pulmonary embolism. Normal heart size. No pericardial effusion. Mediastinum/Nodes: No enlarged mediastinal, hilar, or axillary lymph nodes. Thyroid gland, trachea, and esophagus demonstrate no significant findings. Lungs/Pleura: No pneumothorax or pleural effusion is noted. Mild bibasilar subsegmental atelectasis is noted. Musculoskeletal: No chest wall abnormality. No acute or significant osseous findings. Review of the MIP images confirms the above findings. CT ABDOMEN and PELVIS FINDINGS Hepatobiliary: No focal liver abnormality is seen. No gallstones, gallbladder wall thickening, or biliary dilatation. Pancreas: Unremarkable. No pancreatic ductal dilatation or  surrounding inflammatory changes. Spleen: Normal in size without focal abnormality. Adrenals/Urinary Tract: Adrenal glands are unremarkable. Kidneys are  normal, without renal calculi, focal lesion, or hydronephrosis. Bladder is unremarkable. Stomach/Bowel: The stomach appears normal. There is no evidence of bowel obstruction or inflammation. However, l

## 2021-11-06 NOTE — Progress Notes (Signed)
GYN PROGRESS NOTE ? [name] is a 56 y.o.  ?[id number] ? ?Post Op Date # 1 ? ?Subjective: ?Patient is extubated, and talking. She notes it hurts when talking.  ? ?Objective: ?Vital signs in last 24 hours: ?Temp:  [97.6 ?F (36.4 ?C)-99.1 ?F (37.3 ?C)] 98.2 ?F (36.8 ?C) (03/29 0720) ?Pulse Rate:  [87-114] 97 (03/29 1000) ?Resp:  [13-26] 21 (03/29 1000) ?BP: (96-155)/(68-130) 96/68 (03/29 1000) ?SpO2:  [88 %-98 %] 93 % (03/29 1000) ?Arterial Line BP: (115-224)/(50-82) 144/61 (03/29 1000) ?Weight:  [88.5 kg-92.7 kg] 92.7 kg (03/28 2000) ? ?Intake/Output from previous day: ?03/28 0701 - 03/29 0700 ?In: 3005.8 [I.V.:2458.2] ?Out: 1460 [Urine:1450] ?Intake/Output this shift: ?Total I/O ?In: 240.2 [I.V.:211.9; IV Piggyback:28.3] ?Out: -  ?Recent Labs  ?Lab 11/05/21 ?1255 11/05/21 ?1655 11/06/21 ?0158 11/06/21 ?0825  ?WBC 10.2  --  14.1* 14.6*  ?HGB 11.1* 9.5* 9.2* 8.7*  ?HCT 33.2* 28.0* 27.1* 26.4*  ?PLT 414*  --  364 354  ? ?  ?Recent Labs  ?Lab 11/05/21 ?1255 11/05/21 ?1655 11/06/21 ?0825  ?NA 133* 132* 137  ?K 4.8 4.1 4.2  ?CL 96*  --  103  ?CO2 21*  --  22  ?BUN 46*  --  31*  ?CREATININE 2.17*  --  1.15*  ?CALCIUM 8.1*  --  7.9*  ?PROT 6.0*  --   --   ?BILITOT 0.7  --   --   ?ALKPHOS 138*  --   --   ?ALT 22  --   --   ?AST 32  --   --   ?GLUCOSE 151*  --  152*  ?  ?EXAM: ?Deferred ? ? ?Assessment/Plan ?56 yo POD #1 s/p ex lap bowel resection and colostomy placement for perforated diverticulitis ?Appreciate the great care by General Surgery.  ?Discussed with PA and will transfer care to General Surgery for management ?Patient to be transferred from medical ICU to surgical floor today.  ?Will continue to follow clinical course of patient.  ? ? ? ? LOS: 1 day  ? ? ?Daijha Leggio,MD 11/06/2021 10:10 AM  ?

## 2021-11-06 NOTE — Evaluation (Signed)
Physical Therapy Evaluation ?Patient Details ?Name: Michele [name] ?MRN: [id number] ?DOB: 08-May-1966 ?Today's Date: 11/06/2021 ? ?History of Present Illness ? 56yo F 10D S/P laparoscopic converted to open hysterectomy, presents to [name] on 3/28 from GYN office for decreased appetite, SOB, redness to abdominal wall. CT abdomen shows significant pneumoperitoneum with possible bowel perforation, s/p ex lap, sigmoid colectomy, and colostomy on 3/28. PMH includes anxiety, anemia, HTN.  ?Clinical Impression ?  ?Pt presents with abdominal pain post-op and impaired activity tolerance. Pt to benefit from acute PT to address deficits. Pt ambulated hallway distance with use of SL support of IV pole, distance limited by abdominal discomfort. Pt complaining of shortness of breath only when transitioning supine<>sit, otherwise pt reports feeling better post-op vs pre-. PT anticipates pt will progress well with mobility acutely. PT to progress mobility as tolerated, and will continue to follow acutely.  ?   ?   ? ?Recommendations for follow up therapy are one component of a multi-disciplinary discharge planning process, led by the attending physician.  Recommendations may be updated based on patient status, additional functional criteria and insurance authorization. ? ?Follow Up Recommendations No PT follow up ? ?  ?Assistance Recommended at Discharge Intermittent Supervision/Assistance  ?Patient can return home with the following ? A little help with walking and/or transfers;A little help with bathing/dressing/bathroom;Assist for transportation ? ?  ?Equipment Recommendations None recommended by PT  ?Recommendations for Other Services ?    ?  ?Functional Status Assessment Patient has had a recent decline in their functional status and demonstrates the ability to make significant improvements in function in a reasonable and predictable amount of time.  ? ?  ?Precautions / Restrictions Precautions ?Precautions: Other  (comment) ?Precaution Comments: wound vac abdomen, colostomy, on 3LO2 (removed during session, tolerated well), NGT ?Restrictions ?Weight Bearing Restrictions: No  ? ?  ? ?Mobility ? Bed Mobility ?Overal bed mobility: Needs Assistance ?Bed Mobility: Rolling, Sidelying to Sit, Sit to Supine ?Rolling: Min assist ?Sidelying to sit: Min assist, HOB elevated ?  ?Sit to supine: Min assist, HOB elevated ?  ?General bed mobility comments: assist for roll to L and trunk elevation to come to EOB, assist for bringing LEs back into bed and positioning upon return to supine. ?  ? ?Transfers ?Overall transfer level: Needs assistance ?Equipment used: None ?Transfers: Sit to/from Stand ?Sit to Stand: Min assist ?  ?  ?  ?  ?  ?General transfer comment: assist to steady once rising, no physical assist ?  ? ?Ambulation/Gait ?Ambulation/Gait assistance: Supervision ?Gait Distance (Feet): 90 Feet ?Assistive device: IV Pole ?Gait Pattern/deviations: Step-through pattern, Decreased stride length, Trunk flexed ?Gait velocity: decr ?  ?  ?General Gait Details: for safety only, cues to hallway and IV pole navigation ? ?Stairs ?  ?  ?  ?  ?  ? ?Wheelchair Mobility ?  ? ?Modified Rankin (Stroke Patients Only) ?  ? ?  ? ?Balance Overall balance assessment: Mild deficits observed, not formally tested ?  ?  ?  ?  ?  ?  ?  ?  ?  ?  ?  ?  ?  ?  ?  ?  ?  ?  ?   ? ? ? ?Pertinent Vitals/Pain Pain Assessment ?Pain Assessment: Faces ?Faces Pain Scale: Hurts little more ?Pain Location: abdomen ?Pain Descriptors / Indicators: Sore, Discomfort ?Pain Intervention(s): Limited activity within patient's tolerance, Monitored during session, Repositioned  ? ? ?Home Living Family/patient expects to be discharged to:: Private  residence ?Living Arrangements: Alone;Children (2 teenage sons) ?Available Help at Discharge: Family;Available PRN/intermittently ?Type of Home: House ?Home Access: Level entry ?  ?  ?  ?Home Layout: One level ?Home Equipment: Shower  seat ?Additional Comments: has elevating head of bed  ?  ?Prior Function Prior Level of Function : Independent/Modified Independent ?  ?  ?  ?  ?  ?  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  ?  ? ? ?Hand Dominance  ? Dominant Hand: Right ? ?  ?Extremity/Trunk Assessment  ? Upper Extremity Assessment ?Upper Extremity Assessment: Defer to OT evaluation ?  ? ?Lower Extremity Assessment ?Lower Extremity Assessment: Overall WFL for tasks assessed ?  ? ?Cervical / Trunk Assessment ?Cervical / Trunk Assessment: Other exceptions ?Cervical / Trunk Exceptions: abd surgery  ?Communication  ? Communication: No difficulties  ?Cognition Arousal/Alertness: Awake/alert ?Behavior During Therapy: [name] for tasks assessed/performed ?Overall Cognitive Status: Impaired/Different from baseline ?  ?  ?  ?  ?  ?  ?  ?  ?  ?  ?  ?  ?  ?  ?  ?  ?General Comments: some confusion intermittently, anticipate secondary to pain medications. Otherwise pleasant and appropriate throughout sesssion. ?  ?  ? ?  ?General Comments   ? ?  ?Exercises    ? ?Assessment/Plan  ?  ?PT Assessment Patient needs continued PT services  ?PT Problem List Decreased mobility;Decreased balance;Decreased knowledge of use of DME;Pain;Decreased activity tolerance ? ?   ?  ?PT Treatment Interventions DME instruction;Therapeutic activities;Gait training;Patient/family education;Therapeutic exercise;Balance training;Functional mobility training   ? ?PT Goals (Current goals can be found in the Care Plan section)  ?Acute Rehab PT Goals ?Patient Stated Goal: home, feel better ?PT Goal Formulation: With patient ?Time For Goal Achievement: 11/20/21 ?Potential to Achieve Goals: Good ? ?  ?Frequency Min 3X/week ?  ? ? ?Co-evaluation   ?  ?  ?  ?  ? ? ?  ?AM-PAC PT "6 Clicks" Mobility  ?Outcome Measure Help needed turning from your back to your side while in a flat bed without using bedrails?: A Little ?Help needed moving from lying on your back to sitting  on the side of a flat bed without using bedrails?: A Little ?Help needed moving to and from a bed to a chair (including a wheelchair)?: A Little ?Help needed standing up from a chair using your arms (e.g., wheelchair or bedside chair)?: A Little ?Help needed to walk in hospital room?: A Little ?Help needed climbing 3-5 steps with a railing? : A Lot ?6 Click Score: 17 ? ?  ?End of Session   ?Activity Tolerance: Patient tolerated treatment well;Patient limited by fatigue ?Patient left: in bed;with call bell/phone within reach;with nursing/sitter in room (NT at bedside) ?Nurse Communication: Mobility status ?PT Visit Diagnosis: Other abnormalities of gait and mobility (R26.89);Difficulty in walking, not elsewhere classified (R26.2);Pain ?Pain - Right/Left:  (mid) ?Pain - part of body:  (abdomen) ?  ? ?Time: 1529-1600 ?PT Time Calculation (min) (ACUTE ONLY): 31 min ? ? ?Charges:   PT Evaluation ?$PT Eval Low Complexity: 1 Low ?PT Treatments ?$Gait Training: 8-22 mins ?  ?   ? ?Stacie Glaze, PT DPT ?Acute Rehabilitation Services ?Pager [phone]  ?Office [phone] ? ? ?Nazier Neyhart E Stroup ?11/06/2021, 4:43 PM ? ?

## 2021-11-06 NOTE — Progress Notes (Signed)
Stable patient arrived to the floor around 1250. Call bell within reach and patient resting. ?

## 2021-11-06 NOTE — Plan of Care (Signed)

## 2021-11-06 NOTE — Progress Notes (Signed)
? ?  NAME:  Michele [name], MRN:  [id number], DOB:  [birth year], LOS: 1 ?ADMISSION DATE:  11/05/2021, CONSULTATION DATE:  11/05/21 ?REFERRING MD:  Alwyn Pea, CHIEF COMPLAINT:  Post op management  ? ?History of Present Illness:  ?76 yof who presented to GYN surg on POD 10 after Laparoscopic ->converted to open hysterectomy (10/26/21) w/ cc: abd wall pain/redness, dec appetite and SOB. Sent to ER ->CT imaging neg for PE but showed pneumoperitoneum w/ concern for bowel perforation. Was admitted to [facility], intubated for general anesthesia and now presents to to the ICU post op after exploratory laparotomy with sigmoid colectomy and hartmans procedure. ? ?PCCM asked to admit to ICU post operatively. ? ?Pertinent  Medical History  ?Anemia, anxiety, HTN w/pregnancy  ? ?Significant Hospital Events: ?Including procedures, antibiotic start and stop dates in addition to other pertinent events   ?3/28 admit. ?3/29 No issues overnight  ? ?Interim History / Subjective:  ?She reports sore throat but not other complaints  ? ?Objective   ?Blood pressure (!) 149/74, pulse 95, temperature 99.1 ?F (37.3 ?C), temperature source Axillary, resp. rate 14, height 5' (1.524 m), weight 92.7 kg, last menstrual period 10/07/2021, SpO2 90 %. ?   ?   ? ?Intake/Output Summary (Last 24 hours) at 11/06/2021 0705 ?Last data filed at 11/06/2021 0600 ?Gross per 24 hour  ?Intake 2920.86 ml  ?Output 1460 ml  ?Net 1460.86 ml  ? ? ?Filed Weights  ? 11/05/21 1226 11/05/21 1545 11/05/21 2000  ?Weight: 88.5 kg 88.5 kg 92.7 kg  ? ? ?Examination: ?General: Acute ill appearing elderly Middle aged female lying in bed, in NAD ?HEENT: [place]/AT, NGT to LIWS MM pink/moist, PERRL,  ?Neuro: Alert and oriented, non-focal  ?CV: s1s2 regular rate and rhythm, no murmur, rubs, or gallops,  ?PULM:  Clear to ascultation, no increased work of breathing, no added breath sounds  ?GI: soft, bowel sounds hypoactive in all 4 quadrants, tender to palpation, non-distended ?Extremities: warm/dry,  no edema  ?Skin: no rashes or lesions ? ?Assessment & Plan:  ? ?Sepsis  ?- 2/2 bowel perforation w/ abdominal peritonitis now s/p exploratory lap for perforated sigmoid colon w/ washout, colectomy and colostomy. ?Perforated bowel  ?-No s/p colectomy and colonoscopy  ?P: ?Remains critically ill in the  ICU ?Supplemental oxygen for sat goal > 90% ?Follow culutres ?Continue Zosyn  ?MAP goal < 65  ?IV hydration  ?Monitor urine output ?Ensure adequate pain control  ? ?AKI  ?- presumed 2/2 sepsis, hypovolemia. ?Hypocalcemia ?P: ?AM bmet pending  ?Follow renal function  ?Monitor urine output ?Trend Bmet ?Avoid nephrotoxins ?Ensure adequate renal perfusion  ?IV hydration ? ?Hyperglycemia ?P: ?Continue SSI  ?CBG goal 140-180 ?CBG checks q4 ? ?Hx HTN, HLD ?P: ?Resume home Crestor when able  ? ?Hx anxiety ?P: ?Resume home Adderall and Citalopram when able  ? ?PCCM will sign off. Thank you for the opportunity to participate in this patient's care. Please contact if we can be of further assistance. ? ?Best Practice (right click and "Reselect all SmartList Selections" daily)  ?Per primary  ? ?Critical care time: NA  ?Adea Geisel D. Harris, NP-C ?[facility] ?Personal contact information can be found on Amion  ?11/06/2021, 7:17 AM ? ? ? ? ? ?

## 2021-11-07 LAB — GLUCOSE, CAPILLARY
Glucose-Capillary: 116 mg/dL — ABNORMAL HIGH (ref 70–99)
Glucose-Capillary: 102 mg/dL — ABNORMAL HIGH (ref 70–99)
Glucose-Capillary: 92 mg/dL (ref 70–99)
Glucose-Capillary: 95 mg/dL (ref 70–99)
Glucose-Capillary: 72 mg/dL (ref 70–99)
Glucose-Capillary: 85 mg/dL (ref 70–99)

## 2021-11-07 LAB — BASIC METABOLIC PANEL
Anion gap: 9 (ref 5–15)
BUN: 29 mg/dL — ABNORMAL HIGH (ref 6–20)
CO2: 27 mmol/L (ref 22–32)
Calcium: 8.1 mg/dL — ABNORMAL LOW (ref 8.9–10.3)
Chloride: 103 mmol/L (ref 98–111)
Creatinine, Ser: 0.89 mg/dL (ref 0.44–1.00)
GFR, Estimated: >60 mL/min (ref >60)
Glucose, Bld: 114 mg/dL — ABNORMAL HIGH (ref 70–99)
Potassium: 4.4 mmol/L (ref 3.5–5.1)
Sodium: 139 mmol/L (ref 135–145)

## 2021-11-07 LAB — CBC
HCT: 25.5 % — ABNORMAL LOW (ref 36.0–46.0)
Hemoglobin: 8.6 g/dL — ABNORMAL LOW (ref 12.0–15.0)
MCH: 26.1 pg (ref 26.0–34.0)
MCHC: 33.7 g/dL (ref 30.0–36.0)
MCV: 77.5 fL — ABNORMAL LOW (ref 80.0–100.0)
Platelets: 360 10*3/uL (ref 150–400)
RBC: 3.29 MIL/uL — ABNORMAL LOW (ref 3.87–5.11)
RDW: 15 % (ref 11.5–15.5)
WBC: 18.1 10*3/uL — ABNORMAL HIGH (ref 4.0–10.5)
nRBC: 0 % (ref 0.0–0.2)

## 2021-11-07 LAB — SURGICAL PATHOLOGY

## 2021-11-07 LAB — PREALBUMIN: Prealbumin: <5 mg/dL — ABNORMAL LOW (ref 18–38)

## 2021-11-07 MED ORDER — LIDOCAINE 5 % EX PTCH
1.0000 | MEDICATED_PATCH | CUTANEOUS | Status: DC
  Administered 2021-11-07 – 2021-11-29 (×20): 1 via TRANSDERMAL
  Filled 2021-11-07 (×20): qty 1

## 2021-11-07 MED ORDER — ACETAMINOPHEN 10 MG/ML IV SOLN
1000.0000 mg | Freq: Four times a day (QID) | INTRAVENOUS | Status: AC
  Administered 2021-11-07 – 2021-11-08 (×4): 1000 mg via INTRAVENOUS
  Filled 2021-11-07 (×4): qty 100

## 2021-11-07 MED ORDER — SODIUM CHLORIDE 0.9 % IV SOLN: INTRAVENOUS | Status: DC

## 2021-11-07 NOTE — Progress Notes (Signed)
Physical Therapy Treatment ?Patient Details ?Name: Michele [name] ?MRN: [id number] ?DOB: August 21, 1965 ?Today's Date: 11/07/2021 ? ? ?History of Present Illness 56yo F 10D S/P laparoscopic converted to open hysterectomy, presents to [facility] ED on 3/28 from GYN office for decreased appetite, SOB, redness to abdominal wall. CT abdomen shows significant pneumoperitoneum with possible bowel perforation, s/p ex lap, sigmoid colectomy, and colostomy on 3/28. PMH includes anxiety, anemia, HTN. ? ?  ?PT Comments  ? ? Pt received supine and agreeable to session needing grossly min assist down to supervision for functional mobility. Pt educated on log rolling technique for out of/into bed with good result and pt reporting decrease in pain that normally arises from exiting/entering bed. Pt supervision during ambulation with IV pole with assist needed only for lines. Pt continues to be limited by general fatigue but motivated to continue mobilization. Pt continues to benefit from skilled PT services to progress toward functional mobility goals.  ?  ?Recommendations for follow up therapy are one component of a multi-disciplinary discharge planning process, led by the attending physician.  Recommendations may be updated based on patient status, additional functional criteria and insurance authorization. ? ?Follow Up Recommendations ? No PT follow up ?  ?  ?Assistance Recommended at Discharge Intermittent Supervision/Assistance  ?Patient can return home with the following A little help with walking and/or transfers;A little help with bathing/dressing/bathroom;Assist for transportation ?  ?Equipment Recommendations ? None recommended by PT  ?  ?Recommendations for Other Services   ? ? ?  ?Precautions / Restrictions Precautions ?Precautions: Other (comment) ?Precaution Comments: wound vac abdomen, colostomy, on 3LO2 (removed during session, tolerated well), NGT ?Restrictions ?Weight Bearing Restrictions: No  ?  ? ?Mobility ? Bed  Mobility ?Overal bed mobility: Needs Assistance ?Bed Mobility: Rolling, Sidelying to Sit, Sit to Sidelying ?Rolling: Min guard ?Sidelying to sit: Min assist ?  ?  ?Sit to sidelying: Min assist ?General bed mobility comments: assist for log rolling technique to L and trunk elevation to come to EOB, assist for bringing LEs back into bed and positioning upon return to supine. ?  ? ?Transfers ?Overall transfer level: Needs assistance ?Equipment used: None ?Transfers: Sit to/from Stand ?Sit to Stand: Min assist ?  ?  ?  ?  ?  ?General transfer comment: assist to steady once rising, no physical assist ?  ? ?Ambulation/Gait ?Ambulation/Gait assistance: Supervision ?Gait Distance (Feet): 120 Feet ?Assistive device: IV Pole ?Gait Pattern/deviations: Step-through pattern, Decreased stride length, Trunk flexed ?Gait velocity: decr ?  ?  ?General Gait Details: for safety only, cues to hallway and IV pole navigation ? ? ?Stairs ?  ?  ?  ?  ?  ? ? ?Wheelchair Mobility ?  ? ?Modified Rankin (Stroke Patients Only) ?  ? ? ?  ?Balance   ?  ?  ?  ?  ?  ?  ?  ?  ?  ?  ?  ?  ?  ?  ?  ?  ?  ?  ?  ? ?  ?Cognition Arousal/Alertness: Awake/alert ?Behavior During Therapy: [facility] for tasks assessed/performed ?Overall Cognitive Status: Impaired/Different from baseline ?  ?  ?  ?  ?  ?  ?  ?  ?  ?  ?  ?  ?  ?  ?  ?  ?  ?  ?  ? ?  ?Exercises   ? ?  ?General Comments General comments (skin integrity, edema, etc.): VSS on RA ?  ?  ? ?Pertinent Vitals/Pain  Pain Assessment ?Pain Assessment: Faces ?Faces Pain Scale: Hurts even more ?Pain Location: low back ?Pain Descriptors / Indicators: Sore, Discomfort ?Pain Intervention(s): Limited activity within patient's tolerance, Monitored during session, Repositioned  ? ? ?Home Living   ?  ?  ?  ?  ?  ?  ?  ?  ?  ?   ?  ?Prior Function    ?  ?  ?   ? ?PT Goals (current goals can now be found in the care plan section) Acute Rehab PT Goals ?PT Goal Formulation: With patient ?Time For Goal Achievement:  11/20/21 ? ?  ?Frequency ? ? ? Min 3X/week ? ? ? ?  ?PT Plan    ? ? ?Co-evaluation   ?  ?  ?  ?  ? ?  ?AM-PAC PT "6 Clicks" Mobility   ?Outcome Measure ? Help needed turning from your back to your side while in a flat bed without using bedrails?: A Little ?Help needed moving from lying on your back to sitting on the side of a flat bed without using bedrails?: A Little ?Help needed moving to and from a bed to a chair (including a wheelchair)?: A Little ?Help needed standing up from a chair using your arms (e.g., wheelchair or bedside chair)?: A Little ?Help needed to walk in hospital room?: A Little ?Help needed climbing 3-5 steps with a railing? : A Lot ?6 Click Score: 17 ? ?  ?End of Session Equipment Utilized During Treatment: Gait belt (under axillae) ?Activity Tolerance: Patient tolerated treatment well;Patient limited by fatigue ?Patient left: in bed;with call bell/phone within reach;with nursing/sitter in room ?Nurse Communication: Mobility status ?PT Visit Diagnosis: Other abnormalities of gait and mobility (R26.89);Difficulty in walking, not elsewhere classified (R26.2);Pain ?  ? ? ?Time: 7564-3329 ?PT Time Calculation (min) (ACUTE ONLY): 24 min ? ?Charges:  $Gait Training: 8-22 mins ?$Therapeutic Activity: 8-22 mins          ?          ? ?Audry Riles. PTA ?Acute Rehabilitation Services ?Office: [phone] ? ? ? ?Betsey Holiday Lamario Mani ?11/07/2021, 4:13 PM ? ?

## 2021-11-07 NOTE — Progress Notes (Signed)
? ? ?2 Days Post-Op  ?Subjective: ?Cc upper extremity swelling and left sided abd pain/back pain that is worse with inspiration/movement. Still feels her pain is better now than pre-op. Denies flatus/stool in ostomy.  ? ?ROS: See above, otherwise other systems negative ? ?Objective: ?Vital signs in last 24 hours: ?Temp:  [97.6 ?F (36.4 ?C)-98.2 ?F (36.8 ?C)] 97.7 ?F (36.5 ?C) (03/30 3428) ?Pulse Rate:  [82-97] 82 (03/30 0812) ?Resp:  [15-26] 18 (03/30 7681) ?BP: (96-140)/(68-88) 133/82 (03/30 1572) ?SpO2:  [93 %-99 %] 99 % (03/30 0812) ?Arterial Line BP: (144-155)/(58-61) 150/58 (03/29 1130) ?Last BM Date :  (PTA) ? ?Intake/Output from previous day: ?03/29 0701 - 03/30 0700 ?In: 1650.8 [P.O.:120; I.V.:1126.1; IV Piggyback:404.7] ?Out: 1000 [Urine:1000] ?Intake/Output this shift: ?Total I/O ?In: 286.5 [I.V.:156.6; IV Piggyback:129.8] ?Out: -  ? ?PE: ?Gen: NAD ?Heart: regular ?Lungs: CTAB ?Abd: soft, obese, appropriately tender, midline wound is clean and open, packed. L-sided colostomy - stoma retracted but viable. [place] output in colostomy bag, but no flatus or stool output.  NGT with bilious output ?GU: foley with yellow urine present, > 1000cc ? ?Lab Results:  ?Recent Labs  ?  11/06/21 ?0825 11/07/21 ?0719  ?WBC 14.6* 18.1*  ?HGB 8.7* 8.6*  ?HCT 26.4* 25.5*  ?PLT 354 360  ? ?BMET ?Recent Labs  ?  11/06/21 ?0825 11/07/21 ?0719  ?NA 137 139  ?K 4.2 4.4  ?CL 103 103  ?CO2 22 27  ?GLUCOSE 152* 114*  ?BUN 31* 29*  ?CREATININE 1.15* 0.89  ?CALCIUM 7.9* 8.1*  ? ?CMP  ?   ?Component Value Date/Time  ? NA 139 11/07/2021 0719  ? K 4.4 11/07/2021 0719  ? CL 103 11/07/2021 0719  ? CO2 27 11/07/2021 0719  ? GLUCOSE 114 (H) 11/07/2021 0719  ? BUN 29 (H) 11/07/2021 0719  ? CREATININE 0.89 11/07/2021 0719  ? CALCIUM 8.1 (L) 11/07/2021 0719  ? PROT 6.0 (L) 11/05/2021 1255  ? ALBUMIN 1.9 (L) 11/05/2021 1255  ? AST 32 11/05/2021 1255  ? ALT 22 11/05/2021 1255  ? ALKPHOS 138 (H) 11/05/2021 1255  ? BILITOT 0.7 11/05/2021 1255  ?  GFRNONAA >60 11/07/2021 0719  ? ?Lipase  ?   ?Component Value Date/Time  ? LIPASE 21 11/05/2021 1255  ? ? ?Studies/Results: ?CT Angio Chest PE W/Cm &/Or Wo Cm ? ?Result Date: 11/05/2021 ?CLINICAL DATA:  Abdominal pain. Possible pulmonary embolus. Status post hysterectomy 10 days ago. EXAM: CT ANGIOGRAPHY CHEST CT ABDOMEN AND PELVIS WITH CONTRAST TECHNIQUE: Multidetector CT imaging of the chest was performed using the standard protocol during bolus administration of intravenous contrast. Multiplanar CT image reconstructions and MIPs were obtained to evaluate the vascular anatomy. Multidetector CT imaging of the abdomen and pelvis was performed using the standard protocol during bolus administration of intravenous contrast. RADIATION DOSE REDUCTION: This exam was performed according to the departmental dose-optimization program which includes automated exposure control, adjustment of the mA and/or kV according to patient size and/or use of iterative reconstruction technique. CONTRAST:  164m OMNIPAQUE IOHEXOL 350 MG/ML SOLN COMPARISON:  None. FINDINGS: CTA CHEST FINDINGS Cardiovascular: Satisfactory opacification of the pulmonary arteries to the segmental level. No evidence of pulmonary embolism. Normal heart size. No pericardial effusion. Mediastinum/Nodes: No enlarged mediastinal, hilar, or axillary lymph nodes. Thyroid gland, trachea, and esophagus demonstrate no significant findings. Lungs/Pleura: No pneumothorax or pleural effusion is noted. Mild bibasilar subsegmental atelectasis is noted. Musculoskeletal: No chest wall abnormality. No acute or significant osseous findings. Review of the MIP images confirms the above  findings. CT ABDOMEN and PELVIS FINDINGS Hepatobiliary: No focal liver abnormality is seen. No gallstones, gallbladder wall thickening, or biliary dilatation. Pancreas: Unremarkable. No pancreatic ductal dilatation or surrounding inflammatory changes. Spleen: Normal in size without focal abnormality.  Adrenals/Urinary Tract: Adrenal glands are unremarkable. Kidneys are normal, without renal calculi, focal lesion, or hydronephrosis. Bladder is unremarkable. Stomach/Bowel: The stomach appears normal. There is no evidence of bowel obstruction or inflammation. However, large pneumoperitoneum is noted most consistent with rupture of hollow viscus. There is noted fluid and probable stool within the pneumoperitoneum on the right side concerning for colonic perforation. There is also noted extensive subcutaneous emphysema involving the anterior and lateral abdominal walls. Vascular/Lymphatic: No significant vascular findings are present. No enlarged abdominal or pelvic lymph nodes. Reproductive: Status post hysterectomy. No adnexal masses. Other: 5.6 x 3.4 cm fluid collection is noted in the pelvis concerning for possible abscess. Moderate size periumbilical hernia is noted. Musculoskeletal: No acute or significant osseous findings. Review of the MIP images confirms the above findings. IMPRESSION: No definite evidence of pulmonary embolus. Extensive pneumoperitoneum is noted in the abdomen with large amount of fluid and possible stool seen in in the right side of the abdomen, highly concerning for rupture of hollow viscus. The patient is status post hysterectomy 11 days ago, but the amount of pneumoperitoneum appears to be inconsistent with at time. Critical Value/emergent results were called by telephone at the time of interpretation on 11/05/2021 at 2:35 pm to provider Fredia Sorrow , who verbally acknowledged these results. Also noted is 5.6 x 3.4 cm fluid collection in the pelvis concerning for possible abscess. Extensive subcutaneous emphysema is seen involving the subcutaneous tissues of the anterior and lateral abdominal walls as well. Electronically Signed   By: Marijo Conception M.D.   On: 11/05/2021 14:36  ? ?CT Abdomen Pelvis W Contrast ? ?Result Date: 11/05/2021 ?CLINICAL DATA:  Abdominal pain. Possible  pulmonary embolus. Status post hysterectomy 10 days ago. EXAM: CT ANGIOGRAPHY CHEST CT ABDOMEN AND PELVIS WITH CONTRAST TECHNIQUE: Multidetector CT imaging of the chest was performed using the standard protocol during bolus administration of intravenous contrast. Multiplanar CT image reconstructions and MIPs were obtained to evaluate the vascular anatomy. Multidetector CT imaging of the abdomen and pelvis was performed using the standard protocol during bolus administration of intravenous contrast. RADIATION DOSE REDUCTION: This exam was performed according to the departmental dose-optimization program which includes automated exposure control, adjustment of the mA and/or kV according to patient size and/or use of iterative reconstruction technique. CONTRAST:  118m OMNIPAQUE IOHEXOL 350 MG/ML SOLN COMPARISON:  None. FINDINGS: CTA CHEST FINDINGS Cardiovascular: Satisfactory opacification of the pulmonary arteries to the segmental level. No evidence of pulmonary embolism. Normal heart size. No pericardial effusion. Mediastinum/Nodes: No enlarged mediastinal, hilar, or axillary lymph nodes. Thyroid gland, trachea, and esophagus demonstrate no significant findings. Lungs/Pleura: No pneumothorax or pleural effusion is noted. Mild bibasilar subsegmental atelectasis is noted. Musculoskeletal: No chest wall abnormality. No acute or significant osseous findings. Review of the MIP images confirms the above findings. CT ABDOMEN and PELVIS FINDINGS Hepatobiliary: No focal liver abnormality is seen. No gallstones, gallbladder wall thickening, or biliary dilatation. Pancreas: Unremarkable. No pancreatic ductal dilatation or surrounding inflammatory changes. Spleen: Normal in size without focal abnormality. Adrenals/Urinary Tract: Adrenal glands are unremarkable. Kidneys are normal, without renal calculi, focal lesion, or hydronephrosis. Bladder is unremarkable. Stomach/Bowel: The stomach appears normal. There is no evidence of  bowel obstruction or inflammation. However, large pneumoperitoneum is noted most consistent with  rupture of hollow viscus. There is noted fluid and probable stool within the pneumoperitoneum on the right side

## 2021-11-07 NOTE — Progress Notes (Addendum)
Initial Nutrition Assessment ? ?DOCUMENTATION CODES:  ? ?Obesity unspecified ? ?INTERVENTION:  ?Provided handout from Academy [employer], "Colostomy Nutrition Therapy" for patient and provided diet education  ? ?Refer patient to outpatient dietitian per patient request  ? ?Monitor diet advancement ? ? ?NUTRITION DIAGNOSIS:  ? ?Inadequate oral intake related to inability to eat as evidenced by NPO status. ? ? ?GOAL:  ? ?Patient will meet greater than or equal to 90% of their needs ? ? ?MONITOR:  ? ?Diet advancement, Labs, I & O's, Weight trends ? ?REASON FOR ASSESSMENT:  ? ?NPO/Clear Liquid Diet, Other (Comment) (new colostomy) ?  ? ?ASSESSMENT:  ? ?Patient is a 56 year old female who presented from GYN office for decreased appetite, SOB, redness to abdominal wall and was admitted after CT abdomen findings significant for pneumoperitoneum with possible bowel perforation. Past medical history includes anxiety, anemia, HTN. ? ?3/28 - ex lap bowel resection, sigmoid colectomy, and colostomy procedure for perforated diverticulitis  ? ?Met with pt at bedside. Per pt, pt reports having some mild abdominal discomfort and states that it isn't painful, it just feels like gas. Pt denies any nausea or vomiting. Pt reports that she is really thirsty and only receiving ice chips at this time. Pt reports that after her hysterectomy on 3/17, she has had decreased appetite due to having problems following the procedure. Prior to admission, pt reports that she doesn't eat a lot but she tries to eat consistent meals throughout the day. See 24-hour recall below:  ? ?24-hour recall PTA: ?Breakfast: iced coffee with cream, and nutri-system muffin ?Lunch: Can of progressive soup or ramen noodles ?Dinner: Low sodium [place] on [employer] with tortilla chips  ? ?Pt also expresses that she needs to do better with making healthier food choices and asked about being referred to an outpatient RD. Pt states that she has the  means to be able to afford one and would really like to be connected with one once she is discharged to help her make healthier lifestyle habits. Pt reports that she lives with teenage children and that her ex-husband does not live with her, but is very supportive in her care. Pt reports that since ~2018-2019 she has had very low energy and states that she just does not want to do anything. Pt denies depression. She expresses that she hopes working with an outpatient RD will help her to make better choices and help her to regain her energy/strength. Dietetic intern to refer pt to outpatient RD.  ? ?Provided handout from the Academy of [employer]etic, "Colostomy Nutrition Therapy" for pt and discussed foods recommended/not recommended following colostomy procedure. Discussed tips with pt such as chewing foods really well at each meal to prevent blockages. Pt reports that from her understanding, ostomy bag is short-term.  ? ?Admit weight: 92.7 kg  ?UBW: 190#  ?Pt reports that she used to be 165# in 2017. Pt reports that since her divorce in 2017 and her having low energy, pt has gained weight due to being more sedentary.  ? ?I/O's: +1.5 L since admit ?UOP: 450 mL x 24 hours  ?NG/OG output: 200 mL  ? ?Labs reviewed and include: BUN: 29 (H), Hemoglobin: 8.6 (L)  ? ?Medications reviewed and include:  ? Chlorhexidine Gluconate Cloth  6 each Topical Q0600  ? enoxaparin (LOVENOX) injection  40 mg Subcutaneous Q24H  ? insulin aspart  0-15 Units Subcutaneous Q4H  ? lidocaine  1 patch Transdermal Q24H  ?  mouth rinse  15 mL Mouth Rinse BID  ? sodium chloride flush  10-40 mL Intracatheter Q12H  ? ?Continuous Infusions: ? methocarbamol (ROBAXIN) IV 500 mg (11/07/21 1054)  ? piperacillin-tazobactam (ZOSYN)  IV 3.375 g (11/07/21 1339)  ? ? ?NUTRITION - FOCUSED PHYSICAL EXAM: ? ?Flowsheet Row Most Recent Value  ?Orbital Region No depletion  ?Upper Arm Region No depletion  ?Thoracic and Lumbar Region No depletion  ?Buccal  Region No depletion  ?Temple Region No depletion  ?Clavicle Bone Region No depletion  ?Clavicle and Acromion Bone Region No depletion  ?Scapular Bone Region No depletion  ?Dorsal Hand No depletion  ?Patellar Region No depletion  ?Anterior Thigh Region No depletion  ?Posterior Calf Region No depletion  ?Edema (RD Assessment) None  ?Hair Reviewed  ?Eyes Reviewed  ?Mouth Reviewed  ?Skin Reviewed  ?Nails Reviewed  ? ?  ? ? ?Diet Order:   ?Diet Order   ? ?       ?  Diet NPO time specified Except for: Ice Chips  Diet effective now       ?  ? ?  ?  ? ?  ? ? ?EDUCATION NEEDS:  ? ?Education needs have been addressed ? ?Skin:  Skin Assessment: Reviewed RN Assessment ? ?Last BM:  PTA ? ?Height:  ? ?Ht Readings from Last 1 Encounters:  ?11/05/21 5' (1.524 m)  ? ? ?Weight:  ? ?Wt Readings from Last 1 Encounters:  ?11/05/21 92.7 kg  ? ? ?Ideal Body Weight:  45.4 kg ? ?BMI:  Body mass index is 39.91 kg/m?. ? ?Estimated Nutritional Needs:  ? ?Kcal:  1900 - 2100 ? ?Protein:  95 - 110 grams ? ?Fluid:  >/= 1.9 L ? ? ? ?Maryruth Hancock, Dietetic Intern ?11/07/2021 4:05 PM ?

## 2021-11-07 NOTE — Progress Notes (Signed)
Pt assisted up to [facility] and voided 450 cc clear amber urine after foley DC'd. Assisted pt to bathe some and changed bed. SCDs on and operating. NGT to low intermittent suction draining brown fluid, operating appropriately. Pain score 7 and medicated with dilaudid 1 mg, IV. Will instruct on IS later and she remembered the IS from her previous surgery.  ?

## 2021-11-07 NOTE — TOC Initial Note (Addendum)
Transition of Care (TOC) - Initial/Assessment Note  ? ? ?Patient Details  ?Name: Michele [name] ?MRN: [id number] ?Date of Birth: 1966/07/15 ? ?Transition of Care (TOC) CM/SW Contact:    ?Marilu Favre, RN ?Phone Number: ?11/07/2021, 11:59 AM ? ?Clinical Narrative:                 ?Spoke to patient at bedside. Confirmed face sheet information.  ? ?Patient from home has children part time ages 15 and 55 years old.  ? ?Currently her mother is in town staying with her, but will soon be returning home.  ? ?Her ex husband [name] [phone] assists with arranging things and appointments .  ? ?Discussed home VAC. NCM has submitted paperwork to [employer] , awaiting approval. Once approved home VAC and some supplies for dressing changes at home , will be delivered to hospital room. On day of discharge nurse will connect home VAC to dressing. ? ?[employer] will ship on going dressing supplies to patient's address ? ? ?[place] will enroll patient in ostomy supplies program.  ? ?[place] and bedside nurse will provide ostomy education prior to discharge.  ? ?Home Health nurse will change dressing Monday, Wednesday and Friday.  ? ? ?Patient voiced understanding to all of above. No preference for home health agency.  ? ?NCM spoke to [place] with [facility] they are unable to accept a VAC patient with commercial insurance.  ? ?Hoyle Sauer with [facility] reviewing referral and checking insurance. Await determination.  ? ?[facility] returned call. They have accepted referral. Dawn with [employer] aware . Start of care for [facility] 11/13/21 ? ?Expected Discharge Plan: [place] ?Barriers to Discharge: Continued Medical Work up ? ? ?Patient Goals and CMS Choice ?Patient states their goals for this hospitalization and ongoing recovery are:: to return to home ?CMS Medicare.gov Compare Post Acute Care list provided to:: Patient ?Choice offered to / list presented to : Patient ? ?Expected Discharge Plan and  Services ?Expected Discharge Plan: [place] ?  ?Discharge Planning Services: CM Consult ?Post Acute Care Choice: Home Health ?Living arrangements for the past 2 months: [place] ?                ?  ?  ?  ?  ?  ?HH Arranged: RN ?  ?  ?  ?  ? ?Prior Living Arrangements/Services ?Living arrangements for the past 2 months: [place] ?Lives with:: Self ?Patient language and need for interpreter reviewed:: Yes ?Do you feel safe going back to the place where you live?: Yes      ?Need for Family Participation in Patient Care: Yes (Comment) ?Care giver support system in place?: Yes (comment) ?  ?Criminal Activity/Legal Involvement Pertinent to Current Situation/Hospitalization: No - Comment as needed ? ?Activities of Daily Living ?  ?  ? ?Permission Sought/Granted ?  ?Permission granted to share information with : No ?   ?   ?   ?   ? ?Emotional Assessment ?Appearance:: Appears stated age ?Attitude/Demeanor/Rapport: Engaged ?Affect (typically observed): Accepting ?Orientation: : Oriented to Self, Oriented to Place, Oriented to  Time, Oriented to Situation ?Alcohol / Substance Use: Not Applicable ?Psych Involvement: No (comment) ? ?Admission diagnosis:  Bowel perforation ([place]) [K63.1] ?SOB (shortness of breath) [R06.02] ?Generalized abdominal pain [R10.84] ?Elevated troponin [R77.8] ?Patient Active Problem List  ? Diagnosis Date Noted  ? Bowel perforation ([place]) 11/05/2021  ? Abnormal uterine  and vaginal bleeding, unspecified 10/25/2021  ? 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 Atypical chest pain 06/29/2013  ? Hyperlipidemia 06/15/2013  ? Chest pain 06/15/2013  ? ?PCP:  Sanjuana Kava, MD ?Pharmacy:   ?[id number] [id number]?Phone: [phone]: [phone]? ? ? ? ?Social Determinants of Health (SDOH) Interventions ?  ? ?Readmission Risk Interventions ?   ? View : No data to display.  ?  ?  ?  ? ? ? ?

## 2021-11-08 LAB — BASIC METABOLIC PANEL
Anion gap: 11 (ref 5–15)
BUN: 23 mg/dL — ABNORMAL HIGH (ref 6–20)
CO2: 25 mmol/L (ref 22–32)
Calcium: 7.9 mg/dL — ABNORMAL LOW (ref 8.9–10.3)
Chloride: 103 mmol/L (ref 98–111)
Creatinine, Ser: 0.77 mg/dL (ref 0.44–1.00)
GFR, Estimated: >60 mL/min (ref >60)
Glucose, Bld: 82 mg/dL (ref 70–99)
Potassium: 4.3 mmol/L (ref 3.5–5.1)
Sodium: 139 mmol/L (ref 135–145)

## 2021-11-08 LAB — CBC
HCT: 28 % — ABNORMAL LOW (ref 36.0–46.0)
Hemoglobin: 9.3 g/dL — ABNORMAL LOW (ref 12.0–15.0)
MCH: 26.5 pg (ref 26.0–34.0)
MCHC: 33.2 g/dL (ref 30.0–36.0)
MCV: 79.8 fL — ABNORMAL LOW (ref 80.0–100.0)
Platelets: 382 10*3/uL (ref 150–400)
RBC: 3.51 MIL/uL — ABNORMAL LOW (ref 3.87–5.11)
RDW: 15.2 % (ref 11.5–15.5)
WBC: 20.7 10*3/uL — ABNORMAL HIGH (ref 4.0–10.5)
nRBC: 0.1 % (ref 0.0–0.2)

## 2021-11-08 LAB — GLUCOSE, CAPILLARY
Glucose-Capillary: 78 mg/dL (ref 70–99)
Glucose-Capillary: 86 mg/dL (ref 70–99)
Glucose-Capillary: 82 mg/dL (ref 70–99)
Glucose-Capillary: 93 mg/dL (ref 70–99)
Glucose-Capillary: 167 mg/dL — ABNORMAL HIGH (ref 70–99)

## 2021-11-08 MED ORDER — CITALOPRAM HYDROBROMIDE 20 MG PO TABS
20.0000 mg | ORAL_TABLET | Freq: Every day | ORAL | Status: DC
  Administered 2021-11-08 – 2021-11-10 (×3): 20 mg via ORAL
  Filled 2021-11-08 (×3): qty 1

## 2021-11-08 MED ORDER — FUROSEMIDE 10 MG/ML IJ SOLN
20.0000 mg | Freq: Once | INTRAMUSCULAR | Status: AC
  Administered 2021-11-08: 20 mg via INTRAVENOUS
  Filled 2021-11-08: qty 2

## 2021-11-08 MED ORDER — ACETAMINOPHEN 500 MG PO TABS
1000.0000 mg | ORAL_TABLET | Freq: Four times a day (QID) | ORAL | Status: DC
Start: 2021-11-08 — End: 2021-11-11
  Administered 2021-11-08 – 2021-11-10 (×7): 1000 mg via ORAL
  Filled 2021-11-08 (×9): qty 2

## 2021-11-08 MED ORDER — AMPHETAMINE-DEXTROAMPHET ER 10 MG PO CP24
30.0000 mg | ORAL_CAPSULE | Freq: Every day | ORAL | Status: DC
  Filled 2021-11-08 (×3): qty 3

## 2021-11-08 NOTE — Progress Notes (Signed)
? ? ?3 Days Post-Op  ?Subjective: ?Sleepy, but otherwise doing well.  Had a lot of back pain yesterday from the bed, but seems to be better today. ? ?ROS: See above, otherwise other systems negative ? ?Objective: ?Vital signs in last 24 hours: ?Temp:  [97.8 ?F (36.6 ?C)-98.3 ?F (36.8 ?C)] 98.3 ?F (36.8 ?C) (03/31 0720) ?Pulse Rate:  [86-97] 97 (03/31 0720) ?Resp:  [16-18] 16 (03/31 0720) ?BP: (135-156)/(79-87) 135/87 (03/31 0720) ?SpO2:  [94 %-97 %] 97 % (03/31 0720) ?Weight:  [100.4 kg] 100.4 kg (03/31 0500) ?Last BM Date :  (PTA) ? ?Intake/Output from previous day: ?03/30 0701 - 03/31 0700 ?In: 2204.6 [I.V.:1504.5; IV Piggyback:700.1] ?Out: 65 [Urine:850; Emesis/NG output:690; Drains:75] ?Intake/Output this shift: ?No intake/output data recorded. ? ?PE: ?Gen: NAD ?Abd: soft, obese, appropriately tender, midline wound with VAC in place. L-sided colostomy - stoma retracted but viable. Lots of flatus in colostomy bag today.  No stool yet.  NGT with bilious output, 690cc yesterday ?Ext: edema noted in upper extremities and abdominal wall ? ?Lab Results:  ?Recent Labs  ?  11/07/21 ?0719 11/08/21 ?0130  ?WBC 18.1* 20.7*  ?HGB 8.6* 9.3*  ?HCT 25.5* 28.0*  ?PLT 360 382  ? ?BMET ?Recent Labs  ?  11/07/21 ?0719 11/08/21 ?0130  ?NA 139 139  ?K 4.4 4.3  ?CL 103 103  ?CO2 27 25  ?GLUCOSE 114* 82  ?BUN 29* 23*  ?CREATININE 0.89 0.77  ?CALCIUM 8.1* 7.9*  ? ?CMP  ?   ?Component Value Date/Time  ? NA 139 11/08/2021 0130  ? K 4.3 11/08/2021 0130  ? CL 103 11/08/2021 0130  ? CO2 25 11/08/2021 0130  ? GLUCOSE 82 11/08/2021 0130  ? BUN 23 (H) 11/08/2021 0130  ? CREATININE 0.77 11/08/2021 0130  ? CALCIUM 7.9 (L) 11/08/2021 0130  ? PROT 6.0 (L) 11/05/2021 1255  ? ALBUMIN 1.9 (L) 11/05/2021 1255  ? AST 32 11/05/2021 1255  ? ALT 22 11/05/2021 1255  ? ALKPHOS 138 (H) 11/05/2021 1255  ? BILITOT 0.7 11/05/2021 1255  ? GFRNONAA >60 11/08/2021 0130  ? ?Lipase  ?   ?Component Value Date/Time  ? LIPASE 21 11/05/2021 1255   ? ? ?Studies/Results: ?No results found. ? ?Anti-infectives: ?Anti-infectives (From admission, onward)  ? ? Start     Dose/Rate Route Frequency Ordered Stop  ? 11/05/21 2100  piperacillin-tazobactam (ZOSYN) IVPB 3.375 g       ? 3.375 g ?12.5 mL/hr over 240 Minutes Intravenous Every 8 hours 11/05/21 2010    ? 11/05/21 1500  piperacillin-tazobactam (ZOSYN) IVPB 3.375 g       ? 3.375 g ?100 mL/hr over 30 Minutes Intravenous  Once 11/05/21 1451 11/05/21 1600  ? ?  ? ? ? ?Assessment/Plan ?POD 3, s/p ex lap with Hartmann's procedure for perforated diverticulitis by Dr. Grandville Silos 3/28 ?-clamp NGT and try clears from the floor ?-patient walking in halls, encouraged to continue ?-pulm toilet ?-WOC following for new colostomy and midline wound VAC.  ?-labs in am, WBC slightly up to 20K today ?-multi-modal pain control: scheduled IV Robaxin, oral tylenol, lidoerm patch, PRN HM  ? ? ?FEN - NGT clamped, clears from floor, lasix 20 mg once today ?VTE - Lovenox ?ID - zosyn x 5 days ?Foley - D/C 3/30  ? ?AKI - resolved, 0.77 ?S/p hysterectomy 10/25/21 ?HTN - but don't see any home meds for this.  Prns available ?Restart home meds  - celexa and adderall  ? ?I reviewed Consultant CCM notes, last 41  h vitals and pain scores, last 48 h intake and output, last 24 h labs and trends, and last 24 h imaging results. ? ? LOS: 3 days  ? ? ?Henreitta Cea , PA-C ?[facility] ?11/08/2021, 10:01 AM ?Please see Amion for pager number during day hours 7:00am-4:30pm or 7:00am -11:30am on weekends ? ?

## 2021-11-08 NOTE — Anesthesia Postprocedure Evaluation (Signed)
Anesthesia Post Note ? ?Patient: Michele [name] ? ?Procedure(s) Performed: EXPLORATORY LAPAROTOMY AND COLECTOMY  (Abdomen) ?COLOSTOMY (Left: Abdomen) ? ?  ? ?Patient location during evaluation: PACU ?Anesthesia Type: General ?Level of consciousness: awake ?Respiratory status: spontaneous breathing ?Cardiovascular status: stable ?Postop Assessment: no apparent nausea or vomiting ?Anesthetic complications: no ? ? ?No notable events documented. ? ?Last Vitals:  ?Vitals:  ? 11/08/21 0720 11/08/21 1609  ?BP: 135/87 (!) 143/84  ?Pulse: 97 (!) 106  ?Resp: 16 16  ?Temp: 36.8 ?C 36.9 ?C  ?SpO2: 97% 95%  ?  ?Last Pain:  ?Vitals:  ? 11/08/21 1609  ?TempSrc: Oral  ?PainSc:   ? ? ?  ?  ?  ?  ?  ?  ? ?Zehava Turski ? ? ? ? ?

## 2021-11-08 NOTE — Progress Notes (Signed)
Pt did well with NGT clamped since 1000 this am, no nausea or vomiting and took in sips of clear liquids from the floor. ?

## 2021-11-08 NOTE — Progress Notes (Signed)
Physical Therapy Treatment ?Patient Details ?Name: Michele [name] ?MRN: [id number] ?DOB: Jan 02, 1966 ?Today's Date: 11/08/2021 ? ? ?History of Present Illness 56yo F 10D S/P laparoscopic converted to open hysterectomy, presents to [facility] ED on 3/28 from GYN office for decreased appetite, SOB, redness to abdominal wall. CT abdomen shows significant pneumoperitoneum with possible bowel perforation, s/p ex lap, sigmoid colectomy, and colostomy on 3/28. PMH includes anxiety, anemia, HTN. ? ?  ?PT Comments  ? ? Pt received supine requesting to use BSC upon arrival. Pt requiring up to min assist for bed mobility to perform log roll technique to come to EOB and for LE management back to bed. Pt able to static stand without AD for peri-care/wash for ~14mns with good tolerance and no LOB. Min guard throughout ambulation with use of UV pole to steady. Pt stating she feels a decreased tolerance for activity today, but with good participation and motivation despite. Pt with some continued anxiety toward mobilization and lines/leads but decreasing with increase in movement. Pt continues to benefit from skilled PT services to progress toward functional mobility goals.  ?  ?Recommendations for follow up therapy are one component of a multi-disciplinary discharge planning process, led by the attending physician.  Recommendations may be updated based on patient status, additional functional criteria and insurance authorization. ? ?Follow Up Recommendations ? No PT follow up ?  ?  ?Assistance Recommended at Discharge Intermittent Supervision/Assistance  ?Patient can return home with the following A little help with walking and/or transfers;A little help with bathing/dressing/bathroom;Assist for transportation ?  ?Equipment Recommendations ? None recommended by PT  ?  ?Recommendations for Other Services   ? ? ?  ?Precautions / Restrictions Precautions ?Precautions: Other (comment) ?Precaution Comments: wound vac abdomen, colostomy, on  3LO2 (removed during session, tolerated well), NGT ?Restrictions ?Weight Bearing Restrictions: No  ?  ? ?Mobility ? Bed Mobility ?Overal bed mobility: Needs Assistance ?Bed Mobility: Rolling, Sidelying to Sit, Sit to Sidelying ?Rolling: Min guard ?Sidelying to sit: Min assist ?  ?  ?Sit to sidelying: Min assist ?General bed mobility comments: assist for log rolling technique to L and trunk elevation to come to EOB, assist for bringing LEs back into bed and positioning upon return to supine. ?  ? ?Transfers ?Overall transfer level: Needs assistance ?Equipment used: None ?Transfers: Sit to/from Stand, Bed to chair/wheelchair/BSC ?Sit to Stand: Min assist ?  ?Step pivot transfers: Supervision ?  ?  ?  ?General transfer comment: assist to steady once rising, no physical assist ?  ? ?Ambulation/Gait ?Ambulation/Gait assistance: Supervision ?Gait Distance (Feet): 30 Feet ?Assistive device: IV Pole ?Gait Pattern/deviations: Step-through pattern, Decreased stride length, Trunk flexed ?Gait velocity: decr ?  ?  ?General Gait Details: for safety only, cues to hallway and IV pole navigation ? ? ?Stairs ?  ?  ?  ?  ?  ? ? ?Wheelchair Mobility ?  ? ?Modified Rankin (Stroke Patients Only) ?  ? ? ?  ?Balance   ?  ?  ?  ?  ?  ?  ?  ?  ?  ?  ?  ?  ?  ?  ?  ?  ?  ?  ?  ? ?  ?Cognition Arousal/Alertness: Awake/alert ?Behavior During Therapy: WFL for tasks assessed/performed, Anxious (anxious about lines and initiating mobility, much imporved at end of session and pt looking forward to next session) ?Overall Cognitive Status: Within Functional Limits for tasks assessed ?  ?  ?  ?  ?  ?  ?  ?  ?  ?  ?  ?  ?  ?  ?  ?  ?  ?  ?  ? ?  ?  Exercises   ? ?  ?General Comments General comments (skin integrity, edema, etc.): VSS on RA ?  ?  ? ?Pertinent Vitals/Pain Pain Assessment ?Pain Assessment: Faces ?Faces Pain Scale: Hurts whole lot ?Pain Location: low back, abdomen with deep breathing ?Pain Descriptors / Indicators: Sore, Discomfort ?Pain  Intervention(s): Limited activity within patient's tolerance, Monitored during session, Repositioned, Patient requesting pain meds-RN notified  ? ? ?Home Living   ?  ?  ?  ?  ?  ?  ?  ?  ?  ?   ?  ?Prior Function    ?  ?  ?   ? ?PT Goals (current goals can now be found in the care plan section) Acute Rehab PT Goals ?PT Goal Formulation: With patient ?Time For Goal Achievement: 11/20/21 ? ?  ?Frequency ? ? ? Min 3X/week ? ? ? ?  ?PT Plan    ? ? ?Co-evaluation   ?  ?  ?  ?  ? ?  ?AM-PAC PT "6 Clicks" Mobility   ?Outcome Measure ? Help needed turning from your back to your side while in a flat bed without using bedrails?: A Little ?Help needed moving from lying on your back to sitting on the side of a flat bed without using bedrails?: A Little ?Help needed moving to and from a bed to a chair (including a wheelchair)?: A Little ?Help needed standing up from a chair using your arms (e.g., wheelchair or bedside chair)?: A Little ?Help needed to walk in hospital room?: A Little ?Help needed climbing 3-5 steps with a railing? : A Lot ?6 Click Score: 17 ? ?  ?End of Session   ?Activity Tolerance: Patient tolerated treatment well;Patient limited by fatigue ?Patient left: in bed;with call bell/phone within reach ?Nurse Communication: Mobility status;Patient requests pain meds ?PT Visit Diagnosis: Other abnormalities of gait and mobility (R26.89);Difficulty in walking, not elsewhere classified (R26.2);Pain ?  ? ? ?Time: 9242-6834 ?PT Time Calculation (min) (ACUTE ONLY): 29 min ? ?Charges:  $Gait Training: 8-22 mins ?$Therapeutic Activity: 8-22 mins          ?          ? ?Audry Riles. PTA ?Acute Rehabilitation Services ?Office: [phone] ? ? ? ? ?Betsey Holiday Rody Keadle ?11/08/2021, 12:33 PM ? ?

## 2021-11-08 NOTE — Consult Note (Addendum)
WOC Nurse Consult Note: ?Surgical team following for assessment and plan of care for abd wound. Vac dressing changed to full thickness midline abd post-op wound. Refer to previous consult notes for measurements; wound beefy red, interspersed with yellow adipose tissue. Small amt pink drainage. Pt medicated for pain and tolerated with mod amt discomfort.  Applied one piece black foam to 160m cont suction.  ?  ?[place] ?Pt had colostomy surgery performed 3/28 ?Stoma type/location: Stoma red and viable, flush with skin level, 1 1/2 inches and oval. There is an area of slight mucocutaneous separation located at 3:00 o'clock, approx .2X.2X.1cm, dark red and moist.  Protected with barrier ring to promote healing. No stool or flatus, 30cc bloody liquid in the pouch. Pt participated in pouch change using a hand held mirror.  She was able to apply the barrier ring and open and close the velcro to empty.  Applied barrier ring to attempt to maintain a seal, and one piece flexible convex pouch.  Discussed pouching routines and ordering supplies.  Educational materials left at the bedside, along with 4 barrier rings (Kellie Simmering# [phone] and 4 convex pouches (Kellie Simmering# [phone] Stoma is located in close proximity to the Vac dressing and both will need to be changed Q M/W/F. [place] will perform another pouch change and teaching session with Vac change on Mon.  Pt will need home health assistance related to the Vac dressing after discharge. ?[place] in [place]: Yes ?DJulien GirtMSN, RN, [place] CNS ?[phone]  ?  ?

## 2021-11-09 LAB — GLUCOSE, CAPILLARY
Glucose-Capillary: 102 mg/dL — ABNORMAL HIGH (ref 70–99)
Glucose-Capillary: 104 mg/dL — ABNORMAL HIGH (ref 70–99)
Glucose-Capillary: 109 mg/dL — ABNORMAL HIGH (ref 70–99)
Glucose-Capillary: 111 mg/dL — ABNORMAL HIGH (ref 70–99)
Glucose-Capillary: 116 mg/dL — ABNORMAL HIGH (ref 70–99)
Glucose-Capillary: 163 mg/dL — ABNORMAL HIGH (ref 70–99)
Glucose-Capillary: 93 mg/dL (ref 70–99)

## 2021-11-09 NOTE — Progress Notes (Signed)
4 Days Post-Op  ? ?Subjective/Chief Complaint: ?Pt doing well this AM ?Some back pain ?Abd pain OK ? ? ?Objective: ?Vital signs in last 24 hours: ?Temp:  [97.3 ?F (36.3 ?C)-98.6 ?F (37 ?C)] 97.3 ?F (36.3 ?C) (04/01 2683) ?Pulse Rate:  [91-106] 99 (04/01 0806) ?Resp:  [16-18] 17 (04/01 0806) ?BP: (141-161)/(79-84) 161/81 (04/01 0806) ?SpO2:  [94 %-96 %] 94 % (04/01 0806) ?Last BM Date :  (PTA) ? ?Intake/Output from previous day: ?03/31 0701 - 04/01 0700 ?In: -  ?Out: 30 [Stool:30] ?Intake/Output this shift: ?No intake/output data recorded. ? ?PE: ? ?Constitutional: No acute distress, conversant, appears states age. ?Eyes: Anicteric sclerae, moist conjunctiva, no lid lag ?Lungs: Clear to auscultation bilaterally, normal respiratory effort ?CV: regular rate and rhythm, no murmurs, no peripheral edema, pedal pulses 2+ ?GI: Soft, no masses or hepatosplenomegaly, non-tender to palpation, vac in place, ostomy pink ?Skin: No rashes, palpation reveals normal turgor ?Psychiatric: appropriate judgment and insight, oriented to person, place, and time ? ? ?Lab Results:  ?Recent Labs  ?  11/07/21 ?0719 11/08/21 ?0130  ?WBC 18.1* 20.7*  ?HGB 8.6* 9.3*  ?HCT 25.5* 28.0*  ?PLT 360 382  ? ?BMET ?Recent Labs  ?  11/07/21 ?0719 11/08/21 ?0130  ?NA 139 139  ?K 4.4 4.3  ?CL 103 103  ?CO2 27 25  ?GLUCOSE 114* 82  ?BUN 29* 23*  ?CREATININE 0.89 0.77  ?CALCIUM 8.1* 7.9*  ? ?Anti-infectives: ?Anti-infectives (From admission, onward)  ? ? Start     Dose/Rate Route Frequency Ordered Stop  ? 11/05/21 2100  piperacillin-tazobactam (ZOSYN) IVPB 3.375 g       ? 3.375 g ?12.5 mL/hr over 240 Minutes Intravenous Every 8 hours 11/05/21 2010    ? 11/05/21 1500  piperacillin-tazobactam (ZOSYN) IVPB 3.375 g       ? 3.375 g ?100 mL/hr over 30 Minutes Intravenous  Once 11/05/21 1451 11/05/21 1600  ? ?  ? ? ?Assessment/Plan: ?POD 4, s/p ex lap with Hartmann's procedure for perforated diverticulitis by Dr. Grandville Silos 3/28 ?-con't clamp NGT . Ok for  clears ?-patient walking in halls, encouraged to continue ?-pulm toilet ?-WOC following for new colostomy and midline wound VAC.  ?-labs in am, WBC elev.  Will recheck ?-multi-modal pain control: scheduled IV Robaxin, oral tylenol, lidoerm patch, PRN HM  ?  ?  ?FEN - NGT clamped, clears , lasix 20 mg once today ?VTE - Lovenox ?ID - zosyn x 5 days ?Foley - D/C 3/30  ?  ?AKI - resolved, 0.77 ?S/p hysterectomy 10/25/21 ?HTN - but don't see any home meds for this.  Prns available ?Restart home meds  - celexa and adderall  ?  ?I reviewed Consultant CCM notes, last 24 h vitals and pain scores, last 48 h intake and output, last 24 h labs and trends, and last 24 h imaging results. ? LOS: 4 days  ? ? ?Ralene Ok ?11/09/2021 ? ?

## 2021-11-10 ENCOUNTER — Inpatient Hospital Stay (HOSPITAL_COMMUNITY): Payer: BC Managed Care – PPO

## 2021-11-10 LAB — BASIC METABOLIC PANEL
Anion gap: 9 (ref 5–15)
BUN: 10 mg/dL (ref 6–20)
CO2: 24 mmol/L (ref 22–32)
Calcium: 7.5 mg/dL — ABNORMAL LOW (ref 8.9–10.3)
Chloride: 103 mmol/L (ref 98–111)
Creatinine, Ser: 0.61 mg/dL (ref 0.44–1.00)
GFR, Estimated: >60 mL/min (ref >60)
Glucose, Bld: 110 mg/dL — ABNORMAL HIGH (ref 70–99)
Potassium: 3.4 mmol/L — ABNORMAL LOW (ref 3.5–5.1)
Sodium: 136 mmol/L (ref 135–145)

## 2021-11-10 LAB — GLUCOSE, CAPILLARY
Glucose-Capillary: 100 mg/dL — ABNORMAL HIGH (ref 70–99)
Glucose-Capillary: 107 mg/dL — ABNORMAL HIGH (ref 70–99)
Glucose-Capillary: 128 mg/dL — ABNORMAL HIGH (ref 70–99)
Glucose-Capillary: 103 mg/dL — ABNORMAL HIGH (ref 70–99)
Glucose-Capillary: 124 mg/dL — ABNORMAL HIGH (ref 70–99)

## 2021-11-10 LAB — CBC
HCT: 27.5 % — ABNORMAL LOW (ref 36.0–46.0)
Hemoglobin: 9.1 g/dL — ABNORMAL LOW (ref 12.0–15.0)
MCH: 26 pg (ref 26.0–34.0)
MCHC: 33.1 g/dL (ref 30.0–36.0)
MCV: 78.6 fL — ABNORMAL LOW (ref 80.0–100.0)
Platelets: 540 10*3/uL — ABNORMAL HIGH (ref 150–400)
RBC: 3.5 MIL/uL — ABNORMAL LOW (ref 3.87–5.11)
RDW: 15.1 % (ref 11.5–15.5)
WBC: 23 10*3/uL — ABNORMAL HIGH (ref 4.0–10.5)
nRBC: 0.1 % (ref 0.0–0.2)

## 2021-11-10 MED ORDER — IOHEXOL 9 MG/ML PO SOLN
ORAL | Status: AC
  Administered 2021-11-10: 500 mL
  Filled 2021-11-10: qty 1000

## 2021-11-10 MED ORDER — IOHEXOL 300 MG/ML  SOLN
100.0000 mL | Freq: Once | INTRAMUSCULAR | Status: AC | PRN
  Administered 2021-11-10: 100 mL via INTRAVENOUS

## 2021-11-10 NOTE — Progress Notes (Signed)
Mobility Specialist Progress Note: ? ? 11/10/21 1422  ?Mobility  ?Activity Off unit  ? ?Will follow-up as time allows.  ? ?Ithiel Liebler ?Mobility Specialist ?Primary Phone [phone]' ?

## 2021-11-10 NOTE — Progress Notes (Signed)
5 Days Post-Op  ? ?Subjective/Chief Complaint: ?Pt doing well  ?Tol NGT clamping ?Bowel fxn in ostomy ? ? ?Objective: ?Vital signs in last 24 hours: ?Temp:  [97.5 ?F (36.4 ?C)-98.1 ?F (36.7 ?C)] 98.1 ?F (36.7 ?C) (04/02 0865) ?Pulse Rate:  [92-104] 104 (04/02 0803) ?Resp:  [17-20] 18 (04/02 0803) ?BP: (145-163)/(76-81) 163/81 (04/02 0803) ?SpO2:  [92 %-95 %] 92 % (04/02 0803) ?Last BM Date :  (PTA) ? ?Intake/Output from previous day: ?04/01 0701 - 04/02 0700 ?In: 7846 [P.O.:220; I.V.:2579.1; IV Piggyback:766.9] ?Out: 0  ?Intake/Output this shift: ?No intake/output data recorded. ? ?General appearance: alert and cooperative ?GI: soft, non-tender; bowel sounds normal; no masses,  no organomegaly and ostomy patent ? ?Lab Results:  ?Recent Labs  ?  11/08/21 ?0130 11/10/21 ?0120  ?WBC 20.7* 23.0*  ?HGB 9.3* 9.1*  ?HCT 28.0* 27.5*  ?PLT 382 540*  ? ?BMET ?Recent Labs  ?  11/08/21 ?0130 11/10/21 ?0120  ?NA 139 136  ?K 4.3 3.4*  ?CL 103 103  ?CO2 25 24  ?GLUCOSE 82 110*  ?BUN 23* 10  ?CREATININE 0.77 0.61  ?CALCIUM 7.9* 7.5*  ? ?PT/INR ?No results for input(s): LABPROT, INR in the last 72 hours. ?ABG ?No results for input(s): PHART, HCO3 in the last 72 hours. ? ?Invalid input(s): PCO2, PO2 ? ?Studies/Results: ?No results found. ? ?Anti-infectives: ?Anti-infectives (From admission, onward)  ? ? Start     Dose/Rate Route Frequency Ordered Stop  ? 11/05/21 2100  piperacillin-tazobactam (ZOSYN) IVPB 3.375 g       ? 3.375 g ?12.5 mL/hr over 240 Minutes Intravenous Every 8 hours 11/05/21 2010    ? 11/05/21 1500  piperacillin-tazobactam (ZOSYN) IVPB 3.375 g       ? 3.375 g ?100 mL/hr over 30 Minutes Intravenous  Once 11/05/21 1451 11/05/21 1600  ? ?  ? ? ?Assessment/Plan: ?POD 5, s/p ex lap with Hartmann's procedure for perforated diverticulitis by Dr. Grandville Silos 3/28 ?-DC NGT. clears ?-patient walking in halls, encouraged to continue ?-pulm toilet ?-WOC following for new colostomy and midline wound VAC.  ?-WBC elev.  Will order CT  for likeyl IAA ?-multi-modal pain control: scheduled IV Robaxin, oral tylenol, lidoerm patch, PRN HM  ?  ?  ?FEN - , clears , lasix 20 mg once today ?VTE - Lovenox ?ID - zosyn x 5 days ?Foley - D/C 3/30  ?  ?AKI - resolved, 0.77 ?S/p hysterectomy 10/25/21 ?HTN - but don't see any home meds for this.  Prns available ?Restart home meds  - celexa and adderall  ? LOS: 5 days  ? ? ?Ralene Ok ?11/10/2021 ? ?

## 2021-11-10 NOTE — Progress Notes (Signed)
Mobility Specialist Progress Note: ? ? 11/10/21 1500  ?Mobility  ?Activity Ambulated with assistance in hallway  ?Level of Assistance Standby assist, set-up cues, supervision of patient - no hands on  ?Assistive Device  ?(IV pole)  ?Distance Ambulated (ft) 300 ft  ?Activity Response Tolerated well  ?$Mobility charge 1 Mobility  ? ?Pt received in bed willing to participate in mobility. Complaints of "a little" pain in abdomen. Pt able to stand without assist from elevated bed. Left in chair with call bell in reach and all needs met.  ? ?Michele [name] ?Mobility Specialist ?Primary Phone [phone] ? ?

## 2021-11-10 NOTE — Progress Notes (Signed)
Mobility Specialist Progress Note: ? ? 11/09/21 1400  ?Mobility  ?Activity Refused mobility  ? ? ? ?Karandeep Resende ?Mobility Specialist ?Primary Phone [phone] ? ?

## 2021-11-11 ENCOUNTER — Inpatient Hospital Stay (HOSPITAL_COMMUNITY): Payer: BC Managed Care – PPO

## 2021-11-11 LAB — BASIC METABOLIC PANEL
Anion gap: 10 (ref 5–15)
BUN: 6 mg/dL (ref 6–20)
CO2: 23 mmol/L (ref 22–32)
Calcium: 7.6 mg/dL — ABNORMAL LOW (ref 8.9–10.3)
Chloride: 100 mmol/L (ref 98–111)
Creatinine, Ser: 0.64 mg/dL (ref 0.44–1.00)
GFR, Estimated: >60 mL/min (ref >60)
Glucose, Bld: 103 mg/dL — ABNORMAL HIGH (ref 70–99)
Potassium: 3.7 mmol/L (ref 3.5–5.1)
Sodium: 133 mmol/L — ABNORMAL LOW (ref 135–145)

## 2021-11-11 LAB — AEROBIC/ANAEROBIC CULTURE W GRAM STAIN (SURGICAL/DEEP WOUND)

## 2021-11-11 LAB — CBC
HCT: 29.7 % — ABNORMAL LOW (ref 36.0–46.0)
Hemoglobin: 9.6 g/dL — ABNORMAL LOW (ref 12.0–15.0)
MCH: 25.8 pg — ABNORMAL LOW (ref 26.0–34.0)
MCHC: 32.3 g/dL (ref 30.0–36.0)
MCV: 79.8 fL — ABNORMAL LOW (ref 80.0–100.0)
Platelets: 662 10*3/uL — ABNORMAL HIGH (ref 150–400)
RBC: 3.72 MIL/uL — ABNORMAL LOW (ref 3.87–5.11)
RDW: 15.2 % (ref 11.5–15.5)
WBC: 20.9 10*3/uL — ABNORMAL HIGH (ref 4.0–10.5)
nRBC: 0.1 % (ref 0.0–0.2)

## 2021-11-11 LAB — GLUCOSE, CAPILLARY
Glucose-Capillary: 106 mg/dL — ABNORMAL HIGH (ref 70–99)
Glucose-Capillary: 110 mg/dL — ABNORMAL HIGH (ref 70–99)
Glucose-Capillary: 112 mg/dL — ABNORMAL HIGH (ref 70–99)
Glucose-Capillary: 128 mg/dL — ABNORMAL HIGH (ref 70–99)
Glucose-Capillary: 146 mg/dL — ABNORMAL HIGH (ref 70–99)
Glucose-Capillary: 185 mg/dL — ABNORMAL HIGH (ref 70–99)
Glucose-Capillary: 93 mg/dL (ref 70–99)

## 2021-11-11 MED ORDER — SODIUM CHLORIDE 0.9% FLUSH
5.0000 mL | Freq: Three times a day (TID) | INTRAVENOUS | Status: DC
  Administered 2021-11-12 – 2021-11-29 (×49): 5 mL

## 2021-11-11 MED ORDER — MIDAZOLAM HCL 2 MG/2ML IJ SOLN
INTRAMUSCULAR | Status: AC
  Filled 2021-11-11: qty 2

## 2021-11-11 MED ORDER — CITALOPRAM HYDROBROMIDE 20 MG PO TABS
20.0000 mg | ORAL_TABLET | Freq: Every day | ORAL | Status: DC
Start: 2021-11-11 — End: 2021-11-13
  Administered 2021-11-12: 20 mg
  Filled 2021-11-11: qty 1

## 2021-11-11 MED ORDER — FENTANYL CITRATE (PF) 100 MCG/2ML IJ SOLN
INTRAMUSCULAR | Status: AC | PRN
  Administered 2021-11-11 (×3): 25 ug via INTRAVENOUS

## 2021-11-11 MED ORDER — FENTANYL CITRATE (PF) 100 MCG/2ML IJ SOLN
INTRAMUSCULAR | Status: AC
  Filled 2021-11-11: qty 2

## 2021-11-11 MED ORDER — MIDAZOLAM HCL 2 MG/2ML IJ SOLN
INTRAMUSCULAR | Status: AC | PRN
  Administered 2021-11-11 (×2): .5 mg via INTRAVENOUS
  Administered 2021-11-11: 1 mg via INTRAVENOUS

## 2021-11-11 MED ORDER — ACETAMINOPHEN 500 MG PO TABS
1000.0000 mg | ORAL_TABLET | Freq: Four times a day (QID) | ORAL | Status: DC
  Administered 2021-11-11 – 2021-11-12 (×3): 1000 mg
  Filled 2021-11-11 (×3): qty 2

## 2021-11-11 MED ORDER — ENOXAPARIN SODIUM 60 MG/0.6ML IJ SOSY
0.5000 mg/kg | PREFILLED_SYRINGE | INTRAMUSCULAR | Status: DC
  Administered 2021-11-11 – 2021-11-28 (×18): 50 mg via SUBCUTANEOUS
  Filled 2021-11-11 (×18): qty 0.6

## 2021-11-11 MED ORDER — AMPHETAMINE-DEXTROAMPHET ER 10 MG PO CP24
30.0000 mg | ORAL_CAPSULE | Freq: Every day | ORAL | Status: DC
  Filled 2021-11-11: qty 3

## 2021-11-11 MED ORDER — LIDOCAINE HCL 1 % IJ SOLN
INTRAMUSCULAR | Status: AC
  Filled 2021-11-11: qty 10

## 2021-11-11 NOTE — TOC Progression Note (Addendum)
Transition of Care (TOC) - Progression Note  ? ? ?Patient Details  ?Name: Michele [name] ?MRN: [id number] ?Date of Birth: [birth year] ? ?Transition of Care (TOC) CM/SW Contact  ?Marilu Favre, RN ?Phone Number: ?11/11/2021, 2:26 PM ? ?Clinical Narrative:    ? ?[place] with 60[facility] and Hoyle Sauer with [facility] both given updates . Nurse to provide ostomy and drain care educate  ? ?Expected Discharge Plan: [place] ?Barriers to Discharge: Continued Medical Work up ? ?Expected Discharge Plan and Services ?Expected Discharge Plan: [place] ?  ?Discharge Planning Services: CM Consult ?Post Acute Care Choice: Home Health ?Living arrangements for the past 2 months: [place] ?                ?  ?  ?  ?  ?  ?HH Arranged: RN ?  ?  ?  ?  ? ? ?Social Determinants of Health (SDOH) Interventions ?  ? ?Readmission Risk Interventions ?   ? View : No data to display.  ?  ?  ?  ? ? ?

## 2021-11-11 NOTE — Consult Note (Signed)
? ?Chief Complaint: ?Patient was seen in consultation today for intra-abdominal fluid collection ? ?Referring Physician(s): ?Dr. Ralene Ok ? ?Supervising Physician: Ruthann Cancer ? ?Patient Status: [facility] - In-pt ? ?History of Present Illness: ?Michele [name] is a 56 y.o. female with past medical history of anexity, anemia, s/p recent ex lap for perforated diverticulitis with colectomy and colostomy creation.  She had increase in her WBC with complaint of right-sided abdominal pain.  CT imaging showed multiple intra-abdominal fluid collection, the largest located in the right upper abdomen.  IR consulted for aspiration and drainage.  ? ?Case reviewed by Dr. Serafina Royals who approves patient for right subhepatic collection aspiration and drainage.  ? ?Met with patient and family at bedside who are aware of CT findings and agreeable to procedure.  ?She has been NPO.  ? ?Past Medical History:  ?Diagnosis Date  ? Anemia   ? Anxiety   ? Hypertension   ? with pregnancies  ? ? ?Past Surgical History:  ?Procedure Laterality Date  ? CESAREAN SECTION    ? TWO of them  ? COLOSTOMY Left 11/05/2021  ? Procedure: COLOSTOMY;  Surgeon: Sanjuana Kava, MD;  Location: [place];  Service: Gynecology;  Laterality: Left;  ? CYSTOSCOPY  10/25/2021  ? Procedure: CYSTOSCOPY;  Surgeon: Sanjuana Kava, MD;  Location: [facility] OR;  Service: Gynecology;;  ? dental implants N/A 2016  ? lower 2 teeth  ? HYSTERECTOMY ABDOMINAL WITH SALPINGECTOMY  10/25/2021  ? Procedure: HYSTERECTOMY ABDOMINAL WITH BILATERAL SALPINGECTOMY;  Surgeon: Sanjuana Kava, MD;  Location: [place];  Service: Gynecology;;  ? HYSTEROSCOPY WITH D & C  01/30/2017  ? Procedure: DILATATION AND CURETTAGE /HYSTEROSCOPY;  Surgeon: Sanjuana Kava, MD;  Location: [place] [facility];  Service: Gynecology;;  ? LAPAROSCOPY  10/25/2021  ? Procedure: LAPAROSCOPY DIAGNOSTIC;  Surgeon: Sanjuana Kava, MD;  Location: [place];  Service: Gynecology;;  ? LAPAROTOMY N/A 11/05/2021  ? Procedure: EXPLORATORY LAPAROTOMY AND COLECTOMY ;   Surgeon: Sanjuana Kava, MD;  Location: [place];  Service: Gynecology;  Laterality: N/A;  ? ? ?Allergies: ?Lipitor [atorvastatin] and Sulfa antibiotics ? ?Medications: ?Prior to Admission medications   ?Medication Sig Start Date End Date Taking? Authorizing Provider  ?ADDERALL XR 30 MG 24 hr capsule Take 30 mg by mouth daily. 09/23/21  Yes [provider]  ?citalopram (CELEXA) 20 MG tablet Take 20 mg by mouth daily.   Yes [provider]  ?CRESTOR 5 MG tablet TAKE 1 TABLET BY MOUTH EVERY DAY ?Patient taking differently: Take 5 mg by mouth daily. 04/17/15  Yes Dorothy Spark, MD  ?ibuprofen (ADVIL) 600 MG tablet Take 1 tablet (600 mg total) by mouth every 6 (six) hours as needed for cramping or moderate pain. 10/28/21  Yes Sanjuana Kava, MD  ?oxyCODONE-acetaminophen (PERCOCET/ROXICET) 5-325 MG tablet Take 1-2 tablets by mouth every 4 (four) hours as needed for severe pain or moderate pain. 10/28/21  Yes Sanjuana Kava, MD  ?simethicone (MYLICON) 924 MG chewable tablet Chew 125 mg by mouth every 6 (six) hours as needed for flatulence.   Yes [provider]  ?  ? ?Family History  ?Problem Relation Age of Onset  ? Heart attack Father   ? Hypertension Sister   ? ? ?Social History  ? ?Socioeconomic History  ? Marital status: Legally Separated  ?  Spouse name: Not on file  ? Number of children: Not on file  ? Years of education: Not on file  ? Highest education level: Not on file  ?Occupational History  ?  Not on file  ?Tobacco Use  ? Smoking status: Former  ?  Packs/day: 0.50  ?  Years: 15.00  ?  Pack years: 7.50  ?  Types: Cigarettes  ?  Quit date: 2003  ?  Years since quitting: 20.2  ? Smokeless tobacco: Never  ? Tobacco comments:  ?  quit 2003  ?Vaping Use  ? Vaping Use: Never used  ?Substance and Sexual Activity  ? Alcohol use: No  ? Drug use: No  ? Sexual activity: Yes  ?  Birth control/protection: Surgical  ?  Comment: 2006  ?Other Topics Concern  ? Not on file  ?Social History Narrative  ? The pt is  a homemaker -pt quit smoking cigarettes 2004   ? ?Social Determinants of Health  ? ?Financial Resource Strain: Not on file  ?Food Insecurity: Not on file  ?Transportation Needs: Not on file  ?Physical Activity: Not on file  ?Stress: Not on file  ?Social Connections: Not on file  ? ? ? ?Review of Systems: A 12 point ROS discussed and pertinent positives are indicated in the HPI above.  All other systems are negative. ? ?Review of Systems  ?Constitutional:  Positive for fatigue. Negative for fever.  ?Respiratory:  Negative for cough and shortness of breath.   ?Cardiovascular:  Negative for chest pain.  ?Gastrointestinal:  Positive for abdominal pain and nausea. Negative for vomiting.  ?Musculoskeletal:  Negative for back pain.  ?Psychiatric/Behavioral:  Negative for behavioral problems and confusion.   ? ?Vital Signs: ?BP (!) 154/81 (BP Location: Right Arm)   Pulse 100   Temp 98.4 ?F (36.9 ?C) (Oral)   Resp 18   Ht 5' (1.524 m)   Wt 221 lb 5.5 oz (100.4 kg)   LMP 10/07/2021 (Approximate)   SpO2 94%   BMI 43.23 kg/m?  ? ?Physical Exam ?Vitals and nursing note reviewed.  ?Constitutional:   ?   General: She is not in acute distress. ?   Appearance: She is well-developed. She is ill-appearing.  ?Cardiovascular:  ?   Rate and Rhythm: Normal rate and regular rhythm.  ?Pulmonary:  ?   Effort: Pulmonary effort is normal. No respiratory distress.  ?Skin: ?   General: Skin is warm and dry.  ?Neurological:  ?   General: No focal deficit present.  ?   Mental Status: She is alert and oriented to person, place, and time.  ?Psychiatric:     ?   Mood and Affect: Mood normal.     ?   Behavior: Behavior normal.  ? ? ? ?MD Evaluation ?Airway: WNL ?Heart: WNL ?Abdomen: WNL ?Chest/ Lungs: WNL ?ASA  Classification: 3 ?Mallampati/Airway Score: Two ? ? ?Imaging: ?CT Angio Chest PE W/Cm &/Or Wo Cm ? ?Result Date: 11/05/2021 ?CLINICAL DATA:  Abdominal pain. Possible pulmonary embolus. Status post hysterectomy 10 days ago. EXAM: CT  ANGIOGRAPHY CHEST CT ABDOMEN AND PELVIS WITH CONTRAST TECHNIQUE: Multidetector CT imaging of the chest was performed using the standard protocol during bolus administration of intravenous contrast. Multiplanar CT image reconstructions and MIPs were obtained to evaluate the vascular anatomy. Multidetector CT imaging of the abdomen and pelvis was performed using the standard protocol during bolus administration of intravenous contrast. RADIATION DOSE REDUCTION: This exam was performed according to the departmental dose-optimization program which includes automated exposure control, adjustment of the mA and/or kV according to patient size and/or use of iterative reconstruction technique. CONTRAST:  155m OMNIPAQUE IOHEXOL 350 MG/ML SOLN COMPARISON:  None. FINDINGS: CTA CHEST FINDINGS Cardiovascular: Satisfactory  opacification of the pulmonary arteries to the segmental level. No evidence of pulmonary embolism. Normal heart size. No pericardial effusion. Mediastinum/Nodes: No enlarged mediastinal, hilar, or axillary lymph nodes. Thyroid gland, trachea, and esophagus demonstrate no significant findings. Lungs/Pleura: No pneumothorax or pleural effusion is noted. Mild bibasilar subsegmental atelectasis is noted. Musculoskeletal: No chest wall abnormality. No acute or significant osseous findings. Review of the MIP images confirms the above findings. CT ABDOMEN and PELVIS FINDINGS Hepatobiliary: No focal liver abnormality is seen. No gallstones, gallbladder wall thickening, or biliary dilatation. Pancreas: Unremarkable. No pancreatic ductal dilatation or surrounding inflammatory changes. Spleen: Normal in size without focal abnormality. Adrenals/Urinary Tract: Adrenal glands are unremarkable. Kidneys are normal, without renal calculi, focal lesion, or hydronephrosis. Bladder is unremarkable. Stomach/Bowel: The stomach appears normal. There is no evidence of bowel obstruction or inflammation. However, large pneumoperitoneum  is noted most consistent with rupture of hollow viscus. There is noted fluid and probable stool within the pneumoperitoneum on the right side concerning for colonic perforation. There is also noted extensive subcu

## 2021-11-11 NOTE — Progress Notes (Signed)
Mobility Specialist Progress Note: ? ? 11/11/21 1213  ?Mobility  ?Activity Off unit  ? ?Will follow-up as time allows.  ? ?Michele [name] ?Mobility Specialist ?Primary Phone [phone] ? ?

## 2021-11-11 NOTE — Progress Notes (Addendum)
6 Days Post-Op  ? ?Subjective/Chief Complaint: ?Up in chair. Overall doing well - had a lot of gas and stool follow PO contrast for CT scan yesterday. Denies nausea or  vomiting. Tolerating PO.  ? ? ?Objective: ?Vital signs in last 24 hours: ?Temp:  [97.7 ?F (36.5 ?C)-99.3 ?F (37.4 ?C)] 98.4 ?F (36.9 ?C) (04/03 0804) ?Pulse Rate:  [80-100] 100 (04/03 0804) ?Resp:  [18] 18 (04/03 0804) ?BP: (144-155)/(73-85) 154/81 (04/03 0804) ?SpO2:  [94 %-98 %] 94 % (04/03 0804) ?Last BM Date :  (PTA) ? ?Intake/Output from previous day: ?04/02 0701 - 04/03 0700 ?In: 637.6 [I.V.:471.6; IV Piggyback:166.1] ?Out: 800 [Drains:100; Stool:700] ?Intake/Output this shift: ?Total I/O ?In: -  ?Out: 50 [Stool:50] ? ?General appearance: alert and cooperative ?GI: soft, appropriately tender, wound as below - fascia in tact, no purulence; stoma viable - retracted. Some mucocutaneous separation around 2 o'clock (where stool is visible in the below photo). No cellulitis  ? ? ? ?Lab Results:  ?Recent Labs  ?  11/10/21 ?0120 11/11/21 ?0303  ?WBC 23.0* 20.9*  ?HGB 9.1* 9.6*  ?HCT 27.5* 29.7*  ?PLT 540* 662*  ? ?BMET ?Recent Labs  ?  11/10/21 ?0120 11/11/21 ?0303  ?NA 136 133*  ?K 3.4* 3.7  ?CL 103 100  ?CO2 24 23  ?GLUCOSE 110* 103*  ?BUN 10 6  ?CREATININE 0.61 0.64  ?CALCIUM 7.5* 7.6*  ? ?PT/INR ?No results for input(s): LABPROT, INR in the last 72 hours. ?ABG ?No results for input(s): PHART, HCO3 in the last 72 hours. ? ?Invalid input(s): PCO2, PO2 ? ?Studies/Results: ?CT ABDOMEN PELVIS W CONTRAST ? ?Result Date: 11/10/2021 ?CLINICAL DATA:  Abdominal pain status post exploratory laparotomy and colectomy on 11/05/2021. EXAM: CT ABDOMEN AND PELVIS WITH CONTRAST TECHNIQUE: Multidetector CT imaging of the abdomen and pelvis was performed using the standard protocol following bolus administration of intravenous contrast. RADIATION DOSE REDUCTION: This exam was performed according to the departmental dose-optimization program which includes automated  exposure control, adjustment of the mA and/or kV according to patient size and/or use of iterative reconstruction technique. CONTRAST:  128m OMNIPAQUE IOHEXOL 300 MG/ML  SOLN COMPARISON:  11/05/2021 FINDINGS: Lower chest: There is moderate right pleural effusion with atelectasis of the right lower lobe. Small left pleural effusion is noted with subsegmental atelectasis of the left lower lobe. No airspace consolidation. Hepatobiliary: No focal liver abnormality. Gallbladder appears normal. No bile duct dilatation. Pancreas: Unremarkable. No pancreatic ductal dilatation or surrounding inflammatory changes. Spleen: Normal in size without focal abnormality. Adrenals/Urinary Tract: Normal adrenal glands. No kidney mass, nephrolithiasis or hydronephrosis. Bladder appears normal. Stomach/Bowel: Stomach is nondistended. Postop change from partial left colectomy with left lower quadrant colostomy is identified. Hartmann's pouch is identified. Enteric contrast material is identified up to the level of the proximal descending colon. There is mild edema involving the distal colon extending into the ostomy, image 64/3. The remaining bowel loops are unremarkable. Vascular/Lymphatic: No significant vascular findings are present. No enlarged abdominal or pelvic lymph nodes. Reproductive: Status post hysterectomy.  No adnexal mass. Other: Multiple fluid collections are identified within the abdomen and pelvis of variable maturity. The largest is in the left upper quadrant along the undersurface of the left hemidiaphragm and involving the spleen. This measures 10.5 x 6.3 cm, image 18/3. Fluid collection between the right hemidiaphragm and dome of liver measures 8.5 x 3.8 cm, image 73/6. Along the inferior medial margin of the right lobe of liver there is a fluid collection adjacent to the gallbladder measuring  7.6 x 5.3 cm, image 36/3. Smaller, less mature fluid collections are noted extending along the undersurface of bilateral  ventral abdominal wall. There is also interloop fluid collection within the right lower quadrant of the abdomen. Extensive gas is identified within the subcutaneous soft tissues of the ventral abdominal wall compatible with early postoperative change. Musculoskeletal: No acute or significant osseous findings. IMPRESSION: 1. 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 There is mild edema involving the distal colon extending into the ostomy which may reflect postoperative change. 2. Multiple fluid collections are identified within the abdomen and pelvis of variable maturity. The largest, and most mature fluid collections are in the upper abdomen along the undersurface of the hemidiaphragms and adjacent to the gallbladder. 3. Moderate right pleural effusion with atelectasis of the right lower lobe and small left pleural effusion. 4. These results were called by telephone at the time of interpretation on 11/10/2021 at 3:16 pm to provider [facility] , who verbally acknowledged these results. Electronically Signed   By: Kerby Moors M.D.   On: 11/10/2021 15:16   ? ?Anti-infectives: ?Anti-infectives (From admission, onward)  ? ? Start     Dose/Rate Route Frequency Ordered Stop  ? 11/05/21 2100  piperacillin-tazobactam (ZOSYN) IVPB 3.375 g       ? 3.375 g ?12.5 mL/hr over 240 Minutes Intravenous Every 8 hours 11/05/21 2010    ? 11/05/21 1500  piperacillin-tazobactam (ZOSYN) IVPB 3.375 g       ? 3.375 g ?100 mL/hr over 30 Minutes Intravenous  Once 11/05/21 1451 11/05/21 1600  ? ?  ? ? ?Assessment/Plan: ?POD 6, s/p ex lap with Hartmann's procedure for perforated diverticulitis by Dr. Grandville Silos 3/28 ?- afebrile,WBC 20 from 23 ?- CT A/P yesterday w/ multiple intra-abd fluid collections, two large collections in upper abd. IR consulted for perc drainage. ?- having bowel function via colostomy, advance to FLD after possible procedure ?-patient walking in halls, encouraged to continue ?-pulm  toilet ?-WOC following for new colostomy and midline wound VAC.  ?-multi-modal pain control: scheduled IV Robaxin, oral tylenol, lidoerm patch, PRN HM  ?  ?  ?FEN - NPO for IR consult; FLD ?VTE - Lovenox ?ID - zosyn 3/28 >> continue ?Foley - D/C 3/30  ?  ?AKI - resolved, 0.77 ?S/p hysterectomy 10/25/21 ?HTN - but don't see any home meds for this.  Prns available ?Restart home meds  - celexa and adderall  ? LOS: 6 days  ? ? ?[place] ?11/11/2021 ? ?

## 2021-11-11 NOTE — Progress Notes (Signed)
Physical Therapy Treatment ?Patient Details ?Name: Michele [name] ?MRN: [id number] ?DOB: [birth year] ?Today's Date: 11/11/2021 ? ? ?History of Present Illness 56yo F 10D S/P laparoscopic converted to open hysterectomy, [facility] ED on 3/28 from GYN office for decreased appetite, SOB, redness to abdominal wall. CT abdomen shows significant pneumoperitoneum with possible bowel perforation, s/p ex lap, sigmoid colectomy, and colostomy on 3/28. PMH includes anxiety, anemia, HTN. ? ?  ?PT Comments  ? ? Pt received sitting EOB, s/p new drain placement and agreeable to session. Pt requesting to use BR, requiring min a to steady with HHA on rise. Pt requiring min a throughout ambulation to steady and for lines/leads. Pt with noted instability this session, no LOB but tendency for R/L drift, stating she had stronger pain medication after her procedure and felt a little off. Pt seated up in recliner at end of session.  Pt continues to benefit from skilled PT services to progress toward functional mobility goals.  ?  ?Recommendations for follow up therapy are one component of a multi-disciplinary discharge planning process, led by the attending physician.  Recommendations may be updated based on patient status, additional functional criteria and insurance authorization. ? ?Follow Up Recommendations ? No PT follow up ?  ?  ?Assistance Recommended at Discharge Intermittent Supervision/Assistance  ?Patient can return home with the following A little help with walking and/or transfers;A little help with bathing/dressing/bathroom;Assist for transportation ?  ?Equipment Recommendations ? None recommended by PT  ?  ?Recommendations for Other Services   ? ? ?  ?Precautions / Restrictions Precautions ?Precautions: Other (comment) ?Precaution Comments: wound vac abdomen, colostomy, on 3LO2 (removed during session, tolerated well), NGT ?Restrictions ?Weight Bearing Restrictions: No  ?  ? ?Mobility ? Bed Mobility ?  ?  ?  ?  ?  ?  ?   ?General bed mobility comments: pt seated EOB on arrival ?  ? ?Transfers ?Overall transfer level: Needs assistance ?Equipment used: 1 person hand held assist ?Transfers: Sit to/from Stand, Bed to chair/wheelchair/BSC ?Sit to Stand: Min guard, Min assist ?  ?  ?  ?  ?  ?General transfer comment: assist to steady on rise, no physical assist ?  ? ?Ambulation/Gait ?Ambulation/Gait assistance: Min assist ?Gait Distance (Feet): 30 Feet ?Assistive device: 1 person hand held assist ?Gait Pattern/deviations: Step-through pattern, Decreased stride length, Trunk flexed ?Gait velocity: decr ?  ?  ?General Gait Details: HHA assist to steady as pt with more instability s/p new drain placement ? ? ?Stairs ?  ?  ?  ?  ?  ? ? ?Wheelchair Mobility ?  ? ?Modified Rankin (Stroke Patients Only) ?  ? ? ?  ?Balance   ?  ?  ?  ?  ?  ?  ?  ?  ?  ?  ?  ?  ?  ?  ?  ?  ?  ?  ?  ? ?  ?Cognition Arousal/Alertness: Lethargic, Suspect due to medications (pt premedicated and stating she felt "off") ?Behavior During Therapy: WFL for tasks assessed/performed, Anxious (anxious about lines and initiating mobility, much imporved at end of session and pt looking forward to next session) ?Overall Cognitive Status: Within Functional Limits for tasks assessed ?Area of Impairment: Safety/judgement ?  ?  ?  ?  ?  ?  ?  ?  ?  ?  ?  ?  ?  ?  ?  ?General Comments: more noted instability this session, pt pre-medicated s/p drain placement. ?  ?  ? ?  ?  Exercises   ? ?  ?General Comments   ?  ?  ? ?Pertinent Vitals/Pain Pain Assessment ?Pain Assessment: Faces ?Faces Pain Scale: Hurts little more ?Pain Location: low back, abdomen with deep breathing ?Pain Descriptors / Indicators: Sore, Discomfort  ? ? ?Home Living   ?  ?  ?  ?  ?  ?  ?  ?  ?  ?   ?  ?Prior Function    ?  ?  ?   ? ?PT Goals (current goals can now be found in the care plan section) Acute Rehab PT Goals ?PT Goal Formulation: With patient ?Time For Goal Achievement: 11/20/21 ? ?  ?Frequency ? ? ? Min  3X/week ? ? ? ?  ?PT Plan    ? ? ?Co-evaluation   ?  ?  ?  ?  ? ?  ?AM-PAC PT "6 Clicks" Mobility   ?Outcome Measure ? Help needed turning from your back to your side while in a flat bed without using bedrails?: A Little ?Help needed moving from lying on your back to sitting on the side of a flat bed without using bedrails?: A Little ?Help needed moving to and from a bed to a chair (including a wheelchair)?: A Little ?Help needed standing up from a chair using your arms (e.g., wheelchair or bedside chair)?: A Little ?Help needed to walk in hospital room?: A Little ?Help needed climbing 3-5 steps with a railing? : A Lot ?6 Click Score: 17 ? ?  ?End of Session   ?Activity Tolerance: Patient limited by fatigue;Patient limited by lethargy ?Patient left: with call bell/phone within reach;in chair;with family/visitor present ?Nurse Communication: Mobility status ?PT Visit Diagnosis: Other abnormalities of gait and mobility (R26.89);Difficulty in walking, not elsewhere classified (R26.2);Pain ?  ? ? ?Time: 6295-2841 ?PT Time Calculation (min) (ACUTE ONLY): 13 min ? ?Charges:  $Therapeutic Activity: 8-22 mins          ?          ? ?Audry Riles. PTA ?Acute Rehabilitation Services ?Office: [phone] ? ? ? ?Betsey Holiday Kerrie Timm ?11/11/2021, 4:10 PM ? ?

## 2021-11-11 NOTE — Consult Note (Addendum)
WOC Nurse Consult Note: ?Surgical PA at the bedside to assess wound and stoma appearance. Vac dressing changed to full thickness midline abd post-op wound. Refer to previous consult notes for measurements; wound beefy red, interspersed with yellow adipose tissue. Small amt pink drainage. Pt medicated for pain and tolerated with mod amt discomfort. Applied one piece black foam to 136m cont suction.  ?  ?[place] ?Pt had colostomy surgery performed 3/28 ?Stoma type/location: Stoma red and viable, flush with skin level, 1 1/2 inches and oval. There is an area of slight mucocutaneous separation located at 3:00 o'clock, approx .3X.3X.1cm, dark red and moist.  Protected with barrier ring to promote healing. 50 cc brown liquid stool in the pouch. Pt participated in pouch change using a hand held mirror.  She was able to apply the barrier ring and open and close the velcro to empty.  Applied barrier ring to attempt to maintain a seal, and one piece flexible convex pouch. Discussed pouching routines and ordering supplies.  Educational materials left at the bedside, along with 3 barrier rings (Kellie Simmering# [phone] and 3 convex pouches (Kellie Simmering# [phone] Stoma is located in close proximity to the Vac dressing and both will need to be changed Q M/W/F. WOC team will perform another pouch change and teaching session with Vac change on Wed.  Pt will need home health assistance related to the Vac dressing after discharge. ?[place] in [place]: Yes, previously ?DJulien GirtMSN, RN, CS[place] CNS ?[phone]  ?  ?

## 2021-11-11 NOTE — Procedures (Signed)
Interventional Radiology Procedure Note ? ?Procedure: CT guided abdominal drain placement ? ?Findings: Please refer to procedural dictation for full description. RUQ subhepatic fluid collection yielding approximately 80 mL opaque, yellow fluid.  10.2 Fr pigtail drain placed to bulb suction.  Sample sent for culture. ? ?Complications: None immediate ? ?Estimated Blood Loss: < 5 mL ? ?Recommendations: ?Keep to bulb suction for now. ?Follow up cultures. ?IR will follow. ? ? ?Ruthann Cancer, MD ?Pager: [phone] ? ? ? ?

## 2021-11-12 ENCOUNTER — Other Ambulatory Visit (HOSPITAL_COMMUNITY): Payer: Self-pay

## 2021-11-12 ENCOUNTER — Other Ambulatory Visit: Payer: Self-pay | Admitting: Student

## 2021-11-12 LAB — BASIC METABOLIC PANEL
Anion gap: 9 (ref 5–15)
Anion gap: 8 (ref 5–15)
BUN: <5 mg/dL — ABNORMAL LOW (ref 6–20)
BUN: 7 mg/dL (ref 6–20)
CO2: 25 mmol/L (ref 22–32)
CO2: 24 mmol/L (ref 22–32)
Calcium: 7.5 mg/dL — ABNORMAL LOW (ref 8.9–10.3)
Calcium: 7.5 mg/dL — ABNORMAL LOW (ref 8.9–10.3)
Chloride: 101 mmol/L (ref 98–111)
Chloride: 100 mmol/L (ref 98–111)
Creatinine, Ser: 0.56 mg/dL (ref 0.44–1.00)
Creatinine, Ser: 0.71 mg/dL (ref 0.44–1.00)
GFR, Estimated: >60 mL/min (ref >60)
GFR, Estimated: >60 mL/min (ref >60)
Glucose, Bld: 124 mg/dL — ABNORMAL HIGH (ref 70–99)
Glucose, Bld: 222 mg/dL — ABNORMAL HIGH (ref 70–99)
Potassium: 2.8 mmol/L — ABNORMAL LOW (ref 3.5–5.1)
Potassium: 3.4 mmol/L — ABNORMAL LOW (ref 3.5–5.1)
Sodium: 135 mmol/L (ref 135–145)
Sodium: 132 mmol/L — ABNORMAL LOW (ref 135–145)

## 2021-11-12 LAB — CBC
HCT: 28.9 % — ABNORMAL LOW (ref 36.0–46.0)
Hemoglobin: 9.6 g/dL — ABNORMAL LOW (ref 12.0–15.0)
MCH: 26.4 pg (ref 26.0–34.0)
MCHC: 33.2 g/dL (ref 30.0–36.0)
MCV: 79.4 fL — ABNORMAL LOW (ref 80.0–100.0)
Platelets: 632 10*3/uL — ABNORMAL HIGH (ref 150–400)
RBC: 3.64 MIL/uL — ABNORMAL LOW (ref 3.87–5.11)
RDW: 15.4 % (ref 11.5–15.5)
WBC: 16.4 10*3/uL — ABNORMAL HIGH (ref 4.0–10.5)
nRBC: 0.2 % (ref 0.0–0.2)

## 2021-11-12 LAB — MAGNESIUM: Magnesium: 1.8 mg/dL (ref 1.7–2.4)

## 2021-11-12 LAB — GLUCOSE, CAPILLARY
Glucose-Capillary: 107 mg/dL — ABNORMAL HIGH (ref 70–99)
Glucose-Capillary: 119 mg/dL — ABNORMAL HIGH (ref 70–99)
Glucose-Capillary: 163 mg/dL — ABNORMAL HIGH (ref 70–99)
Glucose-Capillary: 168 mg/dL — ABNORMAL HIGH (ref 70–99)
Glucose-Capillary: 196 mg/dL — ABNORMAL HIGH (ref 70–99)
Glucose-Capillary: 222 mg/dL — ABNORMAL HIGH (ref 70–99)
Glucose-Capillary: 69 mg/dL — ABNORMAL LOW (ref 70–99)

## 2021-11-12 MED ORDER — POTASSIUM CHLORIDE 20 MEQ PO PACK
20.0000 meq | PACK | Freq: Once | ORAL | Status: AC
  Administered 2021-11-12: 20 meq via ORAL
  Filled 2021-11-12: qty 1

## 2021-11-12 MED ORDER — POTASSIUM CHLORIDE 10 MEQ/100ML IV SOLN: 10.0000 meq | INTRAVENOUS | Status: DC

## 2021-11-12 MED ORDER — POTASSIUM CHLORIDE 20 MEQ PO PACK
40.0000 meq | PACK | Freq: Two times a day (BID) | ORAL | Status: DC
  Administered 2021-11-12 (×2): 40 meq via ORAL
  Filled 2021-11-12 (×2): qty 2

## 2021-11-12 MED ORDER — INSULIN ASPART 100 UNIT/ML IJ SOLN
0.0000 [IU] | INTRAMUSCULAR | Status: DC
  Administered 2021-11-12 – 2021-11-13 (×2): 3 [IU] via SUBCUTANEOUS
  Administered 2021-11-13: 2 [IU] via SUBCUTANEOUS
  Administered 2021-11-13: 5 [IU] via SUBCUTANEOUS
  Administered 2021-11-13: 2 [IU] via SUBCUTANEOUS
  Administered 2021-11-14: 3 [IU] via SUBCUTANEOUS
  Administered 2021-11-14: 2 [IU] via SUBCUTANEOUS
  Administered 2021-11-14 (×2): 3 [IU] via SUBCUTANEOUS
  Administered 2021-11-14: 2 [IU] via SUBCUTANEOUS
  Administered 2021-11-15: 5 [IU] via SUBCUTANEOUS
  Administered 2021-11-15 (×3): 2 [IU] via SUBCUTANEOUS
  Administered 2021-11-15 – 2021-11-16 (×2): 3 [IU] via SUBCUTANEOUS
  Administered 2021-11-16: 2 [IU] via SUBCUTANEOUS
  Administered 2021-11-16: 3 [IU] via SUBCUTANEOUS
  Administered 2021-11-16: 2 [IU] via SUBCUTANEOUS
  Administered 2021-11-17 (×3): 3 [IU] via SUBCUTANEOUS
  Administered 2021-11-17: 2 [IU] via SUBCUTANEOUS
  Administered 2021-11-17 – 2021-11-18 (×3): 3 [IU] via SUBCUTANEOUS
  Administered 2021-11-19: 5 [IU] via SUBCUTANEOUS
  Administered 2021-11-19: 2 [IU] via SUBCUTANEOUS
  Administered 2021-11-19 (×2): 3 [IU] via SUBCUTANEOUS
  Administered 2021-11-19: 2 [IU] via SUBCUTANEOUS
  Administered 2021-11-20: 3 [IU] via SUBCUTANEOUS
  Administered 2021-11-21 (×2): 2 [IU] via SUBCUTANEOUS
  Administered 2021-11-21: 5 [IU] via SUBCUTANEOUS
  Administered 2021-11-21 – 2021-11-22 (×2): 2 [IU] via SUBCUTANEOUS

## 2021-11-12 MED ORDER — POTASSIUM CHLORIDE 20 MEQ PO PACK: 40.0000 meq | PACK | Freq: Two times a day (BID) | ORAL | Status: DC

## 2021-11-12 MED ORDER — ENSURE ENLIVE PO LIQD
237.0000 mL | Freq: Three times a day (TID) | ORAL | Status: DC
  Administered 2021-11-12 – 2021-11-29 (×30): 237 mL via ORAL

## 2021-11-12 MED ORDER — POTASSIUM CHLORIDE 10 MEQ/100ML IV SOLN
10.0000 meq | INTRAVENOUS | Status: AC
  Administered 2021-11-12 (×3): 10 meq via INTRAVENOUS
  Filled 2021-11-12 (×3): qty 100

## 2021-11-12 MED ORDER — LIDOCAINE 5 % EX PTCH: 1.0000 | MEDICATED_PATCH | CUTANEOUS | Status: DC

## 2021-11-12 MED ORDER — METHOCARBAMOL 500 MG PO TABS
1000.0000 mg | ORAL_TABLET | Freq: Four times a day (QID) | ORAL | Status: DC
  Administered 2021-11-12 (×2): 1000 mg via ORAL
  Filled 2021-11-12 (×3): qty 2

## 2021-11-12 MED ORDER — MAGNESIUM OXIDE -MG SUPPLEMENT 400 (240 MG) MG PO TABS
800.0000 mg | ORAL_TABLET | Freq: Once | ORAL | Status: AC
  Administered 2021-11-12: 800 mg via ORAL
  Filled 2021-11-12: qty 2

## 2021-11-12 MED ORDER — POTASSIUM CHLORIDE CRYS ER 20 MEQ PO TBCR: 40.0000 meq | EXTENDED_RELEASE_TABLET | Freq: Once | ORAL | Status: DC

## 2021-11-12 MED ORDER — ACETAMINOPHEN 500 MG PO TABS
1000.0000 mg | ORAL_TABLET | Freq: Four times a day (QID) | ORAL | Status: DC
  Administered 2021-11-12 – 2021-11-29 (×59): 1000 mg via ORAL
  Filled 2021-11-12 (×63): qty 2

## 2021-11-12 MED ORDER — SODIUM CHLORIDE 0.9% FLUSH
5.0000 mL | Freq: Every day | INTRAVENOUS | 1 refills | Status: DC
  Filled 2021-11-12: qty 70, 14d supply, fill #0

## 2021-11-12 MED ORDER — OXYCODONE HCL 5 MG PO TABS
5.0000 mg | ORAL_TABLET | ORAL | Status: DC | PRN
  Administered 2021-11-12 – 2021-11-17 (×21): 10 mg via ORAL
  Administered 2021-11-18: 5 mg via ORAL
  Administered 2021-11-18 – 2021-11-19 (×8): 10 mg via ORAL
  Filled 2021-11-12 (×10): qty 2
  Filled 2021-11-12: qty 1
  Filled 2021-11-12 (×22): qty 2

## 2021-11-12 NOTE — Discharge Instructions (Addendum)
WOUND CARE: ?- Change dressing twice daily ?- Supplies: kerlex, scissors, ABD pads, tape  ?Remove dressing and all packing carefully. ?2.   Clean edges of skin around the wound with water/gauze, making sure there is no tape debris or leakage left on skin that could cause skin irritation or breakdown. ?3.   Pack wound with dry kerlex over retention sutures. Wound can be packed loosely. Trim kerlex to size if a whole kerlex is not required. ?4.   Cover wound with a dry ABD pad and secure with tape.  ?5.   Write the date/time on the dry dressing/tape to better track when the last dressing change occurred. ?- apply any skin protectant/powder if recommended by clinician to protect skin/skin folds. ?- change dressing as needed if leakage occurs, wound gets contaminated, or patient requests to shower. ?- You may shower daily with wound open and following the shower the wound should be dried and a clean dressing placed.  ?- Medical grade tape as well as packing supplies can be found at [employer] on [address] or [employer] on [address]. The remaining supplies can be found at your local drug store, [place] etc. ? ? ? ?[facility], [place] ?[phone] ? ?OPEN ABDOMINAL SURGERY: POST OP INSTRUCTIONS ? ?Always review your discharge instruction sheet given to you by the facility where your surgery was performed. ? ?IF YOU HAVE DISABILITY OR FAMILY LEAVE FORMS, YOU MUST BRING THEM TO THE OFFICE FOR PROCESSING.  PLEASE DO NOT GIVE THEM TO YOUR DOCTOR. ? ?A prescription for pain medication may be given to you upon discharge.  Take your pain medication as prescribed, if needed.  If narcotic pain medicine is not needed, then you may take acetaminophen (Tylenol) or ibuprofen (Advil) as needed. ?Take your usually prescribed medications unless otherwise directed. ?If you need a refill on your pain medication, please contact your pharmacy. They will contact our office to request  authorization.  Prescriptions will not be filled after 5pm or on week-ends. ?You should follow a light diet the first few days after arrival home, such as soup and crackers, pudding, etc.unless your doctor has advised otherwise. A high-fiber, low fat diet can be resumed as tolerated.   Be sure to include lots of fluids daily. Most patients will experience some swelling and bruising on the chest and neck area.  Ice packs will help.  Swelling and bruising can take several days to resolve ?Most patients will experience some swelling and bruising in the area of the incision. Ice pack will help. Swelling and bruising can take several days to resolve.[name]  ?It is common to experience some constipation if taking pain medication after surgery.  Increasing fluid intake and taking a stool softener will usually help or prevent this problem from occurring.  A mild laxative (Milk of Magnesia or Miralax) should be taken according to package directions if there are no bowel movements after 48 hours. ? ACTIVITIES:  You may resume regular (light) daily activities beginning the next day--such as daily self-care, walking, climbing stairs--gradually increasing activities as tolerated.  You may have sexual intercourse when it is comfortable.  Refrain from any heavy lifting or straining until approved by your doctor. ?You may drive when you no longer are taking prescription pain medication, you can comfortably wear a seatbelt, and you can safely maneuver your car and apply brakes ?You should see your doctor in the office for a follow-up appointment approximately two weeks after  your surgery.  Make sure that you call for this appointment within a day or two after you arrive home to insure a convenient appointment time. ? ? ?WHEN TO CALL YOUR DOCTOR: ?Fever over 101.0 ?Inability to urinate ?Nausea and/or vomiting ?Extreme swelling or bruising ?Continued bleeding from incision. ?Increased pain, redness, or drainage from the  incision. ?Difficulty swallowing or breathing ?Muscle cramping or spasms. ?Numbness or tingling in hands or feet or around lips. ? ?The clinic staff is available to answer your questions during regular business hours.  Please don?t hesitate to call and ask to speak to one of the nurses if you have concerns. ? ?For further questions, please visit [email or web address] ? ? ? ?__________________________________________________________________________________________ ? ? ?Colostomy Nutrition Therapy  ? ?This handout will provide some basic information about the effects of the colostomy surgery on digestion, absorption, and dietary changes for better results.  ?Colostomy surgery is when part of the large intestine (colon) is removed or bypassed. The remaining part of the functioning colon is brought through the abdominal wall, creating a stoma. It can be temporary or permanent depending on the surgery. ?After surgery, your bowel is swollen and you should avoid high-fiber foods. This is because they are harder to digest, and avoiding them will allow the bowel to heal and to avoid blockage of the colostomy. The eating plan begins with clear liquids and then should be advanced to a fiber-restricted diet before leaving the hospital. If you have lactose intolerance, then use lactose-free milk products. Chew your food slowly and well to help reduce the risk of blockage of the ostomy. ?As you recover, you will start eating solid foods, beginning with foods that are low in fiber (see Recommended Foods). The fiber-restricted diet should contain less than 13 grams of fiber for the whole day and may be less than 8 grams fiber per day depending on your symptoms. ?Most patients begin to eat more normally 6 weeks after surgery.  ? ?Tips ?Take small bites of foods and chew thoroughly for better digestion and absorption of nutrients. ?Some foods may cause blockages, especially if eaten in large amounts or not chewed well. Use  caution when eating these foods, eat small amounts only, and chew them thoroughly, which will also help with better absorption of nutrients. ?You may find that your appetite is not as good as before surgery, so eating small amounts about every 2-4 hours is recommended. Keeping a regular schedule for meals and snacks can help reduce gas and result in better absorption of the nutrients from the foods. ?Eat meals and snacks at the same times each day. ?Eating the largest meal in the middle of the day may decrease stool output at night, making it easier for you to get a full night?s rest. ?Avoid acidic, spicy, fried or greasy foods, as well as foods that are high in sugar (candy, cake, pies, cookies, and sugary drinks). All of these foods can cause diarrhea. ?Foods that can thicken stools include bananas, applesauce, rice, and pasta. ?Some foods may cause odors or increase gas production (see Foods Not Recommended list). ?To reduce gas, avoid chewing gum, drinking with straws, carbonated beverages, smoking or chewing tobacco, eating too fast, and skipping meals. Missing meals can cause the small intestine to be more active and increase gas and watery stools. There is a ?lag time?--the time from eating a gas-producing food and actual release of gas is 6-8 hours for distal colostomy patients. ?Limit foods that may cause gas or odor and choose  foods that may decrease odor. ?Have at least 8 to 10 cups of fluids per day. You will need to drink more during hot weather, when you have increased stools, and at other times when you lose extra fluid, such as when you exercise. Fluid equivalents are: 1 ounce is 30 milliliters; 1 cup is 8 ounces; 8 cups is 64 ounces, or 2 quarts, or 2 liters. ?It is important to watch for signs and symptoms of fluid-electrolyte imbalance, which include dry mouth, reduced urine output, dark concentrated urine, feelings of dizziness upon standing, marked fatigue, and abdominal cramping. If these  occur, seek prompt treatment. Ileostomy patients are more at risk for dehydration. Remember, high-potassium foods are needed more to offset the effects of diarrhea. ?Add foods containing fiber gradually to your diet, but avoid tho

## 2021-11-12 NOTE — Progress Notes (Signed)
Inpatient Diabetes Program Recommendations ? ?AACE/ADA: New Consensus Statement on Inpatient Glycemic Control (2015) ? ?Target Ranges:  Prepandial:   less than 140 mg/dL ?     Peak postprandial:   less than 180 mg/dL (1-2 hours) ?     Critically ill patients:  140 - 180 mg/dL  ? ?Lab Results  ?Component Value Date  ? GLUCAP 163 (H) 11/12/2021  ? HGBA1C 6.6 (H) 11/06/2021  ? ? ?Review of Glycemic Control ? Latest Reference Range & Units 11/11/21 20:27 11/11/21 23:40 11/12/21 03:25 11/12/21 04:53 11/12/21 07:40 11/12/21 11:38  ?Glucose-Capillary 70 - 99 mg/dL 128 (H) ?Novolog 2 units 146 (H) ?Novolog 2 units 69 (L) 119 (H) 107 (H) 163 (H)  ?(H): Data is abnormally high ?(L): Data is abnormally low ? ?Diabetes history: No DM hx noted ?Current orders for Inpatient glycemic control: Novolog correction 0-15 units q 4 hrs. ? ?Inpatient Diabetes Program Recommendations:   ?Noted patient has A1c 6.6 which indicates patient may be new onset DM2. ?-Consider decrease in Novolog correction to 0-9 units tid, hs 0-5 units. CBG 69 post correction during the hs ? ?Thank you, ?Nani Gasser Jazzmon Prindle, RN, MSN, CDE  ?Diabetes Coordinator ?Inpatient Glycemic Control Team ?Team Pager [phone] (8am-5pm) ?11/12/2021 1:09 PM ? ? ? ? ?

## 2021-11-12 NOTE — Progress Notes (Signed)
Mobility Specialist Progress Note: ? ? 11/12/21 1250  ?Mobility  ?Activity Contraindicated/medical hold  ? ?Pt stating she had pain medicine and said "it feels like i'm floating on a cloud". Pt would like to hold therapy til she feels better. Will follow-up as time allows. ? ?Alyannah Sanks ?Mobility Specialist ?Primary Phone [phone] ? ?

## 2021-11-12 NOTE — Progress Notes (Signed)
Mobility Specialist Progress Note: ? ? 11/12/21 1558  ?Mobility  ?Activity Refused mobility  ? ?Pt resting in the chair not wanting to ambulate right now. Will follow-up as time allows.  ? ?Nancy Arvin ?Mobility Specialist ?Primary Phone [phone] ? ?

## 2021-11-12 NOTE — Progress Notes (Signed)
? ? ?Referring Physician(s): Ralene Ok  ? ?Supervising Physician: Michaelle Birks ? ?Patient Status:  [facility] - In-pt ? ?Chief Complaint: ? ?S/p ex lap for perforated diverticulitis with colectomy and colostomy creation on 7/25, recovery complicated by intra-abdominal fluid collection development, s/p RUQ drain placement y Dr. Serafina Royals on 11/11/21.  ? ?Subjective: ? ?Patient sitting in a chair, eating breakfast. Visitor at the bedside.  ?States she is still sore on her lower abdomen, reports she feels hot but thinks it is related to IV potassium. States that she wished if she can get her muscle relaxer - RN notified.  ?Denies N/V.  ?No expected d/c date yet per patient.  ?Discussed drain care and follow up.  ?Outpatient prescription for saline flush sent to [facility].  ? ? ?Allergies: ?Lipitor [atorvastatin] and Sulfa antibiotics ? ?Medications: ?Prior to Admission medications   ?Medication Sig Start Date End Date Taking? Authorizing Provider  ?ADDERALL XR 30 MG 24 hr capsule Take 30 mg by mouth daily. 09/23/21  Yes [provider]  ?citalopram (CELEXA) 20 MG tablet Take 20 mg by mouth daily.   Yes [provider]  ?CRESTOR 5 MG tablet TAKE 1 TABLET BY MOUTH EVERY DAY ?Patient taking differently: Take 5 mg by mouth daily. 04/17/15  Yes Dorothy Spark, MD  ?ibuprofen (ADVIL) 600 MG tablet Take 1 tablet (600 mg total) by mouth every 6 (six) hours as needed for cramping or moderate pain. 10/28/21  Yes Sanjuana Kava, MD  ?oxyCODONE-acetaminophen (PERCOCET/ROXICET) 5-325 MG tablet Take 1-2 tablets by mouth every 4 (four) hours as needed for severe pain or moderate pain. 10/28/21  Yes Sanjuana Kava, MD  ?simethicone (MYLICON) 366 MG chewable tablet Chew 125 mg by mouth every 6 (six) hours as needed for flatulence.   Yes [provider]  ? ? ? ?Vital Signs: ?BP (!) 142/79 (BP Location: Left Arm)   Pulse 90   Temp 99.1 ?F (37.3 ?C) (Oral)   Resp 17   Ht 5' (1.524 m)   Wt 221 lb 5.5 oz (100.4  kg)   LMP 10/07/2021 (Approximate)   SpO2 98%   BMI 43.23 kg/m?  ? ?Physical Exam ?Vitals reviewed.  ?Constitutional:   ?   General: She is not in acute distress. ?   Appearance: She is well-developed. She is not ill-appearing.  ?HENT:  ?   Head: Normocephalic and atraumatic.  ?Pulmonary:  ?   Effort: Pulmonary effort is normal.  ?Abdominal:  ?   Palpations: Abdomen is soft.  ?Skin: ?   General: Skin is warm and dry.  ?   Coloration: Skin is not cyanotic or pale.  ?   Comments: Positive RUQ drain to a suction bulb. Site is unremarkable with no erythema, edema, tenderness, bleeding or drainage. Suture and stat lock in place. Dressing is clean, dry, and intact. 5 ml of  clear yellow colored fluid noted in the bulb. Drain flushes well, does not aspirate much.  ?  ?Neurological:  ?   Mental Status: She is alert and oriented to person, place, and time.  ?Psychiatric:     ?   Mood and Affect: Mood normal.     ?   Behavior: Behavior normal.  ? ? ?Imaging: ?CT ABDOMEN PELVIS W CONTRAST ? ?Result Date: 11/10/2021 ?CLINICAL DATA:  Abdominal pain status post exploratory laparotomy and colectomy on 11/05/2021. EXAM: CT ABDOMEN AND PELVIS WITH CONTRAST TECHNIQUE: Multidetector CT imaging of the abdomen and pelvis was performed using the standard protocol following  bolus administration of intravenous contrast. RADIATION DOSE REDUCTION: This exam was performed according to the departmental dose-optimization program which includes automated exposure control, adjustment of the mA and/or kV according to patient size and/or use of iterative reconstruction technique. CONTRAST:  137m OMNIPAQUE IOHEXOL 300 MG/ML  SOLN COMPARISON:  11/05/2021 FINDINGS: Lower chest: There is moderate right pleural effusion with atelectasis of the right lower lobe. Small left pleural effusion is noted with subsegmental atelectasis of the left lower lobe. No airspace consolidation. Hepatobiliary: No focal liver abnormality. Gallbladder appears normal. No  bile duct dilatation. Pancreas: Unremarkable. No pancreatic ductal dilatation or surrounding inflammatory changes. Spleen: Normal in size without focal abnormality. Adrenals/Urinary Tract: Normal adrenal glands. No kidney mass, nephrolithiasis or hydronephrosis. Bladder appears normal. Stomach/Bowel: Stomach is nondistended. Postop change from partial left colectomy with left lower quadrant colostomy is identified. Hartmann's pouch is identified. Enteric contrast material is identified up to the level of the proximal descending colon. There is mild edema involving the distal colon extending into the ostomy, image 64/3. The remaining bowel loops are unremarkable. Vascular/Lymphatic: No significant vascular findings are present. No enlarged abdominal or pelvic lymph nodes. Reproductive: Status post hysterectomy.  No adnexal mass. Other: Multiple fluid collections are identified within the abdomen and pelvis of variable maturity. The largest is in the left upper quadrant along the undersurface of the left hemidiaphragm and involving the spleen. This measures 10.5 x 6.3 cm, image 18/3. Fluid collection between the right hemidiaphragm and dome of liver measures 8.5 x 3.8 cm, image 73/6. Along the inferior medial margin of the right lobe of liver there is a fluid collection adjacent to the gallbladder measuring 7.6 x 5.3 cm, image 36/3. Smaller, less mature fluid collections are noted extending along the undersurface of bilateral ventral abdominal wall. There is also interloop fluid collection within the right lower quadrant of the abdomen. Extensive gas is identified within the subcutaneous soft tissues of the ventral abdominal wall compatible with early postoperative change. Musculoskeletal: No acute or significant osseous findings. IMPRESSION: 1. 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 There is mild edema involving the distal colon extending into the ostomy which may reflect postoperative  change. 2. Multiple fluid collections are identified within the abdomen and pelvis of variable maturity. The largest, and most mature fluid collections are in the upper abdomen along the undersurface of the hemidiaphragms and adjacent to the gallbladder. 3. Moderate right pleural effusion with atelectasis of the right lower lobe and small left pleural effusion. 4. These results were called by telephone at the time of interpretation on 11/10/2021 at 3:16 pm [facility] verbally acknowledged these results. Electronically Signed   By: TKerby MoorsM.D.   On: 11/10/2021 15:16  ? ?CT IMAGE GUIDED DRAINAGE BY PERCUTANEOUS CATHETER ? ?Result Date: 11/11/2021 ?INDICATION: 56year old female with history of perforated diverticulitis status post Hartmann's procedure with persistent intra-abdominal fluid collections, the largest in the subhepatic region. EXAM: CT IMAGE GUIDED DRAINAGE BY PERCUTANEOUS CATHETER COMPARISON:  11/05/2021, 11/10/2021 MEDICATIONS: The patient is currently admitted to the hospital and receiving intravenous antibiotics. The antibiotics were administered within an appropriate time frame prior to the initiation of the procedure. ANESTHESIA/SEDATION: Moderate (conscious) sedation was employed during this procedure. A total of Versed 2 mg and Fentanyl 75 mcg was administered intravenously. Moderate Sedation Time: 15 minutes. The patient's level of consciousness and vital signs were monitored continuously by radiology nursing throughout the procedure under my direct supervision. CONTRAST:  None COMPLICATIONS:  None immediate. PROCEDURE: Informed written consent was obtained from the patient after a discussion of the risks, benefits and alternatives to treatment. The patient was placed supine on the CT gantry and a pre procedural CT was performed re-demonstrating the known abscess/fluid collection within the subhepatic fluid collection containing a few foci of gas. The procedure was  planned. A timeout was performed prior to the initiation of the procedure. RADIATION DOSE REDUCTION: This exam was performed according to the departmental dose-optimization program which includes automated exposure c

## 2021-11-12 NOTE — Progress Notes (Signed)
Physical Therapy Treatment ?Patient Details ?Name: Michele [name] ?MRN: [id number] ?DOB: November 19, 1965 ?Today's Date: 11/12/2021 ? ? ?History of Present Illness Pt is a 56 y.o. admitted from GYN office on 11/05/21 with SOB and abdominal wall redness 10-days s/p open hysterectomy (10/25/21). Abdominal CT showed significant pneumoperitoneum with possible bowel perforation. S/p ex lap, sigmoid colectomy, and colostomy on 3/28. S/p RUQ subhepatic drain placement 4/3. PMH includes anxiety, anemia, HTN. ?  ?PT Comments  ? ? Pt progressing with mobility. Today's session focused on transfer training and ambulation for improving strength and activity tolerance. Pt remains limited by pain, requires frequent encouragement to perform tasks as independently as possible. Reviewed education, including importance of mobility; encouraged more frequent hallway ambulation with staff. Will continue to follow acutely to address established goals. ?   ?Recommendations for follow up therapy are one component of a multi-disciplinary discharge planning process, led by the attending physician.  Recommendations may be updated based on patient status, additional functional criteria and insurance authorization. ? ?Follow Up Recommendations ? No PT follow up ?  ?  ?Assistance Recommended at Discharge Intermittent Supervision/Assistance  ?Patient can return home with the following A little help with bathing/dressing/bathroom;Assistance with cooking/housework ?  ?Equipment Recommendations ? None recommended by PT  ?  ?Recommendations for Other Services   ? ? ?  ?Precautions / Restrictions Precautions ?Precautions: Fall;Other (comment) ?Precaution Comments: abdominal precautions for comfort, colostomy, abdominal wound vac, RUQ drain ?Restrictions ?Weight Bearing Restrictions: No  ?  ? ?Mobility ? Bed Mobility ?  ?  ?  ?  ?  ?  ?  ?General bed mobility comments: received sitting in recliner ?  ? ?Transfers ?Overall transfer level: Needs  assistance ?Equipment used: None ?Transfers: Sit to/from Stand ?Sit to Stand: Supervision ?  ?  ?  ?  ?  ?General transfer comment: able to stand from recliner and BSC (over toilet) with supervision for safety/lines ?  ? ?Ambulation/Gait ?Ambulation/Gait assistance: Min guard, Min assist ?Gait Distance (Feet): 620 Feet ?Assistive device: None, 1 person hand held assist ?Gait Pattern/deviations: Step-through pattern, Decreased stride length, Antalgic ?Gait velocity: Decreased ?  ?  ?General Gait Details: Slow, guarded, antalgic gait in room without DME, pt requesting UE support on IV pole which slowed gait even further, agreeable to increase gait speed with RUE HHA; intermittent minA for stability. Requires encouragement to maintain upright posture, increase gait speed and attempt ambulation without DME ? ? ?Stairs ?  ?  ?  ?  ?  ? ? ?Wheelchair Mobility ?  ? ?Modified Rankin (Stroke Patients Only) ?  ? ? ?  ?Balance Overall balance assessment: Needs assistance ?Sitting-balance support: No upper extremity supported, Feet supported ?Sitting balance-Leahy Scale: Good ?Sitting balance - Comments: difficulty reaching bilateral feet due to abdominal pain, but ultimately able to adjust bilateral socks with increased time ?  ?Standing balance support: No upper extremity supported, During functional activity ?Standing balance-Leahy Scale: Fair ?  ?  ?  ?  ?  ?  ?  ?  ?  ?  ?  ?  ?  ? ?  ?Cognition Arousal/Alertness: Awake/alert ?Behavior During Therapy: [facility] for tasks assessed/performed, Anxious ?Overall Cognitive Status: Within Functional Limits for tasks assessed ?  ?  ?  ?  ?  ?  ?  ?  ?  ?  ?  ?  ?  ?  ?  ?  ?General Comments: WFL for simple tasks, not formally assessed. Requires frequent cues and encouragement to mobilize  as independently as possible ?  ?  ? ?  ?Exercises   ? ?  ?General Comments General comments (skin integrity, edema, etc.): Requires cues to perform tasks as independently as possible. Reviewed educ on  activity recommendations and importance of mobility, ambulating in hallway at least 3x/day (therapy, mobility specialists, nursing). Sister present at end of session, supportive ?  ?  ? ?Pertinent Vitals/Pain Pain Assessment ?Pain Assessment: Faces ?Faces Pain Scale: Hurts little more ?Pain Location: abdomen, lower back ?Pain Descriptors / Indicators: Sore, Discomfort ?Pain Intervention(s): Monitored during session, Limited activity within patient's tolerance, Premedicated before session  ? ? ?Home Living   ?  ?  ?  ?  ?  ?  ?  ?  ?  ?   ?  ?Prior Function    ?  ?  ?   ? ?PT Goals (current goals can now be found in the care plan section) Progress towards PT goals: Progressing toward goals ? ?  ?Frequency ? ? ? Min 3X/week ? ? ? ?  ?PT Plan Current plan remains appropriate  ? ? ?Co-evaluation   ?  ?  ?  ?  ? ?  ?AM-PAC PT "6 Clicks" Mobility   ?Outcome Measure ? Help needed turning from your back to your side while in a flat bed without using bedrails?: A Little ?Help needed moving from lying on your back to sitting on the side of a flat bed without using bedrails?: A Little ?Help needed moving to and from a bed to a chair (including a wheelchair)?: A Little ?Help needed standing up from a chair using your arms (e.g., wheelchair or bedside chair)?: A Little ?Help needed to walk in hospital room?: A Little ?Help needed climbing 3-5 steps with a railing? : A Little ?6 Click Score: 18 ? ?  ?End of Session   ?Activity Tolerance: Patient tolerated treatment well ?Patient left: in chair;with call bell/phone within reach;with family/visitor present ?Nurse Communication: Mobility status ?PT Visit Diagnosis: Other abnormalities of gait and mobility (R26.89);Difficulty in walking, not elsewhere classified (R26.2);Pain ?  ? ? ?Time: 1245-8099 ?PT Time Calculation (min) (ACUTE ONLY): 27 min ? ?Charges:  $Therapeutic Exercise: 8-22 mins ?$Therapeutic Activity: 8-22 mins          ?          ? ?Mabeline Caras, PT, DPT ?Acute  Rehabilitation Services  ?Pager [phone] ?Office [phone] ? ?Derry Lory ?11/12/2021, 3:04 PM ? ?

## 2021-11-12 NOTE — Progress Notes (Signed)
Pt AM K+: 2.8 and 0400 POC CBG: 69. Pt A&Ox4, denies s/s of hypoglycemia. Pt currently drinking 4 oz of soda, plan to re-check CBG when complete. [facility] on-call, Grandville Silos, MD notified of K+ and CBG result. MD okay w/ current hypoglycemia treatment plan and placed orders for PO K+ 40 mEq powder 2x daily. Will continue to monitor.  ?

## 2021-11-12 NOTE — Plan of Care (Signed)

## 2021-11-12 NOTE — Progress Notes (Signed)
7 Days Post-Op  ? ?Subjective/Chief Complaint: ? ?NAEO. Having abd pain that limits deep breathing/movement. Having flatus/stool via ostomy. Denies belching/nausea ? ?Objective: ?Vital signs in last 24 hours: ?Temp:  [98.3 ?F (36.8 ?C)-99.1 ?F (37.3 ?C)] 99.1 ?F (37.3 ?C) (04/04 0240) ?Pulse Rate:  [81-114] 90 (04/04 0737) ?Resp:  [17-24] 17 (04/04 0737) ?BP: (119-149)/(54-79) 142/79 (04/04 0737) ?SpO2:  [88 %-100 %] 98 % (04/04 0737) ?Last BM Date :  (ostomy) ? ?Intake/Output from previous day: ?04/03 0701 - 04/04 0700 ?In: 1756.5 [I.V.:1264.4; IV Piggyback:492.1] ?Out: 151 [Drains:101; Stool:50] ?Intake/Output this shift: ?No intake/output data recorded. ? ?General appearance: alert and cooperative ?GI: soft, appropriately tender, NPWT in place, ostomy with gas/liquid stool in pouch, RUQ drain serous  ? ?Lab Results:  ?Recent Labs  ?  11/11/21 ?0303 11/12/21 ?0103  ?WBC 20.9* 16.4*  ?HGB 9.6* 9.6*  ?HCT 29.7* 28.9*  ?PLT 662* 632*  ? ?BMET ?Recent Labs  ?  11/11/21 ?0303 11/12/21 ?0103  ?NA 133* 135  ?K 3.7 2.8*  ?CL 100 101  ?CO2 23 25  ?GLUCOSE 103* 124*  ?BUN 6 <5*  ?CREATININE 0.64 0.56  ?CALCIUM 7.6* 7.5*  ? ?PT/INR ?No results for input(s): LABPROT, INR in the last 72 hours. ?ABG ?No results for input(s): PHART, HCO3 in the last 72 hours. ? ?Invalid input(s): PCO2, PO2 ? ?Studies/Results: ?CT ABDOMEN PELVIS W CONTRAST ? ?Result Date: 11/10/2021 ?CLINICAL DATA:  Abdominal pain status post exploratory laparotomy and colectomy on 11/05/2021. EXAM: CT ABDOMEN AND PELVIS WITH CONTRAST TECHNIQUE: Multidetector CT imaging of the abdomen and pelvis was performed using the standard protocol following bolus administration of intravenous contrast. RADIATION DOSE REDUCTION: This exam was performed according to the departmental dose-optimization program which includes automated exposure control, adjustment of the mA and/or kV according to patient size and/or use of iterative reconstruction technique. CONTRAST:  163m  OMNIPAQUE IOHEXOL 300 MG/ML  SOLN COMPARISON:  11/05/2021 FINDINGS: Lower chest: There is moderate right pleural effusion with atelectasis of the right lower lobe. Small left pleural effusion is noted with subsegmental atelectasis of the left lower lobe. No airspace consolidation. Hepatobiliary: No focal liver abnormality. Gallbladder appears normal. No bile duct dilatation. Pancreas: Unremarkable. No pancreatic ductal dilatation or surrounding inflammatory changes. Spleen: Normal in size without focal abnormality. Adrenals/Urinary Tract: Normal adrenal glands. No kidney mass, nephrolithiasis or hydronephrosis. Bladder appears normal. Stomach/Bowel: Stomach is nondistended. Postop change from partial left colectomy with left lower quadrant colostomy is identified. Hartmann's pouch is identified. Enteric contrast material is identified up to the level of the proximal descending colon. There is mild edema involving the distal colon extending into the ostomy, image 64/3. The remaining bowel loops are unremarkable. Vascular/Lymphatic: No significant vascular findings are present. No enlarged abdominal or pelvic lymph nodes. Reproductive: Status post hysterectomy.  No adnexal mass. Other: Multiple fluid collections are identified within the abdomen and pelvis of variable maturity. The largest is in the left upper quadrant along the undersurface of the left hemidiaphragm and involving the spleen. This measures 10.5 x 6.3 cm, image 18/3. Fluid collection between the right hemidiaphragm and dome of liver measures 8.5 x 3.8 cm, image 73/6. Along the inferior medial margin of the right lobe of liver there is a fluid collection adjacent to the gallbladder measuring 7.6 x 5.3 cm, image 36/3. Smaller, less mature fluid collections are noted extending along the undersurface of bilateral ventral abdominal wall. There is also interloop fluid collection within the right lower quadrant of the abdomen. Extensive gas  is identified  within the subcutaneous soft tissues of the ventral abdominal wall compatible with early postoperative change. Musculoskeletal: No acute or significant osseous findings. IMPRESSION: 1. 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 There is mild edema involving the distal colon extending into the ostomy which may reflect postoperative change. 2. Multiple fluid collections are identified within the abdomen and pelvis of variable maturity. The largest, and most mature fluid collections are in the upper abdomen along the undersurface of the hemidiaphragms and adjacent to the gallbladder. 3. Moderate right pleural effusion with atelectasis of the right lower lobe and small left pleural effusion. 4. These results were called by telephone at the time of interpretation on 11/10/2021 at 3:16 pm to provider [facility] , who verbally acknowledged these results. Electronically Signed   By: Kerby Moors M.D.   On: 11/10/2021 15:16  ? ?CT IMAGE GUIDED DRAINAGE BY PERCUTANEOUS CATHETER ? ?Result Date: 11/11/2021 ?INDICATION: 56 year old female with history of perforated diverticulitis status post Hartmann's procedure with persistent intra-abdominal fluid collections, the largest in the subhepatic region. EXAM: CT IMAGE GUIDED DRAINAGE BY PERCUTANEOUS CATHETER COMPARISON:  11/05/2021, 11/10/2021 MEDICATIONS: The patient is currently admitted to the hospital and receiving intravenous antibiotics. The antibiotics were administered within an appropriate time frame prior to the initiation of the procedure. ANESTHESIA/SEDATION: Moderate (conscious) sedation was employed during this procedure. A total of Versed 2 mg and Fentanyl 75 mcg was administered intravenously. Moderate Sedation Time: 15 minutes. The patient's level of consciousness and vital signs were monitored continuously by radiology nursing throughout the procedure under my direct supervision. CONTRAST:  None COMPLICATIONS: None immediate.  PROCEDURE: Informed written consent was obtained from the patient after a discussion of the risks, benefits and alternatives to treatment. The patient was placed supine on the CT gantry and a pre procedural CT was performed re-demonstrating the known abscess/fluid collection within the subhepatic fluid collection containing a few foci of gas. The procedure was planned. A timeout was performed prior to the initiation of the procedure. RADIATION DOSE REDUCTION: This exam was performed according to the departmental dose-optimization program which includes automated exposure control, adjustment of the mA and/or kV according to patient size and/or use of iterative reconstruction technique. The right upper quadrant was prepped and draped in the usual sterile fashion. The overlying soft tissues were anesthetized with 1% lidocaine with epinephrine. Appropriate trajectory was planned with the use of a 22 gauge spinal needle. An 18 gauge trocar needle was advanced into the abscess/fluid collection and a short Amplatz super stiff wire was coiled within the collection. Appropriate positioning was confirmed with a limited CT scan. The tract was serially dilated allowing placement of a 10 [place] all-purpose drainage catheter. Appropriate positioning was confirmed with a limited postprocedural CT scan. Approximately 80 ml of opaque, yellow fluid was aspirated. The tube was connected to a bulb suction and sutured in place. A dressing was placed. The patient tolerated the procedure well without immediate post procedural complication. IMPRESSION: Successful CT guided placement of a 10.2 [place] all purpose drain catheter into the subhepatic fluid collection with aspiration of 80 mL of opaque, yellow fluid. Samples were sent to the laboratory as requested by the ordering clinical team. Ruthann Cancer, MD [facility] Electronically Signed   By: Ruthann Cancer M.D.   On: 11/11/2021  13:59   ? ?Anti-infectives: ?Anti-infectives (From admission, onward)  ? ? Start     Dose/Rate Route Frequency Ordered Stop  ? 11/05/21  2100  piperacillin-tazobactam (ZOSYN) IVPB 3.375 g       ? 3.375 g ?12.5 mL/hr ov

## 2021-11-12 NOTE — Progress Notes (Signed)
Held patient's morning meds because patient stated that she needed to eat before she took her medicines. I also had to run patient's IV potassium at a lower rate because it was burning ?

## 2021-11-13 ENCOUNTER — Inpatient Hospital Stay (HOSPITAL_COMMUNITY): Payer: BC Managed Care – PPO

## 2021-11-13 LAB — GLUCOSE, CAPILLARY
Glucose-Capillary: 105 mg/dL — ABNORMAL HIGH (ref 70–99)
Glucose-Capillary: 136 mg/dL — ABNORMAL HIGH (ref 70–99)
Glucose-Capillary: 142 mg/dL — ABNORMAL HIGH (ref 70–99)
Glucose-Capillary: 148 mg/dL — ABNORMAL HIGH (ref 70–99)
Glucose-Capillary: 169 mg/dL — ABNORMAL HIGH (ref 70–99)
Glucose-Capillary: 195 mg/dL — ABNORMAL HIGH (ref 70–99)

## 2021-11-13 LAB — BASIC METABOLIC PANEL
Anion gap: 7 (ref 5–15)
BUN: 9 mg/dL (ref 6–20)
CO2: 26 mmol/L (ref 22–32)
Calcium: 7.6 mg/dL — ABNORMAL LOW (ref 8.9–10.3)
Chloride: 100 mmol/L (ref 98–111)
Creatinine, Ser: 0.7 mg/dL (ref 0.44–1.00)
GFR, Estimated: >60 mL/min (ref >60)
Glucose, Bld: 188 mg/dL — ABNORMAL HIGH (ref 70–99)
Potassium: 3.7 mmol/L (ref 3.5–5.1)
Sodium: 133 mmol/L — ABNORMAL LOW (ref 135–145)

## 2021-11-13 LAB — CBC
HCT: 27.1 % — ABNORMAL LOW (ref 36.0–46.0)
Hemoglobin: 8.6 g/dL — ABNORMAL LOW (ref 12.0–15.0)
MCH: 25.7 pg — ABNORMAL LOW (ref 26.0–34.0)
MCHC: 31.7 g/dL (ref 30.0–36.0)
MCV: 80.9 fL (ref 80.0–100.0)
Platelets: 712 10*3/uL — ABNORMAL HIGH (ref 150–400)
RBC: 3.35 MIL/uL — ABNORMAL LOW (ref 3.87–5.11)
RDW: 15.9 % — ABNORMAL HIGH (ref 11.5–15.5)
WBC: 16 10*3/uL — ABNORMAL HIGH (ref 4.0–10.5)
nRBC: 0.1 % (ref 0.0–0.2)

## 2021-11-13 MED ORDER — AMPHETAMINE-DEXTROAMPHET ER 10 MG PO CP24
30.0000 mg | ORAL_CAPSULE | Freq: Every day | ORAL | Status: DC
  Administered 2021-11-13 – 2021-11-18 (×2): 30 mg via ORAL
  Filled 2021-11-13 (×8): qty 3

## 2021-11-13 MED ORDER — IBUPROFEN 600 MG PO TABS: 600.0000 mg | ORAL_TABLET | Freq: Three times a day (TID) | ORAL | Status: DC

## 2021-11-13 MED ORDER — POTASSIUM CHLORIDE 20 MEQ PO PACK
20.0000 meq | PACK | Freq: Two times a day (BID) | ORAL | Status: DC
  Administered 2021-11-13 – 2021-11-19 (×13): 20 meq via ORAL
  Filled 2021-11-13 (×15): qty 1

## 2021-11-13 MED ORDER — FUROSEMIDE 10 MG/ML IJ SOLN
20.0000 mg | Freq: Once | INTRAMUSCULAR | Status: AC
  Administered 2021-11-13: 20 mg via INTRAVENOUS
  Filled 2021-11-13: qty 2

## 2021-11-13 MED ORDER — KETOROLAC TROMETHAMINE 15 MG/ML IJ SOLN
15.0000 mg | Freq: Three times a day (TID) | INTRAMUSCULAR | Status: AC
  Administered 2021-11-13 – 2021-11-18 (×14): 15 mg via INTRAVENOUS
  Filled 2021-11-13 (×14): qty 1

## 2021-11-13 MED ORDER — METHOCARBAMOL 1000 MG/10ML IJ SOLN
1000.0000 mg | Freq: Four times a day (QID) | INTRAVENOUS | Status: DC
  Administered 2021-11-13 – 2021-11-19 (×23): 1000 mg via INTRAVENOUS
  Filled 2021-11-13 (×3): qty 10
  Filled 2021-11-13: qty 1000
  Filled 2021-11-13 (×9): qty 10
  Filled 2021-11-13: qty 1000
  Filled 2021-11-13 (×13): qty 10

## 2021-11-13 MED ORDER — CHILDRENS CHEW MULTIVITAMIN PO CHEW
1.0000 | CHEWABLE_TABLET | Freq: Every day | ORAL | Status: DC
  Administered 2021-11-13: 1 via ORAL
  Filled 2021-11-13 (×2): qty 1

## 2021-11-13 MED ORDER — JUVEN PO PACK
1.0000 | PACK | Freq: Two times a day (BID) | ORAL | Status: DC
  Administered 2021-11-13 – 2021-11-29 (×23): 1 via ORAL
  Filled 2021-11-13 (×25): qty 1

## 2021-11-13 MED ORDER — CITALOPRAM HYDROBROMIDE 20 MG PO TABS
20.0000 mg | ORAL_TABLET | Freq: Every day | ORAL | Status: DC
  Administered 2021-11-13 – 2021-11-29 (×16): 20 mg via ORAL
  Filled 2021-11-13 (×17): qty 1

## 2021-11-13 NOTE — Consult Note (Addendum)
[place] Nurse ostomy follow up ?Stoma type/location: LLQ colostomy. Mucocutaneous separation from 1 to 5 o'clock. OS is below skin level at 3 o'clock. Lumen can be digitized.  ?Stomal assessment/size: oval 3 cm wide, 1.5 cm length  pattern at bedside.  ?Peristomal assessment: Separation, stomal os below skin level at 3 o;clock ?Treatment options for stomal/peristomal skin: barrier ring to peristomal skin, do not cover separation with ring as the os will not empty.  ?Output soft brown stool.  Scant amount in pouch today.  Suspicious for ileus due to nausea, less noise from ostomy ?Ostomy pouching: 1pc.convex with barrier ring ?Education provided: See above.  Discussed need for teachable caregiver for wound care and ostomy care.  Friend at bedside will be here Friday and will participate in teaching.  She had stepped out of the room earlier when pouch and wound being changed.  ?Enrolled patient in [place] Start Discharge program: Yes/ ? ?[place] Nurse wound follow up ?Wound type:midline abdominal wound, some fascial separation noted, will stop NPWT today and switch to twice daily NS moist gauze packing, top with ABD pad and tape.  ?Measurement: 18 cm x 8 cm x 6.2 cm  blue sutures visible and depth noted beyond that level.  ?Wound bed: 50% beefy red, 20 % adipose tissue and 30% pale pink nongranulating ?Drainage (amount, consistency, odor) moderate serosanguinous  NPWT canister is discarded with 500ML effluent in canister.  ?Periwound: LLQ colostomy ?Dressing procedure/placement/frequency:  Stop NPWT.  CLeanse midline wound with NS and pat dry.  Gently fill wound defect with NS moist kerlix.  Cover with dry gauze and ABD pad/tape. Change twice daily.   Will wear abdominal binder. ?Will follow.  ?Domenic Moras MSN, RN, FNP-BC CWON ?Wound, Ostomy, Continence Nurse ?Pager [phone]   ?

## 2021-11-13 NOTE — Progress Notes (Signed)
? ? ?Referring Physician(s): Ralene Ok  ? ?Supervising Physician: Corrie Mckusick ? ?Patient Status:  [facility] - In-pt ? ?Chief Complaint: ? ?S/p ex lap for perforated diverticulitis with colectomy and colostomy creation on 0/27, recovery complicated by intra-abdominal fluid collection development, s/p RUQ drain placement y Dr. Serafina Royals on 11/11/21.  ? ?Subjective: ? ?Patient sitting in a chair, sister at bedside.  ?Reports persistent pain in her lower abdomen.  ?No complaints regarding the drain, denies N/V.  ?No expected d/c date yet per patient.  ?Discussed drain care and follow up on 4/4 ? ?Allergies: ?Lipitor [atorvastatin] and Sulfa antibiotics ? ?Medications: ?Prior to Admission medications   ?Medication Sig Start Date End Date Taking? Authorizing Provider  ?ADDERALL XR 30 MG 24 hr capsule Take 30 mg by mouth daily. 09/23/21  Yes [provider]  ?citalopram (CELEXA) 20 MG tablet Take 20 mg by mouth daily.   Yes [provider]  ?CRESTOR 5 MG tablet TAKE 1 TABLET BY MOUTH EVERY DAY ?Patient taking differently: Take 5 mg by mouth daily. 04/17/15  Yes Dorothy Spark, MD  ?ibuprofen (ADVIL) 600 MG tablet Take 1 tablet (600 mg total) by mouth every 6 (six) hours as needed for cramping or moderate pain. 10/28/21  Yes Sanjuana Kava, MD  ?oxyCODONE-acetaminophen (PERCOCET/ROXICET) 5-325 MG tablet Take 1-2 tablets by mouth every 4 (four) hours as needed for severe pain or moderate pain. 10/28/21  Yes Sanjuana Kava, MD  ?simethicone (MYLICON) 253 MG chewable tablet Chew 125 mg by mouth every 6 (six) hours as needed for flatulence.   Yes [provider]  ? ? ? ?Vital Signs: ?BP 125/62 (BP Location: Left Arm)   Pulse (!) 108   Temp 98.3 ?F (36.8 ?C) (Oral)   Resp 18   Ht 5' (1.524 m)   Wt 221 lb 5.5 oz (100.4 kg)   LMP 10/07/2021 (Approximate)   SpO2 92%   BMI 43.23 kg/m?  ? ?Physical Exam ?Vitals reviewed.  ?Constitutional:   ?   General: She is not in acute distress. ?   Appearance: She is  well-developed. She is not ill-appearing.  ?HENT:  ?   Head: Normocephalic and atraumatic.  ?Pulmonary:  ?   Effort: Pulmonary effort is normal.  ?Abdominal:  ?   Palpations: Abdomen is soft.  ?Skin: ?   General: Skin is warm and dry.  ?   Coloration: Skin is not cyanotic or pale.  ?   Comments: Positive RUQ drain to a suction bulb. Site is unremarkable with no erythema, edema, tenderness, bleeding or drainage. Suture and stat lock in place. Dressing is clean, dry, and intact. Trace of clear fluid noted in the bulb. Drain flushes and aspirates well.  ?  ?Neurological:  ?   Mental Status: She is alert and oriented to person, place, and time.  ?Psychiatric:     ?   Mood and Affect: Mood normal.     ?   Behavior: Behavior normal.  ? ? ?Imaging: ?CT ABDOMEN PELVIS W CONTRAST ? ?Result Date: 11/10/2021 ?CLINICAL DATA:  Abdominal pain status post exploratory laparotomy and colectomy on 11/05/2021. EXAM: CT ABDOMEN AND PELVIS WITH CONTRAST TECHNIQUE: Multidetector CT imaging of the abdomen and pelvis was performed using the standard protocol following bolus administration of intravenous contrast. RADIATION DOSE REDUCTION: This exam was performed according to the departmental dose-optimization program which includes automated exposure control, adjustment of the mA and/or kV according to patient size and/or use of iterative reconstruction technique. CONTRAST:  158m OMNIPAQUE  IOHEXOL 300 MG/ML  SOLN COMPARISON:  11/05/2021 FINDINGS: Lower chest: There is moderate right pleural effusion with atelectasis of the right lower lobe. Small left pleural effusion is noted with subsegmental atelectasis of the left lower lobe. No airspace consolidation. Hepatobiliary: No focal liver abnormality. Gallbladder appears normal. No bile duct dilatation. Pancreas: Unremarkable. No pancreatic ductal dilatation or surrounding inflammatory changes. Spleen: Normal in size without focal abnormality. Adrenals/Urinary Tract: Normal adrenal glands. No  kidney mass, nephrolithiasis or hydronephrosis. Bladder appears normal. Stomach/Bowel: Stomach is nondistended. Postop change from partial left colectomy with left lower quadrant colostomy is identified. Hartmann's pouch is identified. Enteric contrast material is identified up to the level of the proximal descending colon. There is mild edema involving the distal colon extending into the ostomy, image 64/3. The remaining bowel loops are unremarkable. Vascular/Lymphatic: No significant vascular findings are present. No enlarged abdominal or pelvic lymph nodes. Reproductive: Status post hysterectomy.  No adnexal mass. Other: Multiple fluid collections are identified within the abdomen and pelvis of variable maturity. The largest is in the left upper quadrant along the undersurface of the left hemidiaphragm and involving the spleen. This measures 10.5 x 6.3 cm, image 18/3. Fluid collection between the right hemidiaphragm and dome of liver measures 8.5 x 3.8 cm, image 73/6. Along the inferior medial margin of the right lobe of liver there is a fluid collection adjacent to the gallbladder measuring 7.6 x 5.3 cm, image 36/3. Smaller, less mature fluid collections are noted extending along the undersurface of bilateral ventral abdominal wall. There is also interloop fluid collection within the right lower quadrant of the abdomen. Extensive gas is identified within the subcutaneous soft tissues of the ventral abdominal wall compatible with early postoperative change. Musculoskeletal: No acute or significant osseous findings. IMPRESSION: 1. 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 There is mild edema involving the distal colon extending into the ostomy which may reflect postoperative change. 2. Multiple fluid collections are identified within the abdomen and pelvis of variable maturity. The largest, and most mature fluid collections are in the upper abdomen along the undersurface of the  hemidiaphragms and adjacent to the gallbladder. 3. Moderate right pleural effusion with atelectasis of the right lower lobe and small left pleural effusion. 4. These results were called by telephone at the time of interpretation on 11/10/2021 at 3:16 pm to provider [facility] , who verbally acknowledged these results. Electronically Signed   By: Kerby Moors M.D.   On: 11/10/2021 15:16  ? ?CT IMAGE GUIDED DRAINAGE BY PERCUTANEOUS CATHETER ? ?Result Date: 11/11/2021 ?INDICATION: 56 year old female with history of perforated diverticulitis status post Hartmann's procedure with persistent intra-abdominal fluid collections, the largest in the subhepatic region. EXAM: CT IMAGE GUIDED DRAINAGE BY PERCUTANEOUS CATHETER COMPARISON:  11/05/2021, 11/10/2021 MEDICATIONS: The patient is currently admitted to the hospital and receiving intravenous antibiotics. The antibiotics were administered within an appropriate time frame prior to the initiation of the procedure. ANESTHESIA/SEDATION: Moderate (conscious) sedation was employed during this procedure. A total of Versed 2 mg and Fentanyl 75 mcg was administered intravenously. Moderate Sedation Time: 15 minutes. The patient's level of consciousness and vital signs were monitored continuously by radiology nursing throughout the procedure under my direct supervision. CONTRAST:  None COMPLICATIONS: None immediate. PROCEDURE: Informed written consent was obtained from the patient after a discussion of the risks, benefits and alternatives to treatment. The patient was placed supine on the CT gantry and a pre procedural CT was performed re-demonstrating the known abscess/fluid collection  within the subhepatic fluid collection containing a few foci of gas. The procedure was planned. A timeout was performed prior to the initiation of the procedure. RADIATION DOSE REDUCTION: This exam was performed according to the departmental dose-optimization program which includes automated  exposure control, adjustment of the mA and/or kV according to patient size and/or use of iterative reconstruction technique. The right upper quadrant was prepped and draped in the usual sterile fashion. The overlying soft t

## 2021-11-13 NOTE — Progress Notes (Addendum)
8 Days Post-Op  ? ?Subjective/Chief Complaint: ?Reports increased abd pain today -- having more upper abdominal discomfort/pressure as well as intermitting sharp pains in right flank. Also reports bloating, less gas/stool via ostomy, and decreased appetite (refused breakfast). Denies nausea or vomiting. Voiding ok.  ? ?Objective: ?Vital signs in last 24 hours: ?Temp:  [98.2 ?F (36.8 ?C)-98.7 ?F (37.1 ?C)] 98.3 ?F (36.8 ?C) (04/05 0272) ?Pulse Rate:  [106-110] 108 (04/05 0748) ?Resp:  [16-18] 18 (04/05 0748) ?BP: (121-125)/(62-80) 125/62 (04/05 0748) ?SpO2:  [92 %-95 %] 92 % (04/05 0748) ?Last BM Date : 11/12/21 ? ?Intake/Output from previous day: ?04/04 0701 - 04/05 0700 ?In: 345.9 [IV Piggyback:335.9] ?Out: 210 [Drains:10; ZDGUY:403] ?Intake/Output this shift: ?No intake/output data recorded. ? ?General appearance: alert and cooperative; appears uncomfortable but not in acute distress ?GI: soft, some distention, appropriately tender, RUQ drain serous  ?Midline wound with increased granular tissue of wound perimeter. The inferior portion of the wound, below umbilicus, has fascial dehiscence (~1-2cm) without evisceration. My finger can pass below the level of the fascia between the sutures. ?Stoma with some mucocutaneous separation that appears stable. No cellulitis. Stoma digitzed without issue.  ? ? ? ?Extremities -some edema in hands bilaterally, bilateral pedal and lower leg edema.  ? ?Lab Results:  ?Recent Labs  ?  11/12/21 ?0103 11/13/21 ?0150  ?WBC 16.4* 16.0*  ?HGB 9.6* 8.6*  ?HCT 28.9* 27.1*  ?PLT 632* 712*  ? ?BMET ?Recent Labs  ?  11/12/21 ?1511 11/13/21 ?0150  ?NA 132* 133*  ?K 3.4* 3.7  ?CL 100 100  ?CO2 24 26  ?GLUCOSE 222* 188*  ?BUN 7 9  ?CREATININE 0.71 0.70  ?CALCIUM 7.5* 7.6*  ? ?PT/INR ?No results for input(s): LABPROT, INR in the last 72 hours. ?ABG ?No results for input(s): PHART, HCO3 in the last 72 hours. ? ?Invalid input(s): PCO2, PO2 ? ?Studies/Results: ?CT IMAGE GUIDED DRAINAGE BY  PERCUTANEOUS CATHETER ? ?Result Date: 11/11/2021 ?INDICATION: 56 year old female with history of perforated diverticulitis status post Hartmann's procedure with persistent intra-abdominal fluid collections, the largest in the subhepatic region. EXAM: CT IMAGE GUIDED DRAINAGE BY PERCUTANEOUS CATHETER COMPARISON:  11/05/2021, 11/10/2021 MEDICATIONS: The patient is currently admitted to the hospital and receiving intravenous antibiotics. The antibiotics were administered within an appropriate time frame prior to the initiation of the procedure. ANESTHESIA/SEDATION: Moderate (conscious) sedation was employed during this procedure. A total of Versed 2 mg and Fentanyl 75 mcg was administered intravenously. Moderate Sedation Time: 15 minutes. The patient's level of consciousness and vital signs were monitored continuously by radiology nursing throughout the procedure under my direct supervision. CONTRAST:  None COMPLICATIONS: None immediate. PROCEDURE: Informed written consent was obtained from the patient after a discussion of the risks, benefits and alternatives to treatment. The patient was placed supine on the CT gantry and a pre procedural CT was performed re-demonstrating the known abscess/fluid collection within the subhepatic fluid collection containing a few foci of gas. The procedure was planned. A timeout was performed prior to the initiation of the procedure. RADIATION DOSE REDUCTION: This exam was performed according to the departmental dose-optimization program which includes automated exposure control, adjustment of the mA and/or kV according to patient size and/or use of iterative reconstruction technique. The right upper quadrant was prepped and draped in the usual sterile fashion. The overlying soft tissues were anesthetized with 1% lidocaine with epinephrine. Appropriate trajectory was planned with the use of a 22 gauge spinal needle. An 18 gauge trocar needle was advanced into the abscess/fluid  collection  and a short Amplatz super stiff wire was coiled within the collection. Appropriate positioning was confirmed with a limited CT scan. The tract was serially dilated allowing placement of a 10 [place] all-purpose drainage catheter. Appropriate positioning was confirmed with a limited postprocedural CT scan. Approximately 80 ml of opaque, yellow fluid was aspirated. The tube was connected to a bulb suction and sutured in place. A dressing was placed. The patient tolerated the procedure well without immediate post procedural complication. IMPRESSION: Successful CT guided placement of a 10.2 [place] all purpose drain catheter into the subhepatic fluid collection with aspiration of 80 mL of opaque, yellow fluid. Samples were sent to the laboratory as requested by the ordering clinical team. Ruthann Cancer, MD [facility] Electronically Signed   By: Ruthann Cancer M.D.   On: 11/11/2021 13:59   ? ?Anti-infectives: ?Anti-infectives (From admission, onward)  ? ? Start     Dose/Rate Route Frequency Ordered Stop  ? 11/05/21 2100  piperacillin-tazobactam (ZOSYN) IVPB 3.375 g       ? 3.375 g ?12.5 mL/hr over 240 Minutes Intravenous Every 8 hours 11/05/21 2010    ? 11/05/21 1500  piperacillin-tazobactam (ZOSYN) IVPB 3.375 g       ? 3.375 g ?100 mL/hr over 30 Minutes Intravenous  Once 11/05/21 1451 11/05/21 1600  ? ?  ? ? ?Assessment/Plan: ?POD 8, s/p ex lap with Hartmann's procedure for perforated diverticulitis by Dr. Grandville Silos 3/28 ?- afebrile,WBC 23 > 20 > 16 > 16 ?- CT A/P 4/2 w/ multiple intra-abd fluid collections, two large collections in upper abd.  ?- s/p IR drain RUQ 4/3, serous, follow Cx ?- bowel function decreased and clinical signs of ileus as per HPI. Encouraged mostly liquid diet and no to force PO intake. Transition some of her meds to IV for better absorption and patient comfort. ?- mobilizew as tolerated ?-pulm toilet ?-WOC following for new colostomy; D/C  NPWT and switch to wet to dry to follow fascial dehiscence. May be able to replace VAC in a few days. Abdominal binder ordered - RN to cut hole for ostomy. ? ? ?FEN - SOFT, ensure; hypokalemia resolved (3.7) - continue daily potassium supplementation. upper and lower extremity edema. 7.5L net + this admission - give 20 mg IV lasix today. ? ?VTE - Lovenox ?ID - zosyn 3/28 >> continue ?Foley - D/C 3/30  ?  ?AKI - resolved, 0.77 ?S/p hysterectomy 10/25/21 ?HTN - but don't see any home meds for this.  Prns available ?Restart home meds  - celexa and adderall  ? ? LOS: 8 days  ? ? ?Mathews ?11/13/2021 ? ?

## 2021-11-13 NOTE — TOC Progression Note (Signed)
Transition of Care (TOC) - Progression Note  ? ? ?Patient Details  ?Name: LAURRIE TOPPIN ?MRN: [id number] ?Date of Birth: March 27, 1966 ? ?Transition of Care (TOC) CM/SW Contact  ?Marilu Favre, RN ?Phone Number: ?11/13/2021, 12:30 PM ? ?Clinical Narrative:    ? ?Updated Hoyle Sauer with [facility] and [place] with 58M  ? ?Expected Discharge Plan: [place] ?Barriers to Discharge: Continued Medical Work up ? ?Expected Discharge Plan and Services ?Expected Discharge Plan: [place] ?  ?Discharge Planning Services: CM Consult ?Post Acute Care Choice: Home Health ?Living arrangements for the past 2 months: [place] ?                ?  ?  ?  ?  ?  ?HH Arranged: RN ?  ?  ?  ?  ? ? ?Social Determinants of Health (SDOH) Interventions ?  ? ?Readmission Risk Interventions ?   ? View : No data to display.  ?  ?  ?  ? ? ?

## 2021-11-13 NOTE — Progress Notes (Signed)
Nutrition Follow-up ? ?DOCUMENTATION CODES:  ?Obesity unspecified ? ?INTERVENTION:  ?Continue current diet as ordered, encourage PO intake as tolerated ?Continue Ensure Enlive po TID, each supplement provides 350 kcal and 20 grams of protein. ?1 packet Juven BID, each packet provides 95 calories, 2.5 grams of protein (collagen), and 9.8 grams of carbohydrate (3 grams sugar); also contains 7 grams of L-arginine and L-glutamine, 300 mg vitamin C, 15 mg vitamin E, 1.2 mcg vitamin B-12, 9.5 mg zinc, 200 mg calcium, and 1.5 g  Calcium Beta-hydroxy-Beta-methylbutyrate to support wound healing ?Chewable multivitamin daily for wound healing ? ?NUTRITION DIAGNOSIS:  ?Inadequate oral intake related to inability to eat as evidenced by NPO status. ?- remains applicable ? ?GOAL:  ?Patient will meet greater than or equal to 90% of their needs ?- progressing, diet in place, supplements in place ? ?MONITOR:  ?Diet advancement, Labs, I & O's, Weight trends ? ?REASON FOR ASSESSMENT:  ?NPO/Clear Liquid Diet, Other (Comment) (new colostomy) ?  ? ?ASSESSMENT:  ?56 year old female with hx of HTN, anxiety, and anemia presented from GYN office for decreased appetite, SOB, and redness to abdominal wall. CT showed significant for pneumoperitoneum with possible bowel perforation. ? ?Prior to admission: 3/18 - Op, ex lap with total abdominal hysterectomy with bilateral salpingectomy  ?3/28 - Op, ex lap with sigmoid colectomy, colostomy creation, and hartman's procedure.  ?3/29 - Wound vac placed ?4/5 - wound vac removed ? ?Pt resting in bedside chair at the time of assessment. States that she is mostly consuming liquids as she developed gas, pain, and decreased output from ostomy.   ? ?Noted that wound vac was removed today and WOC RN noted  some fascial dehiscence. Discussed intake over the last week with pt. Based on reported intake, sounds inadequate to meet needs of wound healing. Poor appetite due to pain and GI distress. ? ?Stressed the  importance of nutrition with wound healing and the need to consume adequate amounts of protein / micronutrients. Pt likes the ensure she has been receiving. Encouraged consuming all 3 and mixing with ice cream to obtain adequate kcal. Will also add on juven BID. Recommended a MVI, pt reports she is having trouble with all of the large pills she is being given. Will add a chewable MVI. ? ?Nutritionally Relevant Medications: ?Scheduled Meds: ? Ensure Enlive  237 mL Oral TID WC  ? insulin aspart  0-9 Units Subcutaneous Q4H  ? potassium chloride  20 mEq Oral BID  ? ?Continuous Infusions: ? piperacillin-tazobactam (ZOSYN)  IV 3.375 g (11/13/21 1324)  ? ?Labs Reviewed: ?Sodium 133 ?CBG ranges from 105-222 mg/dL over the last 24 hours ?HgbA1c 6.6% (3/29) ? ?NUTRITION - FOCUSED PHYSICAL EXAM: ?Flowsheet Row Most Recent Value  ?Orbital Region No depletion  ?Upper Arm Region No depletion  ?Thoracic and Lumbar Region No depletion  ?Buccal Region No depletion  ?Temple Region No depletion  ?Clavicle Bone Region No depletion  ?Clavicle and Acromion Bone Region No depletion  ?Scapular Bone Region No depletion  ?Dorsal Hand No depletion  ?Patellar Region No depletion  ?Anterior Thigh Region No depletion  ?Posterior Calf Region No depletion  ?Edema (RD Assessment) None  ?Hair Reviewed  ?Eyes Reviewed  ?Mouth Reviewed  ?Skin Reviewed  ?Nails Reviewed  ? ? ?Diet Order:   ?Diet Order   ? ?       ?  DIET SOFT Room service appropriate? Yes; Fluid consistency: Thin  Diet effective now       ?  ? ?  ?  ? ?  ? ? ?  EDUCATION NEEDS:  ?Education needs have been addressed ? ?Skin:  Skin Assessment: Reviewed RN Assessment (surgical incision (midline) with wound vac in place, new colostomy) ? ?Last BM:  4/4 ? ?Height:  ?Ht Readings from Last 1 Encounters:  ?11/05/21 5' (1.524 m)  ? ? ?Weight:  ?Wt Readings from Last 1 Encounters:  ?11/08/21 100.4 kg  ? ? ?Ideal Body Weight:  45.4 kg ? ?BMI:  Body mass index is 43.23 kg/m?. ? ?Estimated Nutritional  Needs:  ?Kcal:  1900 - 2100 ?Protein:  95 - 110 grams ?Fluid:  >/= 1.9 L ? ? ? ?Ranell Patrick, RD, LDN ?Clinical Dietitian ?RD pager # available in [place]  ?After hours/weekend pager # available in [place] ?

## 2021-11-13 NOTE — Progress Notes (Signed)
Mobility Specialist Progress Note: ? ? 11/13/21 1307  ?Mobility  ?Activity Transferred from chair to bed  ?Level of Assistance Independent after set-up  ?Assistive Device None  ?Distance Ambulated (ft) 40 ft  ?Activity Response Tolerated fair  ?$Mobility charge 1 Mobility  ? ?Pt received in chair needing to go to bed for x-ray. Pt ambulated to BR and then to bed. Left in bed with call bell in reach and all needs met.  ? ?Michele [name] ?Mobility Specialist ?Primary Phone [phone] ? ?

## 2021-11-14 ENCOUNTER — Encounter (HOSPITAL_COMMUNITY): Admission: EM | Disposition: A | Discharge status: Home Health | Payer: Self-pay | Source: Ambulatory Visit

## 2021-11-14 ENCOUNTER — Encounter (HOSPITAL_COMMUNITY): Payer: Self-pay | Admitting: Obstetrics & Gynecology

## 2021-11-14 ENCOUNTER — Other Ambulatory Visit: Payer: Self-pay

## 2021-11-14 ENCOUNTER — Inpatient Hospital Stay (HOSPITAL_COMMUNITY): Payer: BC Managed Care – PPO | Admitting: General Practice

## 2021-11-14 HISTORY — PX: LAPAROTOMY: SHX154

## 2021-11-14 LAB — BASIC METABOLIC PANEL
Anion gap: 8 (ref 5–15)
BUN: 14 mg/dL (ref 6–20)
CO2: 27 mmol/L (ref 22–32)
Calcium: 8 mg/dL — ABNORMAL LOW (ref 8.9–10.3)
Chloride: 99 mmol/L (ref 98–111)
Creatinine, Ser: 0.75 mg/dL (ref 0.44–1.00)
GFR, Estimated: >60 mL/min (ref >60)
Glucose, Bld: 167 mg/dL — ABNORMAL HIGH (ref 70–99)
Potassium: 4.3 mmol/L (ref 3.5–5.1)
Sodium: 134 mmol/L — ABNORMAL LOW (ref 135–145)

## 2021-11-14 LAB — AEROBIC/ANAEROBIC CULTURE W GRAM STAIN (SURGICAL/DEEP WOUND)

## 2021-11-14 LAB — CBC
HCT: 26.6 % — ABNORMAL LOW (ref 36.0–46.0)
Hemoglobin: 8.1 g/dL — ABNORMAL LOW (ref 12.0–15.0)
MCH: 25.2 pg — ABNORMAL LOW (ref 26.0–34.0)
MCHC: 30.5 g/dL (ref 30.0–36.0)
MCV: 82.9 fL (ref 80.0–100.0)
Platelets: 789 10*3/uL — ABNORMAL HIGH (ref 150–400)
RBC: 3.21 MIL/uL — ABNORMAL LOW (ref 3.87–5.11)
RDW: 16.1 % — ABNORMAL HIGH (ref 11.5–15.5)
WBC: 16.5 10*3/uL — ABNORMAL HIGH (ref 4.0–10.5)
nRBC: 0 % (ref 0.0–0.2)

## 2021-11-14 LAB — TYPE AND SCREEN
ABO/RH(D): A NEG
Antibody Screen: NEGATIVE

## 2021-11-14 LAB — GLUCOSE, CAPILLARY
Glucose-Capillary: 155 mg/dL — ABNORMAL HIGH (ref 70–99)
Glucose-Capillary: 149 mg/dL — ABNORMAL HIGH (ref 70–99)
Glucose-Capillary: 190 mg/dL — ABNORMAL HIGH (ref 70–99)
Glucose-Capillary: 164 mg/dL — ABNORMAL HIGH (ref 70–99)

## 2021-11-14 SURGERY — LAPAROTOMY, EXPLORATORY
Anesthesia: General | Site: Abdomen

## 2021-11-14 MED ORDER — SUCCINYLCHOLINE CHLORIDE 200 MG/10ML IV SOSY
PREFILLED_SYRINGE | INTRAVENOUS | Status: DC | PRN
  Administered 2021-11-14: 120 mg via INTRAVENOUS

## 2021-11-14 MED ORDER — PROPOFOL 10 MG/ML IV BOLUS
INTRAVENOUS | Status: DC | PRN
  Administered 2021-11-14: 100 mg via INTRAVENOUS

## 2021-11-14 MED ORDER — MIDAZOLAM HCL 2 MG/2ML IJ SOLN
INTRAMUSCULAR | Status: DC | PRN
  Administered 2021-11-14: 2 mg via INTRAVENOUS

## 2021-11-14 MED ORDER — DEXAMETHASONE SODIUM PHOSPHATE 10 MG/ML IJ SOLN
INTRAMUSCULAR | Status: DC | PRN
  Administered 2021-11-14: 5 mg via INTRAVENOUS

## 2021-11-14 MED ORDER — MIDAZOLAM HCL 2 MG/2ML IJ SOLN
INTRAMUSCULAR | Status: AC
  Filled 2021-11-14: qty 2

## 2021-11-14 MED ORDER — ACETAMINOPHEN 10 MG/ML IV SOLN
INTRAVENOUS | Status: AC
Start: 2021-11-14 — End: 2021-11-15
  Filled 2021-11-14: qty 100

## 2021-11-14 MED ORDER — CHLORHEXIDINE GLUCONATE 0.12 % MT SOLN
OROMUCOSAL | Status: AC
  Filled 2021-11-14: qty 15

## 2021-11-14 MED ORDER — FENTANYL CITRATE (PF) 250 MCG/5ML IJ SOLN
INTRAMUSCULAR | Status: AC
  Filled 2021-11-14: qty 5

## 2021-11-14 MED ORDER — CHLORHEXIDINE GLUCONATE 0.12 % MT SOLN
15.0000 mL | Freq: Once | OROMUCOSAL | Status: AC
  Administered 2021-11-14: 15 mL via OROMUCOSAL

## 2021-11-14 MED ORDER — LACTATED RINGERS IV SOLN: INTRAVENOUS | Status: DC

## 2021-11-14 MED ORDER — FENTANYL CITRATE (PF) 100 MCG/2ML IJ SOLN
INTRAMUSCULAR | Status: AC
  Filled 2021-11-14: qty 2

## 2021-11-14 MED ORDER — FENTANYL CITRATE (PF) 100 MCG/2ML IJ SOLN
25.0000 ug | INTRAMUSCULAR | Status: DC | PRN
  Administered 2021-11-14 (×3): 50 ug via INTRAVENOUS

## 2021-11-14 MED ORDER — FENTANYL CITRATE (PF) 250 MCG/5ML IJ SOLN
INTRAMUSCULAR | Status: DC | PRN
  Administered 2021-11-14 (×3): 50 ug via INTRAVENOUS

## 2021-11-14 MED ORDER — PHENYLEPHRINE HCL-NACL 20-0.9 MG/250ML-% IV SOLN
INTRAVENOUS | Status: DC | PRN
  Administered 2021-11-14: 30 ug/min via INTRAVENOUS

## 2021-11-14 MED ORDER — LIDOCAINE 2% (20 MG/ML) 5 ML SYRINGE
INTRAMUSCULAR | Status: DC | PRN
  Administered 2021-11-14: 40 mg via INTRAVENOUS

## 2021-11-14 MED ORDER — COMPLETENATE 29-1 MG PO CHEW
1.0000 | CHEWABLE_TABLET | Freq: Every day | ORAL | Status: DC
  Administered 2021-11-15 – 2021-11-29 (×14): 1 via ORAL
  Filled 2021-11-14 (×16): qty 1

## 2021-11-14 MED ORDER — ROCURONIUM BROMIDE 10 MG/ML (PF) SYRINGE
PREFILLED_SYRINGE | INTRAVENOUS | Status: DC | PRN
  Administered 2021-11-14: 60 mg via INTRAVENOUS

## 2021-11-14 MED ORDER — HYDROMORPHONE HCL 1 MG/ML IJ SOLN
0.5000 mg | INTRAMUSCULAR | Status: AC
Start: 2021-11-14 — End: 2021-11-14
  Administered 2021-11-14: 0.5 mg via INTRAVENOUS

## 2021-11-14 MED ORDER — ONDANSETRON HCL 4 MG/2ML IJ SOLN
INTRAMUSCULAR | Status: DC | PRN
  Administered 2021-11-14: 4 mg via INTRAVENOUS

## 2021-11-14 MED ORDER — HYDROMORPHONE HCL 1 MG/ML IJ SOLN
1.0000 mg | INTRAMUSCULAR | Status: DC | PRN
  Administered 2021-11-14 – 2021-11-16 (×6): 1 mg via INTRAVENOUS
  Filled 2021-11-14 (×8): qty 1

## 2021-11-14 MED ORDER — 0.9 % SODIUM CHLORIDE (POUR BTL) OPTIME
TOPICAL | Status: DC | PRN
  Administered 2021-11-14 (×4): 1000 mL

## 2021-11-14 MED ORDER — SUGAMMADEX SODIUM 200 MG/2ML IV SOLN
INTRAVENOUS | Status: DC | PRN
  Administered 2021-11-14: 200 mg via INTRAVENOUS

## 2021-11-14 MED ORDER — ORAL CARE MOUTH RINSE: 15.0000 mL | Freq: Once | OROMUCOSAL | Status: AC

## 2021-11-14 MED ORDER — PHENYLEPHRINE 40 MCG/ML (10ML) SYRINGE FOR IV PUSH (FOR BLOOD PRESSURE SUPPORT)
PREFILLED_SYRINGE | INTRAVENOUS | Status: DC | PRN
  Administered 2021-11-14 (×2): 120 ug via INTRAVENOUS

## 2021-11-14 MED ORDER — ACETAMINOPHEN 10 MG/ML IV SOLN
1000.0000 mg | Freq: Once | INTRAVENOUS | Status: DC | PRN
  Administered 2021-11-14: 1000 mg via INTRAVENOUS

## 2021-11-14 SURGICAL SUPPLY — 50 items
APL PRP STRL LF DISP 70% ISPRP (MISCELLANEOUS)
BAG COUNTER SPONGE SURGICOUNT (BAG) ×2 IMPLANT
BAG SPNG CNTER NS LX DISP (BAG) ×1
BIOPATCH RED 1 DISK 7.0 (GAUZE/BANDAGES/DRESSINGS) ×1 IMPLANT
BLADE CLIPPER SURG (BLADE) IMPLANT
BNDG GAUZE ELAST 4 BULKY (GAUZE/BANDAGES/DRESSINGS) ×1 IMPLANT
CANISTER SUCT 3000ML PPV (MISCELLANEOUS) ×2 IMPLANT
CHLORAPREP W/TINT 26 (MISCELLANEOUS) ×1 IMPLANT
COVER SURGICAL LIGHT HANDLE (MISCELLANEOUS) ×2 IMPLANT
DRAPE LAPAROSCOPIC ABDOMINAL (DRAPES) ×2 IMPLANT
DRAPE WARM FLUID 44X44 (DRAPES) ×2 IMPLANT
DRSG OPSITE POSTOP 4X10 (GAUZE/BANDAGES/DRESSINGS) IMPLANT
DRSG OPSITE POSTOP 4X8 (GAUZE/BANDAGES/DRESSINGS) IMPLANT
DRSG TEGADERM 4X4.75 (GAUZE/BANDAGES/DRESSINGS) ×1 IMPLANT
ELECT BLADE 6.5 EXT (BLADE) IMPLANT
ELECT CAUTERY BLADE 6.4 (BLADE) ×2 IMPLANT
ELECT REM PT RETURN 9FT ADLT (ELECTROSURGICAL) ×2
ELECTRODE REM PT RTRN 9FT ADLT (ELECTROSURGICAL) ×1 IMPLANT
GLOVE SRG 8 PF TXTR STRL LF DI (GLOVE) ×1 IMPLANT
GLOVE SURG ENC MOIS LTX SZ8 (GLOVE) ×2 IMPLANT
GLOVE SURG UNDER POLY LF SZ8 (GLOVE) ×2
GOWN STRL REUS W/ TWL LRG LVL3 (GOWN DISPOSABLE) ×1 IMPLANT
GOWN STRL REUS W/ TWL XL LVL3 (GOWN DISPOSABLE) ×1 IMPLANT
GOWN STRL REUS W/TWL LRG LVL3 (GOWN DISPOSABLE) ×2
GOWN STRL REUS W/TWL XL LVL3 (GOWN DISPOSABLE) ×2
HANDLE SUCTION POOLE (INSTRUMENTS) ×1 IMPLANT
KIT BASIN OR (CUSTOM PROCEDURE TRAY) ×2 IMPLANT
KIT OSTOMY DRAINABLE 2.75 STR (WOUND CARE) ×1 IMPLANT
KIT TURNOVER KIT B (KITS) ×2 IMPLANT
LIGASURE IMPACT 36 18CM CVD LR (INSTRUMENTS) IMPLANT
NS IRRIG 1000ML POUR BTL (IV SOLUTION) ×6 IMPLANT
PACK GENERAL/GYN (CUSTOM PROCEDURE TRAY) ×2 IMPLANT
PAD ABD 8X10 STRL (GAUZE/BANDAGES/DRESSINGS) ×2 IMPLANT
PAD ARMBOARD 7.5X6 YLW CONV (MISCELLANEOUS) ×2 IMPLANT
PENCIL SMOKE EVACUATOR (MISCELLANEOUS) ×1 IMPLANT
SPECIMEN JAR LARGE (MISCELLANEOUS) IMPLANT
SPONGE T-LAP 18X18 ~~LOC~~+RFID (SPONGE) IMPLANT
STAPLER VISISTAT 35W (STAPLE) ×2 IMPLANT
SUCTION POOLE HANDLE (INSTRUMENTS) ×2
SUT ETHIBOND 5 LR DA (SUTURE) ×7 IMPLANT
SUT PDS AB 1 TP1 96 (SUTURE) ×4 IMPLANT
SUT VIC AB 2-0 SH 18 (SUTURE) ×2 IMPLANT
SUT VIC AB 3-0 SH 18 (SUTURE) ×2 IMPLANT
SUT VICRYL AB 2 0 TIES (SUTURE) ×1 IMPLANT
SUT VICRYL AB 3 0 TIES (SUTURE) ×1 IMPLANT
SWAB CULTURE ESWAB REG 1ML (MISCELLANEOUS) ×1 IMPLANT
SWAB CULTURE LIQ STUART DBL (MISCELLANEOUS) ×1 IMPLANT
TOWEL GREEN STERILE (TOWEL DISPOSABLE) ×2 IMPLANT
TOWEL GREEN STERILE FF (TOWEL DISPOSABLE) ×2 IMPLANT
YANKAUER SUCT BULB TIP NO VENT (SUCTIONS) IMPLANT

## 2021-11-14 NOTE — Anesthesia Preprocedure Evaluation (Addendum)
Anesthesia Evaluation  ?Patient identified by MRN, date of birth, ID band ?Patient awake ? ? ? ?Reviewed: ?Allergy & Precautions, NPO status , Patient's Chart, lab work & pertinent test results ? ?Airway ?Mallampati: II ? ?TM Distance: >3 FB ?Neck ROM: Full ? ? ? Dental ?no notable dental hx. ? ?  ?Pulmonary ?neg pulmonary ROS, former smoker,  ?  ?Pulmonary exam normal ? ? ? ? ? ? ? Cardiovascular ?hypertension,  ?Rhythm:Regular Rate:Normal ? ? ?  ?Neuro/Psych ?  ? GI/Hepatic ?Neg liver ROS, S/p ex lap 3/28 with dehiscence here for closure originally TAH 10/26/21 ?  ?Endo/Other  ?negative endocrine ROS ? Renal/GU ?negative Renal ROS  ? ?  ?Musculoskeletal ?negative musculoskeletal ROS ?(+)  ? Abdominal ?(+)  ?Abdomen: tender. ? ?  ?Peds ? Hematology ? ?(+) Blood dyscrasia, anemia ,   ?Anesthesia Other Findings ? ? Reproductive/Obstetrics ? ?  ? ? ? ? ? ? ? ? ? ? ? ? ? ?  ?  ? ? ? ? ? ? ? ?Anesthesia Physical ?Anesthesia Plan ? ?ASA: 3 and emergent ? ?Anesthesia Plan: General  ? ?Post-op Pain Management:   ? ?Induction: Intravenous and Rapid sequence ? ?PONV Risk Score and Plan: 3 and Ondansetron, Dexamethasone and Midazolam ? ?Airway Management Planned: Mask and Oral ETT ? ?Additional Equipment: None ? ?Intra-op Plan:  ? ?Post-operative Plan: Extubation in OR ? ?Informed Consent: I have reviewed the patients History and Physical, chart, labs and discussed the procedure including the risks, benefits and alternatives for the proposed anesthesia with the patient or authorized representative who has indicated his/her understanding and acceptance.  ? ? ? ?Dental advisory given ? ?Plan Discussed with: CRNA ? ?Anesthesia Plan Comments: (Lab Results ?     Component                Value               Date                 ?     WBC                      16.5 (H)            11/14/2021           ?     HGB                      8.1 (L)             11/14/2021           ?     HCT                       26.6 (L)            11/14/2021           ?     MCV                      82.9                11/14/2021           ?     PLT                      789 (H)             11/14/2021           ?  Lab Results ?     Component                Value               Date                 ?     NA                       134 (L)             11/14/2021           ?     K                        4.3                 11/14/2021           ?     CO2                      27                  11/14/2021           ?     GLUCOSE                  167 (H)             11/14/2021           ?     BUN                      14                  11/14/2021           ?     CREATININE               0.75                11/14/2021           ?     CALCIUM                  8.0 (L)             11/14/2021           ?     GFRNONAA                 >60                 11/14/2021          )  ? ? ? ? ? ?Anesthesia Quick Evaluation ? ?

## 2021-11-14 NOTE — Progress Notes (Signed)
Patient has eviscerated and will need emergent intervention for closure despite orange juice and ensure at 1000am. ? ?Michele [name] ?11:49 AM ?11/14/2021 ? ?

## 2021-11-14 NOTE — Progress Notes (Signed)
PT Cancellation Note ? ?Patient Details ?Name: Michele [name] ?MRN: [id number] ?DOB: [birth year] ? ? ?Cancelled Treatment:    Reason Eval/Treat Not Completed: Patient at procedure or test/unavailable. Will follow-up for PT treatment as schedule permits. ? ?Mabeline Caras, PT, DPT ?Acute Rehabilitation Services  ?Pager [phone] ?Office [phone] ? ?Derry Lory ?11/14/2021, 1:38 PM ? ? ?

## 2021-11-14 NOTE — Anesthesia Postprocedure Evaluation (Signed)
Anesthesia Post Note ? ?Patient: LAMYAH CREED ? ?Procedure(s) Performed: EXPLORATORY  LAPAROTOMY AND ABDOMINAL WALL CLOSURE (Abdomen) ? ?  ? ?Patient location during evaluation: PACU ?Anesthesia Type: General ?Level of consciousness: awake and alert, patient cooperative and oriented ?Pain management: pain level controlled (pt says shes very comfortable) ?Vital Signs Assessment: post-procedure vital signs reviewed and stable ?Respiratory status: spontaneous breathing, nonlabored ventilation, respiratory function stable and patient connected to nasal cannula oxygen ?Cardiovascular status: blood pressure returned to baseline and stable ?Postop Assessment: no apparent nausea or vomiting ?Anesthetic complications: no ? ? ?No notable events documented. ? ?Last Vitals:  ?Vitals:  ? 11/14/21 1505 11/14/21 1520  ?BP: 118/83 (!) 131/92  ?Pulse: (!) 103 (!) 105  ?Resp: 13 13  ?Temp:    ?SpO2: 94% 94%  ?  ?Last Pain:  ?Vitals:  ? 11/14/21 1505  ?TempSrc:   ?PainSc: 5   ? ? ?  ?  ?  ?  ?  ?  ? ?Tarra Pence,E. Jermain Curt ? ? ? ? ?

## 2021-11-14 NOTE — H&P (View-Only) (Signed)
Patient has eviscerated and will need emergent intervention for closure despite orange juice and ensure at 1000am. ? ?Michele [name] ?11:49 AM ?11/14/2021 ? ?

## 2021-11-14 NOTE — Progress Notes (Signed)
Mobility Specialist Progress Note: ? ? 11/14/21 1231  ?Mobility  ?Activity Off unit  ?$Mobility charge 1 Mobility  ? ?Will follow-up as time allows.  ? ?Dallen Bunte ?Mobility Specialist ?Primary Phone [phone] ? ?

## 2021-11-14 NOTE — Op Note (Signed)
Preoperative diagnosis: History of exploratory laparotomy with fascial dehiscence ? ?Postoperative diagnosis: Same ? ?Procedure: Exploratory laparotomy with abdominal cavity washout and drainage of intra-abdominal abscess with retention suture closure en mass of abdominal wall ? ?Surgeon: Erroll Luna, MD ? ?Anesthesia: General ? ?EBL: 40 cc ? ?Specimen: Cultures of abscess taken ? ?Drains: None ? ?Indications for procedure: The patient is a pleasant 56 year old female with a history of robotic hysterectomy.  She was readmitted in the postoperative window with what appeared to be diverticulitis colonic perforation.  She underwent emergent laparotomy with sigmoid colectomy and colostomy for this.  Her postop course has been somewhat slow to progress.  She has had progressive fascial breakdown and today had full-blown fascial dehiscence of her necrotic fascia.  She was examined and felt that return to the operating room for washout and closure necessary.The procedure has been discussed with the patient.  Alternative therapies have been discussed with the patient.  Operative risks include bleeding,  Infection,  Organ injury,  Nerve injury,  Blood vessel injury,  DVT,  Pulmonary embolism,  Death,  And possible reoperation.  Medical management risks include worsening of present situation.  The success of the procedure is 50 -90 % at treating patients symptoms.  The patient understands and agrees to proceed.  ? ? ?Description of procedure: The patient was met in the holding area and the procedure was reviewed.  All questions were answered.  She was then taken back to the operating.  She was placed upon upon the OR table.  After induction of general esthesia her dressing was removed from her midline.  She had complete dehiscence of her fascia.  She also had significant necrosis of the fascia.  We prepped and draped her abdomen in sterile fashion.  A timeout was performed.  Any remaining suture of PDS were removed.  Of  note most of her abdomen was frozen.  I carefully palpated into an abscess and a large amount of copious foul-smelling pus was encountered and washed out with irrigation.  I then irrigated the abdominal cavity aggressively but her bowel was mostly matted together and was not in a condition to separate.  Most of the pus encounter was in the right upper outer quadrant near [facility] drain.  Given the amount of fibrosis and what appear to be a frozen abdomen, I felt that closure was only septic could be undertaken.  The fascia was extremely poor quality.  Any suture put into it tore.  I opted to put large transabdominal bites of 0 Ethibond sutures.  6 of these large retention sutures were placed.  I closed them sequentially making sure there is no impingement on the underlying bowel.  Care was taken to avoid a colostomy.  Red rubber catheters were used as bumpers.  All sutures were tied down with closure of the abdominal wall.  All counts were then found to be correct.  Kerlix was packed into the wound.  Ostomy appliance was replaced.  Dry dressings applied.  All counts found to be correct.  The patient was awoke extubated taken to recovery in satisfactory condition. ?

## 2021-11-14 NOTE — Anesthesia Procedure Notes (Signed)
Procedure Name: Intubation ?Date/Time: 11/14/2021 1:20 PM ?Performed by: Amadeo Garnet, CRNA ?Pre-anesthesia Checklist: Patient identified, Emergency Drugs available, Suction available and Patient being monitored ?Patient Re-evaluated:Patient Re-evaluated prior to induction ?Oxygen Delivery Method: Circle system utilized ?Preoxygenation: Pre-oxygenation with 100% oxygen ?Induction Type: IV induction, Rapid sequence and Cricoid Pressure applied ?Laryngoscope Size: Mac and 3 ?Grade View: Grade I ?Tube type: Oral ?Tube size: 7.0 mm ?Number of attempts: 1 ?Airway Equipment and Method: Stylet and Oral airway ?Placement Confirmation: ETT inserted through vocal cords under direct vision, positive ETCO2 and breath sounds checked- equal and bilateral ?Secured at: 22 cm ?Tube secured with: Tape ?Dental Injury: Teeth and Oropharynx as per pre-operative assessment  ? ? ? ? ?

## 2021-11-14 NOTE — Transfer of Care (Signed)
Immediate Anesthesia Transfer of Care Note ? ?Patient: Michele [name] ? ?Procedure(s) Performed: EXPLORATORY  LAPAROTOMY AND ABDOMINAL WALL CLOSURE (Abdomen) ? ?Patient Location: PACU ? ?Anesthesia Type:General ? ?Level of Consciousness: awake, alert  and oriented ? ?Airway & Oxygen Therapy: Patient Spontanous Breathing and Patient connected to face mask oxygen ? ?Post-op Assessment: Report given to RN, Post -op Vital signs reviewed and stable and Patient moving all extremities ? ?Post vital signs: Reviewed and stable ? ?Last Vitals:  ?Vitals Value Taken Time  ?BP 134/85 11/14/21 1436  ?Temp    ?Pulse 113 11/14/21 1440  ?Resp 22 11/14/21 1440  ?SpO2 91 % 11/14/21 1440  ?Vitals shown include unvalidated device data. ? ?Last Pain:  ?Vitals:  ? 11/14/21 1241  ?TempSrc: Oral  ?PainSc: 0-No pain  ?   ? ?Patients Stated Pain Goal: 2 (11/14/21 7289) ? ?Complications: No notable events documented. ?

## 2021-11-14 NOTE — Interval H&P Note (Signed)
History and Physical Interval Note: ? ?11/14/2021 ?12:58 PM ? ?Michele [name]  has presented today for surgery, with the diagnosis of abdominal dehiscence.  The various methods of treatment have been discussed with the patient and family. After consideration of risks, benefits and other options for treatment, the patient has consented to  Procedure(s): ?EXPLORATORY  LAPAROTOMY AND ABDOMINAL WALL CLOSURE (N/A) as a surgical intervention.  The patient's history has been reviewed, patient examined, no change in status, stable for surgery.  I have reviewed the patient's chart and labs.  Questions were answered to the patient's satisfaction.   ? ? ?Alexie Lanni A Cleve Paolillo ? ? ?

## 2021-11-14 NOTE — Progress Notes (Addendum)
9 Days Post-Op  ? ?Subjective/Chief Complaint: ?Feels worse today.  ? ?Objective: ?Vital signs in last 24 hours: ?Temp:  [97.8 ?F (36.6 ?C)-98.8 ?F (37.1 ?C)] 98.8 ?F (37.1 ?C) (04/06 0750) ?Pulse Rate:  [98-113] 108 (04/06 0750) ?Resp:  [17-18] 18 (04/06 0750) ?BP: (104-134)/(67-85) 118/67 (04/06 0750) ?SpO2:  [93 %-100 %] 95 % (04/06 0750) ?Last BM Date : 11/12/21 ? ?Intake/Output from previous day: ?04/05 0701 - 04/06 0700 ?In: 1410.1 [P.O.:750; I.V.:225; IV Piggyback:430.1] ?Out: -  ?Intake/Output this shift: ?No intake/output data recorded. ? ?General appearance: appears uncomfortable, looks ill  ?HR: sinus tachycardia ?Pulm: splinting breathing, O2 sats 90% ORA  ?Abd: midline dressing with tan drainage, removed. fascia has further separated to about 3.5 cm. there is cloudy SS drainage from wound, visible and palpable small bowel under sutures.  ? ?Lab Results:  ?Recent Labs  ?  11/13/21 ?0150 11/14/21 ?0038  ?WBC 16.0* 16.5*  ?HGB 8.6* 8.1*  ?HCT 27.1* 26.6*  ?PLT 712* 789*  ? ?BMET ?Recent Labs  ?  11/13/21 ?0150 11/14/21 ?0038  ?NA 133* 134*  ?K 3.7 4.3  ?CL 100 99  ?CO2 26 27  ?GLUCOSE 188* 167*  ?BUN 9 14  ?CREATININE 0.70 0.75  ?CALCIUM 7.6* 8.0*  ? ?Studies/Results: ?DG Abd Portable 1V ? ?Result Date: 11/13/2021 ?CLINICAL DATA:  Persistent abdominal pain pain. History of bowel perforation. EXAM: PORTABLE ABDOMEN - 1 VIEW COMPARISON:  11/10/2021 FINDINGS: Drain in place in the right upper quadrant. No sign of small bowel obstruction or colon obstruction. Left lower abdominal colostomy. Gas within the soft tissues of the abdominal wall is again demonstrated. IMPRESSION: Right upper quadrant region drain. Air within the soft tissues of the abdominal wall as seen on the previous CT. Electronically Signed   By: Nelson Chimes M.D.   On: 11/13/2021 13:09   ? ?Anti-infectives: ?Anti-infectives (From admission, onward)  ? ? Start     Dose/Rate Route Frequency Ordered Stop  ? 11/05/21 2100  piperacillin-tazobactam  (ZOSYN) IVPB 3.375 g       ? 3.375 g ?12.5 mL/hr over 240 Minutes Intravenous Every 8 hours 11/05/21 2010    ? 11/05/21 1500  piperacillin-tazobactam (ZOSYN) IVPB 3.375 g       ? 3.375 g ?100 mL/hr over 30 Minutes Intravenous  Once 11/05/21 1451 11/05/21 1600  ? ?  ? ? ?Assessment/Plan: ?POD 8, s/p ex lap with Hartmann's procedure for perforated diverticulitis by Dr. Grandville Silos 3/28 ?- afebrile,WBC 23 > 20 > 16 > 16 ?- CT A/P 4/2 w/ multiple intra-abd fluid collections, two large collections in upper abd.  ?- s/p IR drain RUQ 4/3, serous, follow Cx ? ?Now with worsening fascial dehiscence, impending evisceration - recommend proceeding to OR for ex lap, washout, fascial closure.  ?May need TPN post op  ? ? LOS: 9 days  ? ? ?[place] ?11/14/2021 ? ?

## 2021-11-15 ENCOUNTER — Encounter (HOSPITAL_COMMUNITY): Payer: Self-pay | Admitting: Surgery

## 2021-11-15 LAB — GLUCOSE, CAPILLARY
Glucose-Capillary: 119 mg/dL — ABNORMAL HIGH (ref 70–99)
Glucose-Capillary: 125 mg/dL — ABNORMAL HIGH (ref 70–99)
Glucose-Capillary: 136 mg/dL — ABNORMAL HIGH (ref 70–99)
Glucose-Capillary: 146 mg/dL — ABNORMAL HIGH (ref 70–99)
Glucose-Capillary: 187 mg/dL — ABNORMAL HIGH (ref 70–99)
Glucose-Capillary: 192 mg/dL — ABNORMAL HIGH (ref 70–99)
Glucose-Capillary: 219 mg/dL — ABNORMAL HIGH (ref 70–99)

## 2021-11-15 NOTE — Progress Notes (Signed)
Physical Therapy Treatment and D/C ?Patient Details ?Name: Michele [name] ?MRN: [id number] ?DOB: Dec 14, 1965 ?Today's Date: 11/15/2021 ? ? ?History of Present Illness Pt is a 56 y.o. admitted from GYN office on 11/05/21 with SOB and abdominal wall redness 10-days s/p open hysterectomy (10/25/21). Abdominal CT showed significant pneumoperitoneum with possible bowel perforation. S/p ex lap, sigmoid colectomy, and colostomy on 3/28. S/p RUQ subhepatic drain placement 4/3. Pt with wound dehiscence therefore underwent Exploratory laparotomy with abdominal cavity washout and drainage of intra-abdominal abscess with retention suture closure en mass of abdominal wall on 4/6. PMH includes anxiety, anemia, HTN. ? ?  ?PT Comments  ? ? Pt admitted with above diagnosis. Pt declines ambulation and nurse agrees as pt is waiting for someone to cut her abdominal binder to fit around the ostomy.  Sister and pt report that pt has been up and walking to bathroom multiple times a day and did ambulate in hallway with sister in the evening on WEd.  Pt states she needs to hold onto something therefore set a RW up for pt and she can use it.  Pt performed some exercises and encouraged pt to continue to do exercises throughout day and pt agrees. Pt and sister educated in how to put bed in chair position as pt c/o the recliner being uncomfortable. Pt likes the chair position in bed.  Given that pt mobilizing with sister and doesn't need physical assist, will sign off.  Sister feels comfortable assisting pt at home prn.  They declined gait belt.  CM, may need RW and pt to decide.  Goals met. Sign off.    ?Recommendations for follow up therapy are one component of a multi-disciplinary discharge planning process, led by the attending physician.  Recommendations may be updated based on patient status, additional functional criteria and insurance authorization. ? ?Follow Up Recommendations ? No PT follow up ?  ?  ?Assistance Recommended at Discharge  Intermittent Supervision/Assistance  ?Patient can return home with the following A little help with bathing/dressing/bathroom;Assistance with cooking/housework ?  ?Equipment Recommendations ? Rolling walker (2 wheels) (Pt to decide if she wants a RW)  ?  ?Recommendations for Other Services   ? ? ?  ?Precautions / Restrictions Precautions ?Precautions: Fall;Other (comment) ?Precaution Comments: abdominal precautions for comfort, colostomy, RUQ drain ?Restrictions ?Weight Bearing Restrictions: No  ?  ? ?Mobility ? Bed Mobility ?Overal bed mobility: Needs Assistance ?Bed Mobility: Rolling, Sidelying to Sit, Sit to Sidelying, Sit to Supine ?Rolling: Modified independent (Device/Increase time) ?  ?  ?Sit to supine: Modified independent (Device/Increase time) ?Sit to sidelying: Modified independent (Device/Increase time) ?General bed mobility comments: received sitting on EOB.  Pt waititng for nursing to fix her abdominal binder and nurse asked PT not to walk with pt.  Per pt and sister, pt has been walking to and from bathroom and in hallways with sister in evenings.  Pt reports she has held onto the pole and PT recommended to use RW for support and then decide if she wants to use one at home.  Set walker up for pt height. Pt and sister appreciative.  Pt performed some LE exercises and was beginning to get tired of sitting EOB therefore pt able to lie down without assist.  Scooted pt up in bed with total assist and then placed bed in chair position. Pt appreciative as she was not comfortable in chair due to her height.  Sister aware of how to place bed in chair position. ?  ? ?Transfers ?  ?  ?  ?  ?  ?  ?  ?  ?  ?  ?  ? ?  Ambulation/Gait ?  ?  ?  ?  ?  ?  ?  ?  ? ? ?Stairs ?  ?  ?  ?  ?  ? ? ?Wheelchair Mobility ?  ? ?Modified Rankin (Stroke Patients Only) ?  ? ? ?  ?Balance Overall balance assessment: Needs assistance ?Sitting-balance support: No upper extremity supported, Feet supported ?Sitting balance-Leahy Scale:  Good ?  ?  ?  ?  ?  ?  ?  ?  ?  ?  ?  ?  ?  ?  ?  ?  ?  ? ?  ?Cognition Arousal/Alertness: Awake/alert ?Behavior During Therapy: [facility] for tasks assessed/performed, Anxious ?Overall Cognitive Status: Within Functional Limits for tasks assessed ?Area of Impairment: Safety/judgement ?  ?  ?  ?  ?  ?  ?  ?  ?  ?  ?  ?  ?  ?  ?  ?General Comments: WFL for simple tasks, not formally assessed. ?  ?  ? ?  ?Exercises General Exercises - Lower Extremity ?Ankle Circles/Pumps: AROM, Both, 20 reps, Supine ?Long Arc Quad: AROM, Both, 10 reps, Seated ?Heel Slides: AROM, Both, 10 reps, Supine ? ?  ?General Comments   ?  ?  ? ?Pertinent Vitals/Pain Pain Assessment ?Pain Assessment: Faces ?Faces Pain Scale: Hurts little more ?Pain Location: abdomen, lower back ?Pain Descriptors / Indicators: Sore, Discomfort ?Pain Intervention(s): Limited activity within patient's tolerance, Monitored during session, Repositioned  ? ? ?Home Living   ?  ?  ?  ?  ?  ?  ?  ?  ?  ?   ?  ?Prior Function    ?  ?  ?   ? ?PT Goals (current goals can now be found in the care plan section) Acute Rehab PT Goals ?Patient Stated Goal: home, feel better ?PT Goal Formulation: All assessment and education complete, DC therapy ?Progress towards PT goals: Goals met/education completed, patient discharged from PT ? ?  ?Frequency ? ? ? Min 3X/week ? ? ? ?  ?PT Plan Current plan remains appropriate  ? ? ?Co-evaluation   ?  ?  ?  ?  ? ?  ?AM-PAC PT "6 Clicks" Mobility   ?Outcome Measure ? Help needed turning from your back to your side while in a flat bed without using bedrails?: None ?Help needed moving from lying on your back to sitting on the side of a flat bed without using bedrails?: None ?Help needed moving to and from a bed to a chair (including a wheelchair)?: None ?Help needed standing up from a chair using your arms (e.g., wheelchair or bedside chair)?: None ?Help needed to walk in hospital room?: None ?Help needed climbing 3-5 steps with a railing? : None ?6  Click Score: 24 ? ?  ?End of Session Equipment Utilized During Treatment: Gait belt (Discussed but pt and sisster decline need for belt.) ?Activity Tolerance: Patient tolerated treatment well ?Patient left: with call bell/phone within reach;with family/visitor present;in bed;Other (comment) (bed in chair position) ?Nurse Communication: Mobility status ?PT Visit Diagnosis: Other abnormalities of gait and mobility (R26.89);Difficulty in walking, not elsewhere classified (R26.2);Pain ?Pain - Right/Left:  (mid) ?Pain - part of body:  (abdomen) ?  ? ? ?Time: 5809-9833 ?PT Time Calculation (min) (ACUTE ONLY): 28 min ? ?Charges:  $Gait Training: 8-22 mins ?$Self Care/Home Management: 8-22          ?          ? ?Kalvin Buss M,PT ?Acute Rehab Services ?  [phone] ?[phone] (pager)  ? ? ?Alvira Philips ?11/15/2021, 12:52 PM ? ?

## 2021-11-15 NOTE — Progress Notes (Signed)
? ? ?Supervising Physician: Jacqulynn Cadet ? ?Patient Status:  [facility] - In-pt ? ?Chief Complaint: ? ?Perforated diverticulitis complicated by intra-abdominal fluid collection, s/p RUQ dran placement by Dr. Serafina Royals on 12/01/2021 ? ?Subjective: ? ?Patient sitting upright in bed.  Sister at bedside. ?Reports feeling much better after yesterday's surgery. ? ?Allergies: ?Lipitor [atorvastatin] and Sulfa antibiotics ? ?Medications: ?Prior to Admission medications   ?Medication Sig Start Date End Date Taking? Authorizing Provider  ?ADDERALL XR 30 MG 24 hr capsule Take 30 mg by mouth daily. 09/23/21  Yes [provider]  ?citalopram (CELEXA) 20 MG tablet Take 20 mg by mouth daily.   Yes [provider]  ?CRESTOR 5 MG tablet TAKE 1 TABLET BY MOUTH EVERY DAY ?Patient taking differently: Take 5 mg by mouth daily. 04/17/15  Yes Dorothy Spark, MD  ?ibuprofen (ADVIL) 600 MG tablet Take 1 tablet (600 mg total) by mouth every 6 (six) hours as needed for cramping or moderate pain. 10/28/21  Yes Sanjuana Kava, MD  ?oxyCODONE-acetaminophen (PERCOCET/ROXICET) 5-325 MG tablet Take 1-2 tablets by mouth every 4 (four) hours as needed for severe pain or moderate pain. 10/28/21  Yes Sanjuana Kava, MD  ?simethicone (MYLICON) 941 MG chewable tablet Chew 125 mg by mouth every 6 (six) hours as needed for flatulence.   Yes [provider]  ?sodium chloride flush (NS) 0.9 % SOLN 5 mLs by Intracatheter route daily for 28 days. 11/12/21 12/10/21  Han, Aimee H, PA-C  ? ? ? ?Vital Signs: ?BP 123/84 (BP Location: Left Arm)   Pulse 77   Temp 97.9 ?F (36.6 ?C) (Oral)   Resp 19   Ht 5' (1.524 m)   Wt 221 lb 5.5 oz (100.4 kg)   LMP 10/07/2021 (Approximate)   SpO2 97%   BMI 43.23 kg/m?  ? ?Physical Exam ?Constitutional:   ?   General: She is not in acute distress. ?   Appearance: She is well-developed. She is not toxic-appearing.  ?HENT:  ?   Head: Normocephalic and atraumatic.  ?Pulmonary:  ?   Effort: Pulmonary effort is normal.   ?Abdominal:  ?   General: Bowel sounds are normal.  ?   Palpations: Abdomen is soft.  ?Skin: ?   General: Skin is warm and dry.  ?   Comments: RUQ drain:  scant clear OP in bulb, site is non-tender, and without erythema, bleeding, or discharge, suture and dressing are in place.     ?Neurological:  ?   General: No focal deficit present.  ?   Mental Status: She is alert.  ? ? ?Imaging: ?DG Abd Portable 1V ? ?Result Date: 11/13/2021 ?CLINICAL DATA:  Persistent abdominal pain pain. History of bowel perforation. EXAM: PORTABLE ABDOMEN - 1 VIEW COMPARISON:  11/10/2021 FINDINGS: Drain in place in the right upper quadrant. No sign of small bowel obstruction or colon obstruction. Left lower abdominal colostomy. Gas within the soft tissues of the abdominal wall is again demonstrated. IMPRESSION: Right upper quadrant region drain. Air within the soft tissues of the abdominal wall as seen on the previous CT. Electronically Signed   By: Nelson Chimes M.D.   On: 11/13/2021 13:09  ? ?CT IMAGE GUIDED DRAINAGE BY PERCUTANEOUS CATHETER ? ?Result Date: 11/11/2021 ?INDICATION: 56 year old female with history of perforated diverticulitis status post Hartmann's procedure with persistent intra-abdominal fluid collections, the largest in the subhepatic region. EXAM: CT IMAGE GUIDED DRAINAGE BY PERCUTANEOUS CATHETER COMPARISON:  11/05/2021, 11/10/2021 MEDICATIONS: The patient is currently admitted to the hospital and receiving  intravenous antibiotics. The antibiotics were administered within an appropriate time frame prior to the initiation of the procedure. ANESTHESIA/SEDATION: Moderate (conscious) sedation was employed during this procedure. A total of Versed 2 mg and Fentanyl 75 mcg was administered intravenously. Moderate Sedation Time: 15 minutes. The patient's level of consciousness and vital signs were monitored continuously by radiology nursing throughout the procedure under my direct supervision. CONTRAST:  None COMPLICATIONS: None  immediate. PROCEDURE: Informed written consent was obtained from the patient after a discussion of the risks, benefits and alternatives to treatment. The patient was placed supine on the CT gantry and a pre procedural CT was performed re-demonstrating the known abscess/fluid collection within the subhepatic fluid collection containing a few foci of gas. The procedure was planned. A timeout was performed prior to the initiation of the procedure. RADIATION DOSE REDUCTION: This exam was performed according to the departmental dose-optimization program which includes automated exposure control, adjustment of the mA and/or kV according to patient size and/or use of iterative reconstruction technique. The right upper quadrant was prepped and draped in the usual sterile fashion. The overlying soft tissues were anesthetized with 1% lidocaine with epinephrine. Appropriate trajectory was planned with the use of a 22 gauge spinal needle. An 18 gauge trocar needle was advanced into the abscess/fluid collection and a short Amplatz super stiff wire was coiled within the collection. Appropriate positioning was confirmed with a limited CT scan. The tract was serially dilated allowing placement of a 10 [place] all-purpose drainage catheter. Appropriate positioning was confirmed with a limited postprocedural CT scan. Approximately 80 ml of opaque, yellow fluid was aspirated. The tube was connected to a bulb suction and sutured in place. A dressing was placed. The patient tolerated the procedure well without immediate post procedural complication. IMPRESSION: Successful CT guided placement of a 10.2 [place] all purpose drain catheter into the subhepatic fluid collection with aspiration of 80 mL of opaque, yellow fluid. Samples were sent to the laboratory as requested by the ordering clinical team. Ruthann Cancer, MD [facility] Electronically Signed   By: Ruthann Cancer M.D.   On:  11/11/2021 13:59   ? ?Labs: ? ?CBC: ?Recent Labs  ?  11/11/21 ?0303 11/12/21 ?0103 11/13/21 ?0150 11/14/21 ?0038  ?WBC 20.9* 16.4* 16.0* 16.5*  ?HGB 9.6* 9.6* 8.6* 8.1*  ?HCT 29.7* 28.9* 27.1* 26.6*  ?PLT 662* 632* 712* 789*  ? ? ?COAGS: ?No results for input(s): INR, APTT in the last 8760 hours. ? ?BMP: ?Recent Labs  ?  11/12/21 ?0103 11/12/21 ?1511 11/13/21 ?0150 11/14/21 ?0038  ?NA 135 132* 133* 134*  ?K 2.8* 3.4* 3.7 4.3  ?CL 101 100 100 99  ?CO2 '25 24 26 27  '$ ?GLUCOSE 124* 222* 188* 167*  ?BUN <5* '7 9 14  '$ ?CALCIUM 7.5* 7.5* 7.6* 8.0*  ?CREATININE 0.56 0.71 0.70 0.75  ?GFRNONAA >60 >60 >60 >60  ? ? ?LIVER FUNCTION TESTS: ?Recent Labs  ?  11/05/21 ?1255  ?BILITOT 0.7  ?AST 32  ?ALT 22  ?ALKPHOS 138*  ?PROT 6.0*  ?ALBUMIN 1.9*  ? ? ?Assessment and Plan: ? ?Intra-abdominal fluid collection ?--Drain flushes and aspirates well, scant OP ?--If still no OP tomorrow, can consider imaging to determine timing of removal ? ?Continue TID flushes with 5 cc NS. ?Record output Q shift. ?Dressing changes QD or PRN if soiled.  ?Call IR APP or on call IR MD if difficulty flushing or sudden change in drain output.  ?Repeat imaging/possible drain injection once output <  10 mL/QD (excluding flush material.) ? ?Discharge planning: ?Please contact IR APP or on call IR MD prior to patient d/c to ensure appropriate follow up plans are in place. Typically patient will follow up with IR clinic 10-14 days post d/c for repeat imaging/possible drain injection. IR scheduler will contact patient with date/time of appointment. Patient will need to flush drain QD with 5 cc NS, record output QD, dressing changes every 2-3 days or earlier if soiled.  ? ?IR will continue to follow - please call with questions or concerns. ? ?  ? ?Electronically Signed: ?Pasty Spillers, PA ?11/15/2021, 1:31 PM ? ? ?I spent a total of 15 Minutes at the the patient's bedside AND on the patient's hospital floor or unit, greater than 50% of which was  counseling/coordinating care for drain management ? ? ? ? ? ?

## 2021-11-15 NOTE — Progress Notes (Signed)
Patient ID: Michele [name], female   DOB: June 04, 1966, 56 y.o.   MRN: [id number] ?[facility] Progress Note:   1 Day Post-Op  ?Subjective: ?Mental status is clear.  Complaints hungry. ?Objective: ?Vital signs in last 24 hours: ?Temp:  [97.9 ?F (36.6 ?C)-98.7 ?F (37.1 ?C)] 97.9 ?F (36.6 ?C) (04/07 0445) ?Pulse Rate:  [77-114] 77 (04/07 0445) ?Resp:  [10-19] 19 (04/07 0445) ?BP: (118-149)/(74-92) 123/84 (04/07 0445) ?SpO2:  [91 %-97 %] 97 % (04/07 0445) ?FiO2 (%):  [28 %] 28 % (04/06 1444) ? ?Intake/Output from previous day: ?04/06 0701 - 04/07 0700 ?In: 1751 [P.O.:240; I.V.:1120; IV Piggyback:400] ?Out: 20 [Blood:20] ?Intake/Output this shift: ?No intake/output data recorded. ? ?Physical Exam: Work of breathing is OK.  Wound dressed.  Ostomy with brown feculent output ? ?Lab Results:  ?Results for orders placed or performed during the hospital encounter of 11/05/21 (from the past 48 hour(s))  ?Glucose, capillary     Status: Abnormal  ? Collection Time: 11/13/21 11:39 AM  ?Result Value Ref Range  ? Glucose-Capillary 195 (H) 70 - 99 mg/dL  ?  Comment: Glucose reference range applies only to samples taken after fasting for at least 8 hours.  ?Glucose, capillary     Status: Abnormal  ? Collection Time: 11/13/21  5:07 PM  ?Result Value Ref Range  ? Glucose-Capillary 148 (H) 70 - 99 mg/dL  ?  Comment: Glucose reference range applies only to samples taken after fasting for at least 8 hours.  ?Glucose, capillary     Status: Abnormal  ? Collection Time: 11/13/21  7:49 PM  ?Result Value Ref Range  ? Glucose-Capillary 142 (H) 70 - 99 mg/dL  ?  Comment: Glucose reference range applies only to samples taken after fasting for at least 8 hours.  ?Glucose, capillary     Status: Abnormal  ? Collection Time: 11/13/21 11:46 PM  ?Result Value Ref Range  ? Glucose-Capillary 169 (H) 70 - 99 mg/dL  ?  Comment: Glucose reference range applies only to samples taken after fasting for at least 8 hours.  ?CBC     Status: Abnormal   ? Collection Time: 11/14/21 12:38 AM  ?Result Value Ref Range  ? WBC 16.5 (H) 4.0 - 10.5 K/uL  ? RBC 3.21 (L) 3.87 - 5.11 MIL/uL  ? Hemoglobin 8.1 (L) 12.0 - 15.0 g/dL  ? HCT 26.6 (L) 36.0 - 46.0 %  ? MCV 82.9 80.0 - 100.0 fL  ? MCH 25.2 (L) 26.0 - 34.0 pg  ? MCHC 30.5 30.0 - 36.0 g/dL  ? RDW 16.1 (H) 11.5 - 15.5 %  ? Platelets 789 (H) 150 - 400 K/uL  ? nRBC 0.0 0.0 - 0.2 %  ?  Comment: Performed at [facility], [place] [address] [place], [place] [address]  ?Basic metabolic panel     Status: Abnormal  ? Collection Time: 11/14/21 12:38 AM  ?Result Value Ref Range  ? Sodium 134 (L) 135 - 145 mmol/L  ? Potassium 4.3 3.5 - 5.1 mmol/L  ? Chloride 99 98 - 111 mmol/L  ? CO2 27 22 - 32 mmol/L  ? Glucose, Bld 167 (H) 70 - 99 mg/dL  ?  Comment: Glucose reference range applies only to samples taken after fasting for at least 8 hours.  ? BUN 14 6 - 20 mg/dL  ? Creatinine, Ser 0.75 0.44 - 1.00 mg/dL  ? Calcium 8.0 (L) 8.9 - 10.3 mg/dL  ? GFR, Estimated >60 >60 mL/min  ?  Comment: (NOTE) ?Calculated using the CKD-EPI Creatinine Equation (2021) ?  ? Anion gap 8 5 - 15  ?  Comment: Performed at [facility], [place] [address], [place], [place] [address]  ?Glucose, capillary     Status: Abnormal  ? Collection Time: 11/14/21  4:16 AM  ?Result Value Ref Range  ? Glucose-Capillary 155 (H) 70 - 99 mg/dL  ?  Comment: Glucose reference range applies only to samples taken after fasting for at least 8 hours.  ?Glucose, capillary     Status: Abnormal  ? Collection Time: 11/14/21  7:47 AM  ?Result Value Ref Range  ? Glucose-Capillary 149 (H) 70 - 99 mg/dL  ?  Comment: Glucose reference range applies only to samples taken after fasting for at least 8 hours.  ?Glucose, capillary     Status: Abnormal  ? Collection Time: 11/14/21 11:30 AM  ?Result Value Ref Range  ? Glucose-Capillary 190 (H) 70 - 99 mg/dL  ?  Comment: Glucose reference range applies only to samples taken after fasting for at least 8 hours.  ?Type and screen [place]     Status: None  ? Collection Time: 11/14/21 12:45 PM  ?Result Value Ref Range  ? ABO/RH(D) A NEG   ? Antibody Screen NEG   ? Sample Expiration    ?  11/17/2021,2359 ?Performed at [facility], [place] [address], [place], [place] [address] ?  ?Aerobic/Anaerobic Culture w Gram Stain (surgical/deep wound)     Status: None (Preliminary result)  ? Collection Time: 11/14/21  2:03 PM  ? Specimen: PATH Cytology Peritoneal fluid; Body Fluid  ?Result Value Ref Range  ? Specimen Description PERITONEAL FLUID   ? Special Requests PERITONEAL FLUID SPEC A   ? Gram Stain    ?  MODERATE WBC PRESENT,BOTH PMN AND MONONUCLEAR ?FEW GRAM VARIABLE ROD ?Performed at [facility], [place] [address], [place], [place] [address] ?  ? Culture PENDING   ? Report Status PENDING   ?Glucose, capillary     Status: Abnormal  ? Collection Time: 11/14/21  8:24 PM  ?Result Value Ref Range  ? Glucose-Capillary 164 (H) 70 - 99 mg/dL  ?  Comment: Glucose reference range applies only to samples taken after fasting for at least 8 hours.  ?Glucose, capillary     Status: Abnormal  ? Collection Time: 11/15/21 12:59 AM  ?Result Value Ref Range  ? Glucose-Capillary 219 (H) 70 - 99 mg/dL  ?  Comment: Glucose reference range applies only to samples taken after fasting for at least 8 hours.  ?Glucose, capillary     Status: Abnormal  ? Collection Time: 11/15/21  4:35 AM  ?Result Value Ref Range  ? Glucose-Capillary 146 (H) 70 - 99 mg/dL  ?  Comment: Glucose reference range applies only to samples taken after fasting for at least 8 hours.  ? ? ?Radiology/Results: ?DG Abd Portable 1V ? ?Result Date: 11/13/2021 ?CLINICAL DATA:  Persistent abdominal pain pain. History of bowel perforation. EXAM: PORTABLE ABDOMEN - 1 VIEW COMPARISON:  11/10/2021 FINDINGS: Drain in place in the right upper quadrant. No sign of small bowel obstruction or colon obstruction. Left lower abdominal colostomy. Gas within the soft tissues of the abdominal wall is again  demonstrated. IMPRESSION: Right upper quadrant region drain. Air within the soft tissues of the abdominal wall as seen on the previous CT. Electronically Signed   By: Nelson Chimes M.D.   On: 11/13/2021 13:09   ? ?Anti-infectives: ?  Anti-infectives (From admission, onward)  ? ? Start     Dose/Rate Route Frequency Ordered Stop  ? 11/05/21 2100  piperacillin-tazobactam (ZOSYN) IVPB 3.375 g       ? 3.375 g ?12.5 mL/hr over 240 Minutes Intravenous Every 8 hours 11/05/21 2010    ? 11/05/21 1500  piperacillin-tazobactam (ZOSYN) IVPB 3.375 g       ? 3.375 g ?100 mL/hr over 30 Minutes Intravenous  Once 11/05/21 1451 11/05/21 1600  ? ?  ? ? ?Assessment/Plan: ?Problem List: ?Patient Active Problem List  ? Diagnosis Date Noted  ? Bowel perforation ([place]) 11/05/2021  ? Abnormal uterine and vaginal bleeding, unspecified 10/25/2021  ? 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 Atypical chest pain 06/29/2013  ? Hyperlipidemia 06/15/2013  ? Chest pain 06/15/2013  ? ? ?Will advance to full liquids for now.  Patient needs to use abdominal binder when getting up.   ?1 Day Post-Op  ? ? LOS: 10 days  ? ?Matt B. Hassell Done, MD, FACS ? ?[facility] ?[phone] to reach the surgeon on call.   ? ?11/15/2021 8:35 AM  ?

## 2021-11-15 NOTE — Progress Notes (Signed)
Physical Therapy Discharge ?Patient Details ?Name: KYRIE BUN ?MRN: [id number] ?DOB: 03-12-1966 ?Today's Date: 11/15/2021 ?Time: 4462-8638 ?PT Time Calculation (min) (ACUTE ONLY): 28 min ? ?Patient discharged from PT services secondary to goals met and no further PT needs identified. ? ?Please see latest therapy progress note for current level of functioning and progress toward goals.   ? ?Progress and discharge plan discussed with patient and/or caregiver: Patient/Caregiver agrees with plan ? ?GP ?   ? ?Alvira Philips ?11/15/2021, 12:54 PM  ?Arrie Aran M,PT ?Acute Rehab Services ?[phone] ?[phone] (pager)  ?

## 2021-11-16 LAB — BASIC METABOLIC PANEL
Anion gap: 7 (ref 5–15)
BUN: 12 mg/dL (ref 6–20)
CO2: 25 mmol/L (ref 22–32)
Calcium: 7.9 mg/dL — ABNORMAL LOW (ref 8.9–10.3)
Chloride: 103 mmol/L (ref 98–111)
Creatinine, Ser: 0.64 mg/dL (ref 0.44–1.00)
GFR, Estimated: >60 mL/min (ref >60)
Glucose, Bld: 157 mg/dL — ABNORMAL HIGH (ref 70–99)
Potassium: 4.3 mmol/L (ref 3.5–5.1)
Sodium: 135 mmol/L (ref 135–145)

## 2021-11-16 LAB — GLUCOSE, CAPILLARY
Glucose-Capillary: 96 mg/dL (ref 70–99)
Glucose-Capillary: 127 mg/dL — ABNORMAL HIGH (ref 70–99)
Glucose-Capillary: 123 mg/dL — ABNORMAL HIGH (ref 70–99)
Glucose-Capillary: 169 mg/dL — ABNORMAL HIGH (ref 70–99)
Glucose-Capillary: 100 mg/dL — ABNORMAL HIGH (ref 70–99)

## 2021-11-16 MED ORDER — HYDROMORPHONE HCL 1 MG/ML IJ SOLN
1.0000 mg | INTRAMUSCULAR | Status: DC | PRN
  Administered 2021-11-17 – 2021-11-23 (×12): 1 mg via INTRAVENOUS
  Filled 2021-11-16 (×12): qty 1

## 2021-11-16 NOTE — Progress Notes (Signed)
Patient ID: Michele [name], female   DOB: [birth year], 56 y.o.   MRN: [id number] ?[facility] Progress Note:   2 Days Post-Op  ?Subjective: ?Mental status is clear.  Complaints not getting enough to eat. ?Objective: ?Vital signs in last 24 hours: ?Temp:  [98 ?F (36.7 ?C)-98.4 ?F (36.9 ?C)] 98.3 ?F (36.8 ?C) (04/08 3474) ?Pulse Rate:  [81-95] 95 (04/08 0742) ?Resp:  [16-18] 17 (04/08 0742) ?BP: (106-124)/(67-81) 121/71 (04/08 2595) ?SpO2:  [93 %-96 %] 94 % (04/08 0742) ?Weight:  [100.8 kg] 100.8 kg (04/08 0341) ? ?Intake/Output from previous day: ?04/07 0701 - 04/08 0700 ?In: 6387 [P.O.:1320; IV Piggyback:150] ?Out: 218 [Drains:18; Stool:200] ?Intake/Output this shift: ?Total I/O ?In: -  ?Out: 300 [Stool:300] ? ?Physical Exam: Work of breathing is normal.  No complaints of pain ? ?Lab Results:  ?Results for orders placed or performed during the hospital encounter of 11/05/21 (from the past 48 hour(s))  ?Glucose, capillary     Status: Abnormal  ? Collection Time: 11/14/21 11:30 AM  ?Result Value Ref Range  ? Glucose-Capillary 190 (H) 70 - 99 mg/dL  ?  Comment: Glucose reference range applies only to samples taken after fasting for at least 8 hours.  ?Type and screen [place]     Status: None  ? Collection Time: 11/14/21 12:45 PM  ?Result Value Ref Range  ? ABO/RH(D) A NEG   ? Antibody Screen NEG   ? Sample Expiration    ?  11/17/2021,2359 ?Performed at [facility], [place] [address], [place], [place] [address] ?  ?Aerobic/Anaerobic Culture w Gram Stain (surgical/deep wound)     Status: None (Preliminary result)  ? Collection Time: 11/14/21  2:03 PM  ? Specimen: PATH Cytology Peritoneal fluid; Body Fluid  ?Result Value Ref Range  ? Specimen Description PERITONEAL FLUID   ? Special Requests PERITONEAL FLUID SPEC A   ? Gram Stain    ?  MODERATE WBC PRESENT,BOTH PMN AND MONONUCLEAR ?FEW GRAM VARIABLE ROD ?  ? Culture    ?  MODERATE GRAM NEGATIVE RODS ?IDENTIFICATION AND  SUSCEPTIBILITIES TO FOLLOW ?Performed at [facility], [place] [address] [place], [place] [address] ?  ? Report Status PENDING   ?Glucose, capillary     Status: Abnormal  ? Collection Time: 11/14/21  8:24 PM  ?Result Value Ref Range  ? Glucose-Capillary 164 (H) 70 - 99 mg/dL  ?  Comment: Glucose reference range applies only to samples taken after fasting for at least 8 hours.  ?Glucose, capillary     Status: Abnormal  ? Collection Time: 11/15/21 12:59 AM  ?Result Value Ref Range  ? Glucose-Capillary 219 (H) 70 - 99 mg/dL  ?  Comment: Glucose reference range applies only to samples taken after fasting for at least 8 hours.  ?Glucose, capillary     Status: Abnormal  ? Collection Time: 11/15/21  4:35 AM  ?Result Value Ref Range  ? Glucose-Capillary 146 (H) 70 - 99 mg/dL  ?  Comment: Glucose reference range applies only to samples taken after fasting for at least 8 hours.  ?Glucose, capillary     Status: Abnormal  ? Collection Time: 11/15/21  9:56 AM  ?Result Value Ref Range  ? Glucose-Capillary 136 (H) 70 - 99 mg/dL  ?  Comment: Glucose reference range applies only to samples taken after fasting for at least 8 hours.  ?Glucose, capillary     Status: Abnormal  ? Collection Time: 11/15/21 12:59 PM  ?Result  Value Ref Range  ? Glucose-Capillary 187 (H) 70 - 99 mg/dL  ?  Comment: Glucose reference range applies only to samples taken after fasting for at least 8 hours.  ?Glucose, capillary     Status: Abnormal  ? Collection Time: 11/15/21  4:23 PM  ?Result Value Ref Range  ? Glucose-Capillary 125 (H) 70 - 99 mg/dL  ?  Comment: Glucose reference range applies only to samples taken after fasting for at least 8 hours.  ?Glucose, capillary     Status: Abnormal  ? Collection Time: 11/15/21  7:33 PM  ?Result Value Ref Range  ? Glucose-Capillary 119 (H) 70 - 99 mg/dL  ?  Comment: Glucose reference range applies only to samples taken after fasting for at least 8 hours.  ?Glucose, capillary     Status: Abnormal  ? Collection  Time: 11/15/21 11:41 PM  ?Result Value Ref Range  ? Glucose-Capillary 192 (H) 70 - 99 mg/dL  ?  Comment: Glucose reference range applies only to samples taken after fasting for at least 8 hours.  ?Basic metabolic panel     Status: Abnormal  ? Collection Time: 11/16/21  1:03 AM  ?Result Value Ref Range  ? Sodium 135 135 - 145 mmol/L  ? Potassium 4.3 3.5 - 5.1 mmol/L  ? Chloride 103 98 - 111 mmol/L  ? CO2 25 22 - 32 mmol/L  ? Glucose, Bld 157 (H) 70 - 99 mg/dL  ?  Comment: Glucose reference range applies only to samples taken after fasting for at least 8 hours.  ? BUN 12 6 - 20 mg/dL  ? Creatinine, Ser 0.64 0.44 - 1.00 mg/dL  ? Calcium 7.9 (L) 8.9 - 10.3 mg/dL  ? GFR, Estimated >60 >60 mL/min  ?  Comment: (NOTE) ?Calculated using the CKD-EPI Creatinine Equation (2021) ?  ? Anion gap 7 5 - 15  ?  Comment: Performed at [facility], [place] [address], [place], [place] [address]  ?Glucose, capillary     Status: None  ? Collection Time: 11/16/21  3:46 AM  ?Result Value Ref Range  ? Glucose-Capillary 96 70 - 99 mg/dL  ?  Comment: Glucose reference range applies only to samples taken after fasting for at least 8 hours.  ?Glucose, capillary     Status: Abnormal  ? Collection Time: 11/16/21  7:44 AM  ?Result Value Ref Range  ? Glucose-Capillary 127 (H) 70 - 99 mg/dL  ?  Comment: Glucose reference range applies only to samples taken after fasting for at least 8 hours.  ? ? ?Radiology/Results: ?No results found. ? ?Anti-infectives: ?Anti-infectives (From admission, onward)  ? ? Start     Dose/Rate Route Frequency Ordered Stop  ? 11/05/21 2100  piperacillin-tazobactam (ZOSYN) IVPB 3.375 g       ? 3.375 g ?12.5 mL/hr over 240 Minutes Intravenous Every 8 hours 11/05/21 2010    ? 11/05/21 1500  piperacillin-tazobactam (ZOSYN) IVPB 3.375 g       ? 3.375 g ?100 mL/hr over 30 Minutes Intravenous  Once 11/05/21 1451 11/05/21 1600  ? ?  ? ? ?Assessment/Plan: ?Problem List: ?Patient Active Problem List  ? Diagnosis Date Noted  ? Bowel  perforation ([place]) 11/05/2021  ? Abnormal uterine and vaginal bleeding, unspecified 10/25/2021  ? 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 Atypical chest pain 06/29/2013  ? Hyperlipidemia 06/15/2013  ? Chest pain 06/15/2013  ? ? ?She wants to expand diet.  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  Wound is being packed.  Patience and full liquid diet is prescribed.   ?2 Days Post-Op  ? ? LOS: 11 days  ? ?Matt B. Hassell Done, MD, FACS ? ?[facility] ?[phone] to reach the surgeon on call.   ? ?11/16/2021 11:00 AM  ?

## 2021-11-16 NOTE — Progress Notes (Signed)
Mobility Specialist Progress Note  ? ? 11/16/21 1105  ?Mobility  ?Activity Ambulated with assistance in hallway  ?Level of Assistance Standby assist, set-up cues, supervision of patient - no hands on  ?Assistive Device Front wheel walker  ?Distance Ambulated (ft) 370 ft  ?Activity Response Tolerated well  ?$Mobility charge 1 Mobility  ? ?Pt received sitting EOB and agreeable. C/o abdominal pain being greater than yesterday. Slow and steady gait. Returned to [facility] for void with sister present.  ? ?Hildred Alamin ?Mobility Specialist  ?  ?

## 2021-11-16 NOTE — Plan of Care (Signed)

## 2021-11-17 LAB — BASIC METABOLIC PANEL
Anion gap: 7 (ref 5–15)
BUN: 10 mg/dL (ref 6–20)
CO2: 25 mmol/L (ref 22–32)
Calcium: 8.2 mg/dL — ABNORMAL LOW (ref 8.9–10.3)
Chloride: 102 mmol/L (ref 98–111)
Creatinine, Ser: 0.68 mg/dL (ref 0.44–1.00)
GFR, Estimated: >60 mL/min (ref >60)
Glucose, Bld: 87 mg/dL (ref 70–99)
Potassium: 4.4 mmol/L (ref 3.5–5.1)
Sodium: 134 mmol/L — ABNORMAL LOW (ref 135–145)

## 2021-11-17 LAB — GLUCOSE, CAPILLARY
Glucose-Capillary: 115 mg/dL — ABNORMAL HIGH (ref 70–99)
Glucose-Capillary: 129 mg/dL — ABNORMAL HIGH (ref 70–99)
Glucose-Capillary: 153 mg/dL — ABNORMAL HIGH (ref 70–99)
Glucose-Capillary: 164 mg/dL — ABNORMAL HIGH (ref 70–99)
Glucose-Capillary: 165 mg/dL — ABNORMAL HIGH (ref 70–99)
Glucose-Capillary: 168 mg/dL — ABNORMAL HIGH (ref 70–99)
Glucose-Capillary: 86 mg/dL (ref 70–99)

## 2021-11-17 NOTE — Progress Notes (Signed)
Patient ID: Michele [name], female   DOB: [birth year], 56 y.o.   MRN: [id number] ?[facility] Progress Note:   3 Days Post-Op  ?Subjective: ?Mental status is clear and better disposition.  Complaints few. ?Objective: ?Vital signs in last 24 hours: ?Temp:  [98.1 ?F (36.7 ?C)-99 ?F (37.2 ?C)] 98.7 ?F (37.1 ?C) (04/09 4235) ?Pulse Rate:  [96-119] 119 (04/09 0832) ?Resp:  [17-18] 17 (04/09 3614) ?BP: (121-168)/(69-86) 121/69 (04/09 4315) ?SpO2:  [97 %-100 %] 98 % (04/09 0832) ?Weight:  [98.9 kg] 98.9 kg (04/09 0425) ? ?Intake/Output from previous day: ?04/08 0701 - 04/09 0700 ?In: 10  ?Out: 760 [Drains:35; Stool:725] ?Intake/Output this shift: ?Total I/O ?In: 120 [P.O.:120] ?Out: -  ? ?Physical Exam: Work of breathing is normal.  Up in chair.  Wound with retention sutures and some drainage. No redness ? ?Lab Results:  ?Results for orders placed or performed during the hospital encounter of 11/05/21 (from the past 48 hour(s))  ?Glucose, capillary     Status: Abnormal  ? Collection Time: 11/15/21 12:59 PM  ?Result Value Ref Range  ? Glucose-Capillary 187 (H) 70 - 99 mg/dL  ?  Comment: Glucose reference range applies only to samples taken after fasting for at least 8 hours.  ?Glucose, capillary     Status: Abnormal  ? Collection Time: 11/15/21  4:23 PM  ?Result Value Ref Range  ? Glucose-Capillary 125 (H) 70 - 99 mg/dL  ?  Comment: Glucose reference range applies only to samples taken after fasting for at least 8 hours.  ?Glucose, capillary     Status: Abnormal  ? Collection Time: 11/15/21  7:33 PM  ?Result Value Ref Range  ? Glucose-Capillary 119 (H) 70 - 99 mg/dL  ?  Comment: Glucose reference range applies only to samples taken after fasting for at least 8 hours.  ?Glucose, capillary     Status: Abnormal  ? Collection Time: 11/15/21 11:41 PM  ?Result Value Ref Range  ? Glucose-Capillary 192 (H) 70 - 99 mg/dL  ?  Comment: Glucose reference range applies only to samples taken after fasting for at least 8  hours.  ?Basic metabolic panel     Status: Abnormal  ? Collection Time: 11/16/21  1:03 AM  ?Result Value Ref Range  ? Sodium 135 135 - 145 mmol/L  ? Potassium 4.3 3.5 - 5.1 mmol/L  ? Chloride 103 98 - 111 mmol/L  ? CO2 25 22 - 32 mmol/L  ? Glucose, Bld 157 (H) 70 - 99 mg/dL  ?  Comment: Glucose reference range applies only to samples taken after fasting for at least 8 hours.  ? BUN 12 6 - 20 mg/dL  ? Creatinine, Ser 0.64 0.44 - 1.00 mg/dL  ? Calcium 7.9 (L) 8.9 - 10.3 mg/dL  ? GFR, Estimated >60 >60 mL/min  ?  Comment: (NOTE) ?Calculated using the CKD-EPI Creatinine Equation (2021) ?  ? Anion gap 7 5 - 15  ?  Comment: Performed at [facility], [place] [address] [place], [place] [address]  ?Glucose, capillary     Status: None  ? Collection Time: 11/16/21  3:46 AM  ?Result Value Ref Range  ? Glucose-Capillary 96 70 - 99 mg/dL  ?  Comment: Glucose reference range applies only to samples taken after fasting for at least 8 hours.  ?Glucose, capillary     Status: Abnormal  ? Collection Time: 11/16/21  7:44 AM  ?Result Value Ref Range  ? Glucose-Capillary 127 (H) 70 - 99 mg/dL  ?  Comment: Glucose reference range applies only to samples taken after fasting for at least 8 hours.  ?Glucose, capillary     Status: Abnormal  ? Collection Time: 11/16/21 12:06 PM  ?Result Value Ref Range  ? Glucose-Capillary 123 (H) 70 - 99 mg/dL  ?  Comment: Glucose reference range applies only to samples taken after fasting for at least 8 hours.  ?Glucose, capillary     Status: Abnormal  ? Collection Time: 11/16/21  4:27 PM  ?Result Value Ref Range  ? Glucose-Capillary 169 (H) 70 - 99 mg/dL  ?  Comment: Glucose reference range applies only to samples taken after fasting for at least 8 hours.  ?Glucose, capillary     Status: Abnormal  ? Collection Time: 11/16/21  8:11 PM  ?Result Value Ref Range  ? Glucose-Capillary 100 (H) 70 - 99 mg/dL  ?  Comment: Glucose reference range applies only to samples taken after fasting for at least 8 hours.   ?Glucose, capillary     Status: Abnormal  ? Collection Time: 11/17/21 12:01 AM  ?Result Value Ref Range  ? Glucose-Capillary 165 (H) 70 - 99 mg/dL  ?  Comment: Glucose reference range applies only to samples taken after fasting for at least 8 hours.  ?Basic metabolic panel     Status: Abnormal  ? Collection Time: 11/17/21  2:58 AM  ?Result Value Ref Range  ? Sodium 134 (L) 135 - 145 mmol/L  ? Potassium 4.4 3.5 - 5.1 mmol/L  ? Chloride 102 98 - 111 mmol/L  ? CO2 25 22 - 32 mmol/L  ? Glucose, Bld 87 70 - 99 mg/dL  ?  Comment: Glucose reference range applies only to samples taken after fasting for at least 8 hours.  ? BUN 10 6 - 20 mg/dL  ? Creatinine, Ser 0.68 0.44 - 1.00 mg/dL  ? Calcium 8.2 (L) 8.9 - 10.3 mg/dL  ? GFR, Estimated >60 >60 mL/min  ?  Comment: (NOTE) ?Calculated using the CKD-EPI Creatinine Equation (2021) ?  ? Anion gap 7 5 - 15  ?  Comment: Performed at [facility], [place] [address] [place], [place] [address]  ?Glucose, capillary     Status: None  ? Collection Time: 11/17/21  4:15 AM  ?Result Value Ref Range  ? Glucose-Capillary 86 70 - 99 mg/dL  ?  Comment: Glucose reference range applies only to samples taken after fasting for at least 8 hours.  ?Glucose, capillary     Status: Abnormal  ? Collection Time: 11/17/21  8:29 AM  ?Result Value Ref Range  ? Glucose-Capillary 168 (H) 70 - 99 mg/dL  ?  Comment: Glucose reference range applies only to samples taken after fasting for at least 8 hours.  ? ? ?Radiology/Results: ?No results found. ? ?Anti-infectives: ?Anti-infectives (From admission, onward)  ? ? Start     Dose/Rate Route Frequency Ordered Stop  ? 11/05/21 2100  piperacillin-tazobactam (ZOSYN) IVPB 3.375 g  Status:  Discontinued       ? 3.375 g ?12.5 mL/hr over 240 Minutes Intravenous Every 8 hours 11/05/21 2010 11/17/21 0932  ? 11/05/21 1500  piperacillin-tazobactam (ZOSYN) IVPB 3.375 g       ? 3.375 g ?100 mL/hr over 30 Minutes Intravenous  Once 11/05/21 1451 11/05/21 1600  ? ?   ? ? ?Assessment/Plan: ?Problem List: ?Patient Active Problem List  ? Diagnosis Date Noted  ? Bowel perforation ([place]) 11/05/2021  ? Abnormal uterine and vaginal bleeding, unspecified 10/25/2021  ? S/P TAH (  total abdominal hysterectomy) 10/25/2021  ? Atypical chest pain 06/29/2013  ? Hyperlipidemia 06/15/2013  ? Chest pain 06/15/2013  ? ? ?She is taking fortified liquids well and I am reluctant to advance for now.  ?3 Days Post-Op  ? ? LOS: 12 days  ? ?Matt B. Hassell Done, MD, FACS ? ?[facility] ?[phone] to reach the surgeon on call.   ? ?11/17/2021 10:35 AM  ?

## 2021-11-18 LAB — AEROBIC/ANAEROBIC CULTURE W GRAM STAIN (SURGICAL/DEEP WOUND)

## 2021-11-18 LAB — CBC
HCT: 27.1 % — ABNORMAL LOW (ref 36.0–46.0)
Hemoglobin: 8.4 g/dL — ABNORMAL LOW (ref 12.0–15.0)
MCH: 25.3 pg — ABNORMAL LOW (ref 26.0–34.0)
MCHC: 31 g/dL (ref 30.0–36.0)
MCV: 81.6 fL (ref 80.0–100.0)
Platelets: 891 10*3/uL — ABNORMAL HIGH (ref 150–400)
RBC: 3.32 MIL/uL — ABNORMAL LOW (ref 3.87–5.11)
RDW: 16.2 % — ABNORMAL HIGH (ref 11.5–15.5)
WBC: 8.5 10*3/uL (ref 4.0–10.5)
nRBC: 0 % (ref 0.0–0.2)

## 2021-11-18 LAB — GLUCOSE, CAPILLARY
Glucose-Capillary: 91 mg/dL (ref 70–99)
Glucose-Capillary: 109 mg/dL — ABNORMAL HIGH (ref 70–99)
Glucose-Capillary: 106 mg/dL — ABNORMAL HIGH (ref 70–99)
Glucose-Capillary: 170 mg/dL — ABNORMAL HIGH (ref 70–99)
Glucose-Capillary: 152 mg/dL — ABNORMAL HIGH (ref 70–99)
Glucose-Capillary: 163 mg/dL — ABNORMAL HIGH (ref 70–99)

## 2021-11-18 NOTE — Progress Notes (Signed)
Mobility Specialist Progress Note: ? ? 11/18/21 1523  ?Mobility  ?Activity Refused mobility  ? ?Pt sleeping but woke up upon entry. Pt in a lot of pain and finally getting some rest. Will follow-up as time allows.  ? ?Ranell Finelli ?Mobility Specialist ?Primary Phone [phone] ? ?

## 2021-11-18 NOTE — TOC Transition Note (Signed)
Transition of Care (TOC) - CM/SW Discharge Note ? ? ?Patient Details  ?Name: Michele [name] ?MRN: [id number] ?Date of Birth: 09/01/1965 ? ?Transition of Care (TOC) CM/SW Contact:  ?Marilu Favre, RN ?Phone Number: ?11/18/2021, 1:07 PM ? ? ?Clinical Narrative:    ? ?Discussed with [facility] PA. Patient will discharge with wet to dry dressing, drain and ostomy. HHRN order updated.  ? ?Bedside nurse teaching patient and family wound care, ostomy and drain care.  ? ?Hoyle Sauer with [facility] updated.  ? ?Olivia Mackie with [facility] updated  ? ?Final next level of care: [place] ?Barriers to Discharge: Continued Medical Work up ? ? ?Patient Goals and CMS Choice ?Patient states their goals for this hospitalization and ongoing recovery are:: to return to home ?CMS Medicare.gov Compare Post Acute Care list provided to:: Patient ?Choice offered to / list presented to : Patient ? ?Discharge Placement ?  ?           ?  ?  ?  ?  ? ?Discharge Plan and Services ?  ?Discharge Planning Services: CM Consult ?Post Acute Care Choice: Home Health          ?  ?  ?  ?  ?  ?HH Arranged: RN ?  ?  ?  ?  ? ?Social Determinants of Health (SDOH) Interventions ?  ? ? ?Readmission Risk Interventions ?   ? View : No data to display.  ?  ?  ?  ? ? ? ? ? ?

## 2021-11-18 NOTE — Progress Notes (Signed)
? ?Progress Note ? ?4 Days Post-Op  ?Subjective: ?NAEON. Pain is stable and controlled with oxycodone. Tolerating full liquids without nausea or emesis. Having stool and gas output in ostomy. Tolerating wound care ? ?Sister is bedside. ? ?Objective: ?Vital signs in last 24 hours: ?Temp:  [98 ?F (36.7 ?C)-98.4 ?F (36.9 ?C)] 98.2 ?F (36.8 ?C) (04/10 0749) ?Pulse Rate:  [80-105] 80 (04/10 0749) ?Resp:  [17-19] 18 (04/10 0749) ?BP: (106-136)/(69-84) 136/84 (04/10 0749) ?SpO2:  [96 %-100 %] 98 % (04/10 0749) ?Last BM Date : 11/17/21 ? ?Intake/Output from previous day: ?04/09 0701 - 04/10 0700 ?In: 73 [P.O.:480; IV Piggyback:200] ?Out: 310 [Drains:10; WLNLG:921] ?Intake/Output this shift: ?No intake/output data recorded. ? ?PE: ?General: pleasant, WD, female who is laying in bed in NAD ?Heart: regular, rate, and rhythm. Palpable radial pulses bilaterally ?Lungs: Respiratory effort nonlabored ?Abd: soft, NT, ND, +BS, colostomy bag recently changed and without gas or stool - stoma red and viable. Midline incision with dressing c/d/I. Dressing change by me this am. Retention sutures intact. Some serous drainage but no frank purulence ?MSK: mild bilateral lower extremity edema ?Skin: warm and dry ?Psych: A&Ox3 with an appropriate affect.  ? ? ?Lab Results:  ?No results for input(s): WBC, HGB, HCT, PLT in the last 72 hours. ?BMET ?Recent Labs  ?  11/16/21 ?0103 11/17/21 ?0258  ?NA 135 134*  ?K 4.3 4.4  ?CL 103 102  ?CO2 25 25  ?GLUCOSE 157* 87  ?BUN 12 10  ?CREATININE 0.64 0.68  ?CALCIUM 7.9* 8.2*  ? ?PT/INR ?No results for input(s): LABPROT, INR in the last 72 hours. ?CMP  ?   ?Component Value Date/Time  ? NA 134 (L) 11/17/2021 0258  ? K 4.4 11/17/2021 0258  ? CL 102 11/17/2021 0258  ? CO2 25 11/17/2021 0258  ? GLUCOSE 87 11/17/2021 0258  ? BUN 10 11/17/2021 0258  ? CREATININE 0.68 11/17/2021 0258  ? CALCIUM 8.2 (L) 11/17/2021 0258  ? PROT 6.0 (L) 11/05/2021 1255  ? ALBUMIN 1.9 (L) 11/05/2021 1255  ? AST 32 11/05/2021 1255   ? ALT 22 11/05/2021 1255  ? ALKPHOS 138 (H) 11/05/2021 1255  ? BILITOT 0.7 11/05/2021 1255  ? GFRNONAA >60 11/17/2021 0258  ? ?Lipase  ?   ?Component Value Date/Time  ? LIPASE 21 11/05/2021 1255  ? ? ? ? ? ?Studies/Results: ?No results found. ? ?Anti-infectives: ?Anti-infectives (From admission, onward)  ? ? Start     Dose/Rate Route Frequency Ordered Stop  ? 11/05/21 2100  piperacillin-tazobactam (ZOSYN) IVPB 3.375 g  Status:  Discontinued       ? 3.375 g ?12.5 mL/hr over 240 Minutes Intravenous Every 8 hours 11/05/21 2010 11/17/21 0932  ? 11/05/21 1500  piperacillin-tazobactam (ZOSYN) IVPB 3.375 g       ? 3.375 g ?100 mL/hr over 30 Minutes Intravenous  Once 11/05/21 1451 11/05/21 1600  ? ?  ? ? ? ?Assessment/Plan ?POD 13, s/p ex lap with Hartmann's procedure for perforated diverticulitis by Dr. 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 Recheck in am ?- CT A/P 4/2 w/ multiple intra-abd fluid collections, two large collections in upper abd.  ?- s/p IR drain RUQ 4/3, serous, culture with e coli. Monitor drain ?- 4/6 intra op culture with e coli ?- continue IV abx ?- continue midline wound care ?- having bowel function  and tolerating FLD. Will advance to soft diet today ?- WOCN following for new ostomy ? ?FEN: advance to soft, ensure, SLIV ?ID: zosyn 3/30>4/8 ?VTE: lovenox ? ? LOS: 13 days  ? ?Winferd Humphrey, PA-C ?[facility] ?11/18/2021, 9:48 AM ?Please see Amion for pager number during day hours 7:00am-4:30pm ? ?

## 2021-11-18 NOTE — Consult Note (Signed)
[facility] Nurse ostomy follow up ?Stoma type/location: LLQ, end colostomy ?Stomal assessment/size: difficult to measure, aprox. 1 1/2", however opening including the large area of mucocutaneous separation is 3cm x 2cm  ?Peristomal assessment:  intact; slightly reddened  ?Treatment options for stomal/peristomal skin: skin barrier ring-** to be placed outside the mucocutaneous edge ?Output liquid brown ?Ostomy pouching: 1pc flex convex with 2" skin barrier ring, may need belt  ?Education provided:  ?Explained stoma characteristics; her's specifically; challenging with being retracted ?Demonstrated pouch change (cutting new skin barrier,cleaning peristomal skin and stoma, use of barrier ring) ?She did recall much of the education from last week  ?Education on emptying when 1/3 to 1/2 full and how to empty ?Demonstrated use of wick to clean spout  ?Provided patient with [employer]  ?Sister at bedside, however she lives in [place].  Entire family lives in [place]. Has two teenage children at home with her. However reports "they will not be doing anything with the pouch".  Reports her ex-husband is more inclinded to be involved in her care.  ?Sister at bedside asked about deodorizers they found on [place] this weekend.  Suggested they hold off on ordering items from [facility] until we finish teaching with patient this week. Patient does not have any questions today, she was not really clear on the reason for the stoma. Discussed further the rationale for ostomy ?  ?Enrolled patient in [place] Start Discharge program: Yes  ? ?[facility] Nurse will follow along with you for continued support with ostomy teaching and care ?Yiselle Babich Liane Comber MSN, RN, [place], [place], [place] ?[phone]  ?

## 2021-11-18 NOTE — Progress Notes (Signed)
? ? ?Referring Physician(s): ?Ralene Ok, MD ? ?Supervising Physician: Juliet Rude ? ?Patient Status:  [facility] - In-pt ? ?Chief Complaint: ? ?Perforated diverticulitis complicated by intra-abdominal fluid collection, s/p RUQ dran placement by Dr. Serafina Royals on 11/11/2021 ? ?Subjective: ? ?Pt resting in bed. She states she is feeling a little better today. She reports getting up and going to BR and getting a sponge bath.  ? ?Allergies: ?Lipitor [atorvastatin] and Sulfa antibiotics ? ?Medications: ?Prior to Admission medications   ?Medication Sig Start Date End Date Taking? Authorizing Provider  ?ADDERALL XR 30 MG 24 hr capsule Take 30 mg by mouth daily. 09/23/21  Yes [provider]  ?citalopram (CELEXA) 20 MG tablet Take 20 mg by mouth daily.   Yes [provider]  ?CRESTOR 5 MG tablet TAKE 1 TABLET BY MOUTH EVERY DAY ?Patient taking differently: Take 5 mg by mouth daily. 04/17/15  Yes Dorothy Spark, MD  ?ibuprofen (ADVIL) 600 MG tablet Take 1 tablet (600 mg total) by mouth every 6 (six) hours as needed for cramping or moderate pain. 10/28/21  Yes Sanjuana Kava, MD  ?oxyCODONE-acetaminophen (PERCOCET/ROXICET) 5-325 MG tablet Take 1-2 tablets by mouth every 4 (four) hours as needed for severe pain or moderate pain. 10/28/21  Yes Sanjuana Kava, MD  ?simethicone (MYLICON) 408 MG chewable tablet Chew 125 mg by mouth every 6 (six) hours as needed for flatulence.   Yes [provider]  ?sodium chloride flush (NS) 0.9 % SOLN 5 mLs by Intracatheter route daily for 28 days. 11/12/21 12/10/21  Han, Aimee H, PA-C  ? ? ? ?Vital Signs: ?BP 136/84 (BP Location: Left Arm)   Pulse 80   Temp 98.2 ?F (36.8 ?C) (Oral)   Resp 18   Ht 5' (1.524 m)   Wt 218 lb 0.6 oz (98.9 kg)   LMP 10/07/2021 (Approximate)   SpO2 98%   BMI 42.58 kg/m?  ? ?Physical Exam ?Constitutional:   ?   General: She is not in acute distress. ?   Appearance: Normal appearance.  ?HENT:  ?   Head: Normocephalic and atraumatic.  ?Eyes:  ?    Extraocular Movements: Extraocular movements intact.  ?   Pupils: Pupils are equal, round, and reactive to light.  ?Pulmonary:  ?   Effort: Pulmonary effort is normal. No respiratory distress.  ?Abdominal:  ?   Comments: RUQ drain intact with biodisk and sutures in place. Dressing is C/D/I. Drain flushed easily. ~ 10 cc murky, yellowish OP in JP with 5 cc documented OP in 24 hours.   ?Musculoskeletal:  ?   Right lower leg: No edema.  ?   Left lower leg: No edema.  ?Skin: ?   General: Skin is warm and dry.  ?Neurological:  ?   Mental Status: She is alert and oriented to person, place, and time.  ?Psychiatric:     ?   Mood and Affect: Mood normal.     ?   Behavior: Behavior normal.     ?   Thought Content: Thought content normal.     ?   Judgment: Judgment normal.  ? ? ?Imaging: ?No results found. ? ?Labs: ? ?CBC: ?Recent Labs  ?  11/11/21 ?0303 11/12/21 ?0103 11/13/21 ?0150 11/14/21 ?0038  ?WBC 20.9* 16.4* 16.0* 16.5*  ?HGB 9.6* 9.6* 8.6* 8.1*  ?HCT 29.7* 28.9* 27.1* 26.6*  ?PLT 662* 632* 712* 789*  ? ? ?COAGS: ?No results for input(s): INR, APTT in the last 8760 hours. ? ?BMP: ?Recent Labs  ?  11/13/21 ?0150 11/14/21 ?9798 11/16/21 ?0103 11/17/21 ?9211  ?NA 133* 134* 135 134*  ?K 3.7 4.3 4.3 4.4  ?CL 100 99 103 102  ?CO2 '26 27 25 25  '$ ?GLUCOSE 188* 167* 157* 87  ?BUN '9 14 12 10  '$ ?CALCIUM 7.6* 8.0* 7.9* 8.2*  ?CREATININE 0.70 0.75 0.64 0.68  ?GFRNONAA >60 >60 >60 >60  ? ? ?LIVER FUNCTION TESTS: ?Recent Labs  ?  11/05/21 ?1255  ?BILITOT 0.7  ?AST 32  ?ALT 22  ?ALKPHOS 138*  ?PROT 6.0*  ?ALBUMIN 1.9*  ? ? ?Assessment and Plan: ? ?Perforated diverticulitis complicated by intra-abdominal fluid collection, s/p RUQ dran placement by Dr. Serafina Royals on 11/11/2021 ? ?Pt resting in bed. She states she is feeling a little better today. She reports getting up and going to BR and getting a sponge bath. ? ?~15 cc OP in 24 hours.  ?WBC elevated 4/6- no labs since ?VSS, afebrile ?Body fluid Culture result: ?Moderate Escherichia coli   ?Abundant Bacteroids Fragilis ?Beta lactamase positive ?Few corynebacterium striatum ? ? Continue documenting OP in Epic q shift.  ?Continue flushing TID.  ?Change dressing q shift or as needed.  ?Please call IR if difficulty flushing or sudden change in output.  ? ?IR to follow- call with questions or concerns.  ? ? Electronically Signed: ?Tyson Alias, NP ?11/18/2021, 12:21 PM ? ? ?I spent a total of 15 Minutes at the the patient's bedside AND on the patient's hospital floor or unit, greater than 50% of which was counseling/coordinating care for s/p RUQ drain placement 11/11/21.  ? ? ? ?  ?

## 2021-11-19 LAB — BASIC METABOLIC PANEL
Anion gap: 9 (ref 5–15)
BUN: 11 mg/dL (ref 6–20)
CO2: 24 mmol/L (ref 22–32)
Calcium: 8.6 mg/dL — ABNORMAL LOW (ref 8.9–10.3)
Chloride: 102 mmol/L (ref 98–111)
Creatinine, Ser: 0.56 mg/dL (ref 0.44–1.00)
GFR, Estimated: >60 mL/min (ref >60)
Glucose, Bld: 129 mg/dL — ABNORMAL HIGH (ref 70–99)
Potassium: 4.6 mmol/L (ref 3.5–5.1)
Sodium: 135 mmol/L (ref 135–145)

## 2021-11-19 LAB — GLUCOSE, CAPILLARY
Glucose-Capillary: 103 mg/dL — ABNORMAL HIGH (ref 70–99)
Glucose-Capillary: 129 mg/dL — ABNORMAL HIGH (ref 70–99)
Glucose-Capillary: 140 mg/dL — ABNORMAL HIGH (ref 70–99)
Glucose-Capillary: 175 mg/dL — ABNORMAL HIGH (ref 70–99)
Glucose-Capillary: 206 mg/dL — ABNORMAL HIGH (ref 70–99)
Glucose-Capillary: 98 mg/dL (ref 70–99)

## 2021-11-19 LAB — CBC
HCT: 25.7 % — ABNORMAL LOW (ref 36.0–46.0)
Hemoglobin: 8 g/dL — ABNORMAL LOW (ref 12.0–15.0)
MCH: 25.5 pg — ABNORMAL LOW (ref 26.0–34.0)
MCHC: 31.1 g/dL (ref 30.0–36.0)
MCV: 81.8 fL (ref 80.0–100.0)
Platelets: 779 10*3/uL — ABNORMAL HIGH (ref 150–400)
RBC: 3.14 MIL/uL — ABNORMAL LOW (ref 3.87–5.11)
RDW: 16.1 % — ABNORMAL HIGH (ref 11.5–15.5)
WBC: 7.8 10*3/uL (ref 4.0–10.5)
nRBC: 0 % (ref 0.0–0.2)

## 2021-11-19 MED ORDER — KETOROLAC TROMETHAMINE 15 MG/ML IJ SOLN
30.0000 mg | Freq: Four times a day (QID) | INTRAMUSCULAR | Status: AC
Start: 2021-11-19 — End: 2021-11-24
  Administered 2021-11-19 – 2021-11-24 (×20): 30 mg via INTRAVENOUS
  Filled 2021-11-19 (×20): qty 2

## 2021-11-19 MED ORDER — TRAMADOL HCL 50 MG PO TABS
50.0000 mg | ORAL_TABLET | Freq: Four times a day (QID) | ORAL | Status: DC
  Administered 2021-11-19 – 2021-11-29 (×39): 50 mg via ORAL
  Filled 2021-11-19 (×40): qty 1

## 2021-11-19 MED ORDER — METHOCARBAMOL 500 MG PO TABS
1000.0000 mg | ORAL_TABLET | Freq: Four times a day (QID) | ORAL | Status: DC
  Administered 2021-11-19 – 2021-11-29 (×41): 1000 mg via ORAL
  Filled 2021-11-19 (×41): qty 2

## 2021-11-19 MED ORDER — OXYCODONE HCL 5 MG PO TABS
10.0000 mg | ORAL_TABLET | ORAL | Status: DC | PRN
  Administered 2021-11-20 – 2021-11-21 (×3): 15 mg via ORAL
  Administered 2021-11-21: 10 mg via ORAL
  Administered 2021-11-21 (×2): 15 mg via ORAL
  Administered 2021-11-22 (×3): 10 mg via ORAL
  Administered 2021-11-23 – 2021-11-24 (×5): 15 mg via ORAL
  Administered 2021-11-25 – 2021-11-26 (×7): 10 mg via ORAL
  Administered 2021-11-26: 15 mg via ORAL
  Administered 2021-11-26: 10 mg via ORAL
  Administered 2021-11-27: 15 mg via ORAL
  Administered 2021-11-27: 10 mg via ORAL
  Administered 2021-11-28 – 2021-11-29 (×3): 15 mg via ORAL
  Filled 2021-11-19 (×4): qty 3
  Filled 2021-11-19: qty 2
  Filled 2021-11-19 (×2): qty 3
  Filled 2021-11-19 (×2): qty 2
  Filled 2021-11-19 (×3): qty 3
  Filled 2021-11-19: qty 2
  Filled 2021-11-19: qty 3
  Filled 2021-11-19: qty 2
  Filled 2021-11-19 (×2): qty 3
  Filled 2021-11-19 (×3): qty 2
  Filled 2021-11-19 (×4): qty 3
  Filled 2021-11-19 (×3): qty 2
  Filled 2021-11-19: qty 3
  Filled 2021-11-19 (×2): qty 2
  Filled 2021-11-19: qty 3
  Filled 2021-11-19 (×2): qty 2

## 2021-11-19 MED ORDER — POTASSIUM CHLORIDE 20 MEQ PO PACK
20.0000 meq | PACK | Freq: Every day | ORAL | Status: DC
  Administered 2021-11-20 – 2021-11-29 (×9): 20 meq via ORAL
  Filled 2021-11-19 (×8): qty 1

## 2021-11-19 MED ORDER — LACTATED RINGERS IV BOLUS
1000.0000 mL | Freq: Once | INTRAVENOUS | Status: AC
  Administered 2021-11-19: 1000 mL via INTRAVENOUS

## 2021-11-19 NOTE — Progress Notes (Signed)
PT Cancellation Note ? ?Patient Details ?Name: Michele [name] ?MRN: [id number] ?DOB: [birth year] ? ? ?Cancelled Treatment:    Reason Eval/Treat Not Completed: Other (comment); noted pt just refused mobility as back to bed after mobilizing in the room.  Will continue attempts.  ? ? ?Reginia Naas ?11/19/2021, 4:46 PM ?Magda Kiel, PT ?Acute Rehabilitation Services ?IBBCW:[phone] ?[phone] ?11/19/2021 ? ?

## 2021-11-19 NOTE — Progress Notes (Addendum)
? ? ?Referring Physician(s): ?Ralene Ok, MD ? ?Supervising Physician: Aletta Edouard ? ?Patient Status:  [facility] - In-pt ? ?Chief Complaint: ? ?Perforated diverticulitis complicated by intra-abdominal fluid collection, s/p RUQ drain placement by Dr. Serafina Royals on 11/11/2021 ? ?Subjective: ? ?Pt sitting upright in recliner. She c/o diffuse abdominal pain. MD at bedside states she will order CT Abd. Pt denies pain at drain insertion site or with flushing.  ? ?Allergies: ?Lipitor [atorvastatin] and Sulfa antibiotics ? ?Medications: ?Prior to Admission medications   ?Medication Sig Start Date End Date Taking? Authorizing Provider  ?ADDERALL XR 30 MG 24 hr capsule Take 30 mg by mouth daily. 09/23/21  Yes [provider]  ?citalopram (CELEXA) 20 MG tablet Take 20 mg by mouth daily.   Yes [provider]  ?CRESTOR 5 MG tablet TAKE 1 TABLET BY MOUTH EVERY DAY ?Patient taking differently: Take 5 mg by mouth daily. 04/17/15  Yes Dorothy Spark, MD  ?ibuprofen (ADVIL) 600 MG tablet Take 1 tablet (600 mg total) by mouth every 6 (six) hours as needed for cramping or moderate pain. 10/28/21  Yes Sanjuana Kava, MD  ?oxyCODONE-acetaminophen (PERCOCET/ROXICET) 5-325 MG tablet Take 1-2 tablets by mouth every 4 (four) hours as needed for severe pain or moderate pain. 10/28/21  Yes Sanjuana Kava, MD  ?simethicone (MYLICON) 628 MG chewable tablet Chew 125 mg by mouth every 6 (six) hours as needed for flatulence.   Yes [provider]  ?sodium chloride flush (NS) 0.9 % SOLN 5 mLs by Intracatheter route daily for 28 days. 11/12/21 12/10/21  Han, Aimee H, PA-C  ? ? ? ?Vital Signs: ?BP 125/74 (BP Location: Left Arm)   Pulse 96   Temp 98.1 ?F (36.7 ?C) (Oral)   Resp 17   Ht 5' (1.524 m)   Wt 218 lb 0.6 oz (98.9 kg)   LMP 10/07/2021 (Approximate)   SpO2 94%   BMI 42.58 kg/m?  ? ?Physical Exam ?Constitutional:   ?   General: She is not in acute distress. ?   Appearance: She is ill-appearing.  ?HENT:  ?   Head:  Normocephalic and atraumatic.  ?Eyes:  ?   Extraocular Movements: Extraocular movements intact.  ?   Pupils: Pupils are equal, round, and reactive to light.  ?Pulmonary:  ?   Effort: Pulmonary effort is normal. No respiratory distress.  ?Abdominal:  ?   Comments: RUQ drain intact with biodisk and sutures in place. Dressing is C/D/I. Drain flushed easily. Scant amt hazy, white OP in JP with 70 cc documented OP in 24 hours.    ?Skin: ?   General: Skin is warm and dry.  ?Neurological:  ?   Mental Status: She is alert and oriented to person, place, and time.  ?Psychiatric:     ?   Mood and Affect: Mood normal.     ?   Behavior: Behavior normal.     ?   Thought Content: Thought content normal.     ?   Judgment: Judgment normal.  ? ? ?Imaging: ?No results found. ? ?Labs: ? ?CBC: ?Recent Labs  ?  11/13/21 ?0150 11/14/21 ?0038 11/18/21 ?1434 11/19/21 ?0114  ?WBC 16.0* 16.5* 8.5 7.8  ?HGB 8.6* 8.1* 8.4* 8.0*  ?HCT 27.1* 26.6* 27.1* 25.7*  ?PLT 712* 789* 891* 779*  ? ? ?COAGS: ?No results for input(s): INR, APTT in the last 8760 hours. ? ?BMP: ?Recent Labs  ?  11/14/21 ?6381 11/16/21 ?0103 11/17/21 ?7711 11/19/21 ?6579  ?NA 134* 135 134* 135  ?  K 4.3 4.3 4.4 4.6  ?CL 99 103 102 102  ?CO2 '27 25 25 24  '$ ?GLUCOSE 167* 157* 87 129*  ?BUN '14 12 10 11  '$ ?CALCIUM 8.0* 7.9* 8.2* 8.6*  ?CREATININE 0.75 0.64 0.68 0.56  ?GFRNONAA >60 >60 >60 >60  ? ? ?LIVER FUNCTION TESTS: ?Recent Labs  ?  11/05/21 ?1255  ?BILITOT 0.7  ?AST 32  ?ALT 22  ?ALKPHOS 138*  ?PROT 6.0*  ?ALBUMIN 1.9*  ? ? ?Assessment and Plan: ?Perforated diverticulitis complicated by intra-abdominal fluid collection, s/p RUQ drain placement by Dr. Serafina Royals on 11/11/2021. ?  ?Pt sitting upright in recliner. She c/o diffuse abdominal pain. MD at bedside states she will order CT Abd. Pt denies pain at drain insertion site or with flushing.  ? ?RUQ drain intact with biodisk and sutures in place. Dressing is C/D/I. Drain flushed easily. Scant amt OP in JP with 70 cc documented OP in 24  hours.   ?WBC WNL.  ?VSS, afebrile. ?Will review CT for status of fluid collection.  ? ?Continue documenting OP in Epic q shift.  ?Continue flushing TID.  ?Change dressing q shift or as needed.  ?Please call IR if difficulty flushing or sudden change in output.  ? ? ?Electronically Signed: ?Tyson Alias, NP ?11/19/2021, 3:08 PM ? ? ?I spent a total of 15 Minutes at the the patient's bedside AND on the patient's hospital floor or unit, greater than 50% of which was counseling/coordinating care for Perforated diverticulitis complicated by intra-abdominal fluid collection.  ? ? ? ?  ?

## 2021-11-19 NOTE — Progress Notes (Addendum)
Pt reports pain has decreased significantly with IV toradol dose. Reviewed further plan of care for pain management with pt and sister at bedside. Both are agreeable. She currently reports a 4/10 pain; declines any further intervention at this time. ?

## 2021-11-19 NOTE — Progress Notes (Signed)
[facility] ?Progress Note ? ?5 Days Post-Op  ?Subjective: ?CC-  ?Up in chair. Sister at bedside. States that she still has a lot of abdominal pain. Pain medication helps but wears off too quickly. Denies n/v. Colostomy productive.  ?WBC 7.8, afebrile. ? ?Objective: ?Vital signs in last 24 hours: ?Temp:  [97.9 ?F (36.6 ?C)-98.6 ?F (37 ?C)] 98.1 ?F (36.7 ?C) (04/11 0751) ?Pulse Rate:  [95-112] 96 (04/11 0751) ?Resp:  [17-18] 17 (04/11 0751) ?BP: (121-137)/(74-82) 125/74 (04/11 0751) ?SpO2:  [94 %-97 %] 94 % (04/11 0751) ?Last BM Date : 11/17/21 ? ?Intake/Output from previous day: ?04/10 0701 - 04/11 0700 ?In: 465 [P.O.:240; IV Piggyback:200] ?Out: 61 [Drains:70] ?Intake/Output this shift: ?Total I/O ?In: 5 [Other:5] ?Out: -  ? ?PE: ?Gen:  Alert, NAD ?Abd: soft, nondistended, appropriately tender over incision, colostomy bag with brown stool and gas. Midline incision with some serous drainage, retention sutures intact, no cellulitis or purulent drainage ? ?Lab Results:  ?Recent Labs  ?  11/18/21 ?1434 11/19/21 ?0114  ?WBC 8.5 7.8  ?HGB 8.4* 8.0*  ?HCT 27.1* 25.7*  ?PLT 891* 779*  ? ?BMET ?Recent Labs  ?  11/17/21 ?0258 11/19/21 ?0114  ?NA 134* 135  ?K 4.4 4.6  ?CL 102 102  ?CO2 25 24  ?GLUCOSE 87 129*  ?BUN 10 11  ?CREATININE 0.68 0.56  ?CALCIUM 8.2* 8.6*  ? ?PT/INR ?No results for input(s): LABPROT, INR in the last 72 hours. ?CMP  ?   ?Component Value Date/Time  ? NA 135 11/19/2021 0114  ? K 4.6 11/19/2021 0114  ? CL 102 11/19/2021 0114  ? CO2 24 11/19/2021 0114  ? GLUCOSE 129 (H) 11/19/2021 0114  ? BUN 11 11/19/2021 0114  ? CREATININE 0.56 11/19/2021 0114  ? CALCIUM 8.6 (L) 11/19/2021 0114  ? PROT 6.0 (L) 11/05/2021 1255  ? ALBUMIN 1.9 (L) 11/05/2021 1255  ? AST 32 11/05/2021 1255  ? ALT 22 11/05/2021 1255  ? ALKPHOS 138 (H) 11/05/2021 1255  ? BILITOT 0.7 11/05/2021 1255  ? GFRNONAA >60 11/19/2021 0114  ? ?Lipase  ?   ?Component Value Date/Time  ? LIPASE 21 11/05/2021 1255   ? ? ? ? ? ?Studies/Results: ?No results found. ? ?Anti-infectives: ?Anti-infectives (From admission, onward)  ? ? Start     Dose/Rate Route Frequency Ordered Stop  ? 11/05/21 2100  piperacillin-tazobactam (ZOSYN) IVPB 3.375 g  Status:  Discontinued       ? 3.375 g ?12.5 mL/hr over 240 Minutes Intravenous Every 8 hours 11/05/21 2010 11/17/21 0932  ? 11/05/21 1500  piperacillin-tazobactam (ZOSYN) IVPB 3.375 g       ? 3.375 g ?100 mL/hr over 30 Minutes Intravenous  Once 11/05/21 1451 11/05/21 1600  ? ?  ? ? ? ?Assessment/Plan ?POD 14, s/p ex lap with Hartmann's procedure for perforated diverticulitis by Dr. 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  ?- s/p IR drain RUQ 4/3, serous and 70cc/24 hours, culture with e coli. Monitor drain ?- 4/6 intra op culture with e coli ?- continue midline wound care ?- having bowel function and tolerating soft diet ?- WOCN following for new ostomy ?- schedule tramadol for improved pain control ?  ?FEN: soft, ensure, SLIV ?ID: zosyn 3/30>4/8 ?VTE: lovenox ? ? ?  LOS: 14 days  ? ? ?Wellington Hampshire, PA-C ?[facility] ?11/19/2021, 11:09 AM ?Please see Amion for pager number during day hours 7:00am-4:30pm ? ?

## 2021-11-19 NOTE — Progress Notes (Signed)
Mobility Specialist Progress Note  ? ? 11/19/21 1631  ?Mobility  ?Activity Refused mobility  ? ?Pt stated she just got back to bed from moving around the room. Will f/u as schedule permits.  ? ?Hildred Alamin ?Mobility Specialist  ?  ?

## 2021-11-19 NOTE — Progress Notes (Signed)
GYN POST OP PROGRESS NOTE ? ?Michele [name] is a56 y.o.  ?[id number] ? ?Post Op Day # 25 from diagnostic laparoscopy / total abdominal hysterectomy.  ? ?POD #14 from Ex-Lap sigmoid colectomy colostomy and Hartmans ? ?Day #8 from IR placement of Terrial Rhodes drain ? ?POD #5 from Re-Exploratory laparotomy with abdominal cavity washout and drainage of intra-abdominal abscess with retention suture closure en mass of abdominal wall ? ?Subjective: ?Patient initially noted severe abdominal pain, characterized as diffuse in nature.  She denies nausea or vomiting. She denies fever or chills.  She is tolerating PO.  She has been ambulatory but notes due to the pain she hasn't been able to walk today.  ?Once Toradol was administered, patient stated that her pain markedly improved.   ? ?Objective: ?Vital signs in last 24 hours: ?Temp:  [97.9 ?F (36.6 ?C)-98.6 ?F (37 ?C)] 98.1 ?F (36.7 ?C) (04/11 0751) ?Pulse Rate:  [95-112] 96 (04/11 0751) ?Resp:  [17-18] 17 (04/11 0751) ?BP: (121-137)/(74-82) 125/74 (04/11 0751) ?SpO2:  [94 %-97 %] 94 % (04/11 0751) ? ? ?EXAM: ?General: upon first encounter patient appeared to be in distress from pain, however after medication she was calmer and more at ease ? ?Abdomen: Vertical midline incision with dressing present--clean dry ?Distended, non tender however, no rebound or guarding.  ? ?Pfannenstiel incision inspected and noted to be well healed, sealed, intact without evidence of infection (ie erythema, induration or warmth) ? ?GU: Pelvic exam deferred - no vaginal bleeding or discharge ? ? ?Assessment: ?Michele [name] is a pleasant 56 year old four week post op s/p TAH / BS.  Patient doing very well from a gynecologic standpoint.   ?Concerned about her pain regimen ? ?Plan: ?Discussed with on call MD the suggestion to consider adding scheduled ibuprofen once she transitions off the Toradol.  ?Also, perhaps to add an antiacid as well as Bentyl?  ? ?Appreciate the great multidisciplinary care.   ? ?Will sign off following patient during this hospitalization. However I am always available to discuss her case.  ? ? ? ? ?Rogelio Seen Ailyn Gladd,MD  ?11/19/2021 2:42 PM  ?

## 2021-11-19 NOTE — Progress Notes (Signed)
Nutrition Follow-up ? ?DOCUMENTATION CODES:  ?Obesity unspecified ? ?INTERVENTION:  ?Continue current diet as ordered, encourage PO intake as tolerated ?Continue Ensure Enlive po TID, each supplement provides 350 kcal and 20 grams of protein. ?1 packet Juven BID, each packet provides 95 calories, 2.5 grams of protein (collagen), + micronutrients for wound healing ?Chewable multivitamin daily for wound healing ? ?NUTRITION DIAGNOSIS:  ?Inadequate oral intake related to inability to eat as evidenced by NPO status. ?- remains applicable ? ?GOAL:  ?Patient will meet greater than or equal to 90% of their needs ?- progressing, diet in place, supplements in place ? ?MONITOR:  ?Diet advancement, Labs, I & O's, Weight trends ? ?REASON FOR ASSESSMENT:  ?NPO/Clear Liquid Diet, Other (Comment) (new colostomy) ?  ? ?ASSESSMENT:  ?56 year old female with hx of HTN, anxiety, and anemia presented from GYN office for decreased appetite, SOB, and redness to abdominal wall. CT showed significant for pneumoperitoneum with possible bowel perforation. ? ?Prior to admission: 3/18 - Op, ex lap with total abdominal hysterectomy with bilateral salpingectomy  ?3/28 - Op, ex lap with sigmoid colectomy, colostomy creation, and hartman's procedure.  ?3/29 - Wound vac placed ?4/5 - wound vac removed ?4/6 - Op, Ex lap with abdominal cavity washout, drainage of abdominal abscess, and retention suture closure  ? ?Pt resting in chair at the time of assessment, sister at bedside provides up date as pt is in too much pain. Reports that pain control has been an issue for several days and is inhibiting her PO. Pt is attempting to drink her ensure and juven as scheduled. Diet was advanced to soft yesterday. So far pt is tolerating the small amount she has eaten with no new nausea or worsening pain.  ? ?Nutritionally Relevant Medications: ?Scheduled Meds: ? feeding supplement  237 mL Oral TID WC  ? insulin aspart  0-9 Units Subcutaneous Q4H  ? nutrition  supplement (JUVEN)  1 packet Oral BID BM  ? potassium chloride  20 mEq Oral BID  ? prenatal vitamin w/FE, FA  1 tablet Oral Q1200  ? ?Labs Reviewed: ?CBG ranges from 103-170 mg/dL over the last 24 hours ?HgbA1c 6.6% (3/29) ? ?NUTRITION - FOCUSED PHYSICAL EXAM: ?Flowsheet Row Most Recent Value  ?Orbital Region No depletion  ?Upper Arm Region No depletion  ?Thoracic and Lumbar Region No depletion  ?Buccal Region No depletion  ?Temple Region No depletion  ?Clavicle Bone Region No depletion  ?Clavicle and Acromion Bone Region No depletion  ?Scapular Bone Region No depletion  ?Dorsal Hand No depletion  ?Patellar Region No depletion  ?Anterior Thigh Region No depletion  ?Posterior Calf Region No depletion  ?Edema (RD Assessment) None  ?Hair Reviewed  ?Eyes Reviewed  ?Mouth Reviewed  ?Skin Reviewed  ?Nails Reviewed  ? ? ?Diet Order:   ?Diet Order   ? ?       ?  DIET SOFT Room service appropriate? Yes; Fluid consistency: Thin  Diet effective now       ?  ? ?  ?  ? ?  ? ? ?EDUCATION NEEDS:  ?Education needs have been addressed ? ?Skin:  Skin Assessment: Reviewed RN Assessment (surgical incision (midline) with wound vac in place, new colostomy) ? ?Last BM:  4/9 - type 7 ? ?Height:  ?Ht Readings from Last 1 Encounters:  ?11/05/21 5' (1.524 m)  ? ? ?Weight:  ?Wt Readings from Last 1 Encounters:  ?11/17/21 98.9 kg  ? ? ?Ideal Body Weight:  45.4 kg ? ?BMI:  Body mass index is 42.58 kg/m?. ? ?Estimated Nutritional Needs:  ?Kcal:  1900 - 2100 ?Protein:  95 - 110 grams ?Fluid:  >/= 1.9 L ? ? ?Ranell Patrick, RD, LDN ?Clinical Dietitian ?RD pager # available in [place]  ?After hours/weekend pager # available in [place] ?

## 2021-11-20 ENCOUNTER — Inpatient Hospital Stay (HOSPITAL_COMMUNITY): Payer: BC Managed Care – PPO

## 2021-11-20 LAB — BASIC METABOLIC PANEL
Anion gap: 8 (ref 5–15)
BUN: 10 mg/dL (ref 6–20)
CO2: 27 mmol/L (ref 22–32)
Calcium: 8.5 mg/dL — ABNORMAL LOW (ref 8.9–10.3)
Chloride: 100 mmol/L (ref 98–111)
Creatinine, Ser: 0.55 mg/dL (ref 0.44–1.00)
GFR, Estimated: >60 mL/min (ref >60)
Glucose, Bld: 124 mg/dL — ABNORMAL HIGH (ref 70–99)
Potassium: 4.4 mmol/L (ref 3.5–5.1)
Sodium: 135 mmol/L (ref 135–145)

## 2021-11-20 LAB — CBC
HCT: 24.2 % — ABNORMAL LOW (ref 36.0–46.0)
Hemoglobin: 7.4 g/dL — ABNORMAL LOW (ref 12.0–15.0)
MCH: 25.2 pg — ABNORMAL LOW (ref 26.0–34.0)
MCHC: 30.6 g/dL (ref 30.0–36.0)
MCV: 82.3 fL (ref 80.0–100.0)
Platelets: 663 10*3/uL — ABNORMAL HIGH (ref 150–400)
RBC: 2.94 MIL/uL — ABNORMAL LOW (ref 3.87–5.11)
RDW: 15.9 % — ABNORMAL HIGH (ref 11.5–15.5)
WBC: 6.2 10*3/uL (ref 4.0–10.5)
nRBC: 0 % (ref 0.0–0.2)

## 2021-11-20 LAB — GLUCOSE, CAPILLARY
Glucose-Capillary: 116 mg/dL — ABNORMAL HIGH (ref 70–99)
Glucose-Capillary: 151 mg/dL — ABNORMAL HIGH (ref 70–99)
Glucose-Capillary: 108 mg/dL — ABNORMAL HIGH (ref 70–99)
Glucose-Capillary: 91 mg/dL (ref 70–99)
Glucose-Capillary: 196 mg/dL — ABNORMAL HIGH (ref 70–99)

## 2021-11-20 MED ORDER — IOHEXOL 300 MG/ML  SOLN
100.0000 mL | Freq: Once | INTRAMUSCULAR | Status: AC | PRN
  Administered 2021-11-20: 100 mL via INTRAVENOUS

## 2021-11-20 MED ORDER — IOHEXOL 9 MG/ML PO SOLN
ORAL | Status: AC
  Filled 2021-11-20: qty 1000

## 2021-11-20 NOTE — Progress Notes (Signed)
[facility] ?Progress Note ? ?6 Days Post-Op  ?Subjective: ?CC-  ?Sister at bedside. Up in chair. Pain much better controlled today than yesterday. She did require some dilaudid yesterday. Denies n/v. Tolerating diet but only eating about 30% of her meals. Taking juven and ensure. Colostomy productive. ? ?Objective: ?Vital signs in last 24 hours: ?Temp:  [97.4 ?F (36.3 ?C)-98.6 ?F (37 ?C)] 97.4 ?F (36.3 ?C) (04/12 0732) ?Pulse Rate:  [96-102] 100 (04/12 0732) ?Resp:  [17-18] 18 (04/12 0423) ?BP: (117-129)/(71-90) 122/71 (04/12 0732) ?SpO2:  [95 %-96 %] 96 % (04/12 0732) ?Last BM Date : 11/17/21 ? ?Intake/Output from previous day: ?04/11 0701 - 04/12 0700 ?In: 1150.7 [P.O.:100; IV Piggyback:1045.7] ?Out: -  ?Intake/Output this shift: ?No intake/output data recorded. ? ?PE: ?Gen:  Alert, NAD ?Abd: soft, nondistended, appropriately tender over incision, colostomy bag with brown stool and gas - unable to visualize stoma. Midline incision with some purulent drainage, retention sutures intact, no cellulitis  ? ?Lab Results:  ?Recent Labs  ?  11/18/21 ?1434 11/19/21 ?0114  ?WBC 8.5 7.8  ?HGB 8.4* 8.0*  ?HCT 27.1* 25.7*  ?PLT 891* 779*  ? ?BMET ?Recent Labs  ?  11/19/21 ?0114  ?NA 135  ?K 4.6  ?CL 102  ?CO2 24  ?GLUCOSE 129*  ?BUN 11  ?CREATININE 0.56  ?CALCIUM 8.6*  ? ?PT/INR ?No results for input(s): LABPROT, INR in the last 72 hours. ?CMP  ?   ?Component Value Date/Time  ? NA 135 11/19/2021 0114  ? K 4.6 11/19/2021 0114  ? CL 102 11/19/2021 0114  ? CO2 24 11/19/2021 0114  ? GLUCOSE 129 (H) 11/19/2021 0114  ? BUN 11 11/19/2021 0114  ? CREATININE 0.56 11/19/2021 0114  ? CALCIUM 8.6 (L) 11/19/2021 0114  ? PROT 6.0 (L) 11/05/2021 1255  ? ALBUMIN 1.9 (L) 11/05/2021 1255  ? AST 32 11/05/2021 1255  ? ALT 22 11/05/2021 1255  ? ALKPHOS 138 (H) 11/05/2021 1255  ? BILITOT 0.7 11/05/2021 1255  ? GFRNONAA >60 11/19/2021 0114  ? ?Lipase  ?   ?Component Value Date/Time  ? LIPASE 21 11/05/2021 1255   ? ? ? ? ? ?Studies/Results: ?No results found. ? ?Anti-infectives: ?Anti-infectives (From admission, onward)  ? ? Start     Dose/Rate Route Frequency Ordered Stop  ? 11/05/21 2100  piperacillin-tazobactam (ZOSYN) IVPB 3.375 g  Status:  Discontinued       ? 3.375 g ?12.5 mL/hr over 240 Minutes Intravenous Every 8 hours 11/05/21 2010 11/17/21 0932  ? 11/05/21 1500  piperacillin-tazobactam (ZOSYN) IVPB 3.375 g       ? 3.375 g ?100 mL/hr over 30 Minutes Intravenous  Once 11/05/21 1451 11/05/21 1600  ? ?  ? ? ? ?Assessment/Plan ?-POD#15, s/p ex lap with Hartmann's procedure for perforated diverticulitis by Dr. 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  ?- s/p IR drain RUQ 4/3, serous and 70cc/24 hours, culture with e coli. Monitor drain ?- 4/6 intra op culture with e coli ?- labs pending, afebrile, VSS ?- having some purulent drainage from midline wound wound. Increase dressing changes to TID. CT scan today. ?- having bowel function and tolerating soft diet but not taking in a lot. Encouraged protein shakes ?- WOCN following for new ostomy ?- pain control improved with tramadol and Toradol. Wean  dilaudid and use more oxy today ?  ?FEN: soft, ensure, SLIV ?ID: zosyn 3/30>4/8 ?VTE: lovenox ? ? ? LOS: 15 days  ? ? ?Wellington Hampshire, PA-C ?[facility] ?11/20/2021, 9:26 AM ?Please see Amion for pager number during day hours 7:00am-4:30pm ? ?

## 2021-11-20 NOTE — Progress Notes (Signed)
Pt made NPO for CT Abd. [place] in [place] notified that last time pt ate was at 0630 per pt report. Oral contrst to be delivered to unit ?

## 2021-11-20 NOTE — Progress Notes (Signed)
Oral contrast delivered. Pt given instructions for drinking oral contrast and is agreeable to plan. ?

## 2021-11-20 NOTE — Consult Note (Signed)
[name] Nurse ostomy follow up ?Stoma type/location: LLQ, end colostomy ?Treatment options for stomal/peristomal skin: using 2" skin barrier ring ?Output liquid brown ?Ostomy pouching: 1pc. Flex convex with 2" skin barrier ?Education provided: planning teaching visit, however when I arrived she is preparing for CT. She is tired and needs to drink CT contrast.  Pain is better controlled. She would like to change pouch in the am if possible.  Since she is leave for procedure soon.  Sister at bedside.  They are in agreement for pouch change in the am.  ?Enrolled patient in [facility] Start Discharge program: Yes ? ?Michele [name] [facility], CNS, CWON-AP ?[phone]  ?

## 2021-11-20 NOTE — Progress Notes (Signed)
Pt declined dressing change at this time. Would like to coordinate with 1800 scheduled medications. Pt verbalizes understanding that dressing change is to be TID per MD order. ?

## 2021-11-20 NOTE — Evaluation (Signed)
Occupational Therapy Evaluation ?Patient Details ?Name: Michele [name] ?MRN: [id number] ?DOB: 1966-01-31 ?Today's Date: 11/20/2021 ? ? ?History of Present Illness Pt is a 56 y.o. admitted from GYN office on 11/05/21 with SOB and abdominal wall redness 10-days s/p open hysterectomy (10/25/21). Abdominal CT showed significant pneumoperitoneum with possible bowel perforation. S/p ex lap, sigmoid colectomy, and colostomy on 3/28. S/p RUQ subhepatic drain placement 4/3. Pt with wound dehiscence s/p ex lap with abdominal cavity washout and drainage of intra-abdominal abscess, abdominal wall closure 4/6. PMH includes anxiety, anemia, HTN.  ? ?Clinical Impression ?  ?Pt is typically independent. Presents with abdominal pain limiting mobility and ability to complete ADLs, particularly LB, without assistance. Pt ambulated with supervision and toileted modified independently. Pt requires set up to max assist for ADLs. Will follow acutely. ?   ? ?Recommendations for follow up therapy are one component of a multi-disciplinary discharge planning process, led by the attending physician.  Recommendations may be updated based on patient status, additional functional criteria and insurance authorization.  ? ?Follow Up Recommendations ? No OT follow up  ?  ?Assistance Recommended at Discharge None  ?Patient can return home with the following A little help with walking and/or transfers;Assistance with cooking/housework;Assist for transportation;Help with stairs or ramp for entrance;A lot of help with bathing/dressing/bathroom ? ?  ?Functional Status Assessment ? Patient has had a recent decline in their functional status and demonstrates the ability to make significant improvements in function in a reasonable and predictable amount of time.  ?Equipment Recommendations ? BSC/3in1  ?  ?Recommendations for Other Services   ? ? ?  ?Precautions / Restrictions Precautions ?Precautions: Fall;Other (comment) ?Precaution Comments: abdominal  precautions for comfort, colostomy, RUQ drain ?Required Braces or Orthoses: Other Brace (abdominal binder) ?Restrictions ?Weight Bearing Restrictions: No  ? ?  ? ?Mobility Bed Mobility ?  ?  ?  ?  ?  ?  ?  ?General bed mobility comments: Received sitting in recliner ?  ? ?Transfers ?Overall transfer level: Modified independent ?Equipment used: None ?  ?  ?  ?  ?  ?  ?  ?General transfer comment: slow to rise ?  ? ?  ?Balance Overall balance assessment: Needs assistance ?  ?Sitting balance-Leahy Scale: Fair ?  ?  ?  ?Standing balance-Leahy Scale: Fair ?  ?  ?  ?  ?  ?  ?  ?  ?  ?  ?  ?  ?   ? ?ADL either performed or assessed with clinical judgement  ? ?ADL Overall ADL's : Needs assistance/impaired ?Eating/Feeding: Independent ?  ?Grooming: Wash/dry hands;Supervision/safety;Standing ?  ?Upper Body Bathing: Minimal assistance;Sitting ?  ?Lower Body Bathing: Maximal assistance;Sit to/from stand ?  ?Upper Body Dressing : Minimal assistance;Sitting ?  ?Lower Body Dressing: Maximal assistance;Sit to/from stand ?  ?Toilet Transfer: Supervision/safety;Ambulation;BSC/3in1 ?  ?Toileting- Clothing Manipulation and Hygiene: Modified independent ?  ?  ?  ?Functional mobility during ADLs: Supervision/safety ?   ? ? ? ?Vision Baseline Vision/History: 0 No visual deficits ?Ability to See in Adequate Light: 0 Adequate ?   ?   ?Perception   ?  ?Praxis   ?  ? ?Pertinent Vitals/Pain Pain Assessment ?Pain Assessment: Faces ?Faces Pain Scale: Hurts whole lot ?Pain Location: abdomen ?Pain Descriptors / Indicators: Sore, Discomfort ?Pain Intervention(s): Monitored during session, Repositioned, Premedicated before session  ? ? ? ?Hand Dominance Right ?  ?Extremity/Trunk Assessment Upper Extremity Assessment ?Upper Extremity Assessment: Overall WFL for tasks assessed ?  ?Lower Extremity  Assessment ?Lower Extremity Assessment: Defer to PT evaluation ?  ?Cervical / Trunk Assessment ?Cervical / Trunk Assessment: Other exceptions ?Cervical /  Trunk Exceptions: abd surgery, new colostomy ?  ?Communication Communication ?Communication: No difficulties ?  ?Cognition Arousal/Alertness: Awake/alert ?Behavior During Therapy: Anxious ?Overall Cognitive Status: Within Functional Limits for tasks assessed ?  ?  ?  ?  ?  ?  ?  ?  ?  ?  ?  ?  ?  ?  ?  ?  ?  ?  ?  ?General Comments  reviewed abdominal precautions for comfort. Sister present and supportive ? ?  ?Exercises   ?  ?Shoulder Instructions    ? ? ?Home Living Family/patient expects to be discharged to:: Private residence ?Living Arrangements: Children (60 and 70 year old) ?Available Help at Discharge: Family;Available PRN/intermittently ?Type of Home: House ?Home Access: Level entry ?  ?  ?Home Layout: One level ?  ?  ?Bathroom Shower/Tub: Walk-in shower ?  ?Bathroom Toilet: Standard ?  ?  ?Home Equipment: Shower seat ?  ?Additional Comments: has adjustable bed and electric recliner ?  ? ?  ?Prior Functioning/Environment Prior Level of Function : Independent/Modified Independent ?  ?  ?  ?  ?  ?  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  ?  ? ?  ?  ?OT Problem List: Decreased activity tolerance;Pain ?  ?   ?OT Treatment/Interventions: Self-care/ADL training;DME and/or AE instruction;Therapeutic activities;Patient/family education;Balance training  ?  ?OT Goals(Current goals can be found in the care plan section) Acute Rehab OT Goals ?OT Goal Formulation: With patient ?Time For Goal Achievement: 12/04/21 ?Potential to Achieve Goals: Good  ?OT Frequency: Min 2X/week ?  ? ?Co-evaluation   ?  ?  ?  ?  ? ?  ?AM-PAC OT "6 Clicks" Daily Activity     ?Outcome Measure Help from another person eating meals?: None ?Help from another person taking care of personal grooming?: A Little ?Help from another person toileting, which includes using toliet, bedpan, or urinal?: A Little ?Help from another person bathing (including washing, rinsing, drying)?: A Lot ?Help from  another person to put on and taking off regular upper body clothing?: A Little ?Help from another person to put on and taking off regular lower body clothing?: A Lot ?6 Click Score: 17 ?  ?End of Session   ? ?Activity Tolerance: Patient tolerated treatment well ?Patient left: in chair;with call bell/phone within reach;with family/visitor present ? ?OT Visit Diagnosis: Pain  ?              ?Time: 7622-6333 ?OT Time Calculation (min): 39 min ?Charges:  OT General Charges ?$OT Visit: 1 Visit ?OT Evaluation ?$OT Eval Moderate Complexity: 1 Mod ?OT Treatments ?$Self Care/Home Management : 8-22 mins ? ?Nestor Lewandowsky, OTR/L ?Acute Rehabilitation Services ?Pager: [phone] ?Office: [phone]  ? ?[name] ?11/20/2021, 1:37 PM ?

## 2021-11-20 NOTE — Evaluation (Signed)
Physical Therapy Evaluation & Discharge ?Patient Details ?Name: Michele [name] ?MRN: [id number] ?DOB: [birth year] ?Today's Date: 11/20/2021 ? ?History of Present Illness ? Pt is a 56 y.o. admitted from GYN office on 11/05/21 with SOB and abdominal wall redness 10-days s/p open hysterectomy (10/25/21). Abdominal CT showed significant pneumoperitoneum with possible bowel perforation. S/p ex lap, sigmoid colectomy, and colostomy on 3/28. S/p RUQ subhepatic drain placement 4/3. Pt with wound dehiscence s/p ex lap with abdominal cavity washout and drainage of intra-abdominal abscess, abdominal wall closure 4/6. PMH includes anxiety, anemia, HTN. ?  ?Clinical Impression ? Patient evaluated by Physical Therapy with no further acute PT needs identified. PTA, pt independent, working, lives with older children. Today, pt mobilizing self to bathroom without assist; declined further distance secondary to pain and "stomach feeling weird." Pt requires assist for ADL tasks. Reviewed abdominal precautions and activity recommendations. Encouraged more frequent ambulation with mobility specialists and family. All education has been completed and the patient has no further questions. Acute PT is signing off. Thank you for this referral.    ? ?Recommendations for follow up therapy are one component of a multi-disciplinary discharge planning process, led by the attending physician.  Recommendations may be updated based on patient status, additional functional criteria and insurance authorization. ? ?Follow Up Recommendations No PT follow up ? ?  ?Assistance Recommended at Discharge Intermittent Supervision/Assistance  ?Patient can return home with the following ? A little help with bathing/dressing/bathroom;Assistance with cooking/housework ? ?  ?Equipment Recommendations Rolling walker (2 wheels);BSC/3in1  ?Recommendations for Other Services ?    ?  ?Functional Status Assessment    ? ?  ?Precautions / Restrictions  Precautions ?Precautions: Fall;Other (comment) ?Precaution Comments: abdominal precautions for comfort, colostomy, RUQ drain ?Restrictions ?Weight Bearing Restrictions: No  ? ?  ? ?Mobility ? Bed Mobility ?  ?  ?  ?  ?  ?  ?  ?General bed mobility comments: Received sittin gin recliner ?  ? ?Transfers ?Overall transfer level: Independent ?Equipment used: None ?Transfers: Sit to/from Stand ?  ?  ?  ?  ?  ?  ?General transfer comment: increased time and effort secondary to abdominal pain ?  ? ?Ambulation/Gait ?Ambulation/Gait assistance: Modified independent (Device/Increase time) ?Gait Distance (Feet): 24 Feet ?Assistive device: None ?Gait Pattern/deviations: Step-through pattern, Decreased stride length, Antalgic ?Gait velocity: Decreased ?  ?  ?General Gait Details: slow, guarded gait without DME, increased time due to abdominal pain; pt declined further distance secondary to pain ? ?Stairs ?  ?  ?  ?  ?  ? ?Wheelchair Mobility ?  ? ?Modified Rankin (Stroke Patients Only) ?  ? ?  ? ?Balance Overall balance assessment: Needs assistance ?Sitting-balance support: No upper extremity supported, Feet supported ?Sitting balance-Leahy Scale: Good ?  ?  ?Standing balance support: No upper extremity supported, During functional activity ?Standing balance-Leahy Scale: Good ?  ?  ?  ?  ?  ?  ?  ?  ?  ?  ?  ?  ?   ? ? ? ?Pertinent Vitals/Pain Pain Assessment ?Pain Assessment: Faces ?Faces Pain Scale: Hurts even more ?Pain Location: abdomen ?Pain Descriptors / Indicators: Sore, Discomfort ?Pain Intervention(s): Limited activity within patient's tolerance, Monitored during session  ? ? ?Home Living Family/patient expects to be discharged to:: Private residence ?Living Arrangements: Children ?Available Help at Discharge: Family;Available PRN/intermittently ?Type of Home: House ?Home Access: Level entry ?  ?  ?  ?Home Layout: One level ?Home Equipment: Shower seat ?Additional Comments: has  adjustable bed and electric recliner  ?   ?Prior Function Prior Level of Function : Independent/Modified Independent ?  ?  ?  ?  ?  ?  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  ?  ? ? ?Hand Dominance  ? Dominant Hand: Right ? ?  ?Extremity/Trunk Assessment  ? Upper Extremity Assessment ?Upper Extremity Assessment: Overall WFL for tasks assessed ?  ? ?Lower Extremity Assessment ?Lower Extremity Assessment: Overall WFL for tasks assessed ?  ? ?Cervical / Trunk Assessment ?Cervical / Trunk Assessment: Other exceptions ?Cervical / Trunk Exceptions: abd surgery  ?Communication  ? Communication: No difficulties  ?Cognition Arousal/Alertness: Awake/alert ?Behavior During Therapy: [facility] for tasks assessed/performed, Anxious ?Overall Cognitive Status: Within Functional Limits for tasks assessed ?  ?  ?  ?  ?  ?  ?  ?  ?  ?  ?  ?  ?  ?  ?  ?  ?General Comments: anxious related to pain ?  ?  ? ?  ?General Comments General comments (skin integrity, edema, etc.): reviewed abdominal precautions for comfort. Sister present and supportive ? ?  ?Exercises    ? ?Assessment/Plan  ?  ?PT Assessment Patient does not need any further PT services  ?PT Problem List   ? ?   ?  ?PT Treatment Interventions DME instruction;Therapeutic activities;Gait training;Patient/family education;Therapeutic exercise;Balance training;Functional mobility training   ? ?PT Goals (Current goals can be found in the Care Plan section)  ?Acute Rehab PT Goals ?PT Goal Formulation: All assessment and education complete, DC therapy ? ?  ?Frequency Min 3X/week ?  ? ? ?Co-evaluation   ?  ?  ?  ?  ? ? ?  ?AM-PAC PT "6 Clicks" Mobility  ?Outcome Measure Help needed turning from your back to your side while in a flat bed without using bedrails?: None ?Help needed moving from lying on your back to sitting on the side of a flat bed without using bedrails?: None ?Help needed moving to and from a bed to a chair (including a wheelchair)?: None ?Help needed standing  up from a chair using your arms (e.g., wheelchair or bedside chair)?: None ?Help needed to walk in hospital room?: None ?Help needed climbing 3-5 steps with a railing? : A Little ?6 Click Score: 23 ? ?  ?End of Session   ?Activity Tolerance: Patient limited by pain ?Patient left: in chair;with call bell/phone within reach;with family/visitor present ?Nurse Communication: Mobility status ?PT Visit Diagnosis: Other abnormalities of gait and mobility (R26.89);Difficulty in walking, not elsewhere classified (R26.2);Pain ?  ? ?Time: 7341-9379 ?PT Time Calculation (min) (ACUTE ONLY): 28 min ? ? ?Charges:   PT Evaluation ?$PT Eval Moderate Complexity: 1 Mod ?  ?  ?   ?Mabeline Caras, PT, DPT ?Acute Rehabilitation Services  ?Pager [phone] ?Office [phone] ? ?Michele [name] ?11/20/2021, 12:03 PM ? ?

## 2021-11-21 LAB — CBC
HCT: 25 % — ABNORMAL LOW (ref 36.0–46.0)
Hemoglobin: 7.6 g/dL — ABNORMAL LOW (ref 12.0–15.0)
MCH: 24.8 pg — ABNORMAL LOW (ref 26.0–34.0)
MCHC: 30.4 g/dL (ref 30.0–36.0)
MCV: 81.4 fL (ref 80.0–100.0)
Platelets: 635 10*3/uL — ABNORMAL HIGH (ref 150–400)
RBC: 3.07 MIL/uL — ABNORMAL LOW (ref 3.87–5.11)
RDW: 15.7 % — ABNORMAL HIGH (ref 11.5–15.5)
WBC: 6.6 10*3/uL (ref 4.0–10.5)
nRBC: 0 % (ref 0.0–0.2)

## 2021-11-21 LAB — GLUCOSE, CAPILLARY
Glucose-Capillary: 134 mg/dL — ABNORMAL HIGH (ref 70–99)
Glucose-Capillary: 141 mg/dL — ABNORMAL HIGH (ref 70–99)
Glucose-Capillary: 141 mg/dL — ABNORMAL HIGH (ref 70–99)
Glucose-Capillary: 210 mg/dL — ABNORMAL HIGH (ref 70–99)
Glucose-Capillary: 78 mg/dL (ref 70–99)
Glucose-Capillary: 81 mg/dL (ref 70–99)

## 2021-11-21 NOTE — TOC Progression Note (Signed)
Transition of Care (TOC) - Progression Note  ? ? ?Patient Details  ?Name: Michele [name] ?MRN: [id number] ?Date of Birth: 1966-02-02 ? ?Transition of Care (TOC) CM/SW Contact  ?Verdell Carmine, RN ?Phone Number: ?11/21/2021, 9:51 AM ? ?Clinical Narrative:    ?Patient requesting  information on out of pocket services  for PCS. List compiled and brought to room, d/w sister as patient was receiving teaching.  Called Hoyle Sauer for [facility] to update her on patient, patient with tentative DC 4/14. They do not provide out of pocket PCS services. [place] RN orders in.  ? ? ?Expected Discharge Plan: [place] ?Barriers to Discharge: Continued Medical Work up ? ?Expected Discharge Plan and Services ?Expected Discharge Plan: [place] ?  ?Discharge Planning Services: CM Consult ?Post Acute Care Choice: Home Health ?Living arrangements for the past 2 months: [place] ?                ?  ?  ?  ?  ?  ?HH Arranged: RN ?  ?  ?  ?  ? ? ?Social Determinants of Health (SDOH) Interventions ?  ? ?Readmission Risk Interventions ?   ? View : No data to display.  ?  ?  ?  ? ? ?

## 2021-11-21 NOTE — Plan of Care (Signed)
?  Problem: Education: ?Goal: Knowledge of General Education information will improve ?Description: Including pain rating scale, medication(s)/side effects and non-pharmacologic comfort measures ?Outcome: Progressing ?  ?Problem: Health Behavior/Discharge Planning: ?Goal: Ability to manage health-related needs will improve ?Outcome: Progressing ?  ?Problem: Activity: ?Goal: Risk for activity intolerance will decrease ?Outcome: Progressing ?  ?Problem: Nutrition: ?Goal: Adequate nutrition will be maintained ?Outcome: Progressing ?  ?Problem: Coping: ?Goal: Level of anxiety will decrease ?Outcome: Progressing ?  ?Problem: Elimination: ?Goal: Will not experience complications related to bowel motility ?Outcome: Progressing ?Goal: Will not experience complications related to urinary retention ?Outcome: Progressing ?  ?Problem: Pain Managment: ?Goal: General experience of comfort will improve ?Outcome: Progressing ?  ?Problem: Safety: ?Goal: Ability to remain free from injury will improve ?Outcome: Progressing ?  ?Problem: Skin Integrity: ?Goal: Risk for impaired skin integrity will decrease ?Outcome: Progressing ?  ?Problem: Education: ?Goal: Knowledge of ostomy care will improve ?Outcome: Progressing ?Goal: Understanding of discharge needs will improve ?Outcome: Progressing ?  ?Problem: Coping: ?Goal: Coping ability will improve ?Outcome: Progressing ?  ?

## 2021-11-21 NOTE — Consult Note (Signed)
[place] Nurse ostomy follow up ?Stoma type/location: LLQ, end colostomy  ?Stomal assessment/size: 1 3/8" x 1 1/2" oval, stoma is flush, some necrotic tissue centrally (small area).  ?Peristomal assessment: intact, drainage from midline on the tape border; will discuss with surgery ?Treatment options for stomal/peristomal skin: 2" ostomy barrier ring ?Output liquid green ?Ostomy pouching: 1pc. Flex convex with 2" skin barrier ring ?Education provided:  ?Met with patient and her sister, however her sister will be returning to [place] at some point. ?Sister while engaged from a distance is supportive. Patient is anxious and has slept in the chair, feels like she can't catch her breath when she lays flat from pain. We discussed the need too be able to empty her pouch and ways to do this, she will need to stand in front of the toilet to be most successful. I have reviewed educational materials, marked items she is using in [employer] as well as discussion on 2pc flex convex which may be better for her to not have the viewing window.  ?Performed pouch change with patient assistance, she cut new skin barrier, assisted with placement of ostomy skin barrier ring, she is not able to place new pouch because of limited vision.  She is somewhat independent with lock and roll closure and cleaning the spout, however her limited endurance "self reported" she has not been able to perform this task independently.  We discussed pouch changes 2x week and she can coordinate with [facility] for next pouch change should she go home.  She is aware [place] nurse will see her again on Monday.  ?I did cut a 2pc and this made the use of 2pc flex convex a little more understandable to her.  She has 7 soft convex pouches and barrier rings in the room for use as well as a belt which we did not use yet but I have demonstrated use should she need in the future.   ?I contacted the surgical PA related to the soupy foul smelling drainage from the midline. CT  yesterday has been reviewed by CCS, no concerns over drainage at current state, want to continue saline dressing changes TID.   ?Provided ostomy clinic information to sister.  ?[place] Nurse will follow along with you for continued support with ostomy teaching and care ?Veleda Mun Liane Comber MSN, RN, [place], [place], [place] ?[phone]  ?Enrolled patient in [place] Start Discharge program: Yes ?

## 2021-11-21 NOTE — Progress Notes (Signed)
[facility] ?Progress Note ? ?7 Days Post-Op  ?Subjective: ?CC-  ?Up in chair. Sister at bedside. Pain control improved but still required dilaudid twice yesterday, only took oxycodone once. Tolerating diet but still not eating a lot. Denies n/v. Colostomy productive.  ?CT yesterday shows improving subhepatic fluid collection with drain in place, persistent perisplenic fluid collection, improving subdiaphragmatic and undersurface of the ?anterior abdominal wall fluid collections. ?Very worried about going home. Will not have much support when she leaves.  ? ?Objective: ?Vital signs in last 24 hours: ?Temp:  [98.1 ?F (36.7 ?C)-99.1 ?F (37.3 ?C)] 98.4 ?F (36.9 ?C) (04/13 3149) ?Pulse Rate:  [90-110] 90 (04/13 0743) ?Resp:  [16-17] 16 (04/13 0743) ?BP: (113-127)/(64-73) 113/64 (04/13 0743) ?SpO2:  [93 %-100 %] 95 % (04/13 0743) ?Last BM Date : 11/20/21 ? ?Intake/Output from previous day: ?04/12 0701 - 04/13 0700 ?In: 10  ?Out: 30 [Drains:30] ?Intake/Output this shift: ?No intake/output data recorded. ? ?PE: ?Gen:  Alert, NAD ?Abd: soft, nondistended, appropriately tender over incision, colostomy bag with brown stool and gas - unable to visualize stoma. Midline incision with some purulent drainage, retention sutures intact, no cellulitis. IR drain with purulent fluid in bulb ? ?Lab Results:  ?Recent Labs  ?  11/20/21 ?7026 11/21/21 ?0116  ?WBC 6.2 6.6  ?HGB 7.4* 7.6*  ?HCT 24.2* 25.0*  ?PLT 663* 635*  ? ?BMET ?Recent Labs  ?  11/19/21 ?0114 11/20/21 ?0915  ?NA 135 135  ?K 4.6 4.4  ?CL 102 100  ?CO2 24 27  ?GLUCOSE 129* 124*  ?BUN 11 10  ?CREATININE 0.56 0.55  ?CALCIUM 8.6* 8.5*  ? ?PT/INR ?No results for input(s): LABPROT, INR in the last 72 hours. ?CMP  ?   ?Component Value Date/Time  ? NA 135 11/20/2021 0915  ? K 4.4 11/20/2021 0915  ? CL 100 11/20/2021 0915  ? CO2 27 11/20/2021 0915  ? GLUCOSE 124 (H) 11/20/2021 0915  ? BUN 10 11/20/2021 0915  ? CREATININE 0.55 11/20/2021 0915  ? CALCIUM 8.5 (L)  11/20/2021 0915  ? PROT 6.0 (L) 11/05/2021 1255  ? ALBUMIN 1.9 (L) 11/05/2021 1255  ? AST 32 11/05/2021 1255  ? ALT 22 11/05/2021 1255  ? ALKPHOS 138 (H) 11/05/2021 1255  ? BILITOT 0.7 11/05/2021 1255  ? GFRNONAA >60 11/20/2021 0915  ? ?Lipase  ?   ?Component Value Date/Time  ? LIPASE 21 11/05/2021 1255  ? ? ? ? ? ?Studies/Results: ?CT ABDOMEN PELVIS W CONTRAST ? ?Result Date: 11/20/2021 ?CLINICAL DATA:  Follow-up intra-abdominal abscess. EXAM: CT ABDOMEN AND PELVIS WITH CONTRAST TECHNIQUE: Multidetector CT imaging of the abdomen and pelvis was performed using the standard protocol following bolus administration of intravenous contrast. RADIATION DOSE REDUCTION: This exam was performed according to the departmental dose-optimization program which includes automated exposure control, adjustment of the mA and/or kV according to patient size and/or use of iterative reconstruction technique. CONTRAST:  129m OMNIPAQUE IOHEXOL 300 MG/ML  SOLN COMPARISON:  CT 11/10/2021, 11/05/2021 FINDINGS: Lower chest: Decreasing right pleural effusion with small residual. Right lower and middle lobe atelectasis. Left pleural effusion is similar with adjacent left basilar atelectasis. Hepatobiliary: No focal hepatic lesion. Gallbladder physiologically distended, no calcified stone. No biliary dilatation. Perihepatic fluid collections will be described below. Pancreas: No ductal dilatation or inflammation. Spleen: Peri splenic fluid collection which may be subcapsular with slight deformity of the splenic contour. This has increased in size from prior exam currently measuring 12.7 x 10.9 x 12.2 cm with some peripheral enhancement.  The spleen is otherwise unremarkable. Adrenals/Urinary Tract: No adrenal nodule. No hydronephrosis or perinephric edema. Homogeneous renal enhancement with symmetric excretion on delayed phase imaging. Urinary bladder is physiologically distended without wall thickening. Stomach/Bowel: Left upper quadrant fluid  collection causes mild mass effect on the proximal stomach. The stomach is otherwise decompressed. No small bowel obstruction or ileus. Administered enteric contrast reaches the splenic flexure of the colon. Partial left colectomy with descending colostomy, no peristomal hernia. Stapled off sigmoid colon. Resolved colonic inflammation just proximal to the ostomy. Vascular/Lymphatic: Normal caliber abdominal aorta. Patent portal, splenic, and superior mesenteric veins. No suspicious abdominopelvic adenopathy. Reproductive: Hysterectomy.  No adnexal mass. Other: The previous subhepatic fluid collection has resolved with drainage catheter in place. Minimal ill-defined fluid foci of gas persist adjacent to the catheter. Crescent[place] right subdiaphragmatic fluid collection has diminished in size from prior exam currently measuring 7.3 x 3 x 2.5 cm, small focus of gas persists. Left upper quadrant perisplenic collection has increased currently measuring 12.8 x 10.9 x 12.2 cm with peripheral enhancement. A small medial component tracks adjacent to the stomach. There additional smaller fluid collections including a thin elongated collection along the undersurface of the upper anterior abdominal wall. This is diminished in size from prior exam, maximal thickness 10 mm. Small residual interloop fluid collection in the right lower quadrant has decreased in size, currently approximately 11 x 23 mm. Soft tissue gas and edema of the anterior abdominal wall improving, no discrete abdominal wall drainable collection. Musculoskeletal: There are no acute or suspicious osseous abnormalities. IMPRESSION: 1. Decreased size of previous subhepatic fluid collection with drainage catheter in place, only small amount of non organized fluid and air persists. 2. Increased size of left upper quadrant perisplenic fluid collection currently measuring 12.8 x 10.9 x 12.2 cm with some peripheral enhancement. This may be subcapsular with slight  deformity of the splenic contour. 3. Decreased size of right subdiaphragmatic fluid collection currently measuring 7.3 x 3 x 2.5 cm. 4. Smaller fluid collections involving the undersurface of the anterior abdominal wall and interloop collection in the right lower quadrant have decreased in size. 5. Decreased right pleural effusion with small residual. Unchanged left pleural effusion with adjacent atelectasis. Electronically Signed   By: Keith Rake M.D.   On: 11/20/2021 15:37   ? ?Anti-infectives: ?Anti-infectives (From admission, onward)  ? ? Start     Dose/Rate Route Frequency Ordered Stop  ? 11/05/21 2100  piperacillin-tazobactam (ZOSYN) IVPB 3.375 g  Status:  Discontinued       ? 3.375 g ?12.5 mL/hr over 240 Minutes Intravenous Every 8 hours 11/05/21 2010 11/17/21 0932  ? 11/05/21 1500  piperacillin-tazobactam (ZOSYN) IVPB 3.375 g       ? 3.375 g ?100 mL/hr over 30 Minutes Intravenous  Once 11/05/21 1451 11/05/21 1600  ? ?  ? ? ? ?Assessment/Plan ?-POD#16, s/p ex lap with Hartmann's procedure for perforated diverticulitis by Dr. 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  ?- s/p IR drain RUQ 4/3, serous and 30cc/24 hours, culture with e coli. Monitor drain ?- 4/6 intra op culture with e coli ?- CT 4/12 shows improving subhepatic fluid collection with drain in place, persistent perisplenic fluid collection, improving subdiaphragmatic and undersurface of the ?anterior abdominal wall fluid collections ?>>currently no systemic signs of infection given  normal WBC and no fevers, favor LUQ fluid collection is a hematoma ?- some persistent purulent drainage from midline wound wound, continue dressing changes to TID and PRN saturation. Patient will practice performing wound care today ?- having bowel function and  tolerating soft diet but not taking in a lot. Encouraged protein shakes ?- WOCN following for new ostomy ?- PT has signed off. Will ask mobility tech to see. ?- pain control still an issue. Continue scheduled tramadol

## 2021-11-21 NOTE — Progress Notes (Signed)
Mobility Specialist Progress Note: ? ? 11/21/21 1600  ?Mobility  ?Activity Ambulated with assistance in hallway  ?Level of Assistance Independent after set-up  ?Assistive Device Front wheel walker  ?Distance Ambulated (ft) 570 ft  ?Activity Response Tolerated well  ?$Mobility charge 1 Mobility  ? ?Pt received in chair willing to participate in mobility. Complaints of pain in upper back and L side by breast. Left in chair with call bell in reach and all needs met.   ? ?Karry Causer ?Mobility Specialist ?Primary Phone [phone] ? ?

## 2021-11-21 NOTE — Progress Notes (Signed)
? ? ?Referring Physician(s): Ralene Ok  ? ?Supervising Physician: Michaelle Birks ? ?Patient Status:  [facility] - In-pt ? ?Chief Complaint: ? ?S/p ex lap for perforated diverticulitis with colectomy and colostomy creation on 0/17, recovery complicated by intra-abdominal fluid collection development, s/p RUQ drain placement y Dr. Serafina Royals on 11/11/21.  ? ?Subjective: ? ?Patient laying in bed not in acute distress. ?States that she is feeling okay today, denies abdominal pain, nausea, vomiting, fever, chills. ? ?Allergies: ?Lipitor [atorvastatin] and Sulfa antibiotics ? ?Medications: ?Prior to Admission medications   ?Medication Sig Start Date End Date Taking? Authorizing Provider  ?ADDERALL XR 30 MG 24 hr capsule Take 30 mg by mouth daily. 09/23/21  Yes [provider]  ?citalopram (CELEXA) 20 MG tablet Take 20 mg by mouth daily.   Yes [provider]  ?CRESTOR 5 MG tablet TAKE 1 TABLET BY MOUTH EVERY DAY ?Patient taking differently: Take 5 mg by mouth daily. 04/17/15  Yes Dorothy Spark, MD  ?ibuprofen (ADVIL) 600 MG tablet Take 1 tablet (600 mg total) by mouth every 6 (six) hours as needed for cramping or moderate pain. 10/28/21  Yes Sanjuana Kava, MD  ?oxyCODONE-acetaminophen (PERCOCET/ROXICET) 5-325 MG tablet Take 1-2 tablets by mouth every 4 (four) hours as needed for severe pain or moderate pain. 10/28/21  Yes Sanjuana Kava, MD  ?simethicone (MYLICON) 793 MG chewable tablet Chew 125 mg by mouth every 6 (six) hours as needed for flatulence.   Yes [provider]  ? ? ? ?Vital Signs: ?BP 124/75 (BP Location: Left Arm)   Pulse 100   Temp 97.8 ?F (36.6 ?C) (Oral)   Resp 17   Ht 5' (1.524 m)   Wt 218 lb 0.6 oz (98.9 kg)   LMP 10/07/2021 (Approximate)   SpO2 95%   BMI 42.58 kg/m?  ? ?Physical Exam ?Vitals reviewed.  ?Constitutional:   ?   General: She is not in acute distress. ?   Appearance: She is well-developed. She is not ill-appearing.  ?HENT:  ?   Head: Normocephalic and atraumatic.   ?Pulmonary:  ?   Effort: Pulmonary effort is normal.  ?Abdominal:  ?   Palpations: Abdomen is soft.  ?Skin: ?   General: Skin is warm and dry.  ?   Coloration: Skin is not cyanotic or pale.  ?   Comments: Positive RUQ drain to a suction bulb. Site is unremarkable with no erythema, edema, tenderness, bleeding or drainage. Suture in place. Biopatch at the drain exit site.  The drain is dressed by Tegaderm. 10 ml of thick light brown colored fluid noted in the bulb. Drain flushes well, does not aspirate much.  The suction bulb did not engage when it was detached from the drain, the tubing connecting the suction bulb and the drain was flushed with 5 mL NS.  ?  ?Neurological:  ?   Mental Status: She is alert and oriented to person, place, and time.  ?Psychiatric:     ?   Mood and Affect: Mood normal.     ?   Behavior: Behavior normal.  ? ? ?Imaging: ?CT ABDOMEN PELVIS W CONTRAST ? ?Result Date: 11/20/2021 ?CLINICAL DATA:  Follow-up intra-abdominal abscess. EXAM: CT ABDOMEN AND PELVIS WITH CONTRAST TECHNIQUE: Multidetector CT imaging of the abdomen and pelvis was performed using the standard protocol following bolus administration of intravenous contrast. RADIATION DOSE REDUCTION: This exam was performed according to the departmental dose-optimization program which includes automated exposure control, adjustment of the mA and/or kV according to  patient size and/or use of iterative reconstruction technique. CONTRAST:  136m OMNIPAQUE IOHEXOL 300 MG/ML  SOLN COMPARISON:  CT 11/10/2021, 11/05/2021 FINDINGS: Lower chest: Decreasing right pleural effusion with small residual. Right lower and middle lobe atelectasis. Left pleural effusion is similar with adjacent left basilar atelectasis. Hepatobiliary: No focal hepatic lesion. Gallbladder physiologically distended, no calcified stone. No biliary dilatation. Perihepatic fluid collections will be described below. Pancreas: No ductal dilatation or inflammation. Spleen: Peri  splenic fluid collection which may be subcapsular with slight deformity of the splenic contour. This has increased in size from prior exam currently measuring 12.7 x 10.9 x 12.2 cm with some peripheral enhancement. The spleen is otherwise unremarkable. Adrenals/Urinary Tract: No adrenal nodule. No hydronephrosis or perinephric edema. Homogeneous renal enhancement with symmetric excretion on delayed phase imaging. Urinary bladder is physiologically distended without wall thickening. Stomach/Bowel: Left upper quadrant fluid collection causes mild mass effect on the proximal stomach. The stomach is otherwise decompressed. No small bowel obstruction or ileus. Administered enteric contrast reaches the splenic flexure of the colon. Partial left colectomy with descending colostomy, no peristomal hernia. Stapled off sigmoid colon. Resolved colonic inflammation just proximal to the ostomy. Vascular/Lymphatic: Normal caliber abdominal aorta. Patent portal, splenic, and superior mesenteric veins. No suspicious abdominopelvic adenopathy. Reproductive: Hysterectomy.  No adnexal mass. Other: The previous subhepatic fluid collection has resolved with drainage catheter in place. Minimal ill-defined fluid foci of gas persist adjacent to the catheter. Crescentic right subdiaphragmatic fluid collection has diminished in size from prior exam currently measuring 7.3 x 3 x 2.5 cm, small focus of gas persists. Left upper quadrant perisplenic collection has increased currently measuring 12.8 x 10.9 x 12.2 cm with peripheral enhancement. A small medial component tracks adjacent to the stomach. There additional smaller fluid collections including a thin elongated collection along the undersurface of the upper anterior abdominal wall. This is diminished in size from prior exam, maximal thickness 10 mm. Small residual interloop fluid collection in the right lower quadrant has decreased in size, currently approximately 11 x 23 mm. Soft tissue  gas and edema of the anterior abdominal wall improving, no discrete abdominal wall drainable collection. Musculoskeletal: There are no acute or suspicious osseous abnormalities. IMPRESSION: 1. Decreased size of previous subhepatic fluid collection with drainage catheter in place, only small amount of non organized fluid and air persists. 2. Increased size of left upper quadrant perisplenic fluid collection currently measuring 12.8 x 10.9 x 12.2 cm with some peripheral enhancement. This may be subcapsular with slight deformity of the splenic contour. 3. Decreased size of right subdiaphragmatic fluid collection currently measuring 7.3 x 3 x 2.5 cm. 4. Smaller fluid collections involving the undersurface of the anterior abdominal wall and interloop collection in the right lower quadrant have decreased in size. 5. Decreased right pleural effusion with small residual. Unchanged left pleural effusion with adjacent atelectasis. Electronically Signed   By: MKeith RakeM.D.   On: 11/20/2021 15:37   ? ?Labs: ? ?CBC: ?Recent Labs  ?  11/18/21 ?1434 11/19/21 ?0114 11/20/21 ?0169604/13/23 ?0116  ?WBC 8.5 7.8 6.2 6.6  ?HGB 8.4* 8.0* 7.4* 7.6*  ?HCT 27.1* 25.7* 24.2* 25.0*  ?PLT 891* 779* 663* 635*  ? ? ? ?COAGS: ?No results for input(s): INR, APTT in the last 8760 hours. ? ?BMP: ?Recent Labs  ?  11/16/21 ?0103 11/17/21 ?0258 11/19/21 ?0114 11/20/21 ?0915  ?NA 135 134* 135 135  ?K 4.3 4.4 4.6 4.4  ?CL 103 102 102 100  ?CO2 25 25 24  27  ?GLUCOSE 157* 87 129* 124*  ?BUN '12 10 11 10  '$ ?CALCIUM 7.9* 8.2* 8.6* 8.5*  ?CREATININE 0.64 0.68 0.56 0.55  ?GFRNONAA >60 >60 >60 >60  ? ? ? ?LIVER FUNCTION TESTS: ?Recent Labs  ?  11/05/21 ?1255  ?BILITOT 0.7  ?AST 32  ?ALT 22  ?ALKPHOS 138*  ?PROT 6.0*  ?ALBUMIN 1.9*  ? ? ? ?Assessment and Plan: ? ?56 y.o. female with ex lap for perforated diverticulitis with colectomy and colostomy creation on 0/73, recovery complicated by intra-abdominal fluid collection development, s/p RUQ drain  placement y Dr. Serafina Royals on 11/11/21.  ? ?VSS, WBC normal  ?Cx E coli  ?Output 30 mL, thick light brown purulent fluid ? ?Drain Location: RUQ ?Size: Fr size: 10 Fr ?Date of placement: 11/11/21  ?Currently to: Drain collection devic

## 2021-11-22 LAB — GLUCOSE, CAPILLARY
Glucose-Capillary: 100 mg/dL — ABNORMAL HIGH (ref 70–99)
Glucose-Capillary: 106 mg/dL — ABNORMAL HIGH (ref 70–99)
Glucose-Capillary: 109 mg/dL — ABNORMAL HIGH (ref 70–99)
Glucose-Capillary: 124 mg/dL — ABNORMAL HIGH (ref 70–99)
Glucose-Capillary: 131 mg/dL — ABNORMAL HIGH (ref 70–99)
Glucose-Capillary: 147 mg/dL — ABNORMAL HIGH (ref 70–99)

## 2021-11-22 MED ORDER — INSULIN ASPART 100 UNIT/ML IJ SOLN: 0.0000 [IU] | Freq: Every day | INTRAMUSCULAR | Status: DC

## 2021-11-22 MED ORDER — INSULIN ASPART 100 UNIT/ML IJ SOLN
0.0000 [IU] | Freq: Three times a day (TID) | INTRAMUSCULAR | Status: DC
  Administered 2021-11-22: 2 [IU] via SUBCUTANEOUS
  Administered 2021-11-23: 3 [IU] via SUBCUTANEOUS
  Administered 2021-11-23 – 2021-11-28 (×7): 2 [IU] via SUBCUTANEOUS

## 2021-11-22 NOTE — Progress Notes (Signed)
[facility] ?Progress Note ? ?8 Days Post-Op  ?Subjective: ?CC-  ?Overall had a much better day yesterday. Tolerating diet, drank 2 Ensure yesterday. Colostomy functioning. Ambulated in the hall yesterday. They have started looking into out of pocket home health options in addition to what her insurance will cover.  ?Main concern for when she gets home is how to manage the wound. States that she started having increased drainage yesterday and had to change the dressing at least 5 times yesterday. ? ?Objective: ?Vital signs in last 24 hours: ?Temp:  [97.7 ?F (36.5 ?C)-98.3 ?F (36.8 ?C)] 98.3 ?F (36.8 ?C) (04/14 3007) ?Pulse Rate:  [100-107] 104 (04/14 0811) ?Resp:  [18] 18 (04/14 6226) ?BP: (111-124)/(68-71) 111/69 (04/14 3335) ?SpO2:  [93 %-95 %] 93 % (04/14 0811) ?Last BM Date : 11/21/21 ? ?Intake/Output from previous day: ?04/13 0701 - 04/14 0700 ?In: 425 [P.O.:420] ?Out: 20 [Drains:20] ?Intake/Output this shift: ?No intake/output data recorded. ? ?PE: ?Gen:  Alert, NAD ?Abd: soft, nondistended, appropriately tender over incision otherwise nontender, colostomy bag with brown stool and gas - unable to visualize stoma. IR drain with seropurulent fluid in bulb. Midline incision with seropurulent drainage, retention sutures intact, no cellulitis, pin point opening in the wound at the distal 1/3 aspect as pictured below ? ? ? ?Lab Results:  ?Recent Labs  ?  11/20/21 ?4562 11/21/21 ?0116  ?WBC 6.2 6.6  ?HGB 7.4* 7.6*  ?HCT 24.2* 25.0*  ?PLT 663* 635*  ? ?BMET ?Recent Labs  ?  11/20/21 ?0915  ?NA 135  ?K 4.4  ?CL 100  ?CO2 27  ?GLUCOSE 124*  ?BUN 10  ?CREATININE 0.55  ?CALCIUM 8.5*  ? ?PT/INR ?No results for input(s): LABPROT, INR in the last 72 hours. ?CMP  ?   ?Component Value Date/Time  ? NA 135 11/20/2021 0915  ? K 4.4 11/20/2021 0915  ? CL 100 11/20/2021 0915  ? CO2 27 11/20/2021 0915  ? GLUCOSE 124 (H) 11/20/2021 0915  ? BUN 10 11/20/2021 0915  ? CREATININE 0.55 11/20/2021 0915  ? CALCIUM 8.5 (L)  11/20/2021 0915  ? PROT 6.0 (L) 11/05/2021 1255  ? ALBUMIN 1.9 (L) 11/05/2021 1255  ? AST 32 11/05/2021 1255  ? ALT 22 11/05/2021 1255  ? ALKPHOS 138 (H) 11/05/2021 1255  ? BILITOT 0.7 11/05/2021 1255  ? GFRNONAA >60 11/20/2021 0915  ? ?Lipase  ?   ?Component Value Date/Time  ? LIPASE 21 11/05/2021 1255  ? ? ? ? ? ?Studies/Results: ?CT ABDOMEN PELVIS W CONTRAST ? ?Result Date: 11/20/2021 ?CLINICAL DATA:  Follow-up intra-abdominal abscess. EXAM: CT ABDOMEN AND PELVIS WITH CONTRAST TECHNIQUE: Multidetector CT imaging of the abdomen and pelvis was performed using the standard protocol following bolus administration of intravenous contrast. RADIATION DOSE REDUCTION: This exam was performed according to the departmental dose-optimization program which includes automated exposure control, adjustment of the mA and/or kV according to patient size and/or use of iterative reconstruction technique. CONTRAST:  155m OMNIPAQUE IOHEXOL 300 MG/ML  SOLN COMPARISON:  CT 11/10/2021, 11/05/2021 FINDINGS: Lower chest: Decreasing right pleural effusion with small residual. Right lower and middle lobe atelectasis. Left pleural effusion is similar with adjacent left basilar atelectasis. Hepatobiliary: No focal hepatic lesion. Gallbladder physiologically distended, no calcified stone. No biliary dilatation. Perihepatic fluid collections will be described below. Pancreas: No ductal dilatation or inflammation. Spleen: Peri splenic fluid collection which may be subcapsular with slight deformity of the splenic contour. This has increased in size from prior exam currently measuring 12.7 x 10.9  x 12.2 cm with some peripheral enhancement. The spleen is otherwise unremarkable. Adrenals/Urinary Tract: No adrenal nodule. No hydronephrosis or perinephric edema. Homogeneous renal enhancement with symmetric excretion on delayed phase imaging. Urinary bladder is physiologically distended without wall thickening. Stomach/Bowel: Left upper quadrant fluid  collection causes mild mass effect on the proximal stomach. The stomach is otherwise decompressed. No small bowel obstruction or ileus. Administered enteric contrast reaches the splenic flexure of the colon. Partial left colectomy with descending colostomy, no peristomal hernia. Stapled off sigmoid colon. Resolved colonic inflammation just proximal to the ostomy. Vascular/Lymphatic: Normal caliber abdominal aorta. Patent portal, splenic, and superior mesenteric veins. No suspicious abdominopelvic adenopathy. Reproductive: Hysterectomy.  No adnexal mass. Other: The previous subhepatic fluid collection has resolved with drainage catheter in place. Minimal ill-defined fluid foci of gas persist adjacent to the catheter. Crescentic right subdiaphragmatic fluid collection has diminished in size from prior exam currently measuring 7.3 x 3 x 2.5 cm, small focus of gas persists. Left upper quadrant perisplenic collection has increased currently measuring 12.8 x 10.9 x 12.2 cm with peripheral enhancement. A small medial component tracks adjacent to the stomach. There additional smaller fluid collections including a thin elongated collection along the undersurface of the upper anterior abdominal wall. This is diminished in size from prior exam, maximal thickness 10 mm. Small residual interloop fluid collection in the right lower quadrant has decreased in size, currently approximately 11 x 23 mm. Soft tissue gas and edema of the anterior abdominal wall improving, no discrete abdominal wall drainable collection. Musculoskeletal: There are no acute or suspicious osseous abnormalities. IMPRESSION: 1. Decreased size of previous subhepatic fluid collection with drainage catheter in place, only small amount of non organized fluid and air persists. 2. Increased size of left upper quadrant perisplenic fluid collection currently measuring 12.8 x 10.9 x 12.2 cm with some peripheral enhancement. This may be subcapsular with slight  deformity of the splenic contour. 3. Decreased size of right subdiaphragmatic fluid collection currently measuring 7.3 x 3 x 2.5 cm. 4. Smaller fluid collections involving the undersurface of the anterior abdominal wall and interloop collection in the right lower quadrant have decreased in size. 5. Decreased right pleural effusion with small residual. Unchanged left pleural effusion with adjacent atelectasis. Electronically Signed   By: Keith Rake M.D.   On: 11/20/2021 15:37   ? ?Anti-infectives: ?Anti-infectives (From admission, onward)  ? ? Start     Dose/Rate Route Frequency Ordered Stop  ? 11/05/21 2100  piperacillin-tazobactam (ZOSYN) IVPB 3.375 g  Status:  Discontinued       ? 3.375 g ?12.5 mL/hr over 240 Minutes Intravenous Every 8 hours 11/05/21 2010 11/17/21 0932  ? 11/05/21 1500  piperacillin-tazobactam (ZOSYN) IVPB 3.375 g       ? 3.375 g ?100 mL/hr over 30 Minutes Intravenous  Once 11/05/21 1451 11/05/21 1600  ? ?  ? ? ? ?Assessment/Plan ?-POD#17, s/p ex lap with Hartmann's procedure for perforated diverticulitis by Dr. Grandville Silos 3/28 ?-POD#8 s/p ex lap, washout and drainage of IAA with retention suture closure en mass of abdominal wall for fascial dehiscence Dr Brantley Stage 4/6 ?- CT A/P 4/2 w/ multiple intra-abd fluid collections, two large collections in upper abd.  ?- s/p IR drain RUQ 4/3, culture with e coli ?- 4/6 intra op culture with e coli ?- CT 4/12 shows improving subhepatic fluid collection with drain in place, persistent perisplenic fluid collection, improving subdiaphragmatic and undersurface of the ?anterior abdominal wall fluid collections ?>>currently no systemic signs of infection  given normal WBC and no fevers, favor LUQ fluid collection is a hematoma. No plans for drainage unless patient develops signs/symptoms of infection ?- Increased drainage and small opening from midline wound. Reviewed with MD. Nothing to do surgically. Continue TID dry dressing changes and PRN saturation ?-  having bowel function and tolerating diet  ?- WOCN following for new ostomy ?- PT has signed off. Will ask mobility tech to see. ?- Pain control improved. She did not need any dilaudid yesterday. Continue scheduled t

## 2021-11-22 NOTE — Plan of Care (Signed)
RD consulted for nutrition education regarding diabetes.  ? ?Reviewed notes from DM educator who states HgbA1c is likely elevated due to steroid use. Pt has been told she is pre-diabetic in the past.  ? ?Lab Results  ?Component Value Date  ? HGBA1C 6.6 (H) 11/06/2021  ? ? ?RD provided "General, Healthful Nutrition Therapy" handout and discussed ways to increase consumption of vegetables, fruits, and drinking more water in place of sugar sweetened beverages. Previously pt was given education about colostomy diet to follow after discharge and referral was placed to outpatient nutrition education center. Outpatient RD can follow-up with pt on further DM education if HgbA1c remains abnormal. ? ?Body mass index is 42.58 kg/m?[name] Pt meets criteria for obesity class III based on current BMI. ? ?Current diet order is carb modified. Labs and medications reviewed. Will continue to follow-up with pt as planned ? ?Michele [name], RD, LDN ?Clinical Dietitian ?RD pager # available in [place]  ?After hours/weekend pager # available in [place] ?

## 2021-11-22 NOTE — Consult Note (Signed)
? ?Chief Complaint: ?Patient was seen in consultation today for  ?Chief Complaint  ?Patient presents with  ? Chest Pain  ? Shortness of Breath  ? Abdominal Pain  ? at the request of Surgery ? ?Referring Physician(s): ?Dr. Zenia Resides ? ?Supervising Physician: Daryll Brod ? ?Patient Status: [facility] - In-pt ? ?History of Present Illness: ?Michele [name] is a 56 y.o. female known to IR this admission with bowel perforation requiring intra-abdominal drain placement.  Feeling a lot of pressure in her abdomen.  Leaks fluid from midline wound when standing.  CT shows fluid collections, largest in the perisplenic region.  IR planning for Percutaneous drainage tomorrow. ? ?Past Medical History:  ?Diagnosis Date  ? Anemia   ? Anxiety   ? Hypertension   ? with pregnancies  ? ? ?Past Surgical History:  ?Procedure Laterality Date  ? CESAREAN SECTION    ? TWO of them  ? COLOSTOMY Left 11/05/2021  ? Procedure: COLOSTOMY;  Surgeon: Sanjuana Kava, MD;  Location: [place];  Service: Gynecology;  Laterality: Left;  ? CYSTOSCOPY  10/25/2021  ? Procedure: CYSTOSCOPY;  Surgeon: Sanjuana Kava, MD;  Location: [facility];  Service: Gynecology;;  ? dental implants N/A 2016  ? lower 2 teeth  ? HYSTERECTOMY ABDOMINAL WITH SALPINGECTOMY  10/25/2021  ? Procedure: HYSTERECTOMY ABDOMINAL WITH BILATERAL SALPINGECTOMY;  Surgeon: Sanjuana Kava, MD;  Location: [place];  Service: Gynecology;;  ? HYSTEROSCOPY WITH D & C  01/30/2017  ? Procedure: DILATATION AND CURETTAGE /HYSTEROSCOPY;  Surgeon: Sanjuana Kava, MD;  Location: [facility];  Service: Gynecology;;  ? LAPAROSCOPY  10/25/2021  ? Procedure: LAPAROSCOPY DIAGNOSTIC;  Surgeon: Sanjuana Kava, MD;  Location: [place];  Service: Gynecology;;  ? LAPAROTOMY N/A 11/05/2021  ? Procedure: EXPLORATORY LAPAROTOMY AND COLECTOMY ;  Surgeon: Sanjuana Kava, MD;  Location: [place];  Service: Gynecology;  Laterality: N/A;  ? LAPAROTOMY N/A 11/14/2021  ? Procedure: EXPLORATORY  LAPAROTOMY AND ABDOMINAL WALL CLOSURE;  Surgeon: Erroll Luna, MD;   Location: [place];  Service: General;  Laterality: N/A;  ? ? ?Allergies: ?Lipitor [atorvastatin] and Sulfa antibiotics ? ?Medications: ?Prior to Admission medications   ?Medication Sig Start Date End Date Taking? Authorizing Provider  ?ADDERALL XR 30 MG 24 hr capsule Take 30 mg by mouth daily. 09/23/21  Yes [provider]  ?citalopram (CELEXA) 20 MG tablet Take 20 mg by mouth daily.   Yes [provider]  ?CRESTOR 5 MG tablet TAKE 1 TABLET BY MOUTH EVERY DAY ?Patient taking differently: Take 5 mg by mouth daily. 04/17/15  Yes Dorothy Spark, MD  ?ibuprofen (ADVIL) 600 MG tablet Take 1 tablet (600 mg total) by mouth every 6 (six) hours as needed for cramping or moderate pain. 10/28/21  Yes Sanjuana Kava, MD  ?oxyCODONE-acetaminophen (PERCOCET/ROXICET) 5-325 MG tablet Take 1-2 tablets by mouth every 4 (four) hours as needed for severe pain or moderate pain. 10/28/21  Yes Sanjuana Kava, MD  ?simethicone (MYLICON) 914 MG chewable tablet Chew 125 mg by mouth every 6 (six) hours as needed for flatulence.   Yes [provider]  ?sodium chloride flush (NS) 0.9 % SOLN 5 mLs by Intracatheter route daily for 28 days. 11/12/21 12/10/21  Han, Aimee H, PA-C  ?  ? ?Family History  ?Problem Relation Age of Onset  ? Heart attack Father   ? Hypertension Sister   ? ? ?Social History  ? ?Socioeconomic History  ? Marital status: Legally Separated  ?  Spouse name: Not on file  ? Number  of children: Not on file  ? Years of education: Not on file  ? Highest education level: Not on file  ?Occupational History  ? Not on file  ?Tobacco Use  ? Smoking status: Former  ?  Packs/day: 0.50  ?  Years: 15.00  ?  Pack years: 7.50  ?  Types: Cigarettes  ?  Quit date: 2003  ?  Years since quitting: 20.2  ? Smokeless tobacco: Never  ? Tobacco comments:  ?  quit 2003  ?Vaping Use  ? Vaping Use: Never used  ?Substance and Sexual Activity  ? Alcohol use: No  ? Drug use: No  ? Sexual activity: Yes  ?  Birth control/protection: Surgical  ?   Comment: 2006  ?Other Topics Concern  ? Not on file  ?Social History Narrative  ? The pt is a homemaker -pt quit smoking cigarettes 2004   ? ?Social Determinants of Health  ? ?Financial Resource Strain: Not on file  ?Food Insecurity: Not on file  ?Transportation Needs: Not on file  ?Physical Activity: Not on file  ?Stress: Not on file  ?Social Connections: Not on file  ? ? ?Review of Systems: A 12 point ROS discussed and pertinent positives are indicated in the HPI above.  All other systems are negative. ? ?Review of Systems  ?Constitutional:  Positive for activity change, appetite change and fatigue.  ?Respiratory:  Positive for shortness of breath.   ?Gastrointestinal:  Positive for abdominal distention and abdominal pain.  ? ?Vital Signs: ?BP 111/69 (BP Location: Left Arm)   Pulse (!) 104   Temp 98.3 ?F (36.8 ?C) (Oral)   Resp 18   Ht 5' (1.524 m)   Wt 218 lb 0.6 oz (98.9 kg)   LMP 10/07/2021 (Approximate)   SpO2 93%   BMI 42.58 kg/m?  ? ?Physical Exam ?Vitals reviewed.  ?Constitutional:   ?   General: She is not in acute distress. ?   Appearance: She is well-developed. She is ill-appearing.  ?HENT:  ?   Head: Normocephalic and atraumatic.  ?Cardiovascular:  ?   Rate and Rhythm: Tachycardia present.  ?Pulmonary:  ?   Effort: Pulmonary effort is normal.  ?   Breath sounds: Normal breath sounds.  ?Abdominal:  ?   General: There is distension.  ?   Palpations: Abdomen is rigid.  ?   Tenderness: There is abdominal tenderness.  ?Neurological:  ?   Mental Status: She is alert.  ? ? ?Imaging: ?CT Angio Chest PE W/Cm &/Or Wo Cm ? ?Result Date: 11/05/2021 ?CLINICAL DATA:  Abdominal pain. Possible pulmonary embolus. Status post hysterectomy 10 days ago. EXAM: CT ANGIOGRAPHY CHEST CT ABDOMEN AND PELVIS WITH CONTRAST TECHNIQUE: Multidetector CT imaging of the chest was performed using the standard protocol during bolus administration of intravenous contrast. Multiplanar CT image reconstructions and MIPs were obtained  to evaluate the vascular anatomy. Multidetector CT imaging of the abdomen and pelvis was performed using the standard protocol during bolus administration of intravenous contrast. RADIATION DOSE REDUCTION: This exam was performed according to the departmental dose-optimization program which includes automated exposure control, adjustment of the mA and/or kV according to patient size and/or use of iterative reconstruction technique. CONTRAST:  169m OMNIPAQUE IOHEXOL 350 MG/ML SOLN COMPARISON:  None. FINDINGS: CTA CHEST FINDINGS Cardiovascular: Satisfactory opacification of the pulmonary arteries to the segmental level. No evidence of pulmonary embolism. Normal heart size. No pericardial effusion. Mediastinum/Nodes: No enlarged mediastinal, hilar, or axillary lymph nodes. Thyroid gland, trachea, and esophagus demonstrate no  significant findings. Lungs/Pleura: No pneumothorax or pleural effusion is noted. Mild bibasilar subsegmental atelectasis is noted. Musculoskeletal: No chest wall abnormality. No acute or significant osseous findings. Review of the MIP images confirms the above findings. CT ABDOMEN and PELVIS FINDINGS Hepatobiliary: No focal liver abnormality is seen. No gallstones, gallbladder wall thickening, or biliary dilatation. Pancreas: Unremarkable. No pancreatic ductal dilatation or surrounding inflammatory changes. Spleen: Normal in size without focal abnormality. Adrenals/Urinary Tract: Adrenal glands are unremarkable. Kidneys are normal, without renal calculi, focal lesion, or hydronephrosis. Bladder is unremarkable. Stomach/Bowel: The stomach appears normal. There is no evidence of bowel obstruction or inflammation. However, large pneumoperitoneum is noted most consistent with rupture of hollow viscus. There is noted fluid and probable stool within the pneumoperitoneum on the right side concerning for colonic perforation. There is also noted extensive subcutaneous emphysema involving the anterior and  lateral abdominal walls. Vascular/Lymphatic: No significant vascular findings are present. No enlarged abdominal or pelvic lymph nodes. Reproductive: Status post hysterectomy. No adnexal masses. Other: 5.6 x 3.4

## 2021-11-22 NOTE — Progress Notes (Signed)
Occupational Therapy Treatment ?Patient Details ?Name: Michele [name] ?MRN: [id number] ?DOB: 1966-07-19 ?Today's Date: 11/22/2021 ? ? ?History of present illness Pt is a 56 y.o. admitted from GYN office on 11/05/21 with SOB and abdominal wall redness 10-days s/p open hysterectomy (10/25/21). Abdominal CT showed significant pneumoperitoneum with possible bowel perforation. S/p ex lap, sigmoid colectomy, and colostomy on 3/28. S/p RUQ subhepatic drain placement 4/3. Pt with wound dehiscence s/p ex lap with abdominal cavity washout and drainage of intra-abdominal abscess, abdominal wall closure 4/6. PMH includes anxiety, anemia, HTN. ?  ?OT comments ? Pt continues to have increased abdominal pain. Concerned with decrease in output of ostomy. Focus of session on educating in use of AE for LB ADLs. Pt has a reacher and long handled bath sponge, instructed in use of sock aid and safe footwear. Pt has ordered a BSC on her own.   ? ?Recommendations for follow up therapy are one component of a multi-disciplinary discharge planning process, led by the attending physician.  Recommendations may be updated based on patient status, additional functional criteria and insurance authorization. ?   ?Follow Up Recommendations ? No OT follow up  ?  ?Assistance Recommended at Discharge None  ?Patient can return home with the following ? A little help with walking and/or transfers;Assistance with cooking/housework;Assist for transportation;Help with stairs or ramp for entrance;A lot of help with bathing/dressing/bathroom ?  ?Equipment Recommendations ?  (RW)  ?  ?Recommendations for Other Services   ? ?  ?Precautions / Restrictions Precautions ?Precautions: Fall;Other (comment) ?Precaution Comments: abdominal precautions for comfort, colostomy, RUQ drain ?Required Braces or Orthoses: Other Brace (abdominal binder) ?Restrictions ?Weight Bearing Restrictions: No  ? ? ?  ? ?Mobility Bed Mobility ?  ?  ?  ?  ?  ?  ?  ?General bed mobility  comments: Received sitting in recliner ?  ? ?Transfers ?  ?  ?  ?  ?  ?  ?  ?  ?  ?  ?  ?  ?Balance   ?  ?  ?  ?  ?  ?  ?  ?  ?  ?  ?  ?  ?  ?  ?  ?  ?  ?  ?   ? ?ADL either performed or assessed with clinical judgement  ? ?ADL   ?  ?  ?  ?  ?  ?  ?  ?  ?  ?  ?  ?  ?  ?  ?  ?  ?  ?  ?  ?General ADL Comments: Educated in use of AE for LB bathing and dressing and safe footwear. ?  ? ?Extremity/Trunk Assessment   ?  ?  ?  ?  ?  ? ?Vision   ?  ?  ?Perception   ?  ?Praxis   ?  ? ?Cognition Arousal/Alertness: Awake/alert ?Behavior During Therapy: Anxious ?Overall Cognitive Status: Within Functional Limits for tasks assessed ?  ?  ?  ?  ?  ?  ?  ?  ?  ?  ?  ?  ?  ?  ?  ?  ?  ?  ?  ?   ?Exercises   ? ?  ?Shoulder Instructions   ? ? ?  ?General Comments    ? ? ?Pertinent Vitals/ Pain       Pain Assessment ?Pain Assessment: Faces ?Faces Pain Scale: Hurts whole lot ?Pain Location: abdomen ?Pain Descriptors / Indicators: Sore, Discomfort ?Pain Intervention(s):  Premedicated before session, Monitored during session ? ?Home Living   ?  ?  ?  ?  ?  ?  ?  ?  ?  ?  ?  ?  ?  ?  ?  ?  ?  ?  ? ?  ?Prior Functioning/Environment    ?  ?  ?  ?   ? ?Frequency ? Min 2X/week  ? ? ? ? ?  ?Progress Toward Goals ? ?OT Goals(current goals can now be found in the care plan section) ? Progress towards OT goals: Not progressing toward goals - comment (pain limiting) ? ?Acute Rehab OT Goals ?OT Goal Formulation: With patient ?Time For Goal Achievement: 12/04/21 ?Potential to Achieve Goals: Good  ?Plan Discharge plan remains appropriate   ? ?Co-evaluation ? ? ?   ?  ?  ?  ?  ? ?  ?AM-PAC OT "6 Clicks" Daily Activity     ?Outcome Measure ? ? Help from another person eating meals?: None ?Help from another person taking care of personal grooming?: A Little ?Help from another person toileting, which includes using toliet, bedpan, or urinal?: A Little ?Help from another person bathing (including washing, rinsing, drying)?: A Lot ?Help from another person  to put on and taking off regular upper body clothing?: A Little ?Help from another person to put on and taking off regular lower body clothing?: A Lot ?6 Click Score: 17 ? ?  ?End of Session   ? ?OT Visit Diagnosis: Pain ?  ?Activity Tolerance Patient limited by pain ?  ?Patient Left in chair;with call bell/phone within reach;with family/visitor present ?  ?Nurse Communication   ?  ? ?   ? ?Time: 0932-3557 ?OT Time Calculation (min): 22 min ? ?Charges: OT General Charges ?$OT Visit: 1 Visit ?OT Treatments ?$Self Care/Home Management : 8-22 mins ?Michele [name], OTR/L ?Acute Rehabilitation Services ?Pager: [phone] ?Office: [phone]  ? ?Michele [name] ?11/22/2021, 12:44 PM ?

## 2021-11-22 NOTE — TOC Progression Note (Addendum)
Transition of Care (TOC) - Progression Note  ? ? ?Patient Details  ?Name: Michele [name] ?MRN: [id number] ?Date of Birth: 1965/11/29 ? ?Transition of Care (TOC) CM/SW Contact  ?Verdell Carmine, RN ?Phone Number: ?11/22/2021, 1:38 PM ? ?Clinical Narrative:    ? ?Sister and patient requested to see RNCM to ask questions regarding how often [facility] RN comes in. For wound care. They have been calling aide agencies to inquire about cost etc. Called and spoke to Nekoma at [facility] [place] to clarify how often RN would come out. RN will come out 3 times a week, but a teachable caregiver is needed. The patient states she has no-one at home, and sister has to go back to [place] this weekend. Discuss further with PT /OT  about recommendations. - PT has signed off, no OT follow up is needed.  Is not a candidate for Rehab based on these recommendations.  ?Discussed with family  and patient at length.  Called BC/BC per request to check eligibility and benefits for Home RN ,  members new policy number [id number]  deductible has been met covered 100%  out of pocket maximum 9100.  Reference number [id number] Require authorization [id number] .   Called for authorization for [facility] RN.  ? ? Pending auth reference number  [id number]  send [facility] order DCS attention.  #[id number] .  Order sent ?Coryell sent to [facility], patient has designated a teachable caregiver in her ex-husband. Hoyle Sauer from [facility] sent message ? ? ?Expected Discharge Plan: [place] ?Barriers to Discharge: Continued Medical Work up ? ?Expected Discharge Plan and Services ?Expected Discharge Plan: [place] ?  ?Discharge Planning Services: CM Consult ?Post Acute Care Choice: Home Health ?Living arrangements for the past 2 months: [place] ?                ?  ?  ?  ?  ?  ?HH Arranged: RN ?  ?  ?  ?  ? ? ?Social Determinants of Health (SDOH) Interventions ?  ? ?Readmission Risk Interventions ?   ? View : No data to display.   ?  ?  ?  ? ? ?

## 2021-11-22 NOTE — Progress Notes (Signed)
Inpatient Diabetes Program Recommendations ? ?AACE/ADA: New Consensus Statement on Inpatient Glycemic Control (2015) ? ?Target Ranges:  Prepandial:   less than 140 mg/dL ?     Peak postprandial:   less than 180 mg/dL (1-2 hours) ?     Critically ill patients:  140 - 180 mg/dL  ? ?Lab Results  ?Component Value Date  ? GLUCAP 147 (H) 11/22/2021  ? HGBA1C 6.6 (H) 11/06/2021  ? ? ?Review of Glycemic Control ? ?Diabetes history: Pre Diabetes in the past ? ?Current orders for Inpatient glycemic control:  ?Novolog 0-15 units tid + hs ? ?Ensure plus tid between meals (44 grams of carbs) ? ?Decadron 10 mg given 3/17 ?Decadron 10 mg given 3/28 ?Decadron 5 mg  given 4/6 ? ?A1c 6.6 obtained on 3/29 after decadron doses ? ?Spoke with pt and sister at bedside. Pt reports being diagnosed with prediabetes in the past and was told to eat like a person with diabetes. Discussed A1c level in regards to steroid doses. I feel at baseline she still has Pre diabetes. ? ?Discussed with pt lifestyle modification with dietary and beverage options. ?PT has a glucometer at home. Pt will follow up with her PCP. ? ?Thanks, ? ?Tama Headings RN, MSN, BC-ADM ?Inpatient Diabetes Coordinator ?Team Pager [phone] (8a-5p) ? ? ? ? ? ?

## 2021-11-23 ENCOUNTER — Inpatient Hospital Stay (HOSPITAL_COMMUNITY): Payer: BC Managed Care – PPO

## 2021-11-23 LAB — BASIC METABOLIC PANEL
Anion gap: 7 (ref 5–15)
BUN: 16 mg/dL (ref 6–20)
CO2: 24 mmol/L (ref 22–32)
Calcium: 8.5 mg/dL — ABNORMAL LOW (ref 8.9–10.3)
Chloride: 103 mmol/L (ref 98–111)
Creatinine, Ser: 0.65 mg/dL (ref 0.44–1.00)
GFR, Estimated: >60 mL/min (ref >60)
Glucose, Bld: 118 mg/dL — ABNORMAL HIGH (ref 70–99)
Potassium: 4.5 mmol/L (ref 3.5–5.1)
Sodium: 134 mmol/L — ABNORMAL LOW (ref 135–145)

## 2021-11-23 LAB — GLUCOSE, CAPILLARY
Glucose-Capillary: 125 mg/dL — ABNORMAL HIGH (ref 70–99)
Glucose-Capillary: 97 mg/dL (ref 70–99)
Glucose-Capillary: 177 mg/dL — ABNORMAL HIGH (ref 70–99)
Glucose-Capillary: 146 mg/dL — ABNORMAL HIGH (ref 70–99)

## 2021-11-23 MED ORDER — FENTANYL CITRATE (PF) 100 MCG/2ML IJ SOLN
INTRAMUSCULAR | Status: AC | PRN
  Administered 2021-11-23: 50 ug via INTRAVENOUS

## 2021-11-23 MED ORDER — LIDOCAINE HCL 1 % IJ SOLN
INTRAMUSCULAR | Status: AC
  Filled 2021-11-23: qty 10

## 2021-11-23 MED ORDER — MIDAZOLAM HCL 2 MG/2ML IJ SOLN
INTRAMUSCULAR | Status: AC | PRN
  Administered 2021-11-23: 1 mg via INTRAVENOUS

## 2021-11-23 MED ORDER — SODIUM CHLORIDE 0.9% FLUSH
5.0000 mL | Freq: Three times a day (TID) | INTRAVENOUS | Status: DC
  Administered 2021-11-23 – 2021-11-29 (×18): 5 mL

## 2021-11-23 MED ORDER — MIDAZOLAM HCL 2 MG/2ML IJ SOLN
INTRAMUSCULAR | Status: AC | PRN
  Administered 2021-11-23: .5 mg via INTRAVENOUS

## 2021-11-23 MED ORDER — MIDAZOLAM HCL 2 MG/2ML IJ SOLN
INTRAMUSCULAR | Status: AC
  Filled 2021-11-23: qty 4

## 2021-11-23 MED ORDER — FENTANYL CITRATE (PF) 100 MCG/2ML IJ SOLN
INTRAMUSCULAR | Status: AC
  Filled 2021-11-23: qty 2

## 2021-11-23 NOTE — Progress Notes (Signed)
?   11/22/21 2100  ?Assess: MEWS Score  ?Temp 98.6 ?F (37 ?C)  ?BP 137/72  ?Pulse Rate (!) 113  ?Resp 18  ?SpO2 93 %  ?O2 Device Room Air  ?Assess: MEWS Score  ?MEWS Temp 0  ?MEWS Systolic 0  ?MEWS Pulse 2  ?MEWS RR 0  ?MEWS LOC 0  ?MEWS Score 2  ?MEWS Score Color Yellow  ?Assess: if the MEWS score is Yellow or Red  ?Were vital signs taken at a resting state? Yes  ?Focused Assessment No change from prior assessment  ?Early Detection of Sepsis Score *See Row Information* Low  ?MEWS guidelines implemented *See Row Information* Yes  ?Take Vital Signs  ?Increase Vital Sign Frequency  Yellow: Q 2hr X 2 then Q 4hr X 2, if remains yellow, continue Q 4hrs  ?Escalate  ?MEWS: Escalate Yellow: discuss with charge nurse/RN and consider discussing with provider and RRT  ?Notify: Charge Nurse/RN  ?Name of Charge Nurse/RN Notified Media  ?Date Charge Nurse/RN Notified 11/22/21  ?Time Charge Nurse/RN Notified 2100  ?Notify: Provider  ?Provider Name/Title On Call Provider  ?Date Provider Notified 11/22/21  ?Time Provider Notified 2200  ?Notification Type Page  ?Provider response No new orders  ?Date of Provider Response 11/22/21  ?Time of Provider Response 2230  ?Document  ?Patient Outcome Other (Comment)  ?Progress note created (see row info) Yes  ? ? ?

## 2021-11-23 NOTE — Progress Notes (Signed)
Mobility Specialist: Progress Note ? ? 11/23/21 1634  ?Mobility  ?Activity Refused mobility  ? ?Pt refused mobility with c/o pain. Will f/u as able.  ? ?Harrell Gave Anwitha Mapes ?Mobility Specialist ?Mobility Specialist [place]: [phone] ?Mobility Specialist [place]: [phone] ? ?

## 2021-11-23 NOTE — Procedures (Signed)
Interventional Radiology Procedure Note ? ?Procedure: LUQ PERISPLENIC ABSCESS DRAIN   ? ?Complications: None ? ?Estimated Blood Loss:  0 ? ?Findings: ?250CC PURULENT FLD ASPIRATED ?CX SENT ?   ? ?M. Daryll Brod, MD ? ? ? ?

## 2021-11-23 NOTE — Progress Notes (Signed)
[facility] ?Progress Note ? ?9 Days Post-Op  ?Subjective: ?CC-  ?Still having drainage from midline wound. Pain is stable. No stool in colostomy for 2 days but is passing flatus. Denies N/V/worsening bloating. Has low appetite.  ? ?Objective: ?Vital signs in last 24 hours: ?Temp:  [98.3 ?F (36.8 ?C)-99.8 ?F (37.7 ?C)] 98.8 ?F (37.1 ?C) (04/15 0865) ?Pulse Rate:  [95-126] 106 (04/15 0922) ?Resp:  [16-18] 16 (04/15 7846) ?BP: (119-137)/(72-81) 120/80 (04/15 9629) ?SpO2:  [91 %-97 %] 97 % (04/15 0922) ?Last BM Date : 11/20/21 ? ?Intake/Output from previous day: ?No intake/output data recorded. ?Intake/Output this shift: ?No intake/output data recorded. ? ?PE: ?Gen:  Alert, NAD ?Abd: soft, nondistended, appropriately tender over incision otherwise nontender, colostomy bag with gas and scant liquid stool. Stoma is pink. IR drain with seropurulent fluid in bulb. Midline incision with seropurulent drainage, retention sutures intact, no cellulitis, pin point opening in the wound at the distal 1/3 aspect draining purulent fluid ? ? ?Lab Results:  ?Recent Labs  ?  11/21/21 ?0116  ?WBC 6.6  ?HGB 7.6*  ?HCT 25.0*  ?PLT 635*  ? ? ?BMET ?Recent Labs  ?  11/23/21 ?0103  ?NA 134*  ?K 4.5  ?CL 103  ?CO2 24  ?GLUCOSE 118*  ?BUN 16  ?CREATININE 0.65  ?CALCIUM 8.5*  ? ? ?PT/INR ?No results for input(s): LABPROT, INR in the last 72 hours. ?CMP  ?   ?Component Value Date/Time  ? NA 134 (L) 11/23/2021 0103  ? K 4.5 11/23/2021 0103  ? CL 103 11/23/2021 0103  ? CO2 24 11/23/2021 0103  ? GLUCOSE 118 (H) 11/23/2021 0103  ? BUN 16 11/23/2021 0103  ? CREATININE 0.65 11/23/2021 0103  ? CALCIUM 8.5 (L) 11/23/2021 0103  ? PROT 6.0 (L) 11/05/2021 1255  ? ALBUMIN 1.9 (L) 11/05/2021 1255  ? AST 32 11/05/2021 1255  ? ALT 22 11/05/2021 1255  ? ALKPHOS 138 (H) 11/05/2021 1255  ? BILITOT 0.7 11/05/2021 1255  ? GFRNONAA >60 11/23/2021 0103  ? ?Lipase  ?   ?Component Value Date/Time  ? LIPASE 21 11/05/2021 1255   ? ? ? ? ? ?Studies/Results: ?No results found. ? ?Anti-infectives: ?Anti-infectives (From admission, onward)  ? ? Start     Dose/Rate Route Frequency Ordered Stop  ? 11/05/21 2100  piperacillin-tazobactam (ZOSYN) IVPB 3.375 g  Status:  Discontinued       ? 3.375 g ?12.5 mL/hr over 240 Minutes Intravenous Every 8 hours 11/05/21 2010 11/17/21 0932  ? 11/05/21 1500  piperacillin-tazobactam (ZOSYN) IVPB 3.375 g       ? 3.375 g ?100 mL/hr over 30 Minutes Intravenous  Once 11/05/21 1451 11/05/21 1600  ? ?  ? ? ? ?Assessment/Plan ?-POD#18, s/p ex lap with Hartmann's procedure for perforated diverticulitis by Dr. 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  ?- s/p IR drain RUQ 4/3, culture with e coli ?- 4/6 intra op culture with e coli ?- CT 4/12 shows improving subhepatic fluid collection with drain in place, persistent perisplenic fluid collection, improving subdiaphragmatic and undersurface of the ?anterior abdominal wall fluid collections ?- Increased drainage and small opening from midline wound. Reviewed with MD. Continue TID dry dressing changes and PRN saturation. Now purulent drainage and question if decompressing LUQ fluid collection - have asked IR  to place a drain ?- having bowel function and tolerating diet  ?- WOCN following for new ostomy ?- PT has signed off. Will ask mobility tech to see. ?- Pain control improved. Continue scheduled tylenol, tramadol and toradol. Needs to take oxy more frequent to avoid needing dilaudid  ?- continue abdominal binder ?- TOC team arranging home health. Patient and her sister are looking into self pay options for more assistance ?- Likely home soon but needs more colostomy/ wound education, improved pain control, and needs to have more assistance lined up to help at home ?   ?FEN: SLIV, NPO for IR. Can resume CM diet/ensure post procedure ?ID: zosyn 3/30>4/8 ?VTE: lovenox ? ?Anxiety - home meds ?DM - A1c 6.6 on admission. SSI. Will ask diabetes coordinator to see and dietician for diet education ? ? ? LOS: 18 days  ? ? ?Winferd Humphrey, PA-C ?[facility] ?11/23/2021, 9:57 AM ?Please see [facility] for pager number during day hours 7:00am-4:30pm ? ?

## 2021-11-24 LAB — BASIC METABOLIC PANEL
Anion gap: 8 (ref 5–15)
BUN: 14 mg/dL (ref 6–20)
CO2: 24 mmol/L (ref 22–32)
Calcium: 8.2 mg/dL — ABNORMAL LOW (ref 8.9–10.3)
Chloride: 100 mmol/L (ref 98–111)
Creatinine, Ser: 0.73 mg/dL (ref 0.44–1.00)
GFR, Estimated: >60 mL/min (ref >60)
Glucose, Bld: 155 mg/dL — ABNORMAL HIGH (ref 70–99)
Potassium: 4.1 mmol/L (ref 3.5–5.1)
Sodium: 132 mmol/L — ABNORMAL LOW (ref 135–145)

## 2021-11-24 LAB — GLUCOSE, CAPILLARY
Glucose-Capillary: 119 mg/dL — ABNORMAL HIGH (ref 70–99)
Glucose-Capillary: 147 mg/dL — ABNORMAL HIGH (ref 70–99)
Glucose-Capillary: 105 mg/dL — ABNORMAL HIGH (ref 70–99)
Glucose-Capillary: 200 mg/dL — ABNORMAL HIGH (ref 70–99)

## 2021-11-24 LAB — CBC
HCT: 23 % — ABNORMAL LOW (ref 36.0–46.0)
Hemoglobin: 7.1 g/dL — ABNORMAL LOW (ref 12.0–15.0)
MCH: 24.8 pg — ABNORMAL LOW (ref 26.0–34.0)
MCHC: 30.9 g/dL (ref 30.0–36.0)
MCV: 80.4 fL (ref 80.0–100.0)
Platelets: 524 10*3/uL — ABNORMAL HIGH (ref 150–400)
RBC: 2.86 MIL/uL — ABNORMAL LOW (ref 3.87–5.11)
RDW: 15.7 % — ABNORMAL HIGH (ref 11.5–15.5)
WBC: 6.1 10*3/uL (ref 4.0–10.5)
nRBC: 0.3 % — ABNORMAL HIGH (ref 0.0–0.2)

## 2021-11-24 MED ORDER — HYDROMORPHONE HCL 1 MG/ML IJ SOLN
0.5000 mg | INTRAMUSCULAR | Status: DC | PRN
  Administered 2021-11-24 – 2021-11-29 (×10): 0.5 mg via INTRAVENOUS
  Filled 2021-11-24 (×11): qty 0.5

## 2021-11-24 NOTE — Progress Notes (Signed)
Mobility Specialist: Progress Note ? ? 11/24/21 1705  ?Mobility  ?Activity Refused mobility  ? ?Pt refused mobility with c/o pain, no rating given. RN present in the room. Will f/u as able.  ? ?Harrell Gave [employer] ?Mobility Specialist ?Mobility Specialist [place]: [phone] ?Mobility Specialist [place]: [phone] ? ?

## 2021-11-24 NOTE — Progress Notes (Signed)
?  LUQ abscess drain placed in IR yesterday ?RUQ abscess drain placed in IR 11/11/21 ? ? ?Drain Location: RUQ, LUQ ?Size: Fr size: 10 Fr ?Date of placement: 11/11/21 and 11/23/21  ?Currently to: Drain collection device: suction bulb ?24 hour output:  ?Output by Drain (mL) 11/22/21 0701 - 11/22/21 1900 11/22/21 1901 - 11/23/21 0700 11/23/21 0701 - 11/23/21 1900 11/23/21 1901 - 11/24/21 0700 11/24/21 0701 - 11/24/21 1135  ?Closed System Drain 1 Right;Anterior;Superior RUQ Bulb (JP) 10 Fr.   10    ?Closed System Drain 1 Lateral;Left Back Bulb (JP) 10 Fr.   525 100   ? ? ?Current examination: ?Flushes/aspirates easily.  ?Insertion site unremarkable. ?Suture and stat lock in place. ?Dressed appropriately.  ? ?Both drains doing well ?RUQ OP cloudy fluid; minimal OP at this point ?Report Status 11/14/2021 FINAL   ?Organism ID, Bacteria ESCHERICHIA COLI   ?Asked to return to pts room after flush this am---said pain was intense for 10-20 minutes after flush---pain has resolved--- no issues with flush; no blood- no infection at site- no redness ? ?LUQ OP purulent ?OP 525 cc yesterday ?Cx pending ?Flushes easily ? ?Plan: ?Continue TID flushes with 5 cc NS. ?Record output Q shift. ?Dressing changes QD or PRN if soiled.  ?Call IR APP or on call IR MD if difficulty flushing or sudden change in drain output.  ?Repeat imaging/possible drain injection once output < 10 mL/QD (excluding flush material.) ? ?Discharge planning: ?Please contact IR APP or on call IR MD prior to patient d/c to ensure appropriate follow up plans are in place. Typically patient will follow up with IR clinic 10-14 days post d/c for repeat imaging/possible drain injection. IR scheduler will contact patient with date/time of appointment. Patient will need to flush drain QD with 5 cc NS, record output QD, dressing changes every 2-3 days or earlier if soiled.  ? ?IR will continue to follow - please call with questions or concerns. ? ?  ?

## 2021-11-24 NOTE — Progress Notes (Signed)
Abdominal dressing changed. Patient still questing Monia Sabal back to room to examine drains. Message sent Marnee Guarneri referring to patient request ?

## 2021-11-24 NOTE — Progress Notes (Signed)
Patient stated unusual pain after Radiology flushing drains this morning. Asked if they could come back and look at drains again. Monia Sabal, PA made aware and stated that she would come back to patients room as soon as possible. Patient made aware. ?

## 2021-11-24 NOTE — Progress Notes (Signed)
[facility] ?Progress Note ? ?10 Days Post-Op  ?Subjective: ?Had drain placed by IR yesterday. Abdominal pain is about the same. Had some RUQ pain after drain flushing but now completely resolved. Still with significant drainage from midline wound. Tolerating diet without N/V/abdominal pain and has had stool and gas in colostomy today ? ?Objective: ?Vital signs in last 24 hours: ?Temp:  [97.3 ?F (36.3 ?C)-98.4 ?F (36.9 ?C)] 98.1 ?F (36.7 ?C) (04/16 0845) ?Pulse Rate:  [91-117] 91 (04/16 0845) ?Resp:  [16-23] 17 (04/16 0845) ?BP: (105-141)/(59-79) 111/69 (04/16 0845) ?SpO2:  [90 %-100 %] 95 % (04/16 0845) ?Last BM Date : 11/20/21 ? ?Intake/Output from previous day: ?04/15 0701 - 04/16 0700 ?In: -  ?Out: 635 [Drains:635] ?Intake/Output this shift: ?No intake/output data recorded. ? ?PE: ?Gen:  Alert, NAD ?Abd: soft, nondistended, appropriately tender over incision otherwise nontender, colostomy bag with gas and stool. Stoma is pink. RUQ drain with seropurulent fluid in bulb. LUQ drain with purulent fluid. Midline incision with bloody seropurulent drainage, retention sutures intact, no cellulitis, pin point opening in the wound at the distal 1/3 aspect draining purulent fluid. ? ? ?Lab Results:  ?Recent Labs  ?  11/24/21 ?0053  ?WBC 6.1  ?HGB 7.1*  ?HCT 23.0*  ?PLT 524*  ? ? ?BMET ?Recent Labs  ?  11/23/21 ?0103 11/24/21 ?1638  ?NA 134* 132*  ?K 4.5 4.1  ?CL 103 100  ?CO2 24 24  ?GLUCOSE 118* 155*  ?BUN 16 14  ?CREATININE 0.65 0.73  ?CALCIUM 8.5* 8.2*  ? ? ?PT/INR ?No results for input(s): LABPROT, INR in the last 72 hours. ?CMP  ?   ?Component Value Date/Time  ? NA 132 (L) 11/24/2021 0053  ? K 4.1 11/24/2021 0053  ? CL 100 11/24/2021 0053  ? CO2 24 11/24/2021 0053  ? GLUCOSE 155 (H) 11/24/2021 0053  ? BUN 14 11/24/2021 0053  ? CREATININE 0.73 11/24/2021 0053  ? CALCIUM 8.2 (L) 11/24/2021 0053  ? PROT 6.0 (L) 11/05/2021 1255  ? ALBUMIN 1.9 (L) 11/05/2021 1255  ? AST 32 11/05/2021 1255  ? ALT 22 11/05/2021  1255  ? ALKPHOS 138 (H) 11/05/2021 1255  ? BILITOT 0.7 11/05/2021 1255  ? GFRNONAA >60 11/24/2021 0053  ? ?Lipase  ?   ?Component Value Date/Time  ? LIPASE 21 11/05/2021 1255  ? ? ? ? ? ?Studies/Results: ?CT IMAGE GUIDED FLUID DRAIN BY CATHETER ? ?Result Date: 11/23/2021 ?INDICATION: Left upper quadrant perisplenic subdiaphragmatic abscess EXAM: CT DRAINAGE OF THE LEFT UPPER QUADRANT PERISPLENIC ABSCESS MEDICATIONS: The patient is currently admitted to the hospital and receiving intravenous antibiotics. The antibiotics were administered within an appropriate time frame prior to the initiation of the procedure. ANESTHESIA/SEDATION: Moderate (conscious) sedation was employed during this procedure. A total of Versed 2.0 mg and Fentanyl 100 mcg was administered intravenously by the radiology nurse. Total intra-service moderate Sedation Time: 19 minutes. The patient's level of consciousness and vital signs were monitored continuously by radiology nursing throughout the procedure under my direct supervision. COMPLICATIONS: None immediate. PROCEDURE: Informed written consent was obtained from the patient after a thorough discussion of the procedural risks, benefits and alternatives. All questions were addressed. Maximal Sterile Barrier Technique was utilized including caps, mask, sterile gowns, sterile gloves, sterile drape, hand hygiene and skin antiseptic. A timeout was performed prior to the initiation of the procedure. Previous imaging reviewed. Patient positioned left anterior oblique. Noncontrast localization CT performed. The left upper quadrant perisplenic abscess was localized and marked for a lateral  intercostal approach. Under sterile conditions and local anesthesia, an 18 gauge 10 cm access was advanced percutaneously into the fluid collection. Needle position confirmed with CT. Syringe aspiration yielded exudative fluid. Sample sent for culture. Guidewire inserted followed by tract dilatation to insert a 10  [place] drain. Drain catheter position confirmed with CT. Syringe aspiration yielded 2 in 50 cc purulent fluid. Catheter secured with a Prolene suture and connected to external suction bulb. Sterile dressing applied. No immediate complication. Patient tolerated the procedure well. IMPRESSION: Successful CT-guided left upper quadrant perisplenic abscess drainage Electronically Signed   By: Jerilynn Mages.  Shick M.D.   On: 11/23/2021 17:34   ? ?Anti-infectives: ?Anti-infectives (From admission, onward)  ? ? Start     Dose/Rate Route Frequency Ordered Stop  ? 11/05/21 2100  piperacillin-tazobactam (ZOSYN) IVPB 3.375 g  Status:  Discontinued       ? 3.375 g ?12.5 mL/hr over 240 Minutes Intravenous Every 8 hours 11/05/21 2010 11/17/21 0932  ? 11/05/21 1500  piperacillin-tazobactam (ZOSYN) IVPB 3.375 g       ? 3.375 g ?100 mL/hr over 30 Minutes Intravenous  Once 11/05/21 1451 11/05/21 1600  ? ?  ? ? ? ?Assessment/Plan ?-POD#19, s/p ex lap with Hartmann's procedure for perforated diverticulitis by Dr. 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  ?- s/p IR drain RUQ 4/3, culture with e coli ?- 4/6 intra op culture with e coli ?- CT 4/12 shows improving subhepatic fluid collection with drain in place, persistent perisplenic fluid collection, improving subdiaphragmatic and undersurface of the ?anterior abdominal wall fluid collections ?- Purulent drainage and small opening from midline wound. Continue TID dry dressing changes and PRN saturation.  ?- s/p IR drain placement into LUQ perisplenic fluid collection 4/15 - 250 cc purulent fluid aspirated with placement.  Follow culture.  ?- Monitor drain output and midline drainage ?- having bowel function and tolerating diet ?- WOCN following for new ostomy ?- PT has signed off. Will ask  mobility tech to see. ?- Pain control improved. Continue scheduled tylenol, tramadol and toradol. Needs to take oxy more frequent to avoid needing dilaudid. Weaning IV dilaudid ?- continue abdominal binder ?- TOC team arranging home health. Patient and her sister are looking into self pay options for more assistance ?- Likely home soon but needs more colostomy/ wound education, improved pain control, and needs to have more assistance lined up to help at home ?  ?FEN: SLIV, CM ?ID: zosyn 3/30>4/8 ?VTE: lovenox ? ?Anxiety - home meds ?DM - A1c 6.6 on admission. SSI. Will ask diabetes coordinator to see and dietician for diet education ? ? ? LOS: 19 days  ? ? ?Winferd Humphrey, PA-C ?[facility] ?11/24/2021, 10:18 AM ?Please see Amion for pager number during day hours 7:00am-4:30pm ? ?

## 2021-11-25 LAB — GLUCOSE, CAPILLARY
Glucose-Capillary: 134 mg/dL — ABNORMAL HIGH (ref 70–99)
Glucose-Capillary: 138 mg/dL — ABNORMAL HIGH (ref 70–99)
Glucose-Capillary: 78 mg/dL (ref 70–99)
Glucose-Capillary: 93 mg/dL (ref 70–99)
Glucose-Capillary: 137 mg/dL — ABNORMAL HIGH (ref 70–99)

## 2021-11-25 LAB — CBC
HCT: 23.2 % — ABNORMAL LOW (ref 36.0–46.0)
Hemoglobin: 7.3 g/dL — ABNORMAL LOW (ref 12.0–15.0)
MCH: 25.3 pg — ABNORMAL LOW (ref 26.0–34.0)
MCHC: 31.5 g/dL (ref 30.0–36.0)
MCV: 80.3 fL (ref 80.0–100.0)
Platelets: 579 10*3/uL — ABNORMAL HIGH (ref 150–400)
RBC: 2.89 MIL/uL — ABNORMAL LOW (ref 3.87–5.11)
RDW: 15.7 % — ABNORMAL HIGH (ref 11.5–15.5)
WBC: 6.2 10*3/uL (ref 4.0–10.5)
nRBC: 0.8 % — ABNORMAL HIGH (ref 0.0–0.2)

## 2021-11-25 NOTE — Progress Notes (Signed)
[facility] ?Progress Note ? ?11 Days Post-Op  ?Subjective: ?Had some more pain in abdomen along incision yesterday - exacerbated by movement and minimal at rest. Well controlled so far this am. Tolerating soft diet with bowel function per ostomy. She thinks she is having less drainage from midline wound ? ?Objective: ?Vital signs in last 24 hours: ?Temp:  [98.4 ?F (36.9 ?C)-98.9 ?F (37.2 ?C)] 98.9 ?F (37.2 ?C) (04/17 0746) ?Pulse Rate:  [99-120] 100 (04/17 0746) ?Resp:  [18-20] 18 (04/17 0746) ?BP: (110-135)/(62-85) 110/62 (04/17 0746) ?SpO2:  [91 %-94 %] 91 % (04/17 0746) ?Last BM Date : 11/24/21 ? ?Intake/Output from previous day: ?04/16 0701 - 04/17 0700 ?In: -  ?Out: 37 [Drains:65] ?Intake/Output this shift: ?No intake/output data recorded. ? ?PE: ?Gen:  Alert, NAD ?Abd: soft, nondistended, appropriately tender over incision otherwise nontender, colostomy bag empty (just changed prior to my exam). RUQ drain with scant cloudy serous fluid in bulb. LUQ drain with purulent fluid. Midline incision with bloody seropurulent drainage, retention sutures intact with mild erythema around sutures, no cellulitis, pin point opening in the wound at the distal 1/3 aspect draining purulent fluid. ? ? ?Lab Results:  ?Recent Labs  ?  11/24/21 ?0053 11/25/21 ?0113  ?WBC 6.1 6.2  ?HGB 7.1* 7.3*  ?HCT 23.0* 23.2*  ?PLT 524* 579*  ? ? ?BMET ?Recent Labs  ?  11/23/21 ?0103 11/24/21 ?0867  ?NA 134* 132*  ?K 4.5 4.1  ?CL 103 100  ?CO2 24 24  ?GLUCOSE 118* 155*  ?BUN 16 14  ?CREATININE 0.65 0.73  ?CALCIUM 8.5* 8.2*  ? ? ?PT/INR ?No results for input(s): LABPROT, INR in the last 72 hours. ?CMP  ?   ?Component Value Date/Time  ? NA 132 (L) 11/24/2021 0053  ? K 4.1 11/24/2021 0053  ? CL 100 11/24/2021 0053  ? CO2 24 11/24/2021 0053  ? GLUCOSE 155 (H) 11/24/2021 0053  ? BUN 14 11/24/2021 0053  ? CREATININE 0.73 11/24/2021 0053  ? CALCIUM 8.2 (L) 11/24/2021 0053  ? PROT 6.0 (L) 11/05/2021 1255  ? ALBUMIN 1.9 (L) 11/05/2021 1255   ? AST 32 11/05/2021 1255  ? ALT 22 11/05/2021 1255  ? ALKPHOS 138 (H) 11/05/2021 1255  ? BILITOT 0.7 11/05/2021 1255  ? GFRNONAA >60 11/24/2021 0053  ? ?Lipase  ?   ?Component Value Date/Time  ? LIPASE 21 11/05/2021 1255  ? ? ? ? ? ?Studies/Results: ?CT IMAGE GUIDED FLUID DRAIN BY CATHETER ? ?Result Date: 11/23/2021 ?INDICATION: Left upper quadrant perisplenic subdiaphragmatic abscess EXAM: CT DRAINAGE OF THE LEFT UPPER QUADRANT PERISPLENIC ABSCESS MEDICATIONS: The patient is currently admitted to the hospital and receiving intravenous antibiotics. The antibiotics were administered within an appropriate time frame prior to the initiation of the procedure. ANESTHESIA/SEDATION: Moderate (conscious) sedation was employed during this procedure. A total of Versed 2.0 mg and Fentanyl 100 mcg was administered intravenously by the radiology nurse. Total intra-service moderate Sedation Time: 19 minutes. The patient's level of consciousness and vital signs were monitored continuously by radiology nursing throughout the procedure under my direct supervision. COMPLICATIONS: None immediate. PROCEDURE: Informed written consent was obtained from the patient after a thorough discussion of the procedural risks, benefits and alternatives. All questions were addressed. Maximal Sterile Barrier Technique was utilized including caps, mask, sterile gowns, sterile gloves, sterile drape, hand hygiene and skin antiseptic. A timeout was performed prior to the initiation of the procedure. Previous imaging reviewed. Patient positioned left anterior oblique. Noncontrast localization CT performed. The left upper  quadrant perisplenic abscess was localized and marked for a lateral intercostal approach. Under sterile conditions and local anesthesia, an 18 gauge 10 cm access was advanced percutaneously into the fluid collection. Needle position confirmed with CT. Syringe aspiration yielded exudative fluid. Sample sent for culture. Guidewire inserted  followed by tract dilatation to insert a 10 [place] drain. Drain catheter position confirmed with CT. Syringe aspiration yielded 2 in 50 cc purulent fluid. Catheter secured with a Prolene suture and connected to external suction bulb. Sterile dressing applied. No immediate complication. Patient tolerated the procedure well. IMPRESSION: Successful CT-guided left upper quadrant perisplenic abscess drainage Electronically Signed   By: Jerilynn Mages.  Shick M.D.   On: 11/23/2021 17:34   ? ?Anti-infectives: ?Anti-infectives (From admission, onward)  ? ? Start     Dose/Rate Route Frequency Ordered Stop  ? 11/05/21 2100  piperacillin-tazobactam (ZOSYN) IVPB 3.375 g  Status:  Discontinued       ? 3.375 g ?12.5 mL/hr over 240 Minutes Intravenous Every 8 hours 11/05/21 2010 11/17/21 0932  ? 11/05/21 1500  piperacillin-tazobactam (ZOSYN) IVPB 3.375 g       ? 3.375 g ?100 mL/hr over 30 Minutes Intravenous  Once 11/05/21 1451 11/05/21 1600  ? ?  ? ? ? ?Assessment/Plan ?-POD#20, s/p ex lap with Hartmann's procedure for perforated diverticulitis by Dr. 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  ?- s/p IR drain RUQ 4/3, culture with e coli ?- 4/6 intra op culture with e coli ?- CT 4/12 shows improving subhepatic fluid collection with drain in place, persistent perisplenic fluid collection, improving subdiaphragmatic and undersurface of the ?anterior abdominal wall fluid collections ?- Purulent drainage and small opening from midline wound. Continue TID dry dressing changes and PRN saturation.  ?- s/p IR drain placement into LUQ perisplenic fluid collection 4/15 - 50 cc/24H.  Follow culture - mod gram pos cocci, strep mitis/oralis ?- Monitor drain output and midline drainage - midline drainage seems to be decreasing and hopefully will  decompress more through drain than midline ?- having bowel function and tolerating diet ?- WOCN following for new ostomy ?- PT has signed off. Will ask mobility tech to see. ?- Pain control improving. Continue scheduled tylenol, tramadol and toradol. Needs to take oxy more frequently to avoid needing dilaudid. Weaning IV dilaudid ?- continue abdominal binder ?- TOC team arranging home health. Patient and her sister are looking into self pay options for more assistance ?- Likely home soon but needs more colostomy/ wound education, improved pain control, and needs to have more assistance lined up to help at home ?  ?FEN: SLIV, CM ?ID: zosyn 3/30>4/8 ?VTE: lovenox ? ?Anxiety - home meds ?DM - A1c 6.6 on admission. SSI. Will ask diabetes coordinator to see and dietician for diet education ? ? ? LOS: 20 days  ? ? ?Winferd Humphrey, PA-C ?[facility] ?11/25/2021, 10:52 AM ?Please see Amion for pager number during day hours 7:00am-4:30pm ? ?

## 2021-11-25 NOTE — Progress Notes (Addendum)
?  I received a message in chat from RN this am timed at 5pm yesterday-- asking if I was coming back to see pt as asked. ?This morning I saw message ? ?I came back to pts room around 1130 am yesterday as asked. ?Spoke to both pt and sister ?They both said Rt drain had become sore after flushing ?Painful maybe 20 minutes and resolved ?I explained about flushing and needs---- I may have irritated area but glad it had resolved ?I reflected this in my note at 1135 am 4/16 ?She and sister said they had no further needs ? ?

## 2021-11-25 NOTE — Progress Notes (Signed)
Patient ambulated about 242f in the hallways with the help of a walker and accompanied by her sister. Patient tolerated it well. ?

## 2021-11-25 NOTE — Consult Note (Addendum)
[facility] Nurse ostomy follow up ?Stoma type/location: LMQ end colostomy ?Stomal assessment/size: oval  4 cm x 3 cm , stoma is retracted at 3 o'clock.  Separation noted from 6 to 12 o'clock. Photo in chart.  ?Peristomal assessment: intact  midline wound in place with NS moist gauze. Has LUQ drain with tan creamy effluent present.  Complaining of increased pain in abdominal wound.  Asked to mention this to surgery, but informed that if she is constipated, she may be experiencing abdominal fullness.  Encouraged to drink fluids and continue ambulation.  ?Treatment options for stomal/peristomal skin: barrier ring and 1 piece convex pouch  Adding a belt today. She is too tender to wear it, but I anticipate she will need it.  ?Output Today, patient has hard balls of stool from recessed stoma.   ?Ostomy pouching: 1pc.convex with barrier ring ?Education provided:  I measured and drew the pattern on the back of the pouch and patient is able to cut pouch opening to needed size. She applies barrier ring with some assistance and applies pouch with assistance.  She is gaining confidence in self care.  ?Her sister is at bedside and is returning to [place] tomorrow and is worried about patient ability to care for herself.  She is working with [facility] team to have [facility] and possible private caregivers.  I inform her that [facility] is for teaching and patient will need to be able to provide care for herself (and learn) or have someone there that can.  They understand this.  She is gaining independence but still in a considerable amount of pain.  ?Enrolled patient in [place] Start Discharge program: Yes ?Has called outpatient ostomy clinic for an appointment.  ?Will follow.  ?Domenic Moras MSN, RN, FNP-BC CWON ?Wound, Ostomy, Continence Nurse ?Pager [phone]  ?

## 2021-11-26 LAB — AEROBIC/ANAEROBIC CULTURE W GRAM STAIN (SURGICAL/DEEP WOUND)

## 2021-11-26 LAB — BASIC METABOLIC PANEL
Anion gap: 12 (ref 5–15)
BUN: 11 mg/dL (ref 6–20)
CO2: 22 mmol/L (ref 22–32)
Calcium: 8.5 mg/dL — ABNORMAL LOW (ref 8.9–10.3)
Chloride: 99 mmol/L (ref 98–111)
Creatinine, Ser: 0.56 mg/dL (ref 0.44–1.00)
GFR, Estimated: >60 mL/min (ref >60)
Glucose, Bld: 88 mg/dL (ref 70–99)
Potassium: 4.3 mmol/L (ref 3.5–5.1)
Sodium: 133 mmol/L — ABNORMAL LOW (ref 135–145)

## 2021-11-26 LAB — GLUCOSE, CAPILLARY
Glucose-Capillary: 108 mg/dL — ABNORMAL HIGH (ref 70–99)
Glucose-Capillary: 138 mg/dL — ABNORMAL HIGH (ref 70–99)
Glucose-Capillary: 92 mg/dL (ref 70–99)
Glucose-Capillary: 135 mg/dL — ABNORMAL HIGH (ref 70–99)

## 2021-11-26 NOTE — Progress Notes (Signed)
[facility] ?Progress Note ? ?12 Days Post-Op  ?Subjective: ?Dressing changed overnight/early am and is not saturated. Still tolerating diet well but low appetite. Need IV pain meds once overnight otherwise pain controlled with scheduled meds and oxycodone. Ambulating. No other complaints ? ?Objective: ?Vital signs in last 24 hours: ?Temp:  [97.6 ?F (36.4 ?C)-98 ?F (36.7 ?C)] 97.6 ?F (36.4 ?C) (04/18 0820) ?Pulse Rate:  [92-96] 96 (04/18 0820) ?Resp:  [18-19] 18 (04/18 0820) ?BP: (112-121)/(50-73) 112/50 (04/18 0820) ?SpO2:  [91 %-98 %] 91 % (04/18 0820) ?Last BM Date : 11/24/21 ? ?Intake/Output from previous day: ?04/17 0701 - 04/18 0700 ?In: 150 [P.O.:120] ?Out: 95 [Drains:95] ?Intake/Output this shift: ?Total I/O ?In: 220 [P.O.:220] ?Out: -  ? ?PE: ?Gen:  Alert, NAD ?Abd: soft, nondistended, appropriately tender over incision otherwise nontender, colostomy bag with small amount of stool, stoma pink. RUQ drain with scant cloudy serous fluid in bulb. LUQ drain with purulent fluid. Midline incision with bloody seropurulent drainage, retention sutures intact with mild erythema and drainage around sutures, pin point opening in the wound at the distal 1/3 aspect draining purulent fluid, pinpoint opening in most superior aspect of wound draining bloody purulent fluid ? ? ?Lab Results:  ?Recent Labs  ?  11/24/21 ?0053 11/25/21 ?0113  ?WBC 6.1 6.2  ?HGB 7.1* 7.3*  ?HCT 23.0* 23.2*  ?PLT 524* 579*  ? ? ?BMET ?Recent Labs  ?  11/24/21 ?0053 11/26/21 ?4580  ?NA 132* 133*  ?K 4.1 4.3  ?CL 100 99  ?CO2 24 22  ?GLUCOSE 155* 88  ?BUN 14 11  ?CREATININE 0.73 0.56  ?CALCIUM 8.2* 8.5*  ? ? ?PT/INR ?No results for input(s): LABPROT, INR in the last 72 hours. ?CMP  ?   ?Component Value Date/Time  ? NA 133 (L) 11/26/2021 0058  ? K 4.3 11/26/2021 0058  ? CL 99 11/26/2021 0058  ? CO2 22 11/26/2021 0058  ? GLUCOSE 88 11/26/2021 0058  ? BUN 11 11/26/2021 0058  ? CREATININE 0.56 11/26/2021 0058  ? CALCIUM 8.5 (L) 11/26/2021  0058  ? PROT 6.0 (L) 11/05/2021 1255  ? ALBUMIN 1.9 (L) 11/05/2021 1255  ? AST 32 11/05/2021 1255  ? ALT 22 11/05/2021 1255  ? ALKPHOS 138 (H) 11/05/2021 1255  ? BILITOT 0.7 11/05/2021 1255  ? GFRNONAA >60 11/26/2021 0058  ? ?Lipase  ?   ?Component Value Date/Time  ? LIPASE 21 11/05/2021 1255  ? ? ? ? ? ?Studies/Results: ?No results found. ? ?Anti-infectives: ?Anti-infectives (From admission, onward)  ? ? Start     Dose/Rate Route Frequency Ordered Stop  ? 11/05/21 2100  piperacillin-tazobactam (ZOSYN) IVPB 3.375 g  Status:  Discontinued       ? 3.375 g ?12.5 mL/hr over 240 Minutes Intravenous Every 8 hours 11/05/21 2010 11/17/21 0932  ? 11/05/21 1500  piperacillin-tazobactam (ZOSYN) IVPB 3.375 g       ? 3.375 g ?100 mL/hr over 30 Minutes Intravenous  Once 11/05/21 1451 11/05/21 1600  ? ?  ? ? ? ?Assessment/Plan ?-POD#21, s/p ex lap with Hartmann's procedure for perforated diverticulitis by Dr. 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  ?- s/p IR drain RUQ 4/3, culture with e coli ?- 4/6 intra op culture with e coli ?- CT 4/12 shows improving subhepatic fluid collection  with drain in place, persistent perisplenic fluid collection, improving subdiaphragmatic and undersurface of the ?anterior abdominal wall fluid collections ?- Purulent drainage with small openings from midline wound. Continue TID dry dressing changes and PRN saturation.  ?- s/p IR drain placement into LUQ perisplenic fluid collection 4/15 - 50 cc/24H.  Follow culture - mod gram pos cocci, strep mitis/oralis ?- Monitor drain output and midline drainage - midline drainage decreasing and hopefully will decompress more through drain than midline ?- having bowel function and tolerating diet ?- WOCN following for new ostomy ?- PT has signed off. Will ask  mobility tech to see. ?- Pain control improving. Continue scheduled tylenol, tramadol and toradol. Needs to take oxy more frequently to avoid needing dilaudid. Weaning IV dilaudid ?- continue abdominal binder ?- TOC team arranging home health. Patient and her sister are looking into self pay options for more assistance. Given her complicated wound care think skilled nursing may be more appropriate immediately post discharge before transitioning home with home health. Will discuss with TOC ?  ?FEN: SLIV, CM ?ID: zosyn 3/30>4/8 ?VTE: lovenox ? ?Anxiety - home meds ?DM - A1c 6.6 on admission. SSI. Will ask diabetes coordinator to see and dietician for diet education ? ? ? LOS: 21 days  ? ? ?Winferd Humphrey, PA-C ?[facility] ?11/26/2021, 12:19 PM ?Please see Amion for pager number during day hours 7:00am-4:30pm ? ?

## 2021-11-26 NOTE — Progress Notes (Signed)
Nutrition Follow-up ? ?DOCUMENTATION CODES:  ?Obesity unspecified ? ?INTERVENTION:  ?Continue current diet as ordered, encourage PO intake as tolerated ?Continue Ensure Enlive po TID, each supplement provides 350 kcal and 20 grams of protein. ?1 packet Juven BID, each packet provides 95 calories, 2.5 grams of protein (collagen), + micronutrients for wound healing ?Chewable multivitamin daily for wound healing ? ?NUTRITION DIAGNOSIS:  ?Inadequate oral intake related to inability to eat as evidenced by NPO status. ?- remains applicable ? ?GOAL:  ?Patient will meet greater than or equal to 90% of their needs ?- progressing, diet in place, supplements in place ? ?MONITOR:  ?Diet advancement, Labs, I & O's, Weight trends ? ?REASON FOR ASSESSMENT:  ?Consult ?Diet education ? ?ASSESSMENT:  ?56 year old female with hx of HTN, anxiety, and anemia presented from GYN office for decreased appetite, SOB, and redness to abdominal wall. CT showed significant for pneumoperitoneum with possible bowel perforation. ? ?Prior to admission: 3/18 - Op, ex lap with total abdominal hysterectomy with bilateral salpingectomy  ?3/28 - Op, ex lap with sigmoid colectomy, colostomy creation, and hartman's procedure.  ?3/29 - Wound vac placed ?4/3 - RUQ drain placed ?4/5 - wound vac removed ?4/6 - Op, Ex lap with abdominal cavity washout, drainage of abdominal abscess, and retention suture closure  ?4/15 - LUQ drain placement ? ?Pt resting in bedside chair at the time of assessment. States she is tolerating her diet, but that her appetite is low. Reports this was the result of being told she would be at the hospital for awhile longer due to her wound. Again discussed with pt the importance of nutrition with wound healing. States she is drinking her Juven and Ensure.  ? ?Discussed with RN who will also encourage pt to eat and take in adequate nutrition.  ? ?Nutritionally Relevant Medications: ?Scheduled Meds: ? feeding supplement  237 mL Oral TID  WC  ? insulin aspart  0-15 Units Subcutaneous TID WC  ? insulin aspart  0-5 Units Subcutaneous QHS  ? nutrition supplement   1 packet Oral BID BM  ? potassium chloride  20 mEq Oral Daily  ? prenatal vitamin w/FE, FA  1 tablet Oral Q1200  ? ?Labs Reviewed: ?Sodium 133 ?CBG ranges from 78-138 mg/dL over the last 24 hours ?HgbA1c 6.6% (3/29) ? ?NUTRITION - FOCUSED PHYSICAL EXAM: ?Flowsheet Row Most Recent Value  ?Orbital Region No depletion  ?Upper Arm Region No depletion  ?Thoracic and Lumbar Region No depletion  ?Buccal Region No depletion  ?Temple Region No depletion  ?Clavicle Bone Region No depletion  ?Clavicle and Acromion Bone Region No depletion  ?Scapular Bone Region No depletion  ?Dorsal Hand No depletion  ?Patellar Region No depletion  ?Anterior Thigh Region No depletion  ?Posterior Calf Region No depletion  ?Edema (RD Assessment) None  ?Hair Reviewed  ?Eyes Reviewed  ?Mouth Reviewed  ?Skin Reviewed  ?Nails Reviewed  ? ? ?Diet Order:   ?Diet Order   ? ?       ?  Diet regular Room service appropriate? Yes; Fluid consistency: Thin  Diet effective now       ?  ? ?  ?  ? ?  ? ? ?EDUCATION NEEDS:  ?Education needs have been addressed ? ?Skin:  Skin Assessment: Reviewed RN Assessment (surgical incision (midline) with wound vac in place, new colostomy) ? ?Last BM:  4/16 ? ?Height:  ?Ht Readings from Last 1 Encounters:  ?11/05/21 5' (1.524 m)  ? ? ?Weight:  ?Wt Readings from  Last 1 Encounters:  ?11/17/21 98.9 kg  ? ? ?Ideal Body Weight:  45.4 kg ? ?BMI:  Body mass index is 42.58 kg/m?. ? ?Estimated Nutritional Needs:  ?Kcal:  1900 - 2100 ?Protein:  95 - 110 grams ?Fluid:  >/= 1.9 L ? ? ?Ranell Patrick, RD, LDN ?Clinical Dietitian ?RD pager # available in [place]  ?After hours/weekend pager # available in [place] ?

## 2021-11-26 NOTE — TOC Progression Note (Signed)
Transition of Care (TOC) - Progression Note  ? ? ?Patient Details  ?Name: Michele [name] ?MRN: [id number] ?Date of Birth: 05/22/1966 ? ?Transition of Care (TOC) CM/SW Contact  ?Emeterio Reeve, LCSW ?Phone Number: ?11/26/2021, 4:00 PM ? ?Clinical Narrative:    ? ?Per [place] leadership, CSW faxd pt out to SNFs in the area for wound care needs.  ? ?Expected Discharge Plan: [place] ?Barriers to Discharge: Continued Medical Work up ? ?Expected Discharge Plan and Services ?Expected Discharge Plan: [place] ?  ?Discharge Planning Services: CM Consult ?Post Acute Care Choice: Home Health ?Living arrangements for the past 2 months: [place] ?                ?  ?  ?  ?  ?  ?HH Arranged: RN ?  ?  ?  ?  ? ? ?Social Determinants of Health (SDOH) Interventions ?  ? ?Readmission Risk Interventions ?   ? View : No data to display.  ?  ?  ?  ? ?Emeterio Reeve, LCSW ?Clinical Social Worker ? ?

## 2021-11-26 NOTE — Progress Notes (Signed)
Occupational Therapy Treatment ?Patient Details ?Name: NEIMA LACROSS ?MRN: [id number] ?DOB: October 10, 1965 ?Today's Date: 11/26/2021 ? ? ?History of present illness Pt is a 56 y.o. admitted from GYN office on 11/05/21 with SOB and abdominal wall redness 10-days s/p open hysterectomy (10/25/21). Abdominal CT showed significant pneumoperitoneum with possible bowel perforation. S/p ex lap, sigmoid colectomy, and colostomy on 3/28. S/p RUQ subhepatic drain placement 4/3. Pt with wound dehiscence s/p ex lap with abdominal cavity washout and drainage of intra-abdominal abscess, abdominal wall closure 4/6. PMH includes anxiety, anemia, HTN. ?  ?OT comments ? Patient received in recliner and asking to walk in hallway. Patient was min assist to donn abdominal binder and supervision to stand and min guard to walk in hallway with RW.  Patient was instructed on reacher use for LB dressing and provided demonstration but asked not to perform due to pain. Acute OT to continue to follow.   ? ?Recommendations for follow up therapy are one component of a multi-disciplinary discharge planning process, led by the attending physician.  Recommendations may be updated based on patient status, additional functional criteria and insurance authorization. ?   ?Follow Up Recommendations ? No OT follow up  ?  ?Assistance Recommended at Discharge None  ?Patient can return home with the following ? A little help with walking and/or transfers;Assistance with cooking/housework;Assist for transportation;Help with stairs or ramp for entrance;A lot of help with bathing/dressing/bathroom ?  ?Equipment Recommendations ? BSC/3in1  ?  ?Recommendations for Other Services   ? ?  ?Precautions / Restrictions Precautions ?Precautions: Fall;Other (comment) ?Precaution Comments: abdominal precautions for comfort, colostomy, RUQ drain ?Required Braces or Orthoses: Other Brace (abdominal binder) ?Restrictions ?Weight Bearing Restrictions: No  ? ? ?  ? ?Mobility Bed  Mobility ?Overal bed mobility: Needs Assistance ?  ?  ?  ?  ?  ?  ?General bed mobility comments: Received sitting in recliner ?  ? ?Transfers ?Overall transfer level: Modified independent ?Equipment used: Rolling walker (2 wheels) ?Transfers: Sit to/from Stand ?Sit to Stand: Supervision ?  ?  ?  ?  ?  ?General transfer comment: slow to rise but able with supervision, performed mobility in hallway with min guard for safety ?  ?  ?Balance Overall balance assessment: Needs assistance ?Sitting-balance support: No upper extremity supported, Feet supported ?Sitting balance-Leahy Scale: Fair ?  ?  ?Standing balance support: No upper extremity supported, During functional activity ?Standing balance-Leahy Scale: Fair ?  ?  ?  ?  ?  ?  ?  ?  ?  ?  ?  ?  ?   ? ?ADL either performed or assessed with clinical judgement  ? ?ADL Overall ADL's : Needs assistance/impaired ?  ?  ?  ?  ?  ?  ?  ?  ?  ?  ?Lower Body Dressing: Maximal assistance;Sit to/from stand ?Lower Body Dressing Details (indicate cue type and reason): education on reacher use for LB dressing with demonstration due to patient asking not to address today due to pain ?  ?  ?  ?  ?  ?  ?  ?General ADL Comments: education and demonstration on reacher use for LB dressing ?  ? ?Extremity/Trunk Assessment   ?  ?  ?  ?  ?  ? ?Vision   ?  ?  ?Perception   ?  ?Praxis   ?  ? ?Cognition Arousal/Alertness: Awake/alert ?Behavior During Therapy: Anxious ?Overall Cognitive Status: Within Functional Limits for tasks assessed ?Area of Impairment: Safety/judgement ?  ?  ?  ?  ?  ?  ?  ?  ?  ?  ?  ?  ?  ?  ?  ?  General Comments: anxious due to pain ?  ?  ?   ?Exercises   ? ?  ?Shoulder Instructions   ? ? ?  ?General Comments    ? ? ?Pertinent Vitals/ Pain       Pain Assessment ?Pain Assessment: Faces ?Faces Pain Scale: Hurts even more ?Pain Location: abdomen ?Pain Descriptors / Indicators: Sore, Discomfort, Grimacing ?Pain Intervention(s): Limited activity within patient's tolerance,  Monitored during session, Repositioned ? ?Home Living   ?  ?  ?  ?  ?  ?  ?  ?  ?  ?  ?  ?  ?  ?  ?  ?  ?  ?  ? ?  ?Prior Functioning/Environment    ?  ?  ?  ?   ? ?Frequency ? Min 2X/week  ? ? ? ? ?  ?Progress Toward Goals ? ?OT Goals(current goals can now be found in the care plan section) ? Progress towards OT goals: Progressing toward goals ? ?Acute Rehab OT Goals ?OT Goal Formulation: With patient ?Time For Goal Achievement: 12/04/21 ?Potential to Achieve Goals: Good ?ADL Goals ?Pt Will Perform Grooming: Independently;standing ?Pt Will Perform Lower Body Bathing: with modified independence;with adaptive equipment;sit to/from stand ?Pt Will Perform Lower Body Dressing: with modified independence;with adaptive equipment;sit to/from stand ?Pt Will Transfer to Toilet: Independently;ambulating;bedside commode ?Pt Will Perform Toileting - Clothing Manipulation and hygiene: Independently;sit to/from stand  ?Plan Discharge plan remains appropriate   ? ?Co-evaluation ? ? ?   ?  ?  ?  ?  ? ?  ?AM-PAC OT "6 Clicks" Daily Activity     ?Outcome Measure ? ? Help from another person eating meals?: None ?Help from another person taking care of personal grooming?: A Little ?Help from another person toileting, which includes using toliet, bedpan, or urinal?: A Little ?Help from another person bathing (including washing, rinsing, drying)?: A Lot ?Help from another person to put on and taking off regular upper body clothing?: A Little ?Help from another person to put on and taking off regular lower body clothing?: A Lot ?6 Click Score: 17 ? ?  ?End of Session Equipment Utilized During Treatment: Rolling walker (2 wheels);Other (comment) (abdominal binder) ? ?OT Visit Diagnosis: Pain ?  ?Activity Tolerance Patient limited by pain ?  ?Patient Left in chair;with call bell/phone within reach ?  ?Nurse Communication Mobility status ?  ? ?   ? ?Time: 1517-6160 ?OT Time Calculation (min): 25 min ? ?Charges: OT General Charges ?$OT Visit:  1 Visit ?OT Treatments ?$Self Care/Home Management : 8-22 mins ?$Therapeutic Activity: 8-22 mins ? ?Lodema Hong, OTA ?Acute Rehabilitation Services  ?Pager [phone] ?Office [phone] ? ? ?[place] ?11/26/2021, 2:42 PM ?

## 2021-11-26 NOTE — Progress Notes (Signed)
? ? ?Referring Physician(s): ?Dr. Lyman Bishop ? ?Supervising Physician: Jacqulynn Cadet ? ?Patient Status:  [facility] - In-pt ? ?Chief Complaint: ? ?History S/p ex lap for perforated diverticulitis with colectomy and colostomy creation on 5/36, recovery complicated by intra-abdominal fluid collection development, s/p RUQ drain placement y Dr. Serafina Royals on 11/11/21 and LUQ perisplenic abscess drain placement on 4.15.23 by Dr. Annamaria Boots ? ?Subjective: ? ?Patient sitting up in recliner states that she is feeling better but is requesting pain medication at this time.  ? ?Allergies: ?Lipitor [atorvastatin] and Sulfa antibiotics ? ?Medications: ?Prior to Admission medications   ?Medication Sig Start Date End Date Taking? Authorizing Provider  ?ADDERALL XR 30 MG 24 hr capsule Take 30 mg by mouth daily. 09/23/21  Yes [provider]  ?citalopram (CELEXA) 20 MG tablet Take 20 mg by mouth daily.   Yes [provider]  ?CRESTOR 5 MG tablet TAKE 1 TABLET BY MOUTH EVERY DAY ?Patient taking differently: Take 5 mg by mouth daily. 04/17/15  Yes Dorothy Spark, MD  ?ibuprofen (ADVIL) 600 MG tablet Take 1 tablet (600 mg total) by mouth every 6 (six) hours as needed for cramping or moderate pain. 10/28/21  Yes Sanjuana Kava, MD  ?oxyCODONE-acetaminophen (PERCOCET/ROXICET) 5-325 MG tablet Take 1-2 tablets by mouth every 4 (four) hours as needed for severe pain or moderate pain. 10/28/21  Yes Sanjuana Kava, MD  ?simethicone (MYLICON) 144 MG chewable tablet Chew 125 mg by mouth every 6 (six) hours as needed for flatulence.   Yes [provider]  ?sodium chloride flush (NS) 0.9 % SOLN 5 mLs by Intracatheter route daily for 28 days. 11/12/21 12/10/21  Han, Aimee H, PA-C  ? ? ? ?Vital Signs: ?BP (!) 112/50 (BP Location: Left Arm)   Pulse 96   Temp 97.6 ?F (36.4 ?C) (Oral)   Resp 18   Ht 5' (1.524 m)   Wt 218 lb 0.6 oz (98.9 kg)   LMP 10/07/2021 (Approximate)   SpO2 91%   BMI 42.58 kg/m?  ? ?Physical Exam ?Vitals and nursing note  reviewed.  ?Constitutional:   ?   Appearance: She is well-developed.  ?HENT:  ?   Head: Normocephalic and atraumatic.  ?Eyes:  ?   Conjunctiva/sclera: Conjunctivae normal.  ?Pulmonary:  ?   Effort: Pulmonary effort is normal.  ?Abdominal:  ?   Comments:  ?RUQ and LUQ abscess drain both 10 Fr to suction RUQ abscess drain with < 5 ml of light yellow fluid. LUQ lateral drain with 20 ml of light yellow - purulent fluid noted to be in the JP drain. ?  ?Musculoskeletal:     ?   General: Normal range of motion.  ?   Cervical back: Normal range of motion.  ?Skin: ?   General: Skin is warm.  ?Neurological:  ?   Mental Status: She is alert and oriented to person, place, and time.  ? ? ?Imaging: ?CT IMAGE GUIDED FLUID DRAIN BY CATHETER ? ?Result Date: 11/23/2021 ?INDICATION: Left upper quadrant perisplenic subdiaphragmatic abscess EXAM: CT DRAINAGE OF THE LEFT UPPER QUADRANT PERISPLENIC ABSCESS MEDICATIONS: The patient is currently admitted to the hospital and receiving intravenous antibiotics. The antibiotics were administered within an appropriate time frame prior to the initiation of the procedure. ANESTHESIA/SEDATION: Moderate (conscious) sedation was employed during this procedure. A total of Versed 2.0 mg and Fentanyl 100 mcg was administered intravenously by the radiology nurse. Total intra-service moderate Sedation Time: 19 minutes. The patient's level of consciousness and vital signs were monitored  continuously by radiology nursing throughout the procedure under my direct supervision. COMPLICATIONS: None immediate. PROCEDURE: Informed written consent was obtained from the patient after a thorough discussion of the procedural risks, benefits and alternatives. All questions were addressed. Maximal Sterile Barrier Technique was utilized including caps, mask, sterile gowns, sterile gloves, sterile drape, hand hygiene and skin antiseptic. A timeout was performed prior to the initiation of the procedure. Previous imaging  reviewed. Patient positioned left anterior oblique. Noncontrast localization CT performed. The left upper quadrant perisplenic abscess was localized and marked for a lateral intercostal approach. Under sterile conditions and local anesthesia, an 18 gauge 10 cm access was advanced percutaneously into the fluid collection. Needle position confirmed with CT. Syringe aspiration yielded exudative fluid. Sample sent for culture. Guidewire inserted followed by tract dilatation to insert a 10 [place] drain. Drain catheter position confirmed with CT. Syringe aspiration yielded 2 in 50 cc purulent fluid. Catheter secured with a Prolene suture and connected to external suction bulb. Sterile dressing applied. No immediate complication. Patient tolerated the procedure well. IMPRESSION: Successful CT-guided left upper quadrant perisplenic abscess drainage Electronically Signed   By: Jerilynn Mages.  Shick M.D.   On: 11/23/2021 17:34   ? ?Labs: ? ?CBC: ?Recent Labs  ?  11/20/21 ?8280 11/21/21 ?0116 11/24/21 ?0053 11/25/21 ?0113  ?WBC 6.2 6.6 6.1 6.2  ?HGB 7.4* 7.6* 7.1* 7.3*  ?HCT 24.2* 25.0* 23.0* 23.2*  ?PLT 663* 635* 524* 579*  ? ? ?COAGS: ?No results for input(s): INR, APTT in the last 8760 hours. ? ?BMP: ?Recent Labs  ?  11/20/21 ?0349 11/23/21 ?0103 11/24/21 ?0053 11/26/21 ?1791  ?NA 135 134* 132* 133*  ?K 4.4 4.5 4.1 4.3  ?CL 100 103 100 99  ?CO2 '27 24 24 22  '$ ?GLUCOSE 124* 118* 155* 88  ?BUN '10 16 14 11  '$ ?CALCIUM 8.5* 8.5* 8.2* 8.5*  ?CREATININE 0.55 0.65 0.73 0.56  ?GFRNONAA >60 >60 >60 >60  ? ? ?LIVER FUNCTION TESTS: ?Recent Labs  ?  11/05/21 ?1255  ?BILITOT 0.7  ?AST 32  ?ALT 22  ?ALKPHOS 138*  ?PROT 6.0*  ?ALBUMIN 1.9*  ? ? ?Assessment and Plan: ? ?56 y.o. female inpatient. History S/p ex lap for perforated diverticulitis with colectomy and colostomy creation on 5/05, recovery complicated by intra-abdominal fluid collection development, s/p RUQ drain placement y Dr. Serafina Royals on 11/11/21  and LUQ perisplenic abscess drain placement on  4.15.23 by Dr. Annamaria Boots. ? ?Drain Location: RUQ ?Size: Fr size: 10 Fr ?Date of placement: 4.3.23  ?Currently to: Drain collection device: suction bulb ? ?Drain Location: LUQ ?Size: Fr size: 10 Fr ?Date of placement: 4.15.23  ?Currently to: Drain collection device: suction bulb ?24 hour output:  ?Output by Drain (mL) 11/24/21 0700 - 11/24/21 1459 11/24/21 1500 - 11/24/21 2259 11/24/21 2300 - 11/25/21 0659 11/25/21 0700 - 11/25/21 1459 11/25/21 1500 - 11/25/21 2259 11/25/21 2300 - 11/26/21 0659 11/26/21 0700 - 11/26/21 1319  ?Closed System Drain 1 Right;Anterior;Superior RUQ Bulb (JP) 10 Fr.   '15 10  5   '$ ?Closed System Drain 1 Lateral;Left Back Bulb (JP) 10 Fr.   50 40  40   ?RUQ abscess drain with < 5 ml of light yellow fluid. LUQ lateral drain with 20 ml of light yellow - purulent fluid noted to be in the JP drain. ?Cultures from 4.15.23 grew abundant streptococcus mitis/oralis and abundant bacteroides ovatus. Beta lactamase positive. ? ?Interval imaging/drain manipulation:  ?None since second drain placement on 4.15.23 ? ?Current examination: ?Flushes/aspirates easily.  ?Insertion  site unremarkable. ?Suture and stat lock in place. ?Dressed appropriately.  ? ?Plan: ?Continue TID flushes with 5 cc NS. ?Record output Q shift. ?Dressing changes QD or PRN if soiled.  ?Call IR APP or on call IR MD if difficulty flushing or sudden change in drain output.  ?Repeat imaging/possible drain injection once output < 10 mL/QD (excluding flush material.) ? ?Discharge planning: ?Please contact IR APP or on call IR MD prior to patient d/c to ensure appropriate follow up plans are in place. Typically patient will follow up with IR clinic 10-14 days post d/c for repeat imaging/possible drain injection. IR scheduler will contact patient with date/time of appointment. Patient will need to flush drain QD with 5 cc NS, record output QD, dressing changes every 2-3 days or earlier if soiled.  ? ?IR will continue to follow - please call with  questions or concerns. ? ? ?Electronically Signed: ?Jacqualine Mau, NP ?11/26/2021, 1:14 PM ? ? ?I spent a total of 15 Minutes at the patient's bedside AND on the patient's hospital floor or unit, greater than

## 2021-11-27 LAB — GLUCOSE, CAPILLARY
Glucose-Capillary: 110 mg/dL — ABNORMAL HIGH (ref 70–99)
Glucose-Capillary: 139 mg/dL — ABNORMAL HIGH (ref 70–99)
Glucose-Capillary: 120 mg/dL — ABNORMAL HIGH (ref 70–99)
Glucose-Capillary: 152 mg/dL — ABNORMAL HIGH (ref 70–99)

## 2021-11-27 LAB — BASIC METABOLIC PANEL
Anion gap: 8 (ref 5–15)
BUN: 8 mg/dL (ref 6–20)
CO2: 25 mmol/L (ref 22–32)
Calcium: 8.4 mg/dL — ABNORMAL LOW (ref 8.9–10.3)
Chloride: 101 mmol/L (ref 98–111)
Creatinine, Ser: 0.63 mg/dL (ref 0.44–1.00)
GFR, Estimated: >60 mL/min (ref >60)
Glucose, Bld: 129 mg/dL — ABNORMAL HIGH (ref 70–99)
Potassium: 3.9 mmol/L (ref 3.5–5.1)
Sodium: 134 mmol/L — ABNORMAL LOW (ref 135–145)

## 2021-11-27 NOTE — TOC Progression Note (Signed)
Transition of Care (TOC) - Progression Note  ? ? ?Patient Details  ?Name: Michele [name] ?MRN: [id number] ?Date of Birth: January 19, 1966 ? ?Transition of Care (TOC) CM/SW Contact  ?Marilu Favre, RN ?Phone Number: ?11/27/2021, 3:39 PM ? ?Clinical Narrative:    ? ?Patient considering SNF vs home .  ? ?Patient interested in private pay nursing if she chooses to go home . NCM called [facility] and [facility] duty nursing and both instructed NCM to have patient call them directly to discuss schedule and cost.  ? ?NCM provide patient with this information and their contact information. NCM called Hoyle Sauer with [facility] from patient's room and placed on speaker phone. Hoyle Sauer updated that patient looking into SNF and private duty nursing. HHRN from [facility] maybe able to visit three times a week for the first week if insurance allows after that visits will decrease pending what insurance allows.PAtient voiced understanding.   ? ?Expected Discharge Plan: [place] ?Barriers to Discharge: Continued Medical Work up ? ?Expected Discharge Plan and Services ?Expected Discharge Plan: [place] ?  ?Discharge Planning Services: CM Consult ?Post Acute Care Choice: Home Health ?Living arrangements for the past 2 months: [place] ?                ?  ?  ?  ?  ?  ?HH Arranged: RN ?  ?  ?  ?  ? ? ?Social Determinants of Health (SDOH) Interventions ?  ? ?Readmission Risk Interventions ?   ? View : No data to display.  ?  ?  ?  ? ? ?

## 2021-11-27 NOTE — TOC Progression Note (Signed)
Transition of Care (TOC) - Progression Note  ? ? ?Patient Details  ?Name: CHINELO BENN ?MRN: [id number] ?Date of Birth: 09/26/1965 ? ?Transition of Care (TOC) CM/SW Contact  ?Emeterio Reeve, LCSW ?Phone Number: ?11/27/2021, 3:56 PM ? ?Clinical Narrative:    ? ?CSW spoke to pt at bedside about SNF. CSW gave pt her 1 bed offer. Pt inquired about options if she private pays. CSW will contact facilities about SNF private care. Pt also considering [place] private pay for wound care.  ? ?Expected Discharge Plan: [place] ?Barriers to Discharge: Continued Medical Work up ? ?Expected Discharge Plan and Services ?Expected Discharge Plan: [place] ?  ?Discharge Planning Services: CM Consult ?Post Acute Care Choice: Home Health ?Living arrangements for the past 2 months: [place] ?                ?  ?  ?  ?  ?  ?HH Arranged: RN ?  ?  ?  ?  ? ? ?Social Determinants of Health (SDOH) Interventions ?  ? ?Readmission Risk Interventions ?   ? View : No data to display.  ?  ?  ?  ? ?Emeterio Reeve, LCSW ?Clinical Social Worker ? ?

## 2021-11-27 NOTE — Progress Notes (Signed)
Occupational Therapy Treatment ?Patient Details ?Name: Michele [name] ?MRN: [id number] ?DOB: [birth year] ?Today's Date: 11/27/2021 ? ? ?History of present illness Pt is a 56 y.o. admitted from GYN office on 11/05/21 with SOB and abdominal wall redness 10-days s/p open hysterectomy (10/25/21). Abdominal CT showed significant pneumoperitoneum with possible bowel perforation. S/p ex lap, sigmoid colectomy, and colostomy on 3/28. S/p RUQ subhepatic drain placement 4/3. Pt with wound dehiscence s/p ex lap with abdominal cavity washout and drainage of intra-abdominal abscess, abdominal wall closure 4/6. PMH includes anxiety, anemia, HTN. ?  ?OT comments ? Pt assisted to bathroom from recliner for toileting and grooming with supervision and increased time. Declined walking in hall, has set time with mobility tech to walk this afternoon. Pt expressing anxiety about managing abdominal wound and colostomy at home now that her sister has left town.   ? ?Recommendations for follow up therapy are one component of a multi-disciplinary discharge planning process, led by the attending physician.  Recommendations may be updated based on patient status, additional functional criteria and insurance authorization. ?   ?Follow Up Recommendations ? No OT follow up  ?  ?Assistance Recommended at Discharge Intermittent Supervision/Assistance  ?Patient can return home with the following ? A little help with walking and/or transfers;Assistance with cooking/housework;Assist for transportation;Help with stairs or ramp for entrance;A lot of help with bathing/dressing/bathroom ?  ?Equipment Recommendations ? BSC/3in1  ?  ?Recommendations for Other Services   ? ?  ?Precautions / Restrictions Precautions ?Precautions: Fall;Other (comment) ?Precaution Comments: abdominal precautions for comfort, colostomy, RUQ drain ?Required Braces or Orthoses: Other Brace (abdominal binder)  ? ? ?  ? ?Mobility Bed Mobility ?  ?  ?  ?  ?  ?  ?  ?General bed  mobility comments: Received sitting in recliner ?  ? ?Transfers ?Overall transfer level: Modified independent ?Equipment used: None ?  ?  ?  ?  ?  ?  ?  ?General transfer comment: slow to rise, but no physical assistance ?  ?  ?Balance Overall balance assessment: Needs assistance ?  ?Sitting balance-Leahy Scale: Fair ?  ?  ?Standing balance support: No upper extremity supported, During functional activity ?Standing balance-Leahy Scale: Fair ?  ?  ?  ?  ?  ?  ?  ?  ?  ?  ?  ?  ?   ? ?ADL either performed or assessed with clinical judgement  ? ?ADL Overall ADL's : Needs assistance/impaired ?  ?  ?Grooming: Oral care;Wash/dry hands;Standing;Supervision/safety ?  ?  ?  ?  ?  ?  ?  ?  ?  ?Toilet Transfer: Supervision/safety;Ambulation;BSC/3in1 ?  ?Toileting- Clothing Manipulation and Hygiene: Modified independent ?  ?  ?  ?Functional mobility during ADLs: Supervision/safety ?  ?  ? ?Extremity/Trunk Assessment   ?  ?  ?  ?  ?  ? ?Vision   ?  ?  ?Perception   ?  ?Praxis   ?  ? ?Cognition Arousal/Alertness: Awake/alert ?Behavior During Therapy: Anxious ?Overall Cognitive Status: Within Functional Limits for tasks assessed ?  ?  ?  ?  ?  ?  ?  ?  ?  ?  ?  ?  ?  ?  ?  ?  ?General Comments: anxious due to pain ?  ?  ?   ?Exercises   ? ?  ?Shoulder Instructions   ? ? ?  ?General Comments    ? ? ?Pertinent Vitals/ Pain       Pain  Assessment ?Pain Assessment: Faces ?Faces Pain Scale: Hurts even more ?Pain Location: abdomen ?Pain Descriptors / Indicators: Sore, Discomfort, Grimacing ?Pain Intervention(s): Premedicated before session, Monitored during session, Repositioned ? ?Home Living   ?  ?  ?  ?  ?  ?  ?  ?  ?  ?  ?  ?  ?  ?  ?  ?  ?  ?  ? ?  ?Prior Functioning/Environment    ?  ?  ?  ?   ? ?Frequency ? Min 2X/week  ? ? ? ? ?  ?Progress Toward Goals ? ?OT Goals(current goals can now be found in the care plan section) ? Progress towards OT goals: Progressing toward goals ? ?Acute Rehab OT Goals ?OT Goal Formulation: With  patient ?Time For Goal Achievement: 12/04/21 ?Potential to Achieve Goals: Good  ?Plan Discharge plan remains appropriate   ? ?Co-evaluation ? ? ?   ?  ?  ?  ?  ? ?  ?AM-PAC OT "6 Clicks" Daily Activity     ?Outcome Measure ? ? Help from another person eating meals?: None ?Help from another person taking care of personal grooming?: A Little ?Help from another person toileting, which includes using toliet, bedpan, or urinal?: A Little ?Help from another person bathing (including washing, rinsing, drying)?: A Lot ?Help from another person to put on and taking off regular upper body clothing?: A Little ?Help from another person to put on and taking off regular lower body clothing?: A Lot ?6 Click Score: 17 ? ?  ?End of Session   ? ?OT Visit Diagnosis: Pain ?  ?Activity Tolerance Patient limited by pain ?  ?Patient Left in chair;with call bell/phone within reach ?  ?Nurse Communication   ?  ? ?   ? ?Time: 1420-1440 ?OT Time Calculation (min): 20 min ? ?Charges: OT General Charges ?$OT Visit: 1 Visit ?OT Treatments ?$Self Care/Home Management : 8-22 mins ? ?Nestor Lewandowsky, OTR/L ?Acute Rehabilitation Services ?Pager: [phone] ?Office: [phone]  ? ?[name] ?11/27/2021, 3:56 PM ?

## 2021-11-27 NOTE — Progress Notes (Signed)
[facility] ?Progress Note ? ?13 Days Post-Op  ?Subjective: ?Having increasing burning incisional pain which made it difficult to sleep last night. Low appetite but no nausea or emesis. Having bowel function.  ? ?Dressing changed yesterday around 1600 and did not need to be changed again overnight ? ?Objective: ?Vital signs in last 24 hours: ?Temp:  [97.7 ?F (36.5 ?C)-99.1 ?F (37.3 ?C)] 99.1 ?F (37.3 ?C) (04/19 0730) ?Pulse Rate:  [90-104] 104 (04/19 0730) ?Resp:  [16-18] 16 (04/19 0730) ?BP: (116-126)/(68-80) 126/80 (04/19 0730) ?SpO2:  [92 %-97 %] 92 % (04/19 0730) ?Last BM Date : 11/24/21 ? ?Intake/Output from previous day: ?04/18 0701 - 04/19 0700 ?In: 86 [P.O.:820] ?Out: 10 [Drains:10] ?Intake/Output this shift: ?Total I/O ?In: 240 [P.O.:240] ?Out: 37 [Drains:37] ? ?PE: ?Gen:  Alert, NAD ?Abd: soft, nondistended, appropriately tender over incision otherwise nontender, colostomy bag with small amount of stool, stoma no visualized due to stool. RUQ drain with scant cloudy serous fluid in bulb. LUQ drain with purulent fluid. Midline incision with bloody bloody purulent drainage, retention sutures intact with erythema and drainage around sutures, pin point opening in the wound at the distal 1/3 aspect draining purulent fluid, pinpoint opening in most superior aspect of wound draining bloody purulent fluid ? ? ? ? ? ?Lab Results:  ?Recent Labs  ?  11/25/21 ?0113  ?WBC 6.2  ?HGB 7.3*  ?HCT 23.2*  ?PLT 579*  ? ? ?BMET ?Recent Labs  ?  11/26/21 ?2119  ?NA 133*  ?K 4.3  ?CL 99  ?CO2 22  ?GLUCOSE 88  ?BUN 11  ?CREATININE 0.56  ?CALCIUM 8.5*  ? ? ?PT/INR ?No results for input(s): LABPROT, INR in the last 72 hours. ?CMP  ?   ?Component Value Date/Time  ? NA 133 (L) 11/26/2021 0058  ? K 4.3 11/26/2021 0058  ? CL 99 11/26/2021 0058  ? CO2 22 11/26/2021 0058  ? GLUCOSE 88 11/26/2021 0058  ? BUN 11 11/26/2021 0058  ? CREATININE 0.56 11/26/2021 0058  ? CALCIUM 8.5 (L) 11/26/2021 0058  ? PROT 6.0 (L) 11/05/2021  1255  ? ALBUMIN 1.9 (L) 11/05/2021 1255  ? AST 32 11/05/2021 1255  ? ALT 22 11/05/2021 1255  ? ALKPHOS 138 (H) 11/05/2021 1255  ? BILITOT 0.7 11/05/2021 1255  ? GFRNONAA >60 11/26/2021 0058  ? ?Lipase  ?   ?Component Value Date/Time  ? LIPASE 21 11/05/2021 1255  ? ? ? ? ? ?Studies/Results: ?No results found. ? ?Anti-infectives: ?Anti-infectives (From admission, onward)  ? ? Start     Dose/Rate Route Frequency Ordered Stop  ? 11/05/21 2100  piperacillin-tazobactam (ZOSYN) IVPB 3.375 g  Status:  Discontinued       ? 3.375 g ?12.5 mL/hr over 240 Minutes Intravenous Every 8 hours 11/05/21 2010 11/17/21 0932  ? 11/05/21 1500  piperacillin-tazobactam (ZOSYN) IVPB 3.375 g       ? 3.375 g ?100 mL/hr over 30 Minutes Intravenous  Once 11/05/21 1451 11/05/21 1600  ? ?  ? ? ? ?Assessment/Plan ?-POD#22, s/p ex lap with Hartmann's procedure for perforated diverticulitis by Dr. 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  ?- s/p IR drain RUQ 4/3, culture with e coli ?- 4/6 intra op culture with e coli ?- CT 4/12 shows improving subhepatic fluid collection with drain in  place, persistent perisplenic fluid collection, improving subdiaphragmatic and undersurface of the ?anterior abdominal wall fluid collections ?- Purulent drainage with small openings from midline wound. Drainage is improving. Continue TID dry dressing changes and PRN saturation but may be able to transition to bid soon ?- s/p IR drain placement into LUQ perisplenic fluid collection 4/15 - 50 cc/24H.  Follow culture - mod gram pos cocci, strep mitis/oralis ? ?- having bowel function and tolerating diet ?- WOCN following for new ostomy ?- PT has signed off. Will ask mobility tech to see. ?- Pain control improving. Continue scheduled tylenol, tramadol and toradol.  Needs to take oxy more frequently to avoid needing dilaudid. Weaning IV dilaudid but remains available for breakthrough ?- continue abdominal binder ?- TOC team arranging home health. Patient and her sister are looking into self pay options for more assistance. Given her complicated wound care think skilled nursing may be more appropriate immediately post discharge before transitioning home with home health - TOC is looking into SNF placement ?  ?FEN: SLIV, CM ?ID: zosyn 3/30>4/8 ?VTE: lovenox ? ?Anxiety - home meds ?DM - A1c 6.6 on admission. SSI. Will ask diabetes coordinator to see and dietician for diet education ? ? ? LOS: 22 days  ? ? ?Winferd Humphrey, PA-C ?[facility] ?11/27/2021, 8:50 AM ?Please see Amion for pager number during day hours 7:00am-4:30pm ? ?

## 2021-11-27 NOTE — Progress Notes (Signed)
Mobility Specialist: Progress Note ? ? 11/27/21 1654  ?Mobility  ?Activity Refused mobility  ? ?Attempted to see pt x2 today, refused secondary to pain. Will f/u as able.  ? ?Harrell Gave [place] Pulse ?Mobility Specialist ?Mobility Specialist [place]: [phone] ?Mobility Specialist [place]: [phone] ? ?

## 2021-11-28 LAB — GLUCOSE, CAPILLARY
Glucose-Capillary: 114 mg/dL — ABNORMAL HIGH (ref 70–99)
Glucose-Capillary: 117 mg/dL — ABNORMAL HIGH (ref 70–99)
Glucose-Capillary: 128 mg/dL — ABNORMAL HIGH (ref 70–99)
Glucose-Capillary: 145 mg/dL — ABNORMAL HIGH (ref 70–99)

## 2021-11-28 NOTE — Progress Notes (Addendum)
[facility] ?Progress Note ? ?14 Days Post-Op  ?Subjective: ?She and sister are looking into [facility] and SNF options. NAEON and no new complaints from yesterday. Last dressing change around mid day yesterday. ? ?Objective: ?Vital signs in last 24 hours: ?Temp:  [98.1 ?F (36.7 ?C)-98.5 ?F (36.9 ?C)] 98.2 ?F (36.8 ?C) (04/20 8366) ?Pulse Rate:  [85-106] 85 (04/20 2947) ?Resp:  [16-18] 18 (04/20 6546) ?BP: (116-137)/(64-87) 116/64 (04/20 5035) ?SpO2:  [98 %-100 %] 100 % (04/20 4656) ?Last BM Date : 11/27/21 ? ?Intake/Output from previous day: ?04/19 0701 - 04/20 0700 ?In: 69 [P.O.:720] ?Out: 109 [Drains:109] ?Intake/Output this shift: ?No intake/output data recorded. ? ?PE: ?Gen:  Alert, NAD ?Abd: soft, nondistended, appropriately tender over incision otherwise nontender, colostomy bag with stool, stoma not visualized due to stool. RUQ drain with scant serous fluid in bulb. LUQ drain with purulent fluid. Midline incision with bloody purulent drainage, retention sutures intact with erythema and drainage around sutures, pin point opening in the wound at the distal 1/3 aspect draining purulent fluid, pinpoint opening in most superior aspect of wound draining bloody purulent fluid ? ? ?Lab Results:  ?No results for input(s): WBC, HGB, HCT, PLT in the last 72 hours. ? ?BMET ?Recent Labs  ?  11/26/21 ?8127 11/27/21 ?1039  ?NA 133* 134*  ?K 4.3 3.9  ?CL 99 101  ?CO2 22 25  ?GLUCOSE 88 129*  ?BUN 11 8  ?CREATININE 0.56 0.63  ?CALCIUM 8.5* 8.4*  ? ? ?PT/INR ?No results for input(s): LABPROT, INR in the last 72 hours. ?CMP  ?   ?Component Value Date/Time  ? NA 134 (L) 11/27/2021 1039  ? K 3.9 11/27/2021 1039  ? CL 101 11/27/2021 1039  ? CO2 25 11/27/2021 1039  ? GLUCOSE 129 (H) 11/27/2021 1039  ? BUN 8 11/27/2021 1039  ? CREATININE 0.63 11/27/2021 1039  ? CALCIUM 8.4 (L) 11/27/2021 1039  ? PROT 6.0 (L) 11/05/2021 1255  ? ALBUMIN 1.9 (L) 11/05/2021 1255  ? AST 32 11/05/2021 1255  ? ALT 22 11/05/2021 1255  ? ALKPHOS 138  (H) 11/05/2021 1255  ? BILITOT 0.7 11/05/2021 1255  ? GFRNONAA >60 11/27/2021 1039  ? ?Lipase  ?   ?Component Value Date/Time  ? LIPASE 21 11/05/2021 1255  ? ? ? ? ? ?Studies/Results: ?No results found. ? ?Anti-infectives: ?Anti-infectives (From admission, onward)  ? ? Start     Dose/Rate Route Frequency Ordered Stop  ? 11/05/21 2100  piperacillin-tazobactam (ZOSYN) IVPB 3.375 g  Status:  Discontinued       ? 3.375 g ?12.5 mL/hr over 240 Minutes Intravenous Every 8 hours 11/05/21 2010 11/17/21 0932  ? 11/05/21 1500  piperacillin-tazobactam (ZOSYN) IVPB 3.375 g       ? 3.375 g ?100 mL/hr over 30 Minutes Intravenous  Once 11/05/21 1451 11/05/21 1600  ? ?  ? ? ? ?Assessment/Plan ?-POD#23, s/p ex lap with Hartmann's procedure for perforated diverticulitis by Dr. Grandville Silos 3/28 ?-POD#14 s/p ex lap, washout and drainage of IAA with retention suture closure en mass of abdominal wall for fascial dehiscence Dr Brantley Stage 4/6 ?- CT A/P 4/2 w/ multiple intra-abd fluid collections, two large collections in upper abd.  ?- s/p IR drain RUQ 4/3, culture with e coli ?- 4/6 intra op culture with e coli ?- CT 4/12 shows improving subhepatic fluid collection with drain in place, persistent perisplenic fluid collection, improving subdiaphragmatic and undersurface of the ?anterior abdominal wall fluid collections ?- Purulent drainage with small openings from midline  wound. Drainage is improving. Transition to from TID to BID dry dressing changes. Have placed 2x2s under most lateral aspect of retention sutures to help with erosion ?- s/p IR drain placement into LUQ perisplenic fluid collection 4/15 - 50 cc/24H.  Follow culture - mod gram pos cocci, strep mitis/oralis ? ?- having bowel function and tolerating diet ?- WOCN following for new ostomy ?- PT has signed off. Will ask mobility tech to see. ?- Pain control improving. Continue scheduled tylenol, tramadol and toradol. Needs to take oxy more frequently to avoid needing dilaudid. Weaning  IV dilaudid but remains available for breakthrough ?- continue abdominal binder ?- TOC team arranging home health. Patient and her sister are looking into self pay options for more assistance. Given her complicated wound care think skilled nursing may be more appropriate immediately post discharge before transitioning home with home health  ?- TOC looking into both [facility] and SNF with HH already having been arranged. One SNF offer. She is medically stable for discharge once disposition is finalized ? ?Ex husband [name] is bedside today with contact number [phone] ? ?FEN: SLIV, CM ?ID: zosyn 3/30>4/8 ?VTE: lovenox ? ?Anxiety - home meds ?DM - A1c 6.6 on admission. SSI. Will ask diabetes coordinator to see and dietician for diet education. Has glucometer at home and will follow up with PCP ? ? ? LOS: 23 days  ? ? ?Winferd Humphrey, PA-C ?[facility] ?11/28/2021, 11:24 AM ?Please see Amion for pager number during day hours 7:00am-4:30pm ? ?

## 2021-11-28 NOTE — Consult Note (Signed)
[facility] Nurse ostomy follow up ?Patient receiving care in [facility] 6N13. ?Stoma type/location: LUQ colostomy ?Stomal assessment/size: oval approximately 1/5 inches side to side. Os at 3 o'clock below skin level ?Peristomal assessment: intact ?Treatment options for stomal/peristomal skin: barrier ring ?Output: formed brown ?Ostomy pouching: 1pc. Convex with barrier ring. Pt states she uses belt when walking. ?Education provided:  ?Patient up to bathroom to urinate and perform ostomy pouch emptying, change process.  She performed all aspects with my guidance. ?Enrolled patient in [place] Discharge program: Yes, previously. ? ?Additional supplies in room. ? ?Val Riles, RN, MSN, CWOCN, CNS-BC, pager [phone]  ?

## 2021-11-28 NOTE — TOC Progression Note (Signed)
Transition of Care (TOC) - Progression Note  ? ? ?Patient Details  ?Name: Michele [name] ?MRN: [id number] ?Date of Birth: 12/12/1965 ? ?Transition of Care (TOC) CM/SW Contact  ?Emeterio Reeve, LCSW ?Phone Number: ?11/28/2021, 3:26 PM ? ?Clinical Narrative:    ? ?Pt accepted [facility] [place] will start insurance auth.  ? ?Expected Discharge Plan: [place] ?Barriers to Discharge: Continued Medical Work up ? ?Expected Discharge Plan and Services ?Expected Discharge Plan: [place] ?  ?Discharge Planning Services: CM Consult ?Post Acute Care Choice: Home Health ?Living arrangements for the past 2 months: [place] ?                ?  ?  ?  ?  ?  ?HH Arranged: RN ?  ?  ?  ?  ? ? ?Social Determinants of Health (SDOH) Interventions ?  ? ?Readmission Risk Interventions ?   ? View : No data to display.  ?  ?  ?  ? ?Emeterio Reeve, LCSW ?Clinical Social Worker ? ?

## 2021-11-28 NOTE — Discharge Summary (Addendum)
[facility] ?Discharge Summary  ? ?Patient ID: ?Michele [name] ?MRN: [id number] ?DOB/AGE: 04-18-1966 56 y.o. ? ?Admit date: 11/05/2021 ?Discharge date: 11/29/2021 ? ?Admitting Diagnosis: ?Perforated Bowel ? ?Discharge Diagnosis ?Perforated sigmoid colon ?History of exploratory laparotomy with fascial dehiscence ?Anxiety ?DM ? ?Consultants ?CCM ?Interventional radiology ? ?Imaging: ?No results found. ? ?Procedures ?#1. Dr. Grandville Silos (11/05/2021) - EXPLORATORY LAPAROTOMY, SIGMOID COLECTOMY, COLOSTOMY AND HARTMANS ?#2. Dr. Serafina Royals (11/11/2021) - CT guided abdominal drain placement ?#3. Dr. Brantley Stage (11/14/2021) - Exploratory laparotomy with abdominal cavity washout and drainage of intra-abdominal abscess with retention suture closure en mass of abdominal wall ? ?Hospital Course:  ?Michele [name] is a 56yo female S/P laparoscopic converted to open hysterectomy by Dr. Alwyn Pea on 10/26/21 who presented to [facility] 11/05/21 with decreased appetite, shortness of breath, and some redness of her abdominal wall.  She underwent evaluation in the emergency department.  This included a CT scan which ruled out pulmonary embolus.  It did, however, show significant pneumoperitoneum with possible bowel perforation. Patient was emergently taken to the operating room and found to have a perforated sigmoid colon. She underwent exploratory laparotomy with Hartmann's procedure. Tolerated procedure well and was admitted to the ICU postoperatively, CCM consulted for assistance with management. Once stable she was transferred to the surgical floor. She did have an ileus postoperatively as expected. Once his resolved NG was removed and diet advanced as tolerated. Due to persistent leukocytosis a repeat CT scan was obtained 11/10/21 and revealed multiple intra-abdominal fluid collections. IR was consulted for percutaneous drainage. A single drain was placed on 11/11/21; cultures from this fluid grew E coli. On 11/14/21 the patient was found to  have progressive fascial dehiscence of her necrotic fascia. She was taken back to the operating room for Exploratory laparotomy with abdominal cavity washout and drainage of intra-abdominal abscess with retention suture closure en mass of abdominal wall. Intraoperative cultures were obtained and grew E coli. Patient completed a 10 day course of zosyn from 11/07/21 through 11/16/21.  ?After multiple discussions among family, patient, case management and surgical care team patient ultimately decided to discharge home with home health assistance. On POD 24 from Hartmanns procedure and POD 15 from exploratory laparotomy with retention suture closure the patient was felt stable for discharge.  Patient will follow up as below and knows to call with questions or concerns.  IR is arranging follow up to monitor her drains. She has had increased anxiety regarding her medical problems and will follow up with either PCP or psychiatrist in regard to ongoing management. ? ?She will discharge with flushes for drain care and pain medications including course of oxycodone. She has some percocet remaining from hysterectomy with additional oxycodone script sent for adequate post operative coverage. ? ?I have personally reviewed the patients medication history on the [place] controlled substance database.  ? ? ?Physical Exam: ?Gen:  Alert, NAD ?Abd: soft, nondistended, appropriately tender over incision otherwise nontender, colostomy bag with stool, stoma not visualized due to stool. RUQ drain with scant serous fluid in bulb. LUQ drain with purulent fluid. Midline incision with bloody purulent drainage, retention sutures intact with erythema and small amount of drainage around sutures, pin point opening in the wound at the distal 1/3 aspect draining purulent fluid, pinpoint opening in most superior aspect of wound draining bloody purulent fluid ?   ? ? ?Allergies as of 11/29/2021   ? ?   Reactions  ? Lipitor [atorvastatin]   ? Muscle aches on  Lipitor 20 mg  and 40 mg   ? Sulfa Antibiotics Nausea And Vomiting  ? ?  ? ?  ?Medication List  ?  ? ?TAKE these medications   ? ?acetaminophen 500 MG tablet ?Commonly known as: TYLENOL ?Take 2 tablets (1,000 mg total) by mouth every 6 (six) hours as needed for up to 14 days for mild pain or moderate pain. Do NOT take with combination oxycodone- acetaminophen (percocet) but can take with oxycodone by itself ?  ?Adderall XR 30 MG 24 hr capsule ?Generic drug: amphetamine-dextroamphetamine ?Take 30 mg by mouth daily. ?  ?citalopram 20 MG tablet ?Commonly known as: CELEXA ?Take 20 mg by mouth daily. ?  ?Crestor 5 MG tablet ?Generic drug: rosuvastatin ?TAKE 1 TABLET BY MOUTH EVERY DAY ?What changed: how much to take ?  ?ibuprofen 600 MG tablet ?Commonly known as: ADVIL ?Take 1 tablet (600 mg total) by mouth every 6 (six) hours as needed for cramping or moderate pain. ?  ?lidocaine 5 % ?Commonly known as: LIDODERM ?Place 1 patch onto the skin daily for 14 days. Remove & Discard patch within 12 hours or as directed by MD ?Start taking on: November 30, 2021 ?  ?methocarbamol 500 MG tablet ?Commonly known as: ROBAXIN ?Take 2 tablets (1,000 mg total) by mouth every 6 (six) hours as needed for up to 7 days for muscle spasms. ?  ?Oxycodone HCl 10 MG Tabs ?Take 0.5-1 tablets (5-10 mg total) by mouth every 6 (six) hours as needed for up to 5 days for moderate pain or severe pain (5 mg for moderate pain, '10mg'$  for severe pain). ?  ?oxyCODONE-acetaminophen 5-325 MG tablet ?Commonly known as: PERCOCET/ROXICET ?Take 1-2 tablets by mouth every 4 (four) hours as needed for severe pain or moderate pain. ?  ?simethicone 125 MG chewable tablet ?Commonly known as: MYLICON ?Chew 125 mg by mouth every 6 (six) hours as needed for flatulence. ?  ?sodium chloride flush 0.9 % Soln ?Commonly known as: NS ?10 mLs by Intracatheter route daily for 28 days. ?  ? ?  ? ? ? ? Follow-up Information   ? ? Sanjuana Kava, MD .   ?Specialty: Obstetrics and  Gynecology ?Contact information: ?[address] ?[address] ?[place] [address] ?[phone] ? ? ?  ?  ? ? [facility] Follow up.   ?Contact information: ?[address] ?[place] [address] ?[phone] ? ? ?  ?  ? ? Georganna Skeans, MD. Daphane Shepherd on 12/16/2021.   ?Specialty: General Surgery ?Why: Your appointment is 12/16/21 at 10:40am ?Please arrive 30 minutes prior to your appointment to check in and fill out paperwork. Bring photo ID and insurance information. ?Contact information: ?[address] ?[address] ?[place] [address] ?[phone] ? ? ?  ?  ? ? Greggory Keen, MD Follow up.   ?Specialties: Interventional Radiology, Radiology ?Why: schedulers should be contacting you regarding follow up appointment ?Contact information: ?[address] ?[address] ?[place] [address] ?[phone] ? ? ?  ?  ? ?  ?  ? ?  ? ? ? ?Signed: ?Richard Miu, PA-C ?[facility] ?11/29/2021, 11:53 AM ?Please see Amion for pager number during day hours 7:00am-4:30pm ? ?

## 2021-11-28 NOTE — Progress Notes (Signed)
? ? ?Supervising Physician: Juliet Rude ? ?Patient Status:  [facility] - In-pt ? ?Chief Complaint: ? ?Bowel perforation ? ?Subjective: ? ?Feeling well today.  Looking for to d/c.  Sitting upright in chair prepping to wash hair. ? ?Allergies: ?Lipitor [atorvastatin] and Sulfa antibiotics ? ?Medications: ?Prior to Admission medications   ?Medication Sig Start Date End Date Taking? Authorizing Provider  ?ADDERALL XR 30 MG 24 hr capsule Take 30 mg by mouth daily. 09/23/21  Yes [provider]  ?citalopram (CELEXA) 20 MG tablet Take 20 mg by mouth daily.   Yes [provider]  ?CRESTOR 5 MG tablet TAKE 1 TABLET BY MOUTH EVERY DAY ?Patient taking differently: Take 5 mg by mouth daily. 04/17/15  Yes Dorothy Spark, MD  ?ibuprofen (ADVIL) 600 MG tablet Take 1 tablet (600 mg total) by mouth every 6 (six) hours as needed for cramping or moderate pain. 10/28/21  Yes Sanjuana Kava, MD  ?oxyCODONE-acetaminophen (PERCOCET/ROXICET) 5-325 MG tablet Take 1-2 tablets by mouth every 4 (four) hours as needed for severe pain or moderate pain. 10/28/21  Yes Sanjuana Kava, MD  ?simethicone (MYLICON) 165 MG chewable tablet Chew 125 mg by mouth every 6 (six) hours as needed for flatulence.   Yes [provider]  ?sodium chloride flush (NS) 0.9 % SOLN 5 mLs by Intracatheter route daily for 28 days. 11/12/21 12/10/21  Han, Aimee H, PA-C  ? ? ? ?Vital Signs: ?BP 116/64 (BP Location: Left Arm)   Pulse 85   Temp 98.2 ?F (36.8 ?C)   Resp 18   Ht 5' (1.524 m)   Wt 218 lb 0.6 oz (98.9 kg)   LMP 10/07/2021 (Approximate)   SpO2 100%   BMI 42.58 kg/m?  ? ?Physical Exam ?Vitals reviewed.  ?Constitutional:   ?   General: She is not in acute distress. ?   Appearance: She is well-developed. She is not ill-appearing.  ?HENT:  ?   Head: Normocephalic and atraumatic.  ?Eyes:  ?   Extraocular Movements: Extraocular movements intact.  ?Pulmonary:  ?   Effort: Pulmonary effort is normal. No respiratory distress.  ?Abdominal:  ?    Palpations: Abdomen is soft.  ?   Comments: Midline wound dressed.  Ostomy clean and intact.    ?Musculoskeletal:  ?   Cervical back: Neck supple.  ?Skin: ?   General: Skin is warm and dry.  ?Neurological:  ?   General: No focal deficit present.  ?   Mental Status: She is alert and oriented to person, place, and time.  ?Psychiatric:     ?   Mood and Affect: Mood normal.     ?   Behavior: Behavior normal.  ? ? ?24 hour output:  ?Output by Drain (mL) 11/26/21 0700 - 11/26/21 1459 11/26/21 1500 - 11/26/21 2259 11/26/21 2300 - 11/27/21 0659 11/27/21 0700 - 11/27/21 1459 11/27/21 1500 - 11/27/21 2259 11/27/21 2300 - 11/28/21 0659 11/28/21 0700 - 11/28/21 1459 11/28/21 1500 - 11/28/21 1517  ?Closed System Drain 1 Right;Anterior;Superior RUQ Bulb (JP) 10 Fr.  0  32 0 5 0   ?Closed System Drain 1 Lateral;Left Back Bulb (JP) 10 Fr.  '10  5 27 '$ 40 20   ? ? ?Current examination: ?Flushes/aspirates easily.  ?Insertion site unremarkable. ?Suture and stat lock in place. ?Dressed appropriately.  ? ?Imaging: ?No results found. ? ?Labs: ? ?CBC: ?Recent Labs  ?  11/20/21 ?5374 11/21/21 ?0116 11/24/21 ?0053 11/25/21 ?0113  ?WBC 6.2 6.6 6.1 6.2  ?HGB 7.4*  7.6* 7.1* 7.3*  ?HCT 24.2* 25.0* 23.0* 23.2*  ?PLT 663* 635* 524* 579*  ? ? ?BMP: ?Recent Labs  ?  11/23/21 ?0103 11/24/21 ?0053 11/26/21 ?3734 11/27/21 ?1039  ?NA 134* 132* 133* 134*  ?K 4.5 4.1 4.3 3.9  ?CL 103 100 99 101  ?CO2 '24 24 22 25  '$ ?GLUCOSE 118* 155* 88 129*  ?BUN '16 14 11 8  '$ ?CALCIUM 8.5* 8.2* 8.5* 8.4*  ?CREATININE 0.65 0.73 0.56 0.63  ?GFRNONAA >60 >60 >60 >60  ? ? ?LIVER FUNCTION TESTS: ?Recent Labs  ?  11/05/21 ?1255  ?BILITOT 0.7  ?AST 32  ?ALT 22  ?ALKPHOS 138*  ?PROT 6.0*  ?ALBUMIN 1.9*  ? ? ?Assessment and Plan: ? ?Bowel perforation ?--s/p drain placements, draining well ?--SNF soon ?--will put in order for scheduling of outpatient f/u ?--will need to flush catheters daily with 5cc saline each, record OP daily,  and perform dressing changes every 2-3 days or earlier  if soiled. RN to provide education on drain care ? ?In patient Plan: ?Continue TID flushes with 5 cc NS. ?Record output Q shift. ?Dressing changes QD or PRN if soiled.  ?Call IR APP or on call IR MD if difficulty flushing or sudden change in drain output.  ? ?IR will continue to follow - please call with questions or concerns. ? ?Electronically Signed: ?Pasty Spillers, PA ?11/28/2021, 3:14 PM ? ? ?I spent a total of 25 Minutes at the the patient's bedside AND on the patient's hospital floor or unit, greater than 50% of which was counseling/coordinating care for drain care. ? ? ? ? ? ?

## 2021-11-28 NOTE — Progress Notes (Signed)
Mobility Specialist: Progress Note ? ? 11/28/21 1639  ?Mobility  ?Activity Ambulated with assistance in hallway  ?Level of Assistance Modified independent, requires aide device or extra time  ?Assistive Device Front wheel walker  ?Distance Ambulated (ft) 550 ft  ?Activity Response Tolerated well  ?$Mobility charge 1 Mobility  ? ?Received pt in chair having no complaints and agreeable to mobility. Asymptomatic throughout ambulation, returned back to chair w/ call bell in reach and all needs met. ? ?Harrell Gave Kwame Ryland ?Mobility Specialist ?Mobility Specialist [place]: [phone] ?Mobility Specialist [place]: [phone] ? ?

## 2021-11-28 NOTE — TOC Progression Note (Signed)
Transition of Care (TOC) - Progression Note  ? ? ?Patient Details  ?Name: Michele [name] ?MRN: [id number] ?Date of Birth: Jan 03, 1966 ? ?Transition of Care (TOC) CM/SW Contact  ?Marilu Favre, RN ?Phone Number: ?11/28/2021, 11:37 AM ? ?Clinical Narrative:    ? ?Spoke to patient and [name] at bedside with Dr Donne Hazel.  ? ?Discussed home health with added private pay nurses vs SNF.  ? ?Patient's has one  bed offer [facility] . Patient has decided on [facility].  ? ? ? ?Expected Discharge Plan: [place] ?Barriers to Discharge: Continued Medical Work up ? ?Expected Discharge Plan and Services ?Expected Discharge Plan: [place] ?  ?Discharge Planning Services: CM Consult ?Post Acute Care Choice: Home Health ?Living arrangements for the past 2 months: [place] ?                ?  ?  ?  ?  ?  ?HH Arranged: RN ?  ?  ?  ?  ? ? ?Social Determinants of Health (SDOH) Interventions ?  ? ?Readmission Risk Interventions ?   ? View : No data to display.  ?  ?  ?  ? ? ?

## 2021-11-29 ENCOUNTER — Other Ambulatory Visit (HOSPITAL_COMMUNITY): Payer: Self-pay

## 2021-11-29 LAB — GLUCOSE, CAPILLARY
Glucose-Capillary: 115 mg/dL — ABNORMAL HIGH (ref 70–99)
Glucose-Capillary: 103 mg/dL — ABNORMAL HIGH (ref 70–99)
Glucose-Capillary: 138 mg/dL — ABNORMAL HIGH (ref 70–99)

## 2021-11-29 MED ORDER — SODIUM CHLORIDE 0.9% FLUSH: 10.0000 mL | Freq: Every day | INTRAVENOUS | 2 refills | Status: DC

## 2021-11-29 MED ORDER — METHOCARBAMOL 500 MG PO TABS
1000.0000 mg | ORAL_TABLET | Freq: Four times a day (QID) | ORAL | 0 refills | Status: DC | PRN
  Filled 2021-11-29: qty 56, 7d supply, fill #0

## 2021-11-29 MED ORDER — LIDOCAINE 5 % EX PTCH
1.0000 | MEDICATED_PATCH | CUTANEOUS | 0 refills | Status: DC
  Filled 2021-11-29: qty 14, 14d supply, fill #0

## 2021-11-29 MED ORDER — OXYCODONE HCL 10 MG PO TABS
5.0000 mg | ORAL_TABLET | Freq: Four times a day (QID) | ORAL | 0 refills | Status: DC | PRN
  Filled 2021-11-29: qty 20, 5d supply, fill #0

## 2021-11-29 MED ORDER — ACETAMINOPHEN 500 MG PO TABS: 1000.0000 mg | ORAL_TABLET | Freq: Four times a day (QID) | ORAL | Status: AC | PRN

## 2021-11-29 NOTE — Progress Notes (Signed)
PIV removed. AVS reviewed with patient and ex husband at bedside. Both parties verbalize understanding of necessary wound care, drain care and medication regimen. [place] RN arranged and contact information given to patient.  ?

## 2021-11-29 NOTE — TOC Progression Note (Addendum)
Transition of Care (TOC) - Progression Note  ? ? ?Patient Details  ?Name: Michele [name] ?MRN: [id number] ?Date of Birth: August 20, 1965 ? ?Transition of Care (TOC) CM/SW Contact  ?Marilu Favre, RN ?Phone Number: ?11/29/2021, 10:09 AM ? ?Clinical Narrative:    ? ?Spoke to [employer] , patient and [name] have decided to return home at discharge, with [facility] and private pay RN through [facility]. They have spoken to [facility].  ? ?NCM called Ebony Hail at [facility] and will email her clinicals and wound, drain and ostomy care orders. Updated orders and clinicals sent  ? ?Hoyle Sauer with [facility] aware of discharge plan today. Hoyle Sauer will call Ebony Hail at [facility] to discuss scheduling of [facility] and private pay RN. Hoyle Sauer has Epic access and can pull orders from system  ? ?Jana Half PA and Dr Donne Hazel aware  ? ? ?Asked bedside nurse to provide patient with some dressing supplies, drain flushes ( also asked provider for script) and ostomy bags . WOC enrolled in ostomy program for supplies and HH will order dressing supplies , just need a few until orders are delivered  ? ?[place] for walker for home ? ?Expected Discharge Plan: [place] ?Barriers to Discharge: Continued Medical Work up ? ?Expected Discharge Plan and Services ?Expected Discharge Plan: [place] ?  ?Discharge Planning Services: CM Consult ?Post Acute Care Choice: Home Health ?Living arrangements for the past 2 months: [place] ?                ?  ?  ?  ?  ?  ?HH Arranged: RN ?  ?  ?  ?  ? ? ?Social Determinants of Health (SDOH) Interventions ?  ? ?Readmission Risk Interventions ?   ? View : No data to display.  ?  ?  ?  ? ? ?

## 2021-12-01 ENCOUNTER — Other Ambulatory Visit: Payer: Self-pay

## 2021-12-01 ENCOUNTER — Emergency Department (HOSPITAL_COMMUNITY): Payer: BC Managed Care – PPO

## 2021-12-01 ENCOUNTER — Inpatient Hospital Stay (HOSPITAL_COMMUNITY)
Admission: EM | Admit: 2021-12-01 | Discharge: 2021-12-05 | Disposition: A | Discharge status: Home Health | Payer: BC Managed Care – PPO | Source: Home / Self Care

## 2021-12-01 ENCOUNTER — Encounter (HOSPITAL_COMMUNITY): Payer: Self-pay

## 2021-12-01 DIAGNOSIS — K567 Ileus, unspecified: Secondary | ICD-10-CM | POA: Diagnosis not present

## 2021-12-01 DIAGNOSIS — Z882 Allergy status to sulfonamides status: Secondary | ICD-10-CM | POA: Diagnosis not present

## 2021-12-01 DIAGNOSIS — K750 Abscess of liver: Secondary | ICD-10-CM | POA: Diagnosis not present

## 2021-12-01 DIAGNOSIS — I10 Essential (primary) hypertension: Secondary | ICD-10-CM | POA: Diagnosis present

## 2021-12-01 DIAGNOSIS — Z9049 Acquired absence of other specified parts of digestive tract: Secondary | ICD-10-CM | POA: Diagnosis not present

## 2021-12-01 DIAGNOSIS — Z933 Colostomy status: Secondary | ICD-10-CM

## 2021-12-01 DIAGNOSIS — E1165 Type 2 diabetes mellitus with hyperglycemia: Secondary | ICD-10-CM | POA: Diagnosis not present

## 2021-12-01 DIAGNOSIS — E876 Hypokalemia: Secondary | ICD-10-CM | POA: Diagnosis not present

## 2021-12-01 DIAGNOSIS — D509 Iron deficiency anemia, unspecified: Secondary | ICD-10-CM | POA: Diagnosis present

## 2021-12-01 DIAGNOSIS — K651 Peritoneal abscess: Secondary | ICD-10-CM | POA: Diagnosis not present

## 2021-12-01 DIAGNOSIS — Z8249 Family history of ischemic heart disease and other diseases of the circulatory system: Secondary | ICD-10-CM

## 2021-12-01 DIAGNOSIS — T8143XA Infection following a procedure, organ and space surgical site, initial encounter: Principal | ICD-10-CM | POA: Diagnosis present

## 2021-12-01 DIAGNOSIS — I96 Gangrene, not elsewhere classified: Secondary | ICD-10-CM | POA: Diagnosis not present

## 2021-12-01 DIAGNOSIS — Z90710 Acquired absence of both cervix and uterus: Secondary | ICD-10-CM

## 2021-12-01 DIAGNOSIS — Z87891 Personal history of nicotine dependence: Secondary | ICD-10-CM | POA: Diagnosis not present

## 2021-12-01 DIAGNOSIS — R1084 Generalized abdominal pain: Secondary | ICD-10-CM | POA: Diagnosis not present

## 2021-12-01 DIAGNOSIS — Z635 Disruption of family by separation and divorce: Secondary | ICD-10-CM

## 2021-12-01 DIAGNOSIS — Z9889 Other specified postprocedural states: Secondary | ICD-10-CM | POA: Diagnosis not present

## 2021-12-01 DIAGNOSIS — K5720 Diverticulitis of large intestine with perforation and abscess without bleeding: Secondary | ICD-10-CM | POA: Diagnosis not present

## 2021-12-01 DIAGNOSIS — T8132XA Disruption of internal operation (surgical) wound, not elsewhere classified, initial encounter: Secondary | ICD-10-CM | POA: Diagnosis not present

## 2021-12-01 DIAGNOSIS — K631 Perforation of intestine (nontraumatic): Secondary | ICD-10-CM | POA: Diagnosis present

## 2021-12-01 DIAGNOSIS — E785 Hyperlipidemia, unspecified: Secondary | ICD-10-CM | POA: Diagnosis not present

## 2021-12-01 DIAGNOSIS — J982 Interstitial emphysema: Secondary | ICD-10-CM | POA: Diagnosis not present

## 2021-12-01 DIAGNOSIS — R188 Other ascites: Secondary | ICD-10-CM | POA: Diagnosis not present

## 2021-12-01 DIAGNOSIS — K9189 Other postprocedural complications and disorders of digestive system: Secondary | ICD-10-CM | POA: Diagnosis not present

## 2021-12-01 DIAGNOSIS — Y836 Removal of other organ (partial) (total) as the cause of abnormal reaction of the patient, or of later complication, without mention of misadventure at the time of the procedure: Secondary | ICD-10-CM | POA: Diagnosis present

## 2021-12-01 DIAGNOSIS — Z79899 Other long term (current) drug therapy: Secondary | ICD-10-CM | POA: Diagnosis not present

## 2021-12-01 DIAGNOSIS — A4151 Sepsis due to Escherichia coli [E. coli]: Secondary | ICD-10-CM | POA: Diagnosis not present

## 2021-12-01 DIAGNOSIS — Y832 Surgical operation with anastomosis, bypass or graft as the cause of abnormal reaction of the patient, or of later complication, without mention of misadventure at the time of the procedure: Secondary | ICD-10-CM | POA: Diagnosis not present

## 2021-12-01 DIAGNOSIS — N179 Acute kidney failure, unspecified: Secondary | ICD-10-CM | POA: Diagnosis not present

## 2021-12-01 DIAGNOSIS — E861 Hypovolemia: Secondary | ICD-10-CM | POA: Diagnosis not present

## 2021-12-01 DIAGNOSIS — Z888 Allergy status to other drugs, medicaments and biological substances status: Secondary | ICD-10-CM | POA: Diagnosis not present

## 2021-12-01 DIAGNOSIS — Z4682 Encounter for fitting and adjustment of non-vascular catheter: Secondary | ICD-10-CM | POA: Diagnosis not present

## 2021-12-01 DIAGNOSIS — F419 Anxiety disorder, unspecified: Secondary | ICD-10-CM | POA: Diagnosis present

## 2021-12-01 DIAGNOSIS — L02211 Cutaneous abscess of abdominal wall: Secondary | ICD-10-CM | POA: Diagnosis not present

## 2021-12-01 HISTORY — DX: Peritoneal abscess: K65.1

## 2021-12-01 LAB — URINALYSIS, ROUTINE W REFLEX MICROSCOPIC
Bilirubin Urine: NEGATIVE
Glucose, UA: NEGATIVE mg/dL
Hgb urine dipstick: NEGATIVE
Ketones, ur: NEGATIVE mg/dL
Nitrite: NEGATIVE
Protein, ur: NEGATIVE mg/dL
Specific Gravity, Urine: 1.03 (ref 1.005–1.030)
pH: 5 (ref 5.0–8.0)

## 2021-12-01 LAB — CBC WITH DIFFERENTIAL/PLATELET
Abs Immature Granulocytes: 0.17 10*3/uL — ABNORMAL HIGH (ref 0.00–0.07)
Basophils Absolute: 0.1 10*3/uL (ref 0.0–0.1)
Basophils Relative: 1 %
Eosinophils Absolute: 0.1 10*3/uL (ref 0.0–0.5)
Eosinophils Relative: 1 %
HCT: 31.3 % — ABNORMAL LOW (ref 36.0–46.0)
Hemoglobin: 9.1 g/dL — ABNORMAL LOW (ref 12.0–15.0)
Immature Granulocytes: 2 %
Lymphocytes Relative: 19 %
Lymphs Abs: 1.6 10*3/uL (ref 0.7–4.0)
MCH: 23.9 pg — ABNORMAL LOW (ref 26.0–34.0)
MCHC: 29.1 g/dL — ABNORMAL LOW (ref 30.0–36.0)
MCV: 82.2 fL (ref 80.0–100.0)
Monocytes Absolute: 0.9 10*3/uL (ref 0.1–1.0)
Monocytes Relative: 11 %
Neutro Abs: 5.3 10*3/uL (ref 1.7–7.7)
Neutrophils Relative %: 66 %
Platelets: 709 10*3/uL — ABNORMAL HIGH (ref 150–400)
RBC: 3.81 MIL/uL — ABNORMAL LOW (ref 3.87–5.11)
RDW: 16.3 % — ABNORMAL HIGH (ref 11.5–15.5)
WBC: 8.1 10*3/uL (ref 4.0–10.5)
nRBC: 0.7 % — ABNORMAL HIGH (ref 0.0–0.2)

## 2021-12-01 LAB — COMPREHENSIVE METABOLIC PANEL
ALT: 17 U/L (ref 0–44)
AST: 17 U/L (ref 15–41)
Albumin: 2.2 g/dL — ABNORMAL LOW (ref 3.5–5.0)
Alkaline Phosphatase: 106 U/L (ref 38–126)
Anion gap: 12 (ref 5–15)
BUN: 7 mg/dL (ref 6–20)
CO2: 23 mmol/L (ref 22–32)
Calcium: 8.8 mg/dL — ABNORMAL LOW (ref 8.9–10.3)
Chloride: 101 mmol/L (ref 98–111)
Creatinine, Ser: 0.6 mg/dL (ref 0.44–1.00)
GFR, Estimated: >60 mL/min (ref >60)
Glucose, Bld: 152 mg/dL — ABNORMAL HIGH (ref 70–99)
Potassium: 3.5 mmol/L (ref 3.5–5.1)
Sodium: 136 mmol/L (ref 135–145)
Total Bilirubin: 0.5 mg/dL (ref 0.3–1.2)
Total Protein: 6.6 g/dL (ref 6.5–8.1)

## 2021-12-01 LAB — GLUCOSE, CAPILLARY
Glucose-Capillary: 120 mg/dL — ABNORMAL HIGH (ref 70–99)
Glucose-Capillary: 137 mg/dL — ABNORMAL HIGH (ref 70–99)

## 2021-12-01 LAB — LIPASE, BLOOD: Lipase: 26 U/L (ref 11–51)

## 2021-12-01 MED ORDER — PIPERACILLIN-TAZOBACTAM 3.375 G IVPB
3.3750 g | Freq: Three times a day (TID) | INTRAVENOUS | Status: DC
  Administered 2021-12-01 – 2021-12-05 (×13): 3.375 g via INTRAVENOUS
  Filled 2021-12-01 (×13): qty 50

## 2021-12-01 MED ORDER — HYDRALAZINE HCL 20 MG/ML IJ SOLN: 10.0000 mg | INTRAMUSCULAR | Status: DC | PRN

## 2021-12-01 MED ORDER — METOPROLOL TARTRATE 5 MG/5ML IV SOLN: 5.0000 mg | Freq: Four times a day (QID) | INTRAVENOUS | Status: DC | PRN

## 2021-12-01 MED ORDER — METHOCARBAMOL 500 MG PO TABS
1000.0000 mg | ORAL_TABLET | Freq: Three times a day (TID) | ORAL | Status: DC
  Administered 2021-12-01 – 2021-12-02 (×4): 1000 mg via ORAL
  Filled 2021-12-01 (×4): qty 2

## 2021-12-01 MED ORDER — METHOCARBAMOL 500 MG PO TABS: 500.0000 mg | ORAL_TABLET | Freq: Three times a day (TID) | ORAL | Status: DC

## 2021-12-01 MED ORDER — OXYCODONE HCL 5 MG PO TABS
5.0000 mg | ORAL_TABLET | ORAL | Status: DC | PRN
  Administered 2021-12-02 – 2021-12-05 (×12): 10 mg via ORAL
  Filled 2021-12-01 (×13): qty 2

## 2021-12-01 MED ORDER — ENOXAPARIN SODIUM 40 MG/0.4ML IJ SOSY
40.0000 mg | PREFILLED_SYRINGE | INTRAMUSCULAR | Status: DC
  Administered 2021-12-01: 40 mg via SUBCUTANEOUS
  Filled 2021-12-01: qty 0.4

## 2021-12-01 MED ORDER — ACETAMINOPHEN 325 MG PO TABS
650.0000 mg | ORAL_TABLET | Freq: Four times a day (QID) | ORAL | Status: DC | PRN
  Administered 2021-12-02 – 2021-12-03 (×2): 650 mg via ORAL
  Filled 2021-12-01 (×2): qty 2

## 2021-12-01 MED ORDER — DOCUSATE SODIUM 100 MG PO CAPS
100.0000 mg | ORAL_CAPSULE | Freq: Two times a day (BID) | ORAL | Status: DC
  Administered 2021-12-01 – 2021-12-05 (×8): 100 mg via ORAL
  Filled 2021-12-01 (×8): qty 1

## 2021-12-01 MED ORDER — ONDANSETRON HCL 4 MG/2ML IJ SOLN
4.0000 mg | Freq: Once | INTRAMUSCULAR | Status: AC
  Administered 2021-12-01: 4 mg via INTRAVENOUS
  Filled 2021-12-01: qty 2

## 2021-12-01 MED ORDER — GABAPENTIN 300 MG PO CAPS
300.0000 mg | ORAL_CAPSULE | Freq: Three times a day (TID) | ORAL | Status: DC
  Administered 2021-12-02 – 2021-12-05 (×10): 300 mg via ORAL
  Filled 2021-12-01 (×12): qty 1

## 2021-12-01 MED ORDER — METHOCARBAMOL 500 MG PO TABS: 500.0000 mg | ORAL_TABLET | Freq: Three times a day (TID) | ORAL | Status: DC | PRN

## 2021-12-01 MED ORDER — ONDANSETRON 4 MG PO TBDP: 4.0000 mg | ORAL_TABLET | Freq: Four times a day (QID) | ORAL | Status: DC | PRN

## 2021-12-01 MED ORDER — DIPHENHYDRAMINE HCL 25 MG PO CAPS: 25.0000 mg | ORAL_CAPSULE | Freq: Four times a day (QID) | ORAL | Status: DC | PRN

## 2021-12-01 MED ORDER — SODIUM CHLORIDE 0.9 % IV SOLN: INTRAVENOUS | Status: DC

## 2021-12-01 MED ORDER — SENNA 8.6 MG PO TABS
1.0000 | ORAL_TABLET | Freq: Two times a day (BID) | ORAL | Status: DC
  Administered 2021-12-01 – 2021-12-03 (×5): 8.6 mg via ORAL
  Filled 2021-12-01 (×6): qty 1

## 2021-12-01 MED ORDER — IOHEXOL 350 MG/ML SOLN
100.0000 mL | Freq: Once | INTRAVENOUS | Status: AC | PRN
  Administered 2021-12-01: 100 mL via INTRAVENOUS

## 2021-12-01 MED ORDER — MELATONIN 3 MG PO TABS
3.0000 mg | ORAL_TABLET | Freq: Every evening | ORAL | Status: DC | PRN
  Administered 2021-12-02: 3 mg via ORAL
  Filled 2021-12-01: qty 1

## 2021-12-01 MED ORDER — ACETAMINOPHEN 650 MG RE SUPP: 650.0000 mg | Freq: Four times a day (QID) | RECTAL | Status: DC | PRN

## 2021-12-01 MED ORDER — MORPHINE SULFATE (PF) 4 MG/ML IV SOLN
4.0000 mg | Freq: Once | INTRAVENOUS | Status: AC
  Administered 2021-12-01: 4 mg via INTRAVENOUS
  Filled 2021-12-01: qty 1

## 2021-12-01 MED ORDER — TRAMADOL HCL 50 MG PO TABS
50.0000 mg | ORAL_TABLET | Freq: Four times a day (QID) | ORAL | Status: DC | PRN
  Administered 2021-12-03 (×2): 50 mg via ORAL
  Filled 2021-12-01 (×2): qty 1

## 2021-12-01 MED ORDER — SIMETHICONE 80 MG PO CHEW
40.0000 mg | CHEWABLE_TABLET | Freq: Four times a day (QID) | ORAL | Status: DC | PRN
  Administered 2021-12-04: 40 mg via ORAL
  Filled 2021-12-01: qty 1

## 2021-12-01 MED ORDER — SODIUM CHLORIDE 0.9 % IV BOLUS
1000.0000 mL | Freq: Once | INTRAVENOUS | Status: AC
  Administered 2021-12-01: 1000 mL via INTRAVENOUS

## 2021-12-01 MED ORDER — CITALOPRAM HYDROBROMIDE 20 MG PO TABS
20.0000 mg | ORAL_TABLET | Freq: Every day | ORAL | Status: DC
  Administered 2021-12-01 – 2021-12-05 (×4): 20 mg via ORAL
  Filled 2021-12-01 (×3): qty 1
  Filled 2021-12-01: qty 2

## 2021-12-01 MED ORDER — DIPHENHYDRAMINE HCL 50 MG/ML IJ SOLN: 25.0000 mg | Freq: Four times a day (QID) | INTRAMUSCULAR | Status: DC | PRN

## 2021-12-01 MED ORDER — ONDANSETRON HCL 4 MG/2ML IJ SOLN: 4.0000 mg | Freq: Four times a day (QID) | INTRAMUSCULAR | Status: DC | PRN

## 2021-12-01 MED ORDER — HYDROMORPHONE HCL 1 MG/ML IJ SOLN
0.5000 mg | INTRAMUSCULAR | Status: DC | PRN
  Administered 2021-12-01 – 2021-12-03 (×16): 1 mg via INTRAVENOUS
  Filled 2021-12-01 (×16): qty 1

## 2021-12-01 MED ORDER — ENOXAPARIN SODIUM 40 MG/0.4ML IJ SOSY
40.0000 mg | PREFILLED_SYRINGE | INTRAMUSCULAR | Status: DC
  Administered 2021-12-03 – 2021-12-05 (×3): 40 mg via SUBCUTANEOUS
  Filled 2021-12-01 (×3): qty 0.4

## 2021-12-01 MED ORDER — METHOCARBAMOL 1000 MG/10ML IJ SOLN: 500.0000 mg | Freq: Three times a day (TID) | INTRAVENOUS | Status: DC | PRN

## 2021-12-01 NOTE — ED Notes (Signed)
Pt placed on 2L [place] for 86% on RA after medication. O2 up to 95% ?

## 2021-12-01 NOTE — ED Triage Notes (Signed)
Pt from home. Brought in by EMS. Pt was d/c'd from hospital on Friday 11/29/2021 after major abd surgery (11/05/2021?) following hysterectomy on 10/25/2021. Pt states that something went wrong with hysterectomy and that caused damage to the bowel.  ? ?Pt coming in today d/t wound care nurse visit this morning and when the nurse pressed on her abd a large amount of purulent drainage came out of the incision on the abd.  ? ?Pt also has JP drains and states the left has had mostly purulent drainage and the right has little to none drainage.   ?

## 2021-12-01 NOTE — Progress Notes (Addendum)
S: Patient was referred back to the emergency room by the home health care nurse who noted increasing drainage from her midline wound.  Patient states that she has had stable pain since discharge this past Friday and notes a low-grade temperature, although she has been taking Tylenol.  She has been eating but not much.  Her stoma output has been low the last couple days but is now productive. ? ?O: ?Temperature 98.8 ?Heart rate 102 ?Blood pressure 120/68 ? ?WBC 8.1 (6.26 days ago when last checked) ?Hemoglobin 9.1 (7.3 last checked on 4/17) ?Platelets 709 (579) ? ?Gen: A&Ox3, no distress  ?H&N: EOMI, atraumatic, neck supple ?Chest: unlabored respirations, RRR ?Abd: soft, minimally tender, nondistended, midline wound with retention sutures in place, some early granulation tissue is present.  About 5 cm from the inferior aspect of the wound is a small defect in the granulation tissue with copious purulent output, after this was expressed, there was not any ongoing drainage.  There is an additional opening at the most superior aspect of the wound with minimal drainage but she has some burning pain in this area.  There is no surrounding erythema, no induration or warmth. ?Ext: warm, no edema ?Neuro: grossly normal ? ?Lines/tubes/drains: Left-sided drain with purulent output, right-sided drain with minimal serous output, stoma with formed stool in the bag, multiple retention sutures in place ? ?A/P: 56 year old woman recently discharged from the surgery service 2 days ago after undergoing exploratory laparotomy for perforated sigmoid colon which was a complication of prior hysterectomy, postoperative course complicated by fascial dehiscence requiring return to the OR and intra-abdominal abscesses requiring drain placement. ?Returns with increased drainage and reported low-grade fever at home. ? ?Agree with current plans CT scan, hopefully this drainage is a self-limited abscess, CT will be helpful to rule out  enterocutaneous fistula. ? ?Addendum 14:29: CT with enlarged perihepatic abscess which I think would benefit from drain placement. No obvious fistula but there is a loop of bowel very close to the area of drainage on the abdominal wall. Will admit and consult IR for new drain placement (possible removal of current right sided drain) and resume local wound care with wicking to midline wound, try to quantify output and monitor for  further evidence of ECF. ? ? ?Romana Juniper, MD FACS ?[facility] ? ?  ?

## 2021-12-01 NOTE — ED Notes (Signed)
Pt refused gabapentin d/t the way it makes her feel. ?

## 2021-12-01 NOTE — ED Notes (Signed)
Pt in CT.

## 2021-12-01 NOTE — Plan of Care (Signed)

## 2021-12-01 NOTE — ED Notes (Signed)
Back from CT

## 2021-12-01 NOTE — ED Provider Notes (Signed)
?[place] ?Provider Note ? ? ?CSN: [id number] ?Arrival date & time: 12/01/21  1048 ? ?  ? ?History ? ?Chief Complaint  ?Patient presents with  ? Abdominal Pain  ? ? ?Michele [name] is a 56 y.o. female. ? ? ?Abdominal Pain ? ?Patient has history of hypertension anemia anxiety.  Patient's had recent surgical complications.  Patient had a hysterectomy performed on March 17.  Patient ended up developing complications from a bowel perforation following her hysterectomy.  She was admitted to the hospital on March 28 when that was diagnosed.  Patient ended up having an exploratory laparotomy sigmoid colectomy colostomy and Hartman's procedure.  On April 3 she had a CT-guided abdominal drain placed.  On April 6 she had repeat exploratory laparotomy with abdominal cavity washout and drainage of intra-abdominal abscess with retention suture closure of the abdominal wall.  Patient was discharged on April 21.  Home health assessed the patient today.  They felt that she had increased drainage from her abdominal wound.  Patient was having tenderness in her abdominal wound.  They measured a low-grade temperature of 100 and felt she needed to be reevaluated and was instructed go to the ED.  Patient states her abdominal pain is about the same as it was when she left the hospital but it is no better.  She has not had any high-grade fevers.  She has not had any vomiting.  She has been taking ibuprofen around-the-clock and wonders if that could be masking her fever ? ?Home Medications ?Prior to Admission medications   ?Medication Sig Start Date End Date Taking? Authorizing Provider  ?acetaminophen (TYLENOL) 500 MG tablet Take 2 tablets (1,000 mg total) by mouth every 6 (six) hours as needed for up to 14 days for mild pain or moderate pain. Do NOT take with combination oxycodone- acetaminophen (percocet) but can take with oxycodone by itself 11/29/21 12/13/21  Winferd Humphrey, PA-C  ?ADDERALL  XR 30 MG 24 hr capsule Take 30 mg by mouth daily. 09/23/21   [provider]  ?citalopram (CELEXA) 20 MG tablet Take 20 mg by mouth daily.    [provider]  ?CRESTOR 5 MG tablet TAKE 1 TABLET BY MOUTH EVERY DAY ?Patient taking differently: Take 5 mg by mouth daily. 04/17/15   Dorothy Spark, MD  ?ibuprofen (ADVIL) 600 MG tablet Take 1 tablet (600 mg total) by mouth every 6 (six) hours as needed for cramping or moderate pain. 10/28/21   Sanjuana Kava, MD  ?lidocaine (LIDODERM) 5 % Place 1 patch onto the skin daily for 14 days. Remove & Discard patch within 12 hours or as directed by MD 11/30/21 12/14/21  Winferd Humphrey, PA-C  ?methocarbamol (ROBAXIN) 500 MG tablet Take 2 tablets (1,000 mg total) by mouth every 6 (six) hours as needed for up to 7 days for muscle spasms. 11/29/21 12/06/21  Winferd Humphrey, PA-C  ?Oxycodone HCl 10 MG TABS Take 0.5-1 tablets (5-10 mg total) by mouth every 6 (six) hours as needed for up to 5 days for moderate pain or severe pain (5 mg for moderate pain, '10mg'$  for severe pain). 11/29/21 12/04/21  Winferd Humphrey, PA-C  ?   ? ?Allergies    ?Lipitor [atorvastatin] and Sulfa antibiotics   ? ?Review of Systems   ?Review of Systems  ?Gastrointestinal:  Positive for abdominal pain.  ? ?Physical Exam ?Updated Vital Signs ?BP 120/68   Pulse (!) 102   Temp 98.8 ?F (37.1 ?C) (  Oral)   Resp (!) 22   LMP 10/07/2021 (Approximate)   SpO2 (!) 85%  ?Physical Exam ?Vitals and nursing note reviewed.  ?Constitutional:   ?   Appearance: She is well-developed. She is not diaphoretic.  ?HENT:  ?   Head: Normocephalic and atraumatic.  ?   Right Ear: External ear normal.  ?   Left Ear: External ear normal.  ?Eyes:  ?   General: No scleral icterus.    ?   Right eye: No discharge.     ?   Left eye: No discharge.  ?   Conjunctiva/sclera: Conjunctivae normal.  ?Neck:  ?   Trachea: No tracheal deviation.  ?Cardiovascular:  ?   Rate and Rhythm: Normal rate and regular rhythm.  ?Pulmonary:  ?    Effort: Pulmonary effort is normal. No respiratory distress.  ?   Breath sounds: Normal breath sounds. No stridor. No wheezing or rales.  ?Abdominal:  ?   General: Bowel sounds are normal. There is no distension.  ?   Palpations: Abdomen is soft.  ?   Tenderness: There is abdominal tenderness. There is no guarding or rebound.  ?   Comments: Mucopurulent drainage noted in the JP drain, colostomy bag with brown stool, abdominal wound with pink viable granulation tissue at the base, retention sutures in place, white cloudy drainage at base of wound  ?Musculoskeletal:     ?   General: No tenderness or deformity.  ?   Cervical back: Neck supple.  ?Skin: ?   General: Skin is warm and dry.  ?   Findings: No rash.  ?Neurological:  ?   General: No focal deficit present.  ?   Mental Status: She is alert.  ?   Cranial Nerves: No cranial nerve deficit (no facial droop, extraocular movements intact, no slurred speech).  ?   Sensory: No sensory deficit.  ?   Motor: No abnormal muscle tone or seizure activity.  ?   Coordination: Coordination normal.  ?Psychiatric:     ?   Mood and Affect: Mood normal.  ? ? ?ED Results / Procedures / Treatments   ?Labs ?(all labs ordered are listed, but only abnormal results are displayed) ?Labs Reviewed  ?COMPREHENSIVE METABOLIC PANEL - Abnormal; Notable for the following components:  ?    Result Value  ? Glucose, Bld 152 (*)   ? Calcium 8.8 (*)   ? Albumin 2.2 (*)   ? All other components within normal limits  ?CBC WITH DIFFERENTIAL/PLATELET - Abnormal; Notable for the following components:  ? RBC 3.81 (*)   ? Hemoglobin 9.1 (*)   ? HCT 31.3 (*)   ? MCH 23.9 (*)   ? MCHC 29.1 (*)   ? RDW 16.3 (*)   ? Platelets 709 (*)   ? nRBC 0.7 (*)   ? Abs Immature Granulocytes 0.17 (*)   ? All other components within normal limits  ?LIPASE, BLOOD  ?URINALYSIS, ROUTINE W REFLEX MICROSCOPIC  ? ? ?EKG ?None ? ?Radiology ?CT ABDOMEN PELVIS W CONTRAST ? ?Result Date: 12/01/2021 ?CLINICAL DATA:  Peritonitis or  perforation suspected. EXAM: CT ABDOMEN AND PELVIS WITH CONTRAST TECHNIQUE: Multidetector CT imaging of the abdomen and pelvis was performed using the standard protocol following bolus administration of intravenous contrast. RADIATION DOSE REDUCTION: This exam was performed according to the departmental dose-optimization program which includes automated exposure control, adjustment of the mA and/or kV according to patient size and/or use of iterative reconstruction technique. CONTRAST:  168m OMNIPAQUE IOHEXOL  350 MG/ML SOLN COMPARISON:  November 20, 2021 FINDINGS: Lower chest: Patchy areas of airspace consolidation versus atelectasis in the lower lobes. Small right pleural effusion. Tiny left pleural effusion. Hepatobiliary: Increased in size of perihepatic subdiaphragmatic complex fluid collection containing foci of free gas, which now measures 8.6 x 5.1 cm (prior measurement of 7.3 x 3.0 cm). Pigtail catheter inferior to the liver hilum is in stable position. No residual fluid collection at the site. Normal attenuation of the liver. Normal gallbladder. Pancreas: Unremarkable. No pancreatic ductal dilatation or surrounding inflammatory changes. Spleen: Interval placement of a second pigtail drainage catheter within perisplenic collection which has decreased in size and contains multiple foci of gas. The collection measures 9.9 by 7.3 cm (prior measurement of 12.8 x 10.9 cm). The spleen demonstrates normal attenuation. Adrenals/Urinary Tract: Adrenal glands are unremarkable. Kidneys are normal, without renal calculi, focal lesion, or hydronephrosis. Bladder is unremarkable. Stomach/Bowel: Stomach is within normal limits. No evidence of bowel wall thickening, distention, or inflammatory changes. Stable postsurgical changes from partial left colectomy and descending colostomy. 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 Vascular/Lymphatic: No significant vascular findings are present. No enlarged abdominal or  pelvic lymph nodes. Reproductive: Status post hysterectomy. No adnexal masses. Other: Persistent thin linear enhancing fluid collection along the undersurface of the upper abdominal wall with maximum thickness of 1

## 2021-12-02 ENCOUNTER — Observation Stay (HOSPITAL_COMMUNITY): Payer: BC Managed Care – PPO

## 2021-12-02 ENCOUNTER — Encounter (HOSPITAL_COMMUNITY): Payer: Self-pay

## 2021-12-02 DIAGNOSIS — Z4682 Encounter for fitting and adjustment of non-vascular catheter: Secondary | ICD-10-CM | POA: Diagnosis not present

## 2021-12-02 DIAGNOSIS — K651 Peritoneal abscess: Secondary | ICD-10-CM | POA: Diagnosis not present

## 2021-12-02 DIAGNOSIS — K750 Abscess of liver: Secondary | ICD-10-CM | POA: Diagnosis not present

## 2021-12-02 HISTORY — PX: IR CATHETER TUBE CHANGE: IMG717

## 2021-12-02 HISTORY — PX: IR SINUS/FIST TUBE CHK-NON GI: IMG673

## 2021-12-02 HISTORY — PX: IR GUIDED DRAIN W CATHETER PLACEMENT: IMG719

## 2021-12-02 LAB — PROTIME-INR
INR: 1 (ref 0.8–1.2)
Prothrombin Time: 12.9 seconds (ref 11.4–15.2)

## 2021-12-02 LAB — CBC
HCT: 24.1 % — ABNORMAL LOW (ref 36.0–46.0)
Hemoglobin: 7.2 g/dL — ABNORMAL LOW (ref 12.0–15.0)
MCH: 23.8 pg — ABNORMAL LOW (ref 26.0–34.0)
MCHC: 29.9 g/dL — ABNORMAL LOW (ref 30.0–36.0)
MCV: 79.8 fL — ABNORMAL LOW (ref 80.0–100.0)
Platelets: 557 10*3/uL — ABNORMAL HIGH (ref 150–400)
RBC: 3.02 MIL/uL — ABNORMAL LOW (ref 3.87–5.11)
RDW: 16.2 % — ABNORMAL HIGH (ref 11.5–15.5)
WBC: 8.2 10*3/uL (ref 4.0–10.5)
nRBC: 0.4 % — ABNORMAL HIGH (ref 0.0–0.2)

## 2021-12-02 LAB — GLUCOSE, CAPILLARY
Glucose-Capillary: 110 mg/dL — ABNORMAL HIGH (ref 70–99)
Glucose-Capillary: 125 mg/dL — ABNORMAL HIGH (ref 70–99)
Glucose-Capillary: 137 mg/dL — ABNORMAL HIGH (ref 70–99)
Glucose-Capillary: 145 mg/dL — ABNORMAL HIGH (ref 70–99)

## 2021-12-02 LAB — BASIC METABOLIC PANEL
Anion gap: 7 (ref 5–15)
BUN: 5 mg/dL — ABNORMAL LOW (ref 6–20)
CO2: 26 mmol/L (ref 22–32)
Calcium: 8.2 mg/dL — ABNORMAL LOW (ref 8.9–10.3)
Chloride: 105 mmol/L (ref 98–111)
Creatinine, Ser: 0.6 mg/dL (ref 0.44–1.00)
GFR, Estimated: >60 mL/min (ref >60)
Glucose, Bld: 128 mg/dL — ABNORMAL HIGH (ref 70–99)
Potassium: 3.8 mmol/L (ref 3.5–5.1)
Sodium: 138 mmol/L (ref 135–145)

## 2021-12-02 LAB — MAGNESIUM: Magnesium: 1.8 mg/dL (ref 1.7–2.4)

## 2021-12-02 MED ORDER — ORAL CARE MOUTH RINSE
15.0000 mL | Freq: Two times a day (BID) | OROMUCOSAL | Status: DC
  Administered 2021-12-02 – 2021-12-05 (×7): 15 mL via OROMUCOSAL

## 2021-12-02 MED ORDER — ALPRAZOLAM 0.25 MG PO TABS
0.5000 mg | ORAL_TABLET | Freq: Every day | ORAL | Status: DC | PRN
  Administered 2021-12-02: 0.5 mg via ORAL
  Filled 2021-12-02: qty 2

## 2021-12-02 MED ORDER — MIDAZOLAM HCL 2 MG/2ML IJ SOLN
INTRAMUSCULAR | Status: AC
  Filled 2021-12-02: qty 2

## 2021-12-02 MED ORDER — MIDAZOLAM HCL 2 MG/2ML IJ SOLN
INTRAMUSCULAR | Status: AC | PRN
  Administered 2021-12-02 (×3): 1 mg via INTRAVENOUS

## 2021-12-02 MED ORDER — FENTANYL CITRATE (PF) 100 MCG/2ML IJ SOLN
INTRAMUSCULAR | Status: AC | PRN
  Administered 2021-12-02 (×3): 50 ug via INTRAVENOUS

## 2021-12-02 MED ORDER — LIDOCAINE HCL (PF) 1 % IJ SOLN
INTRAMUSCULAR | Status: AC | PRN
  Administered 2021-12-02: 15 mL

## 2021-12-02 MED ORDER — SODIUM CHLORIDE 0.9% FLUSH
5.0000 mL | Freq: Three times a day (TID) | INTRAVENOUS | Status: DC
  Administered 2021-12-03 – 2021-12-05 (×6): 5 mL

## 2021-12-02 MED ORDER — FENTANYL CITRATE (PF) 100 MCG/2ML IJ SOLN
INTRAMUSCULAR | Status: AC
  Filled 2021-12-02: qty 2

## 2021-12-02 MED ORDER — LIDOCAINE HCL 1 % IJ SOLN
INTRAMUSCULAR | Status: AC
Start: 2021-12-02 — End: 2021-12-02
  Filled 2021-12-02: qty 20

## 2021-12-02 MED ORDER — IOHEXOL 300 MG/ML  SOLN
100.0000 mL | Freq: Once | INTRAMUSCULAR | Status: AC | PRN
  Administered 2021-12-02: 20 mL

## 2021-12-02 NOTE — Sedation Documentation (Addendum)
Complaining of 6/10 cramping pain to upper abd right to left with SOB at 1125. See VS. HOB elevated a bit more. Taken to nurses station to observe. Pain resolving to 4/10 right drain placement site, cramping and SOB resolving. Sp02 98%. Placed in teletracking. ? ? ? ? ? ? ? ? ? ?

## 2021-12-02 NOTE — Procedures (Signed)
Pre procedural Dx: Post op abscesses ?Post procedural Dx: Same ? ?Drain injection of right upper abdominal drain is negative for enteric fistula.  This drain was removed. ? ?Successful [place] and fluoro guided placement of right supra-hepatic 12 Fr drain yielding 90 cc of purulent, foul smelling fluid.   ? ?Successful fluoro guided exchange, up sizing and repositioning of now 14 Fr left upper abdominal quadrant drain yielding 75 cc of purulent fluid.  ? ?Both remaining drains connected to gravity bags.  ? ?EBL: Trace ?Complications: None immediate ? ?Ronny Bacon, MD ?Pager #: [phone] ? ? ?

## 2021-12-02 NOTE — Progress Notes (Addendum)
[facility] ?Progress Note ? ?   ?Subjective: ?CC:  ?NAEO. Pain reported to be about the same as yesterday. States she had to come to ED due to low grade fevers (<100) and increase in drainge from midline wound. Reports feeling slightly anxious, was recently started on xanax daily PRN which helps  ? ?Objective: ?Vital signs in last 24 hours: ?Temp:  [97.9 ?F (36.6 ?C)-99.7 ?F (37.6 ?C)] 98.5 ?F (36.9 ?C) (04/24 0458) ?Pulse Rate:  [91-104] 101 (04/24 0458) ?Resp:  [18-22] 18 (04/24 0458) ?BP: (101-133)/(61-78) 118/68 (04/24 0458) ?SpO2:  [85 %-99 %] 99 % (04/24 0458) ?  ? ?Intake/Output from previous day: ?04/23 0701 - 04/24 0700 ?In: 2397.2 [P.O.:240; I.V.:1114.3; IV Piggyback:1042.9] ?Out: 180 [Drains:30; JKKXF:818] ?Intake/Output this shift: ?No intake/output data recorded. ? ?PE: ?Gen:  Alert, NAD, pleasant cooperative ?Pulm:  Normal effort ?Abd: Soft, mild appropriate tenderness without peritonitis ? RUQ JP - clear/serous, looks like flush ? LUQ JP - purulent  ? Midline wound with retentions in place - some fascial separation superior apex of incision with  ?minimal drainage, fascial separation inferior portion of wound draining purulence. ? LLQ colostomy - formed stool in pouch, nonbloody ?Skin: warm and dry, no rashes  ?Psych: A&Ox3  ? ?Lab Results:  ?Recent Labs  ?  12/01/21 ?1146 12/02/21 ?2993  ?WBC 8.1 8.2  ?HGB 9.1* 7.2*  ?HCT 31.3* 24.1*  ?PLT 709* 557*  ? ?BMET ?Recent Labs  ?  12/01/21 ?1146 12/02/21 ?0707  ?NA 136 138  ?K 3.5 3.8  ?CL 101 105  ?CO2 23 26  ?GLUCOSE 152* 128*  ?BUN 7 5*  ?CREATININE 0.60 0.60  ?CALCIUM 8.8* 8.2*  ? ?PT/INR ?Recent Labs  ?  12/02/21 ?0707  ?LABPROT 12.9  ?INR 1.0  ? ?CMP  ?   ?Component Value Date/Time  ? NA 138 12/02/2021 0707  ? K 3.8 12/02/2021 0707  ? CL 105 12/02/2021 0707  ? CO2 26 12/02/2021 0707  ? GLUCOSE 128 (H) 12/02/2021 7169  ? BUN 5 (L) 12/02/2021 6789  ? CREATININE 0.60 12/02/2021 0707  ? CALCIUM 8.2 (L) 12/02/2021 0707  ? PROT 6.6 12/01/2021  1146  ? ALBUMIN 2.2 (L) 12/01/2021 1146  ? AST 17 12/01/2021 1146  ? ALT 17 12/01/2021 1146  ? ALKPHOS 106 12/01/2021 1146  ? BILITOT 0.5 12/01/2021 1146  ? GFRNONAA >60 12/02/2021 0707  ? ?Lipase  ?   ?Component Value Date/Time  ? LIPASE 26 12/01/2021 1146  ? ? ? ? ? ?Studies/Results: ?CT ABDOMEN PELVIS W CONTRAST ? ?Result Date: 12/01/2021 ?CLINICAL DATA:  Peritonitis or perforation suspected. EXAM: CT ABDOMEN AND PELVIS WITH CONTRAST TECHNIQUE: Multidetector CT imaging of the abdomen and pelvis was performed using the standard protocol following bolus administration of intravenous contrast. RADIATION DOSE REDUCTION: This exam was performed according to the departmental dose-optimization program which includes automated exposure control, adjustment of the mA and/or kV according to patient size and/or use of iterative reconstruction technique. CONTRAST:  152m OMNIPAQUE IOHEXOL 350 MG/ML SOLN COMPARISON:  November 20, 2021 FINDINGS: Lower chest: Patchy areas of airspace consolidation versus atelectasis in the lower lobes. Small right pleural effusion. Tiny left pleural effusion. Hepatobiliary: Increased in size of perihepatic subdiaphragmatic complex fluid collection containing foci of free gas, which now measures 8.6 x 5.1 cm (prior measurement of 7.3 x 3.0 cm). Pigtail catheter inferior to the liver hilum is in stable position. No residual fluid collection at the site. Normal attenuation of the liver. Normal gallbladder. Pancreas: Unremarkable.  No pancreatic ductal dilatation or surrounding inflammatory changes. Spleen: Interval placement of a second pigtail drainage catheter within perisplenic collection which has decreased in size and contains multiple foci of gas. The collection measures 9.9 by 7.3 cm (prior measurement of 12.8 x 10.9 cm). The spleen demonstrates normal attenuation. Adrenals/Urinary Tract: Adrenal glands are unremarkable. Kidneys are normal, without renal calculi, focal lesion, or hydronephrosis.  Bladder is unremarkable. Stomach/Bowel: Stomach is within normal limits. No evidence of bowel wall thickening, distention, or inflammatory changes. Stable postsurgical changes from partial left colectomy and descending colostomy. 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 Vascular/Lymphatic: No significant vascular findings are present. No enlarged abdominal or pelvic lymph nodes. Reproductive: Status post hysterectomy. No adnexal masses. Other: Persistent thin linear enhancing fluid collection along the undersurface of the upper abdominal wall with maximum thickness of 1 cm. Diminished residual inter lobe fluid collection in the right lower quadrant currently measures 1.8 x 1.1 cm. Diminished subcutaneous collections of gas. Residual foci of gas within the surgical scarring of the mid anterior abdominal wall, with persistent soft tissue attenuation, likely representing small infected collections along surgical scarring. Musculoskeletal: No acute or significant osseous findings. IMPRESSION: 1. Small increase in the size of perihepatic subdiaphragmatic complex fluid collection containing foci of free gas, which now measures 8.6 x 5.1 cm (prior measurement of 7.3 x 3.0 cm). 2. Interval placement of a pigtail drainage catheter within the perisplenic collection which has decreased in size and contains multiple foci of gas. 3. Resolution of the perihepatic fluid collection at the site of the pre-existing pigtail catheter. 4. Diminished residual enhancing inter loop fluid collection in the right lower quadrant currently measuring 1.8 x 1.1 cm. 5. Persistent thin linear enhancing fluid collection along the undersurface of the upper abdominal wall with maximum thickness of 1 cm. 6. Residual foci of gas within the surgical scarring of the mid anterior abdominal wall, with persistent soft tissue attenuation, likely representing small infected collections along the scarring. 7. Diminished subcutaneous collections of  gas. 8. Patchy areas of airspace consolidation versus atelectasis in the lower lobes. Small right pleural effusion. Tiny left pleural effusion. Electronically Signed   By: Fidela Salisbury M.D.   On: 12/01/2021 14:13   ? ?Anti-infectives: ?Anti-infectives (From admission, onward)  ? ? Start     Dose/Rate Route Frequency Ordered Stop  ? 12/01/21 1430  piperacillin-tazobactam (ZOSYN) IVPB 3.375 g       ? 3.375 g ?12.5 mL/hr over 240 Minutes Intravenous Every 8 hours 12/01/21 1422 12/08/21 1359  ? ?  ? ?Assessment/Plan ?56 year old woman recently discharged from the surgery service 11/29/21 after undergoing exploratory laparotomy for perforated sigmoid diverticulitis 3/28, just 10 days after a hysterectomy. Her postoperative course complicated by fascial dehiscence requiring return to the OR for washout/retention sutures and intra-abdominal abscesses requiring drain placement. Returns with increased drainage and reported low-grade fever at home.  ?- afebrile, intermittent sinus tachycardia (101bpm), WBC WNL  ?- CT A/P 4/23 w/ increase in perihepatic fluid collection; continue current JP drains and consult IR for possible drainage of enlarging perihepatic abscess.  ?- NPO for now ?- IV Zosyn ?- PRN analgesics/antiemetics ?- moist to dry dressing changes twice daily and PRN ?- OOB to chair and mobilize as able  ? ? LOS: 0 days  ? ?I reviewed nursing notes, last 24 h vitals and pain scores, last 48 h intake and output, last 24 h labs and trends, and last 24 h imaging results. ? ? ?Obie Dredge, PA-C ?  [facility] ?Please see Amion for pager number during day hours 7:00am-4:30pm ? ? ? ? ? ? ?

## 2021-12-02 NOTE — TOC Progression Note (Signed)
Transition of Care (TOC) - Progression Note  ? ? ?Patient Details  ?Name: Michele [name] ?MRN: [id number] ?Date of Birth: 1966/02/04 ? ?Transition of Care (TOC) CM/SW Contact  ?Marilu Favre, RN ?Phone Number: ?12/02/2021, 11:27 AM ? ?Clinical Narrative:    ? ?Patient discharged last Friday with [facility] with [facility] and private pay RN through [facility].  ? ?Hoyle Sauer with [place] with [facility] both updated on patient's readmission  ? ?  ?  ? ?Expected Discharge Plan and Services ?  ?  ?  ?  ?  ?                ?  ?  ?  ?  ?  ?  ?  ?  ?  ?  ? ? ?Social Determinants of Health (SDOH) Interventions ?  ? ?Readmission Risk Interventions ?   ? View : No data to display.  ?  ?  ?  ? ? ?

## 2021-12-02 NOTE — Plan of Care (Signed)

## 2021-12-02 NOTE — Consult Note (Signed)
[place] Nurse ostomy consult note ?Patient receiving care in [place] ?Stoma type/location: LUQ colostomy ?Stomal assessment/size: oval 4 x 3 cm, stoma is retracted at 3 o'clock. 1 5/8" ?Peristomal assessment: Intact ?Treatment options for stomal/peristomal skin: Barrier ring, no sting barrier wipes ?Output: formed greenish ?Ostomy pouching: 1pc. Flex Convex Kellie Simmering # [phone]) ?Education provided: None provided previous admission.  ?Enrolled patient in [facility] program: Yes (previously) ? ?Thank you for the consult. [place] nurse will not follow at this time.   ?Please re-consult the [place] team if needed. ? ?Cathlean Marseilles. Tamala Julian, MSN, RN, CMSRN, AGCNS, WTA ?Wound Treatment Associate ?Pager [phone]   ?

## 2021-12-02 NOTE — Consult Note (Signed)
? ?Chief Complaint: ?Patient was seen in consultation today for intra-abdominal fluid collection ? ?Referring Physician(s): ?Dr. Romana Juniper ? ?Supervising Physician: Sandi Mariscal ? ?Patient Status: [facility] - In-pt ? ?History of Present Illness: ?Michele [name] is a 56 y.o. female with past medical history of anexity, anemia, s/p recent ex lap for perforation after hysterectomy with colectomy and colostomy creation.  She is known to IR from recent drain placements; 4/3 RLQ drain placement by Dr. Serafina Royals, 4/15 LUQ drain placement by Dr. Annamaria Boots.  She did have dehiscence of her abdominal midline incision and now has red rubber catheter re-approximation with wound care.  She was discharged Friday, however returned to the ED Sunday with increased drainage from her midline wound which prompted a CT scan.  Imaging shows development of a new subdiaphragmatic fluid collection, ongoing perisplenic fluid collection, resolved subhepatic fluid collection.  IR consulted for drain manipulation vs. Exchange vs. New placement.  ? ?Case reviewed by Dr. Pascal Lux who approves patient for right subhepatic collection aspiration and drainage.  ? ?Met with patient and family at bedside who are aware of CT findings and agreeable to procedure.  ?She has been NPO.  ? ?Past Medical History:  ?Diagnosis Date  ? Anemia   ? Anxiety   ? Hypertension   ? with pregnancies  ? ? ?Past Surgical History:  ?Procedure Laterality Date  ? CESAREAN SECTION    ? TWO of them  ? COLOSTOMY Left 11/05/2021  ? Procedure: COLOSTOMY;  Surgeon: Sanjuana Kava, MD;  Location: [place];  Service: Gynecology;  Laterality: Left;  ? CYSTOSCOPY  10/25/2021  ? Procedure: CYSTOSCOPY;  Surgeon: Sanjuana Kava, MD;  Location: [facility] OR;  Service: Gynecology;;  ? dental implants N/A 2016  ? lower 2 teeth  ? HYSTERECTOMY ABDOMINAL WITH SALPINGECTOMY  10/25/2021  ? Procedure: HYSTERECTOMY ABDOMINAL WITH BILATERAL SALPINGECTOMY;  Surgeon: Sanjuana Kava, MD;  Location: [place];  Service:  Gynecology;;  ? HYSTEROSCOPY WITH D & C  01/30/2017  ? Procedure: DILATATION AND CURETTAGE /HYSTEROSCOPY;  Surgeon: Sanjuana Kava, MD;  Location: [facility];  Service: Gynecology;;  ? LAPAROSCOPY  10/25/2021  ? Procedure: LAPAROSCOPY DIAGNOSTIC;  Surgeon: Sanjuana Kava, MD;  Location: [place];  Service: Gynecology;;  ? LAPAROTOMY N/A 11/05/2021  ? Procedure: EXPLORATORY LAPAROTOMY AND COLECTOMY ;  Surgeon: Sanjuana Kava, MD;  Location: [place];  Service: Gynecology;  Laterality: N/A;  ? LAPAROTOMY N/A 11/14/2021  ? Procedure: EXPLORATORY  LAPAROTOMY AND ABDOMINAL WALL CLOSURE;  Surgeon: Erroll Luna, MD;  Location: [place];  Service: General;  Laterality: N/A;  ? ? ?Allergies: ?Lipitor [atorvastatin] and Sulfa antibiotics ? ?Medications: ?Prior to Admission medications   ?Medication Sig Start Date End Date Taking? Authorizing Provider  ?ADDERALL XR 30 MG 24 hr capsule Take 30 mg by mouth daily. 09/23/21  Yes [provider]  ?citalopram (CELEXA) 20 MG tablet Take 20 mg by mouth daily.   Yes [provider]  ?CRESTOR 5 MG tablet TAKE 1 TABLET BY MOUTH EVERY DAY ?Patient taking differently: Take 5 mg by mouth daily. 04/17/15  Yes Dorothy Spark, MD  ?ibuprofen (ADVIL) 600 MG tablet Take 1 tablet (600 mg total) by mouth every 6 (six) hours as needed for cramping or moderate pain. 10/28/21  Yes Sanjuana Kava, MD  ?oxyCODONE-acetaminophen (PERCOCET/ROXICET) 5-325 MG tablet Take 1-2 tablets by mouth every 4 (four) hours as needed for severe pain or moderate pain. 10/28/21  Yes Sanjuana Kava, MD  ?simethicone (MYLICON) 638 MG chewable tablet Chew 125 mg  by mouth every 6 (six) hours as needed for flatulence.   Yes [provider]  ?  ? ?Family History  ?Problem Relation Age of Onset  ? Heart attack Father   ? Hypertension Sister   ? ? ?Social History  ? ?Socioeconomic History  ? Marital status: Legally Separated  ?  Spouse name: Not on file  ? Number of children: Not on file  ? Years of education: Not on file  ? Highest  education level: Not on file  ?Occupational History  ? Not on file  ?Tobacco Use  ? Smoking status: Former  ?  Packs/day: 0.50  ?  Years: 15.00  ?  Pack years: 7.50  ?  Types: Cigarettes  ?  Quit date: 2003  ?  Years since quitting: 20.3  ? Smokeless tobacco: Never  ? Tobacco comments:  ?  quit 2003  ?Vaping Use  ? Vaping Use: Never used  ?Substance and Sexual Activity  ? Alcohol use: No  ? Drug use: No  ? Sexual activity: Yes  ?  Birth control/protection: Surgical  ?  Comment: 2006  ?Other Topics Concern  ? Not on file  ?Social History Narrative  ? The pt is a homemaker -pt quit smoking cigarettes 2004   ? ?Social Determinants of Health  ? ?Financial Resource Strain: Not on file  ?Food Insecurity: Not on file  ?Transportation Needs: Not on file  ?Physical Activity: Not on file  ?Stress: Not on file  ?Social Connections: Not on file  ? ? ? ?Review of Systems: A 12 point ROS discussed and pertinent positives are indicated in the HPI above.  All other systems are negative. ? ?Review of Systems  ?Constitutional:  Positive for fatigue. Negative for fever.  ?Respiratory:  Negative for cough and shortness of breath.   ?Cardiovascular:  Negative for chest pain.  ?Gastrointestinal:  Positive for abdominal pain and nausea. Negative for vomiting.  ?Musculoskeletal:  Negative for back pain.  ?Psychiatric/Behavioral:  Negative for behavioral problems and confusion.   ? ?Vital Signs: ?BP 113/71 (BP Location: Left Arm)   Pulse 95   Temp 98.4 ?F (36.9 ?C) (Oral)   Resp 18   LMP 10/07/2021 (Approximate)   SpO2 98%  ? ?Physical Exam ?Vitals and nursing note reviewed.  ?Constitutional:   ?   General: She is not in acute distress. ?   Appearance: She is well-developed. She is ill-appearing.  ?Cardiovascular:  ?   Rate and Rhythm: Normal rate and regular rhythm.  ?Pulmonary:  ?   Effort: Pulmonary effort is normal. No respiratory distress.  ?Abdominal:  ?   Comments: RUQ drain in place. Insertion site intact.  Trace clear fluid in  bulb.  ?LUQ drain in place.  Insertion site intact.  Purulent, thick output in bulb.  ?Midline wound, increased drainage.   ?Skin: ?   General: Skin is warm and dry.  ?Neurological:  ?   General: No focal deficit present.  ?   Mental Status: She is alert and oriented to person, place, and time.  ?Psychiatric:     ?   Mood and Affect: Mood normal.     ?   Behavior: Behavior normal.  ? ? ? ?MD Evaluation ?Airway: WNL ?Heart: WNL ?Abdomen: WNL ?Chest/ Lungs: WNL ?ASA  Classification: 3 ?Mallampati/Airway Score: Two ? ? ?Imaging: ?CT Angio Chest PE W/Cm &/Or Wo Cm ? ?Result Date: 11/05/2021 ?CLINICAL DATA:  Abdominal pain. Possible pulmonary embolus. Status post hysterectomy 10 days ago. EXAM: CT  ANGIOGRAPHY CHEST CT ABDOMEN AND PELVIS WITH CONTRAST TECHNIQUE: Multidetector CT imaging of the chest was performed using the standard protocol during bolus administration of intravenous contrast. Multiplanar CT image reconstructions and MIPs were obtained to evaluate the vascular anatomy. Multidetector CT imaging of the abdomen and pelvis was performed using the standard protocol during bolus administration of intravenous contrast. RADIATION DOSE REDUCTION: This exam was performed according to the departmental dose-optimization program which includes automated exposure control, adjustment of the mA and/or kV according to patient size and/or use of iterative reconstruction technique. CONTRAST:  175m OMNIPAQUE IOHEXOL 350 MG/ML SOLN COMPARISON:  None. FINDINGS: CTA CHEST FINDINGS Cardiovascular: Satisfactory opacification of the pulmonary arteries to the segmental level. No evidence of pulmonary embolism. Normal heart size. No pericardial effusion. Mediastinum/Nodes: No enlarged mediastinal, hilar, or axillary lymph nodes. Thyroid gland, trachea, and esophagus demonstrate no significant findings. Lungs/Pleura: No pneumothorax or pleural effusion is noted. Mild bibasilar subsegmental atelectasis is noted. Musculoskeletal: No  chest wall abnormality. No acute or significant osseous findings. Review of the MIP images confirms the above findings. CT ABDOMEN and PELVIS FINDINGS Hepatobiliary: No focal liver abnormality is seen. No gallstones,

## 2021-12-03 LAB — BASIC METABOLIC PANEL
Anion gap: 8 (ref 5–15)
BUN: 6 mg/dL (ref 6–20)
CO2: 28 mmol/L (ref 22–32)
Calcium: 8.6 mg/dL — ABNORMAL LOW (ref 8.9–10.3)
Chloride: 99 mmol/L (ref 98–111)
Creatinine, Ser: 0.67 mg/dL (ref 0.44–1.00)
GFR, Estimated: >60 mL/min (ref >60)
Glucose, Bld: 137 mg/dL — ABNORMAL HIGH (ref 70–99)
Potassium: 3.4 mmol/L — ABNORMAL LOW (ref 3.5–5.1)
Sodium: 135 mmol/L (ref 135–145)

## 2021-12-03 LAB — GLUCOSE, CAPILLARY
Glucose-Capillary: 143 mg/dL — ABNORMAL HIGH (ref 70–99)
Glucose-Capillary: 147 mg/dL — ABNORMAL HIGH (ref 70–99)
Glucose-Capillary: 160 mg/dL — ABNORMAL HIGH (ref 70–99)

## 2021-12-03 LAB — CBC
HCT: 26.3 % — ABNORMAL LOW (ref 36.0–46.0)
Hemoglobin: 7.8 g/dL — ABNORMAL LOW (ref 12.0–15.0)
MCH: 24.2 pg — ABNORMAL LOW (ref 26.0–34.0)
MCHC: 29.7 g/dL — ABNORMAL LOW (ref 30.0–36.0)
MCV: 81.7 fL (ref 80.0–100.0)
Platelets: 537 10*3/uL — ABNORMAL HIGH (ref 150–400)
RBC: 3.22 MIL/uL — ABNORMAL LOW (ref 3.87–5.11)
RDW: 16.3 % — ABNORMAL HIGH (ref 11.5–15.5)
WBC: 6.2 10*3/uL (ref 4.0–10.5)
nRBC: 0 % (ref 0.0–0.2)

## 2021-12-03 LAB — IRON AND TIBC
Iron: 17 ug/dL — ABNORMAL LOW (ref 28–170)
Saturation Ratios: 9 % — ABNORMAL LOW (ref 10.4–31.8)
TIBC: 193 ug/dL — ABNORMAL LOW (ref 250–450)
UIBC: 176 ug/dL

## 2021-12-03 LAB — FERRITIN: Ferritin: 86 ng/mL (ref 11–307)

## 2021-12-03 MED ORDER — LIDOCAINE 5 % EX PTCH
1.0000 | MEDICATED_PATCH | CUTANEOUS | Status: DC
  Administered 2021-12-03 – 2021-12-04 (×2): 1 via TRANSDERMAL
  Filled 2021-12-03 (×3): qty 1

## 2021-12-03 MED ORDER — POLYETHYLENE GLYCOL 3350 17 G PO PACK
17.0000 g | PACK | Freq: Two times a day (BID) | ORAL | Status: DC
  Administered 2021-12-03 – 2021-12-05 (×4): 17 g via ORAL
  Filled 2021-12-03 (×5): qty 1

## 2021-12-03 MED ORDER — METHOCARBAMOL 500 MG PO TABS
1000.0000 mg | ORAL_TABLET | Freq: Four times a day (QID) | ORAL | Status: DC
  Administered 2021-12-03 – 2021-12-05 (×11): 1000 mg via ORAL
  Filled 2021-12-03 (×11): qty 2

## 2021-12-03 MED ORDER — KETOROLAC TROMETHAMINE 30 MG/ML IJ SOLN
30.0000 mg | Freq: Three times a day (TID) | INTRAMUSCULAR | Status: DC
  Administered 2021-12-03 – 2021-12-05 (×7): 30 mg via INTRAVENOUS
  Filled 2021-12-03 (×7): qty 1

## 2021-12-03 MED ORDER — ASCORBIC ACID 500 MG PO TABS
500.0000 mg | ORAL_TABLET | Freq: Two times a day (BID) | ORAL | Status: DC
  Administered 2021-12-03 – 2021-12-05 (×5): 500 mg via ORAL
  Filled 2021-12-03 (×5): qty 1

## 2021-12-03 MED ORDER — POLYETHYLENE GLYCOL 3350 17 G PO PACK: 17.0000 g | PACK | Freq: Two times a day (BID) | ORAL | Status: DC

## 2021-12-03 MED ORDER — ACETAMINOPHEN 500 MG PO TABS
1000.0000 mg | ORAL_TABLET | Freq: Four times a day (QID) | ORAL | Status: DC
  Administered 2021-12-03 – 2021-12-05 (×8): 1000 mg via ORAL
  Filled 2021-12-03 (×10): qty 2

## 2021-12-03 MED ORDER — FERROUS SULFATE 325 (65 FE) MG PO TABS
325.0000 mg | ORAL_TABLET | Freq: Every day | ORAL | Status: DC
  Administered 2021-12-04 – 2021-12-05 (×2): 325 mg via ORAL
  Filled 2021-12-03 (×2): qty 1

## 2021-12-03 MED ORDER — POTASSIUM CHLORIDE 20 MEQ PO PACK
60.0000 meq | PACK | Freq: Once | ORAL | Status: AC
  Administered 2021-12-03: 60 meq via ORAL
  Filled 2021-12-03: qty 3

## 2021-12-03 NOTE — TOC Initial Note (Signed)
Transition of Care (TOC) - Initial/Assessment Note  ? ? ?Patient Details  ?Name: Michele [name] ?MRN: [id number] ?Date of Birth: 11-Feb-1966 ? ?Transition of Care (TOC) CM/SW Contact:    ?Marilu Favre, RN ?Phone Number: ?12/03/2021, 10:44 AM ? ?Clinical Narrative:                 ?Spoke to patient at bedside. See previous notes.  ? ?Patient from home with two teenagers part time. Her ex husband [name] assists her.  ? ?Patient discharged last Friday with [facility] and private pay RN through [facility].  ?  ?Hoyle Sauer with [place] with [facility] both updated on patient's readmission  ?   ? ?Expected Discharge Plan: [place] ?Barriers to Discharge: Continued Medical Work up ? ? ?Patient Goals and CMS Choice ?Patient states their goals for this hospitalization and ongoing recovery are:: to return to home ?CMS Medicare.gov Compare Post Acute Care list provided to:: Patient ?Choice offered to / list presented to : Patient ? ?Expected Discharge Plan and Services ?Expected Discharge Plan: [place] ?  ?Discharge Planning Services: CM Consult ?Post Acute Care Choice: Home Health ?Living arrangements for the past 2 months: [place] ?                ?DME Arranged: N/A ?  ?  ?  ?  ?HH Arranged: RN ?  ?  ?  ?  ? ?Prior Living Arrangements/Services ?Living arrangements for the past 2 months: [place] ?Lives with:: Self ?Patient language and need for interpreter reviewed:: Yes ?Do you feel safe going back to the place where you live?: Yes      ?Need for Family Participation in Patient Care: Yes (Comment) ?Care giver support system in place?: Yes (comment) ?Current home services: DME ?Criminal Activity/Legal Involvement Pertinent to Current Situation/Hospitalization: No - Comment as needed ? ?Activities of Daily Living ?Home Assistive Devices/Equipment: None ?ADL Screening (condition at time of admission) ?Patient's  cognitive ability adequate to safely complete daily activities?: Yes ?Is the patient deaf or have difficulty hearing?: No ?Does the patient have difficulty seeing, even when wearing glasses/contacts?: No ?Does the patient have difficulty concentrating, remembering, or making decisions?: No ?Patient able to express need for assistance with ADLs?: Yes ?Does the patient have difficulty dressing or bathing?: No ?Independently performs ADLs?: Yes (appropriate for developmental age) ?Communication: Independent ?Does the patient have difficulty walking or climbing stairs?: No ?Weakness of Legs: None ?Weakness of Arms/Hands: None ? ?Permission Sought/Granted ?  ?Permission granted to share information with : No ?   ?   ?   ?   ? ?Emotional Assessment ?Appearance:: Appears stated age ?Attitude/Demeanor/Rapport: Engaged ?Affect (typically observed): Accepting ?Orientation: : Oriented to Situation, Oriented to  Time, Oriented to Place, Oriented to Self ?Alcohol / Substance Use: Not Applicable ?Psych Involvement: No (comment) ? ?Admission diagnosis:  Intra-abdominal abscess ([place]) [K65.1] ?Patient Active Problem List  ? Diagnosis Date Noted  ? Intra-abdominal abscess ([place]) 12/01/2021  ? Bowel perforation ([place]) 11/05/2021  ? Abnormal uterine and vaginal bleeding, unspecified 10/25/2021  ? 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 Atypical chest pain 06/29/2013  ? Hyperlipidemia 06/15/2013  ? Chest pain 06/15/2013  ? ?PCP:  Sanjuana Kava, MD ?Pharmacy:   ?[id number] [place] [facility] [address]?Phone: [phone]: [phone]? ?[facility] ?[address] ?[place] [address] ?Phone: [phone] Fax: [phone] ? ? ? ? ?Social Determinants of Health (SDOH) Interventions ?  ? ?Readmission Risk Interventions ?   ? View : No data to display.  ?  ?  ?  ? ? ? ?

## 2021-12-03 NOTE — Progress Notes (Signed)
? ? ?Referring Physician(s): Dr. Romana Juniper ? ?Supervising Physician: Michaelle Birks ? ?Patient Status:  [facility] - In-pt ? ?Chief Complaint: ? ?S/p ex lap colectomy and colostomy creation on 2/94, recovery complicated by intra-abdominal fluid collection development,  ? ?- S/p RUQ drain placement by Dr. Serafina Royals on 11/11/21 and LUQ drain placement by Dr. Annamaria Boots on 11/23/21 ? ?-Patient was discharged on 4/21, returned to ED on 4/24 CT showed development of a new subdiaphragmatic fluid collection, ongoing perisplenic fluid collection, resolved subhepatic fluid collection ? ?- S/p removal of old RUQ drain, new RUQ drain placement, upsizing and repositioning of the LUQ drain by Dr. Pascal Lux on 4/24.  ? ?Subjective: ? ?Pt sitting in a chair, NAD. Visitor at bedside.  ?Reports that she feels little more sore around the drains this time.  ?Reports the soreness is probably due to the drains she currently has is bigger than the previous ones, informed the patient that she may feel sore for couple days or longer.  ? ? ?Allergies: ?Lipitor [atorvastatin] and Sulfa antibiotics ? ?Medications: ?Prior to Admission medications   ?Medication Sig Start Date End Date Taking? Authorizing Provider  ?ADDERALL XR 30 MG 24 hr capsule Take 30 mg by mouth daily. 09/23/21  Yes [provider]  ?citalopram (CELEXA) 20 MG tablet Take 20 mg by mouth daily.   Yes [provider]  ?CRESTOR 5 MG tablet TAKE 1 TABLET BY MOUTH EVERY DAY ?Patient taking differently: Take 5 mg by mouth daily. 04/17/15  Yes Dorothy Spark, MD  ?ibuprofen (ADVIL) 600 MG tablet Take 1 tablet (600 mg total) by mouth every 6 (six) hours as needed for cramping or moderate pain. 10/28/21  Yes Sanjuana Kava, MD  ?oxyCODONE-acetaminophen (PERCOCET/ROXICET) 5-325 MG tablet Take 1-2 tablets by mouth every 4 (four) hours as needed for severe pain or moderate pain. 10/28/21  Yes Sanjuana Kava, MD  ?simethicone (MYLICON) 765 MG chewable tablet Chew 125 mg by mouth every 6 (six)  hours as needed for flatulence.   Yes [provider]  ? ? ? ?Vital Signs: ?BP 111/77 (BP Location: Left Arm)   Pulse 85   Temp (!) 97.5 ?F (36.4 ?C) (Oral)   Resp 17   LMP 10/07/2021 (Approximate)   SpO2 100%  ? ?Physical Exam ?Vitals reviewed.  ?Constitutional:   ?   General: She is not in acute distress. ?   Appearance: She is well-developed. She is not ill-appearing.  ?HENT:  ?   Head: Normocephalic and atraumatic.  ?Pulmonary:  ?   Effort: Pulmonary effort is normal.  ?Abdominal:  ?   Palpations: Abdomen is soft.  ?Skin: ?   General: Skin is warm and dry.  ?   Coloration: Skin is not cyanotic or pale.  ?   Comments: Positive RUQ drain to a gravity bag. Dressing is clean, dry, and intact. 20 ml of  purulent colored fluid noted in the bag. Drain aspirates and flushes well.  ? ?Positive left flank drain to a gravity bag.  Dressing is clean, dry, and intact. 5 ml of  purulent fluid noted in the bag. Drain aspirates and flushes well.  ?  ?Neurological:  ?   Mental Status: She is alert and oriented to person, place, and time.  ?Psychiatric:     ?   Mood and Affect: Mood normal.     ?   Behavior: Behavior normal.  ? ? ?Imaging: ?CT ABDOMEN PELVIS W CONTRAST ? ?Result Date: 12/01/2021 ?CLINICAL DATA:  Peritonitis or perforation  suspected. EXAM: CT ABDOMEN AND PELVIS WITH CONTRAST TECHNIQUE: Multidetector CT imaging of the abdomen and pelvis was performed using the standard protocol following bolus administration of intravenous contrast. RADIATION DOSE REDUCTION: This exam was performed according to the departmental dose-optimization program which includes automated exposure control, adjustment of the mA and/or kV according to patient size and/or use of iterative reconstruction technique. CONTRAST:  1102m OMNIPAQUE IOHEXOL 350 MG/ML SOLN COMPARISON:  November 20, 2021 FINDINGS: Lower chest: Patchy areas of airspace consolidation versus atelectasis in the lower lobes. Small right pleural effusion. Tiny left  pleural effusion. Hepatobiliary: Increased in size of perihepatic subdiaphragmatic complex fluid collection containing foci of free gas, which now measures 8.6 x 5.1 cm (prior measurement of 7.3 x 3.0 cm). Pigtail catheter inferior to the liver hilum is in stable position. No residual fluid collection at the site. Normal attenuation of the liver. Normal gallbladder. Pancreas: Unremarkable. No pancreatic ductal dilatation or surrounding inflammatory changes. Spleen: Interval placement of a second pigtail drainage catheter within perisplenic collection which has decreased in size and contains multiple foci of gas. The collection measures 9.9 by 7.3 cm (prior measurement of 12.8 x 10.9 cm). The spleen demonstrates normal attenuation. Adrenals/Urinary Tract: Adrenal glands are unremarkable. Kidneys are normal, without renal calculi, focal lesion, or hydronephrosis. Bladder is unremarkable. Stomach/Bowel: Stomach is within normal limits. No evidence of bowel wall thickening, distention, or inflammatory changes. Stable postsurgical changes from partial left colectomy and descending colostomy. 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 Vascular/Lymphatic: No significant vascular findings are present. No enlarged abdominal or pelvic lymph nodes. Reproductive: Status post hysterectomy. No adnexal masses. Other: Persistent thin linear enhancing fluid collection along the undersurface of the upper abdominal wall with maximum thickness of 1 cm. Diminished residual inter lobe fluid collection in the right lower quadrant currently measures 1.8 x 1.1 cm. Diminished subcutaneous collections of gas. Residual foci of gas within the surgical scarring of the mid anterior abdominal wall, with persistent soft tissue attenuation, likely representing small infected collections along surgical scarring. Musculoskeletal: No acute or significant osseous findings. IMPRESSION: 1. Small increase in the size of perihepatic  subdiaphragmatic complex fluid collection containing foci of free gas, which now measures 8.6 x 5.1 cm (prior measurement of 7.3 x 3.0 cm). 2. Interval placement of a pigtail drainage catheter within the perisplenic collection which has decreased in size and contains multiple foci of gas. 3. Resolution of the perihepatic fluid collection at the site of the pre-existing pigtail catheter. 4. Diminished residual enhancing inter loop fluid collection in the right lower quadrant currently measuring 1.8 x 1.1 cm. 5. Persistent thin linear enhancing fluid collection along the undersurface of the upper abdominal wall with maximum thickness of 1 cm. 6. Residual foci of gas within the surgical scarring of the mid anterior abdominal wall, with persistent soft tissue attenuation, likely representing small infected collections along the scarring. 7. Diminished subcutaneous collections of gas. 8. Patchy areas of airspace consolidation versus atelectasis in the lower lobes. Small right pleural effusion. Tiny left pleural effusion. Electronically Signed   By: DFidela SalisburyM.D.   On: 12/01/2021 14:13  ? ?IR Sinus/Fist Tube Chk-Non GI ? ?Result Date: 12/02/2021 ?CLINICAL DATA:  History of hysterectomy complicated bowel injury, requiring placement a right mid abdominal drainage catheter on 11/11/2021 as well as a left upper quadrant percutaneous catheter on 11/23/2021. Patient was discharged from the hospital however returned 2 days later with CT scan of the abdomen and pelvis performed 12/01/2021 demonstrating resolution of  the collection previously surrounding the right upper abdominal drainage catheter. 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 Patient presents today for drainage catheter injection of existing right-sided drainage catheter, drainage catheter injection, exchange, up sizing and repositioning  of the left percutaneous drainage catheter as well as image guided attempted placement of a supra hepatic drainage catheter. EXAM: IR CATHETER TUBE CHANGE; ABSCESS DRAINAGE; SINUS TRACT INJECTION/FISTULOGRAM COMPARISON:  C

## 2021-12-03 NOTE — Progress Notes (Addendum)
[facility] ?Progress Note ? ?   ?Subjective: ?CC:  ?Having some pain around new drain sites. Reports decreased appetite but denies nausea or vomiting.  ? ?Objective: ?Vital signs in last 24 hours: ?Temp:  [97.5 ?F (36.4 ?C)-98.7 ?F (37.1 ?C)] 97.5 ?F (36.4 ?C) (04/25 0809) ?Pulse Rate:  [84-99] 85 (04/25 0809) ?Resp:  [16-19] 17 (04/25 0809) ?BP: (101-125)/(63-84) 111/77 (04/25 0809) ?SpO2:  [95 %-100 %] 100 % (04/25 0809) ?Last BM Date : 12/02/21 ? ?Intake/Output from previous day: ?04/24 0701 - 04/25 0700 ?In: 737.4 [I.V.:582.5; IV Piggyback:149.9] ?Out: 250 [Drains:250] ?Intake/Output this shift: ?Total I/O ?In: 220 [P.O.:220] ?Out: -  ? ?PE: ?Gen:  Alert, NAD, pleasant cooperative ?Pulm:  Normal effort ?Abd: Soft, mild appropriate tenderness without peritonitis ? RUQ JP - purulent ? LUQ JP - purulent  ? Midline wound with retentions in place - some fascial separation superior apex of incision with  ?purulent drainage, fascial separation inferior portion of wound draining purulence. ? 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 ?Lab Results:  ?Recent Labs  ?  12/02/21 ?7858 12/03/21 ?0804  ?WBC 8.2 6.2  ?HGB 7.2* 7.8*  ?HCT 24.1* 26.3*  ?PLT 557* 537*  ? ?BMET ?Recent Labs  ?  12/02/21 ?0707 12/03/21 ?0804  ?NA 138 135  ?K 3.8 3.4*  ?CL 105 99  ?CO2 26 28  ?GLUCOSE 128* 137*  ?BUN 5* 6  ?CREATININE 0.60 0.67  ?CALCIUM 8.2* 8.6*  ? ?PT/INR ?Recent Labs  ?  12/02/21 ?0707  ?LABPROT 12.9  ?INR 1.0  ? ?CMP  ?   ?Component Value Date/Time  ? NA 135 12/03/2021 0804  ? K 3.4 (L) 12/03/2021 0804  ? CL 99 12/03/2021 0804  ? CO2 28 12/03/2021 0804  ? GLUCOSE 137 (H) 12/03/2021 0804  ? BUN 6 12/03/2021 0804  ? CREATININE 0.67 12/03/2021 0804  ? CALCIUM 8.6 (L) 12/03/2021 0804  ? PROT 6.6 12/01/2021 1146  ? ALBUMIN 2.2 (L) 12/01/2021 1146  ? AST 17 12/01/2021 1146  ? ALT 17 12/01/2021 1146  ? ALKPHOS 106 12/01/2021 1146  ? BILITOT 0.5 12/01/2021 1146  ? GFRNONAA >60  12/03/2021 0804  ? ?Lipase  ?   ?Component Value Date/Time  ? LIPASE 26 12/01/2021 1146  ? ? ? ? ? ?Studies/Results: ?CT ABDOMEN PELVIS W CONTRAST ? ?Result Date: 12/01/2021 ?CLINICAL DATA:  Peritonitis or perforation suspected. EXAM: CT ABDOMEN AND PELVIS WITH CONTRAST TECHNIQUE: Multidetector CT imaging of the abdomen and pelvis was performed using the standard protocol following bolus administration of intravenous contrast. RADIATION DOSE REDUCTION: This exam was performed according to the departmental dose-optimization program which includes automated exposure control, adjustment of the mA and/or kV according to patient size and/or use of iterative reconstruction technique. CONTRAST:  122m OMNIPAQUE IOHEXOL 350 MG/ML SOLN COMPARISON:  November 20, 2021 FINDINGS: Lower chest: Patchy areas of airspace consolidation versus atelectasis in the lower lobes. Small right pleural effusion. Tiny left pleural effusion. Hepatobiliary: Increased in size of perihepatic subdiaphragmatic complex fluid collection containing foci of free gas, which now measures 8.6 x 5.1 cm (prior measurement of 7.3 x 3.0 cm). Pigtail catheter inferior to the liver hilum is in stable position. No residual fluid collection at the site. Normal attenuation of the liver. Normal gallbladder. Pancreas: Unremarkable. No pancreatic ductal dilatation or surrounding inflammatory changes. Spleen: Interval placement of a second pigtail drainage catheter within perisplenic collection which has decreased in size and contains  multiple foci of gas. The collection measures 9.9 by 7.3 cm (prior measurement of 12.8 x 10.9 cm). The spleen demonstrates normal attenuation. Adrenals/Urinary Tract: Adrenal glands are unremarkable. Kidneys are normal, without renal calculi, focal lesion, or hydronephrosis. Bladder is unremarkable. Stomach/Bowel: Stomach is within normal limits. No evidence of bowel wall thickening, distention, or inflammatory changes. 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 Vascular/Lymphatic: No significant vascular findings are present. No enlarged abdominal or pelvic lymph nodes. Reproductive: Status post hysterectomy. No adnexal masses. Other: Persistent thin linear enhancing fluid collection along the undersurface of the upper abdominal wall with maximum thickness of 1 cm. Diminished residual inter lobe fluid collection in the right lower quadrant currently measures 1.8 x 1.1 cm. Diminished subcutaneous collections of gas. Residual foci of gas within the surgical scarring of the mid anterior abdominal wall, with persistent soft tissue attenuation, likely representing small infected collections along surgical scarring. Musculoskeletal: No acute or significant osseous findings. IMPRESSION: 1. Small increase in the size of perihepatic subdiaphragmatic complex fluid collection containing foci of free gas, which now measures 8.6 x 5.1 cm (prior measurement of 7.3 x 3.0 cm). 2. Interval placement of a pigtail drainage catheter within the perisplenic collection which has decreased in size and contains multiple foci of gas. 3. Resolution of the perihepatic fluid collection at the site of the pre-existing pigtail catheter. 4. Diminished residual enhancing inter loop fluid collection in the right lower quadrant currently measuring 1.8 x 1.1 cm. 5. Persistent thin linear enhancing fluid collection along the undersurface of the upper abdominal wall with maximum thickness of 1 cm. 6. Residual foci of gas within the surgical scarring of the mid anterior abdominal wall, with persistent soft tissue attenuation, likely representing small infected collections along the scarring. 7. Diminished subcutaneous collections of gas. 8. Patchy areas of airspace consolidation versus atelectasis in the lower lobes. Small right pleural effusion. Tiny left pleural effusion. Electronically Signed    By: Fidela Salisbury M.D.   On: 12/01/2021 14:13  ? ?IR Sinus/Fist Tube Chk-Non GI ? ?Result Date: 12/02/2021 ?CLINICAL DATA:  History of hysterectomy complicated bowel injury, requiring placement a right mid abdominal drainage catheter on 11/11/2021 as well as a left upper quadrant percutaneous catheter on 11/23/2021. Patient was discharged from the hospital however returned 2 days later with CT scan of the abdomen and pelvis performed 12/01/2021 demonstrating resolution of the collection previously surrounding the right upper abdominal drainage catheter. 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 Patient presents today for drainage catheter injection of existing right-sided drainage catheter, drainage catheter injection, exchange, up sizing and repositioning of the left percutaneous drainage catheter as well as image guided attempted placement of a supra hepatic drainage catheter. EXAM: IR CATHETER TUBE CHANGE; ABSCESS DRAINAGE; SINUS TRACT INJECTION/FISTULOGRAM COMPARISON:  CT abdomen pelvis-12/01/2021; CT-guided left upper quadrant percutaneous catheter placement-11/23/2021; right mid abdominal percutaneous catheter placement-11/11/2021 CONTRAST:  A total of 20 cc Omnipaque 300 was administered into all of percutaneous drainage catheters. MEDICATIONS: None. ANESTHESIA/SEDATION: None FLUOROSCOPY TIME:  54 seconds (30 mGy) TECHNIQUE: Patient was positioned supine on the fluoroscopy table. Initially, drainage catheter injection was performed of the pre-existing right mid abdominal drainage catheter. Measured removed and decision was made to remove this drainage catheter. The external portion of the drainage catheter was cut and drainage catheter was removed intact. Next, sonographic evaluation was performed of  the right upper abdominal quadrant demonstrating a complex supra hepatic fluid  collection compatible with the known abscess demonstrated on abdominal CT performed 12/01/2021. As such, the right upper abdominal quadrant was prepped and draped in usual sterile fashion. After the overlying s

## 2021-12-04 MED ORDER — SENNOSIDES-DOCUSATE SODIUM 8.6-50 MG PO TABS
1.0000 | ORAL_TABLET | Freq: Two times a day (BID) | ORAL | Status: DC
  Administered 2021-12-04 – 2021-12-05 (×3): 1 via ORAL
  Filled 2021-12-04 (×3): qty 1

## 2021-12-04 MED ORDER — HYDROMORPHONE HCL 1 MG/ML IJ SOLN
0.5000 mg | INTRAMUSCULAR | Status: DC | PRN
  Administered 2021-12-05 (×2): 1 mg via INTRAVENOUS
  Filled 2021-12-04 (×2): qty 1

## 2021-12-04 NOTE — TOC Progression Note (Addendum)
Transition of Care (TOC) - Progression Note  ? ? ?Patient Details  ?Name: Michele [name] ?MRN: [id number] ?Date of Birth: Aug 18, 1965 ? ?Transition of Care (TOC) CM/SW Contact  ?Marilu Favre, RN ?Phone Number: ?12/04/2021, 11:31 AM ? ?Clinical Narrative:    ? ?Per note anticipated discharge tomorrow.  ? ?Hoyle Sauer with [facility] aware .  ? ?[facility] spoke with Jackelyn Poling  ? ?Updated clinicals and orders emailed to [employer] and [place] at [facility]  ? ?Expected Discharge Plan: [place] ?Barriers to Discharge: Continued Medical Work up ? ?Expected Discharge Plan and Services ?Expected Discharge Plan: [place] ?  ?Discharge Planning Services: CM Consult ?Post Acute Care Choice: Home Health ?Living arrangements for the past 2 months: [place] ?                ?DME Arranged: N/A ?  ?  ?  ?  ?HH Arranged: RN ?  ?  ?  ?  ? ? ?Social Determinants of Health (SDOH) Interventions ?  ? ?Readmission Risk Interventions ?   ? View : No data to display.  ?  ?  ?  ? ? ?

## 2021-12-04 NOTE — Progress Notes (Signed)
[facility] ?Progress Note ? ?   ?Subjective: ?CC:  ?Pain slowly improving, using less IV dilaudid. Appetite increasing. Still no ostomy output x 36-48h but is having some gas. Denies nausea or vomiting. ? ?Objective: ?Vital signs in last 24 hours: ?Temp:  [97.4 ?F (36.3 ?C)-98.2 ?F (36.8 ?C)] 97.4 ?F (36.3 ?C) (04/26 0757) ?Pulse Rate:  [80-96] 86 (04/26 0757) ?Resp:  [16-18] 16 (04/26 0757) ?BP: (100-115)/(63-87) 100/87 (04/26 0757) ?SpO2:  [94 %-100 %] 94 % (04/26 0757) ?Last BM Date : 12/02/21 ? ?Intake/Output from previous day: ?04/25 0701 - 04/26 0700 ?In: 1250.2 [P.O.:220; I.V.:880.2; IV Piggyback:150] ?Out: 60 [Drains:60] ?Intake/Output this shift: ?No intake/output data recorded. ? ?PE: ?Gen:  Alert, NAD, pleasant cooperative ?Pulm:  Normal effort ?Abd: Soft, mild appropriate tenderness without peritonitis ? RUQ JP - purulent ? LUQ JP - purulent  ? Midline wound with retentions in place - stable exam ? LLQ colostomy - small amt brown stool at os, no significant gas or stool in pouch ?Skin: warm and dry, no rashes  ?Psych: A&Ox3  ? ?Lab Results:  ?Recent Labs  ?  12/02/21 ?0947 12/03/21 ?0804  ?WBC 8.2 6.2  ?HGB 7.2* 7.8*  ?HCT 24.1* 26.3*  ?PLT 557* 537*  ? ?BMET ?Recent Labs  ?  12/02/21 ?0707 12/03/21 ?0804  ?NA 138 135  ?K 3.8 3.4*  ?CL 105 99  ?CO2 26 28  ?GLUCOSE 128* 137*  ?BUN 5* 6  ?CREATININE 0.60 0.67  ?CALCIUM 8.2* 8.6*  ? ?PT/INR ?Recent Labs  ?  12/02/21 ?0707  ?LABPROT 12.9  ?INR 1.0  ? ?CMP  ?   ?Component Value Date/Time  ? NA 135 12/03/2021 0804  ? K 3.4 (L) 12/03/2021 0804  ? CL 99 12/03/2021 0804  ? CO2 28 12/03/2021 0804  ? GLUCOSE 137 (H) 12/03/2021 0804  ? BUN 6 12/03/2021 0804  ? CREATININE 0.67 12/03/2021 0804  ? CALCIUM 8.6 (L) 12/03/2021 0804  ? PROT 6.6 12/01/2021 1146  ? ALBUMIN 2.2 (L) 12/01/2021 1146  ? AST 17 12/01/2021 1146  ? ALT 17 12/01/2021 1146  ? ALKPHOS 106 12/01/2021 1146  ? BILITOT 0.5 12/01/2021 1146  ? GFRNONAA >60 12/03/2021 0804  ? ?Lipase  ?    ?Component Value Date/Time  ? LIPASE 26 12/01/2021 1146  ? ? ? ? ? ?Studies/Results: ?IR Sinus/Fist Tube Chk-Non GI ? ?Result Date: 12/02/2021 ?CLINICAL DATA:  History of hysterectomy complicated bowel injury, requiring placement a right mid abdominal drainage catheter on 11/11/2021 as well as a left upper quadrant percutaneous catheter on 11/23/2021. Patient was discharged from the hospital however returned 2 days later with CT scan of the abdomen and pelvis performed 12/01/2021 demonstrating resolution of the collection previously surrounding the right upper abdominal drainage catheter. 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 Patient presents today for drainage catheter injection of existing right-sided drainage catheter, drainage catheter injection, exchange, up sizing and repositioning of the left percutaneous drainage catheter as well as image guided attempted placement of a supra hepatic drainage catheter. EXAM: IR CATHETER TUBE CHANGE; ABSCESS DRAINAGE; SINUS TRACT INJECTION/FISTULOGRAM COMPARISON:  CT abdomen pelvis-12/01/2021; CT-guided left upper quadrant percutaneous catheter placement-11/23/2021; right mid abdominal percutaneous catheter placement-11/11/2021 CONTRAST:  A total of 20 cc Omnipaque 300 was administered into all of percutaneous drainage catheters. MEDICATIONS: None. ANESTHESIA/SEDATION: None FLUOROSCOPY TIME:  54 seconds (30 mGy) TECHNIQUE: Patient was positioned supine on the fluoroscopy table. Initially,  drainage catheter injection was performed of the pre-existing right mid abdominal drainage catheter. Measured removed and decision was made to remove this drainage catheter. The external portion of the drainage catheter was cut and drainage catheter was removed intact. Next, sonographic evaluation was performed of the right upper abdominal quadrant demonstrating a  complex supra hepatic fluid collection compatible with the known abscess demonstrated on abdominal CT performed 12/01/2021. As such, the right upper abdominal quadrant was prepped and draped in usual sterile fashion. After the overlying soft tissues were anesthetized 1% lidocaine with epinephrine, the collection was accessed under direct ultrasound guidance with an 18 gauge trocar needle. A short Amplatz wire was coiled within the collection. Multiple ultrasound images were saved procedural documentation purposes. Appropriate position was confirmed on fluoroscopic imaging. Next, under intermittent fluoroscopic guidance, the track was dilated ultimately allowing placement of a 12 French percutaneous catheter with end coiled and locked within the supra hepatic abscess. Injection confirmed appropriate positioning. Next, approximately 90 cc of purulent, foul-smelling fluid was aspirated from the collection. The collection the drainage catheter was flushed with a small amount of saline and connected to a gravity bag. The drainage catheter was secured at the skin entrance surface site within interrupted suture and a Stat Lock device. A dressing was applied. The external portion of the pre-existing left upper abdominal quadrant percutaneous catheter was prepped and draped in usual sterile fashion. Preprocedural spot fluoroscopic image was obtained. Contrast injection confirmed appropriate positioning the drainage catheter. The external portion of the drainage catheter was cut and was cannulated with a short Amplatz wire. Next, under intermittent fluoroscopic guidance, the existing 10 French drainage catheter was exchanged for a new, slightly larger now 14 French drainage catheter with end more ideally positioned within the remaining left upper abdominal quadrant abscess. Postprocedural spot fluoroscopic image was obtained. Next, approximately 75 cc of purulent, foul-smelling fluid was aspirated from the left upper  abdominal drainage catheter. The drainage catheter was flushed with a small amount of saline and connected to a gravity bag. The drainage catheter was secured at the skin entrance site within interrupted suture and a Stat Lock device. A dressing was applied. The patient tolerated the above procedures well without immediate postprocedural complication. FINDINGS: Preprocedural spot fluoroscopic image demonstrates stable positioning of the right upper abdominal quadrant percutaneous catheter. Drainage catheter injection demonstrates opacification of decompressed abscess cavity with reflux of contrast along the catheter tract to the skin entrance surface site. Drainage catheter was then removed intact. Sonographic evaluation demonstrates a mixed echogenic collection superior to the liver compatible with the findings seen on preceding abdominal CT. After ultrasound and fluoroscopic guidance, the 12 French drainage catheter is appropriately positioned within the superior hepatic abscess. Approximately 90 cc of purulent, foul-smelling fluid was aspirated from the supra hepatic abscess following drainage catheter placement. Preprocedural spot fluoroscopic image demonstrates stable position of left upper abdominal quadrant percutaneous catheter. Drainage catheter injection demonstrates opacification of the known residual abscess within left upper abdominal quadrant. After fluoroscopic guided exchange, the new, now 53 French drainage catheter is more ideally positioned within the central aspect of the remaining abscess of the left upper quadrant. Approximately 75 cc of purulent fluid was aspirated from the left upper abdominal quadrant following drainage catheter exchange, repositioning and up sizing. IMPRESSION: 1. Injection of pre-existing right mid abdominal drainage catheter is negative for enteric fistula. This drainage catheter was removed at the patient's bedside without incident. 2. Successful ultrasound and  fluoroscopic guided placement of 12 French supra hepatic  abscess drainage catheter yielding 90 cc of purulent fluid. 3. Successful fluoroscopic guided exchange, up sizing and repositioning of now 14 French left upper abdominal

## 2021-12-04 NOTE — Progress Notes (Signed)
? ? ?Referring Physician(s): ?Romana Juniper ? ?Supervising Physician: Ruthann Cancer ? ?Patient Status:  [facility] - In-pt ? ?Chief Complaint: ? ?F/U Drains ? ?Brief History: ? 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 ? ?Her recovery was complicated by intra-abdominal fluid collection development,  ?  ?She is S/p RUQ drain placement by Dr. Serafina Royals on 11/11/21 and LUQ drain placement by Dr. Annamaria Boots on 11/23/21 ?  ?She was discharged on 4/21 but returned to ED on 4/24. ? ?CT showed development of a new subdiaphragmatic fluid collection, ongoing perisplenic fluid collection, resolved subhepatic fluid collection ?  ?She is S/p removal of old RUQ drain and new RUQ drain placement, upsizing and repositioning of the LUQ drain by Dr. Pascal Lux on 4/24. ? ?Subjective: ? ?Lying in bed.  No new complaints.  ? ?Allergies: ?Lipitor [atorvastatin] and Sulfa antibiotics ? ?Medications: ?Prior to Admission medications   ?Medication Sig Start Date End Date Taking? Authorizing Provider  ?acetaminophen (TYLENOL) 500 MG tablet Take 2 tablets (1,000 mg total) by mouth every 6 (six) hours as needed for up to 14 days for mild pain or moderate pain. Do NOT take with combination oxycodone- acetaminophen (percocet) but can take with oxycodone by itself 11/29/21 12/13/21 Yes Winferd Humphrey, PA-C  ?ADDERALL XR 30 MG 24 hr capsule Take 30 mg by mouth See admin instructions. Takes as needed when working 09/23/21  Yes [provider]  ?ALPRAZolam Duanne Moron) 0.5 MG tablet Take 0.5 mg by mouth daily as needed. 11/29/21  Yes [provider]  ?citalopram (CELEXA) 20 MG tablet Take 20 mg by mouth daily.   Yes [provider]  ?CRESTOR 5 MG tablet TAKE 1 TABLET BY MOUTH EVERY DAY ?Patient taking differently: Take 5 mg by mouth daily. 04/17/15  Yes Dorothy Spark, MD  ?methocarbamol (ROBAXIN) 500 MG tablet Take 2 tablets (1,000 mg total) by mouth every 6 (six) hours as needed for up to 7 days  for muscle spasms. 11/29/21 12/06/21 Yes Winferd Humphrey, PA-C  ?Oxycodone HCl 10 MG TABS Take 0.5-1 tablets (5-10 mg total) by mouth every 6 (six) hours as needed for up to 5 days for moderate pain or severe pain (5 mg for moderate pain, '10mg'$  for severe pain). 11/29/21 12/04/21 Yes Winferd Humphrey, PA-C  ?ibuprofen (ADVIL) 600 MG tablet Take 1 tablet (600 mg total) by mouth every 6 (six) hours as needed for cramping or moderate pain. ?Patient not taking: Reported on 12/01/2021 10/28/21   Sanjuana Kava, MD  ?lidocaine (LIDODERM) 5 % Place 1 patch onto the skin daily for 14 days. Remove & Discard patch within 12 hours or as directed by MD ?Patient not taking: Reported on 12/01/2021 11/30/21 12/14/21  Winferd Humphrey, PA-C  ? ? ? ?Vital Signs: ?BP 100/87 (BP Location: Left Arm)   Pulse 86   Temp (!) 97.4 ?F (36.3 ?C) (Oral)   Resp 16   Ht 5' (1.524 m)   LMP 10/07/2021 (Approximate)   SpO2 94%   BMI 42.58 kg/m?  ? ?Physical Exam ?Vitals reviewed.  ?Constitutional:   ?   Appearance: Normal appearance.  ?Cardiovascular:  ?   Rate and Rhythm: Normal rate.  ?Pulmonary:  ?   Effort: Pulmonary effort is normal. No respiratory distress.  ?Abdominal:  ?   Palpations: Abdomen is soft.  ?Neurological:  ?   General: No focal deficit present.  ?   Mental Status: She is alert and oriented to person, place, and time.  ?  Psychiatric:     ?   Mood and Affect: Mood normal.     ?   Behavior: Behavior normal.     ?   Thought Content: Thought content normal.     ?   Judgment: Judgment normal.  ? ?24 hour output:  ?Output by Drain (mL) 12/02/21 0701 - 12/02/21 1900 12/02/21 1901 - 12/03/21 0700 12/03/21 0701 - 12/03/21 1900 12/03/21 1901 - 12/04/21 0700 12/04/21 0701 - 12/04/21 1404  ?Closed System Drain 2 Lateral RUQ 12 Fr. 135 30  30   ?Closed System Drain 1 LUQ 14 Fr. 75 10  30   ? ?Drain Location: RUQ ?Size: Fr size: 12 Fr ?Date of placement: 12/02/21  ?Currently to: Drain collection device: gravity ? Current  examination: ?Flushes/aspirates easily.  ?Insertion site unremarkable. ?Suture and stat lock in place. ?Dressed appropriately.  ? ?Drain Location: left flank  ?Size: Fr size: 14 Fr ?Date of placement: originally placed on 4/15, upsized on 12/02/21  ?Currently to: Drain collection device: Gravity bag  ?Flushes/aspirates easily.  ?Insertion site unremarkable. ?Suture and stat lock in place. ?Dressed appropriately.  ? ?Imaging: ?CT ABDOMEN PELVIS W CONTRAST ? ?Result Date: 12/01/2021 ?CLINICAL DATA:  Peritonitis or perforation suspected. EXAM: CT ABDOMEN AND PELVIS WITH CONTRAST TECHNIQUE: Multidetector CT imaging of the abdomen and pelvis was performed using the standard protocol following bolus administration of intravenous contrast. RADIATION DOSE REDUCTION: This exam was performed according to the departmental dose-optimization program which includes automated exposure control, adjustment of the mA and/or kV according to patient size and/or use of iterative reconstruction technique. CONTRAST:  151m OMNIPAQUE IOHEXOL 350 MG/ML SOLN COMPARISON:  November 20, 2021 FINDINGS: Lower chest: Patchy areas of airspace consolidation versus atelectasis in the lower lobes. Small right pleural effusion. Tiny left pleural effusion. Hepatobiliary: Increased in size of perihepatic subdiaphragmatic complex fluid collection containing foci of free gas, which now measures 8.6 x 5.1 cm (prior measurement of 7.3 x 3.0 cm). Pigtail catheter inferior to the liver hilum is in stable position. No residual fluid collection at the site. Normal attenuation of the liver. Normal gallbladder. Pancreas: Unremarkable. No pancreatic ductal dilatation or surrounding inflammatory changes. Spleen: Interval placement of a second pigtail drainage catheter within perisplenic collection which has decreased in size and contains multiple foci of gas. The collection measures 9.9 by 7.3 cm (prior measurement of 12.8 x 10.9 cm). The spleen demonstrates normal  attenuation. Adrenals/Urinary Tract: Adrenal glands are unremarkable. Kidneys are normal, without renal calculi, focal lesion, or hydronephrosis. Bladder is unremarkable. Stomach/Bowel: Stomach is within normal limits. No evidence of bowel wall thickening, distention, or inflammatory changes. Stable postsurgical changes from partial left colectomy and descending colostomy. 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 Vascular/Lymphatic: No significant vascular findings are present. No enlarged abdominal or pelvic lymph nodes. Reproductive: Status post hysterectomy. No adnexal masses. Other: Persistent thin linear enhancing fluid collection along the undersurface of the upper abdominal wall with maximum thickness of 1 cm. Diminished residual inter lobe fluid collection in the right lower quadrant currently measures 1.8 x 1.1 cm. Diminished subcutaneous collections of gas. Residual foci of gas within the surgical scarring of the mid anterior abdominal wall, with persistent soft tissue attenuation, likely representing small infected collections along surgical scarring. Musculoskeletal: No acute or significant osseous findings. IMPRESSION: 1. Small increase in the size of perihepatic subdiaphragmatic complex fluid collection containing foci of free gas, which now measures 8.6 x 5.1 cm (prior measurement of 7.3 x 3.0 cm). 2. Interval placement  of a pigtail drainage catheter within the perisplenic collection which has decreased in size and contains multiple foci of gas. 3. Resolution of the perihepatic fluid collection at the site of the pre-existing pigtail catheter. 4. Diminished residual enhancing inter loop fluid collection in the right lower quadrant currently measuring 1.8 x 1.1 cm. 5. Persistent thin linear enhancing fluid collection along the undersurface of the upper abdominal wall with maximum thickness of 1 cm. 6. Residual foci of gas within the surgical scarring of the mid anterior abdominal wall, with  persistent soft tissue attenuation, likely representing small infected collections along the scarring. 7. Diminished subcutaneous collections of gas. 8. Patchy areas of airspace consolidation versus atelectasis in the lower lobes. Small ri

## 2021-12-04 NOTE — Progress Notes (Signed)
I did not see her during this part of her hospitalization ?

## 2021-12-05 ENCOUNTER — Other Ambulatory Visit (HOSPITAL_COMMUNITY): Payer: Self-pay

## 2021-12-05 ENCOUNTER — Other Ambulatory Visit: Payer: Self-pay | Admitting: Student

## 2021-12-05 LAB — BASIC METABOLIC PANEL
Anion gap: 7 (ref 5–15)
BUN: 6 mg/dL (ref 6–20)
CO2: 26 mmol/L (ref 22–32)
Calcium: 8.4 mg/dL — ABNORMAL LOW (ref 8.9–10.3)
Chloride: 104 mmol/L (ref 98–111)
Creatinine, Ser: 0.67 mg/dL (ref 0.44–1.00)
GFR, Estimated: >60 mL/min (ref >60)
Glucose, Bld: 95 mg/dL (ref 70–99)
Potassium: 4.1 mmol/L (ref 3.5–5.1)
Sodium: 137 mmol/L (ref 135–145)

## 2021-12-05 LAB — CBC
HCT: 24.5 % — ABNORMAL LOW (ref 36.0–46.0)
Hemoglobin: 7.1 g/dL — ABNORMAL LOW (ref 12.0–15.0)
MCH: 23.6 pg — ABNORMAL LOW (ref 26.0–34.0)
MCHC: 29 g/dL — ABNORMAL LOW (ref 30.0–36.0)
MCV: 81.4 fL (ref 80.0–100.0)
Platelets: 564 10*3/uL — ABNORMAL HIGH (ref 150–400)
RBC: 3.01 MIL/uL — ABNORMAL LOW (ref 3.87–5.11)
RDW: 16.2 % — ABNORMAL HIGH (ref 11.5–15.5)
WBC: 7 10*3/uL (ref 4.0–10.5)
nRBC: 0 % (ref 0.0–0.2)

## 2021-12-05 LAB — GLUCOSE, CAPILLARY
Glucose-Capillary: 166 mg/dL — ABNORMAL HIGH (ref 70–99)
Glucose-Capillary: 107 mg/dL — ABNORMAL HIGH (ref 70–99)

## 2021-12-05 MED ORDER — SODIUM CHLORIDE 0.9% FLUSH: 5.0000 mL | Freq: Every day | INTRAVENOUS | 1 refills | Status: DC

## 2021-12-05 MED ORDER — AMOXICILLIN-POT CLAVULANATE 875-125 MG PO TABS: 1.0000 | ORAL_TABLET | Freq: Two times a day (BID) | ORAL | 0 refills | Status: AC

## 2021-12-05 MED ORDER — LIDOCAINE 5 % EX PTCH: 1.0000 | MEDICATED_PATCH | CUTANEOUS | 0 refills | Status: DC

## 2021-12-05 MED ORDER — NORMAL SALINE FLUSH 0.9 % IV SOLN
5.0000 mL | Freq: Every day | INTRAVENOUS | 0 refills | Status: DC
  Filled 2021-12-05: qty 200, 20d supply, fill #0

## 2021-12-05 MED ORDER — FERROUS SULFATE 325 (65 FE) MG PO TABS: 325.0000 mg | ORAL_TABLET | Freq: Every day | ORAL | 0 refills | Status: DC

## 2021-12-05 MED ORDER — METHOCARBAMOL 1000 MG PO TABS: 1000.0000 mg | ORAL_TABLET | Freq: Four times a day (QID) | ORAL | 1 refills | Status: DC

## 2021-12-05 MED ORDER — SENNOSIDES-DOCUSATE SODIUM 8.6-50 MG PO TABS: 1.0000 | ORAL_TABLET | Freq: Two times a day (BID) | ORAL | 0 refills | Status: DC

## 2021-12-05 MED ORDER — ASCORBIC ACID 500 MG PO TABS: 500.0000 mg | ORAL_TABLET | Freq: Two times a day (BID) | ORAL | 0 refills | Status: DC

## 2021-12-05 MED ORDER — OXYCODONE HCL 10 MG PO TABS: 10.0000 mg | ORAL_TABLET | Freq: Four times a day (QID) | ORAL | 0 refills | Status: AC | PRN

## 2021-12-05 MED ORDER — POLYETHYLENE GLYCOL 3350 17 G PO PACK: 17.0000 g | PACK | Freq: Two times a day (BID) | ORAL | 0 refills | Status: AC | PRN

## 2021-12-05 MED ORDER — GABAPENTIN 300 MG PO CAPS: 300.0000 mg | ORAL_CAPSULE | Freq: Three times a day (TID) | ORAL | 0 refills | Status: DC

## 2021-12-05 NOTE — TOC Progression Note (Signed)
Transition of Care (TOC) - Progression Note  ? ? ?Patient Details  ?Name: Michele [name] ?MRN: [id number] ?Date of Birth: 10/04/1965 ? ?Transition of Care (TOC) CM/SW Contact  ?Marilu Favre, RN ?Phone Number: ?12/05/2021, 10:28 AM ? ?Clinical Narrative:    ? ?Plan for discharge today. Updated wound care is daily and as needed. Patient aware and still wants [place] private nurse to come twice a day . NCM notified Jasmine at [facility] with updated orders and confirmed today is discharge date and patient's wishes. Patient and [place] will discuss schedule directly.   ? ?Hoyle Sauer with [facility] aware and has updated orders.  ? ?Will send discharge summary when available  ? ?Expected Discharge Plan: [place] ?Barriers to Discharge: Continued Medical Work up ? ?Expected Discharge Plan and Services ?Expected Discharge Plan: [place] ?  ?Discharge Planning Services: CM Consult ?Post Acute Care Choice: Home Health ?Living arrangements for the past 2 months: [place] ?                ?DME Arranged: N/A ?  ?  ?  ?  ?HH Arranged: RN ?  ?  ?  ?  ? ? ?Social Determinants of Health (SDOH) Interventions ?  ? ?Readmission Risk Interventions ?   ? View : No data to display.  ?  ?  ?  ? ? ?

## 2021-12-05 NOTE — Plan of Care (Signed)
?  Problem: Health Behavior/Discharge Planning: ?Goal: Ability to manage health-related needs will improve ?12/05/2021 0424 by Derrill Kay, RN ?Outcome: Progressing ?12/05/2021 0423 by Derrill Kay, RN ?Outcome: Progressing ?  ?Problem: Clinical Measurements: ?Goal: Ability to maintain clinical measurements within normal limits will improve ?12/05/2021 0424 by Derrill Kay, RN ?Outcome: Progressing ?12/05/2021 0423 by Derrill Kay, RN ?Outcome: Progressing ?  ?

## 2021-12-05 NOTE — Discharge Instructions (Signed)
IR Drain Care Instruction  ?   ?Always wash your hands before manipulating drain ?Flush the drain with 5 cc normal saline daily - Please pick up saline flushes at [facility] outpatient pharmacy  ?Record output daily (subtract 5 cc that was used to flush the drain from the total daily output)  ?Dressing change as needed and at least once a week ?You will receive a call from [facility]. If you have any questions about your follow up appointment, please call [phone] ?If you have concerns about your drain, please call [facility] at [phone] ? ?

## 2021-12-05 NOTE — Progress Notes (Signed)
? ? ?Referring Physician(s): Dr. Romana Juniper ? ?Supervising Physician: Markus Daft ? ?Patient Status:  [facility] - In-pt ? ?Chief Complaint: ? ?S/p ex lap colectomy and colostomy creation on 1/02, recovery complicated by intra-abdominal fluid collection development  ? ?- S/p RUQ drain placement by Dr. Serafina Royals on 11/11/21 and LUQ drain placement by Dr. Annamaria Boots on 11/23/21 ? ?-Patient was discharged on 4/21, returned to ED on 4/24 CT showed development of a new subdiaphragmatic fluid collection, ongoing perisplenic fluid collection, resolved subhepatic fluid collection ? ?- S/p removal of old RUQ drain, new RUQ drain placement, upsizing and repositioning of the LUQ drain by Dr. Pascal Lux on 4/24.  ? ?Subjective: ? ?Patient laying in bed, NAD.  ?Reports she is doing ok, still little sore from the drain sites.  ?States that she is going home today.  ? ?Allergies: ?Lipitor [atorvastatin] and Sulfa antibiotics ? ?Medications: ?Prior to Admission medications   ?Medication Sig Start Date End Date Taking? Authorizing Provider  ?ADDERALL XR 30 MG 24 hr capsule Take 30 mg by mouth daily. 09/23/21  Yes [provider]  ?citalopram (CELEXA) 20 MG tablet Take 20 mg by mouth daily.   Yes [provider]  ?CRESTOR 5 MG tablet TAKE 1 TABLET BY MOUTH EVERY DAY ?Patient taking differently: Take 5 mg by mouth daily. 04/17/15  Yes Dorothy Spark, MD  ?ibuprofen (ADVIL) 600 MG tablet Take 1 tablet (600 mg total) by mouth every 6 (six) hours as needed for cramping or moderate pain. 10/28/21  Yes Sanjuana Kava, MD  ?oxyCODONE-acetaminophen (PERCOCET/ROXICET) 5-325 MG tablet Take 1-2 tablets by mouth every 4 (four) hours as needed for severe pain or moderate pain. 10/28/21  Yes Sanjuana Kava, MD  ?simethicone (MYLICON) 725 MG chewable tablet Chew 125 mg by mouth every 6 (six) hours as needed for flatulence.   Yes [provider]  ? ? ? ?Vital Signs: ?BP 129/76 (BP Location: Left Arm)   Pulse 81   Temp 97.8 ?F (36.6 ?C)   Resp 20    Ht 5' (1.524 m)   LMP 10/07/2021 (Approximate)   SpO2 96%   BMI 42.58 kg/m?  ? ?Physical Exam ?Vitals reviewed.  ?Constitutional:   ?   General: She is not in acute distress. ?   Appearance: She is well-developed. She is not ill-appearing.  ?HENT:  ?   Head: Normocephalic and atraumatic.  ?Pulmonary:  ?   Effort: Pulmonary effort is normal.  ?Abdominal:  ?   Palpations: Abdomen is soft.  ?Skin: ?   General: Skin is warm and dry.  ?   Coloration: Skin is not cyanotic or pale.  ?   Comments: Positive RUQ drain to a gravity bag. Dressing is clean, dry, and intact. 20 ml of  purulent colored fluid noted in the bag. Drain aspirates and flushes well.  ? ?Positive left flank drain to a gravity bag.  Dressing is clean, dry, and intact. 5 ml of  purulent fluid noted in the bag. Drain aspirates and flushes well.  ?  ?Neurological:  ?   Mental Status: She is alert and oriented to person, place, and time.  ?Psychiatric:     ?   Mood and Affect: Mood normal.     ?   Behavior: Behavior normal.  ? ? ?Imaging: ?CT ABDOMEN PELVIS W CONTRAST ? ?Result Date: 12/01/2021 ?CLINICAL DATA:  Peritonitis or perforation suspected. EXAM: CT ABDOMEN AND PELVIS WITH CONTRAST TECHNIQUE: Multidetector CT imaging of the abdomen and pelvis was performed using  the standard protocol following bolus administration of intravenous contrast. RADIATION DOSE REDUCTION: This exam was performed according to the departmental dose-optimization program which includes automated exposure control, adjustment of the mA and/or kV according to patient size and/or use of iterative reconstruction technique. CONTRAST:  122m OMNIPAQUE IOHEXOL 350 MG/ML SOLN COMPARISON:  November 20, 2021 FINDINGS: Lower chest: Patchy areas of airspace consolidation versus atelectasis in the lower lobes. Small right pleural effusion. Tiny left pleural effusion. Hepatobiliary: Increased in size of perihepatic subdiaphragmatic complex fluid collection containing foci of free gas, which  now measures 8.6 x 5.1 cm (prior measurement of 7.3 x 3.0 cm). Pigtail catheter inferior to the liver hilum is in stable position. No residual fluid collection at the site. Normal attenuation of the liver. Normal gallbladder. Pancreas: Unremarkable. No pancreatic ductal dilatation or surrounding inflammatory changes. Spleen: Interval placement of a second pigtail drainage catheter within perisplenic collection which has decreased in size and contains multiple foci of gas. The collection measures 9.9 by 7.3 cm (prior measurement of 12.8 x 10.9 cm). The spleen demonstrates normal attenuation. Adrenals/Urinary Tract: Adrenal glands are unremarkable. Kidneys are normal, without renal calculi, focal lesion, or hydronephrosis. Bladder is unremarkable. Stomach/Bowel: Stomach is within normal limits. No evidence of bowel wall thickening, distention, or inflammatory changes. Stable postsurgical changes from partial left colectomy and descending colostomy. 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 Vascular/Lymphatic: No significant vascular findings are present. No enlarged abdominal or pelvic lymph nodes. Reproductive: Status post hysterectomy. No adnexal masses. Other: Persistent thin linear enhancing fluid collection along the undersurface of the upper abdominal wall with maximum thickness of 1 cm. Diminished residual inter lobe fluid collection in the right lower quadrant currently measures 1.8 x 1.1 cm. Diminished subcutaneous collections of gas. Residual foci of gas within the surgical scarring of the mid anterior abdominal wall, with persistent soft tissue attenuation, likely representing small infected collections along surgical scarring. Musculoskeletal: No acute or significant osseous findings. IMPRESSION: 1. Small increase in the size of perihepatic subdiaphragmatic complex fluid collection containing foci of free gas, which now measures 8.6 x 5.1 cm (prior measurement of 7.3 x 3.0 cm). 2. Interval  placement of a pigtail drainage catheter within the perisplenic collection which has decreased in size and contains multiple foci of gas. 3. Resolution of the perihepatic fluid collection at the site of the pre-existing pigtail catheter. 4. Diminished residual enhancing inter loop fluid collection in the right lower quadrant currently measuring 1.8 x 1.1 cm. 5. Persistent thin linear enhancing fluid collection along the undersurface of the upper abdominal wall with maximum thickness of 1 cm. 6. Residual foci of gas within the surgical scarring of the mid anterior abdominal wall, with persistent soft tissue attenuation, likely representing small infected collections along the scarring. 7. Diminished subcutaneous collections of gas. 8. Patchy areas of airspace consolidation versus atelectasis in the lower lobes. Small right pleural effusion. Tiny left pleural effusion. Electronically Signed   By: DFidela SalisburyM.D.   On: 12/01/2021 14:13  ? ?IR Sinus/Fist Tube Chk-Non GI ? ?Result Date: 12/02/2021 ?CLINICAL DATA:  History of hysterectomy complicated bowel injury, requiring placement a right mid abdominal drainage catheter on 11/11/2021 as well as a left upper quadrant percutaneous catheter on 11/23/2021. Patient was discharged from the hospital however returned 2 days later with CT scan of the abdomen and pelvis performed 12/01/2021 demonstrating resolution of the collection previously surrounding the right upper abdominal drainage catheter. Increased organization of right supra hepatic abscess with stable positioning  of left upper abdominal quadrant drainage catheter with residual surrounding abscess. Patient presents today for drainage catheter injection of existing right-sided drainage catheter, drainage catheter injection, exchange, up sizing and repositioning of the left percutaneous drainage catheter as well as image guided attempted placement of a supra hepatic drainage catheter. EXAM: IR CATHETER TUBE  CHANGE; ABSCESS DRAINAGE; SINUS TRACT INJECTION/FISTULOGRAM COMPARISON:  CT abdomen pelvis-12/01/2021; CT-guided left upper quadrant percutaneous catheter placement-11/23/2021; right mid abdominal percutaneous cathet

## 2021-12-06 ENCOUNTER — Other Ambulatory Visit (HOSPITAL_COMMUNITY): Payer: Self-pay

## 2021-12-06 ENCOUNTER — Other Ambulatory Visit: Payer: Self-pay | Admitting: Surgery

## 2021-12-06 DIAGNOSIS — K631 Perforation of intestine (nontraumatic): Secondary | ICD-10-CM

## 2021-12-06 DIAGNOSIS — K651 Peritoneal abscess: Secondary | ICD-10-CM

## 2021-12-07 DIAGNOSIS — Z87891 Personal history of nicotine dependence: Secondary | ICD-10-CM | POA: Diagnosis not present

## 2021-12-07 DIAGNOSIS — K5790 Diverticulosis of intestine, part unspecified, without perforation or abscess without bleeding: Secondary | ICD-10-CM | POA: Diagnosis not present

## 2021-12-07 DIAGNOSIS — F419 Anxiety disorder, unspecified: Secondary | ICD-10-CM | POA: Diagnosis not present

## 2021-12-07 DIAGNOSIS — Z48815 Encounter for surgical aftercare following surgery on the digestive system: Secondary | ICD-10-CM | POA: Diagnosis not present

## 2021-12-07 DIAGNOSIS — D649 Anemia, unspecified: Secondary | ICD-10-CM | POA: Diagnosis not present

## 2021-12-07 DIAGNOSIS — N179 Acute kidney failure, unspecified: Secondary | ICD-10-CM | POA: Diagnosis not present

## 2021-12-07 DIAGNOSIS — Z79891 Long term (current) use of opiate analgesic: Secondary | ICD-10-CM | POA: Diagnosis not present

## 2021-12-07 DIAGNOSIS — E785 Hyperlipidemia, unspecified: Secondary | ICD-10-CM | POA: Diagnosis not present

## 2021-12-07 DIAGNOSIS — Z433 Encounter for attention to colostomy: Secondary | ICD-10-CM | POA: Diagnosis not present

## 2021-12-07 DIAGNOSIS — I10 Essential (primary) hypertension: Secondary | ICD-10-CM | POA: Diagnosis not present

## 2021-12-07 DIAGNOSIS — K631 Perforation of intestine (nontraumatic): Secondary | ICD-10-CM | POA: Diagnosis not present

## 2021-12-10 DIAGNOSIS — S31114D Laceration without foreign body of abdominal wall, left lower quadrant without penetration into peritoneal cavity, subsequent encounter: Secondary | ICD-10-CM | POA: Diagnosis not present

## 2021-12-10 DIAGNOSIS — Z933 Colostomy status: Secondary | ICD-10-CM | POA: Diagnosis not present

## 2021-12-10 DIAGNOSIS — C188 Malignant neoplasm of overlapping sites of colon: Secondary | ICD-10-CM | POA: Diagnosis not present

## 2021-12-10 NOTE — Discharge Summary (Addendum)
[facility] ?Discharge Summary  ? ?Patient ID: ?Michele [name] ?MRN: [id number] ?DOB/AGE: 08-24-1965 56 y.o. ? ?Admit date: 12/01/2021 ?Discharge date: 12/05/21 ? ?Admitting Diagnosis: ?Intra-abdominal abscess ? ?Discharge Diagnosis ?Patient Active Problem List  ? Diagnosis Date Noted  ? Intra-abdominal abscess ([place]) 12/01/2021  ? Bowel perforation ([place]) 11/05/2021  ? Abnormal uterine and vaginal bleeding, unspecified 10/25/2021  ? 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 Atypical chest pain 06/29/2013  ? Hyperlipidemia 06/15/2013  ? Chest pain 06/15/2013  ? ? ?Consultants ?Interventional radiology ?Imaging: ?No results found. ? ?Procedures ?Interventional radiology: S/p removal of old RUQ drain, new RUQ drain placement, upsizing and repositioning of the LUQ drain by Dr. Pascal Lux on 4/24.  ? ?Hospital Course:  ? 56 year old woman recently discharged from the surgery service 11/29/21 after undergoing exploratory laparotomy for perforated sigmoid colon , just 10 days after a hysterectomy. postoperative course complicated by fascial dehiscence requiring return to the OR and intra-abdominal abscesses requiring drain placement. Returned to the ED with increased drainage and reported low-grade fever at home. CT of the abdomen and pelvis showed increase in perihepatic fluid collection. The patient was started on IV antibiotics and admitted for further management. IR was consulted and she underwent the above procedure.  Tolerated procedure well and was transferred to the floor.  Diet was advanced as tolerated. On 12/05/21 the patient was afebrile, pain controlled, intra-abdominal abscesses controlled with drains, mobilizing, and felt stable for discharge home. She was discharged on oral antibiotics. Her home healthcare was resumed. She was receiving moist to dry dressing changes to her midline wound, which still has retention sutures in place.  Patient will follow up in our office as below and knows to  call with questions or concerns.  ? ?Physical Exam: ?Gen:  Alert, NAD, pleasant cooperative ?Pulm:  Normal effort ?Abd: Soft, mild appropriate tenderness without peritonitis ?            RUQ JP - purulent ?            LUQ JP - purulent  ?            Midline wound with retentions in place - stable exam ?            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 ?Skin: warm and dry, no rashes  ?Psych: A&Ox3  ? ?Allergies as of 12/05/2021   ? ?   Reactions  ? Lipitor [atorvastatin]   ? Muscle aches on Lipitor 20 mg and 40 mg   ? Sulfa Antibiotics Nausea And Vomiting  ? ?  ? ?  ?Medication List  ?  ? ?TAKE these medications   ? ?acetaminophen 500 MG tablet ?Commonly known as: TYLENOL ?Take 2 tablets (1,000 mg total) by mouth every 6 (six) hours as needed for up to 14 days for mild pain or moderate pain. Do NOT take with combination oxycodone- acetaminophen (percocet) but can take with oxycodone by itself ?  ?Adderall XR 30 MG 24 hr capsule ?Generic drug: amphetamine-dextroamphetamine ?Take 30 mg by mouth See admin instructions. Takes as needed when working ?  ?ALPRAZolam 0.5 MG tablet ?Commonly known as: Duanne Moron ?Take 0.5 mg by mouth daily as needed. ?  ?amoxicillin-clavulanate 875-125 MG tablet ?Commonly known as: Augmentin ?Take 1 tablet by mouth 2 (two) times daily for 14 days. ?  ?ascorbic acid 500 MG tablet ?Commonly known as: VITAMIN C ?Take 1 tablet (500 mg total) by mouth 2 (two) times daily. ?  ?  citalopram 20 MG tablet ?Commonly known as: CELEXA ?Take 20 mg by mouth daily. ?  ?Crestor 5 MG tablet ?Generic drug: rosuvastatin ?TAKE 1 TABLET BY MOUTH EVERY DAY ?What changed: how much to take ?  ?ferrous sulfate 325 (65 FE) MG tablet ?Take 1 tablet (325 mg total) by mouth daily with breakfast. ?  ?gabapentin 300 MG capsule ?Commonly known as: NEURONTIN ?Take 1 capsule (300 mg total) by mouth 3 (three) times daily. ?  ?ibuprofen 600 MG tablet ?Commonly known as: ADVIL ?Take 1 tablet (600 mg total) by mouth every 6  (six) hours as needed for cramping or moderate pain. ?  ?lidocaine 5 % ?Commonly known as: LIDODERM ?Place 1 patch onto the skin daily. Remove & Discard patch within 12 hours or as directed by MD ?  ?Methocarbamol 1000 MG Tabs ?Take 1,000 mg by mouth 4 (four) times daily. ?What changed:  ?medication strength ?when to take this ?reasons to take this ?  ?Oxycodone HCl 10 MG Tabs ?Take 1 tablet (10 mg total) by mouth every 6 (six) hours as needed for up to 7 days for moderate pain. ?What changed:  ?how much to take ?reasons to take this ?  ?polyethylene glycol 17 g packet ?Commonly known as: MIRALAX / GLYCOLAX ?Take 17 g by mouth 2 (two) times daily as needed. ?  ?senna-docusate 8.6-50 MG tablet ?Commonly known as: Senokot-S ?Take 1 tablet by mouth 2 (two) times daily. ?  ?sodium chloride flush 0.9 % Soln ?Commonly known as: NS ?5 mLs by Intracatheter route daily. ?  ? ?  ? ? ? ? Follow-up Information   ? ? Georganna Skeans, MD. Daphane Shepherd on 12/16/2021.   ?Specialty: General Surgery ?Why: at 10:40am for post-operative follow up. please arrive 20-30 minutes early. ?Contact information: ?[address] ?[address] ?[place] [address] ?[phone] ? ? ?  ?  ? ? Sandi Mariscal, MD Follow up.   ?Specialties: Interventional Radiology, Radiology ?Why: Follow up visit at [facility] 10-14 days after discharge. Our schedulers will call you to set up the appointment. ?Contact information: ?[address] ?[address] ?[place] [address] ?[phone] ? ? ?  ?  ? ?  ?  ? ?  ? ? ?Signed: ?Obie Dredge, PA-C ?[facility] ?12/10/2021, 2:06 PM ? ? ? ?Coralie Keens, MD ?

## 2021-12-12 DIAGNOSIS — Z48815 Encounter for surgical aftercare following surgery on the digestive system: Secondary | ICD-10-CM | POA: Diagnosis not present

## 2021-12-12 DIAGNOSIS — F419 Anxiety disorder, unspecified: Secondary | ICD-10-CM | POA: Diagnosis not present

## 2021-12-12 DIAGNOSIS — D649 Anemia, unspecified: Secondary | ICD-10-CM | POA: Diagnosis not present

## 2021-12-12 DIAGNOSIS — K5790 Diverticulosis of intestine, part unspecified, without perforation or abscess without bleeding: Secondary | ICD-10-CM | POA: Diagnosis not present

## 2021-12-12 DIAGNOSIS — Z87891 Personal history of nicotine dependence: Secondary | ICD-10-CM | POA: Diagnosis not present

## 2021-12-12 DIAGNOSIS — K631 Perforation of intestine (nontraumatic): Secondary | ICD-10-CM | POA: Diagnosis not present

## 2021-12-12 DIAGNOSIS — I10 Essential (primary) hypertension: Secondary | ICD-10-CM | POA: Diagnosis not present

## 2021-12-12 DIAGNOSIS — Z433 Encounter for attention to colostomy: Secondary | ICD-10-CM | POA: Diagnosis not present

## 2021-12-12 DIAGNOSIS — E785 Hyperlipidemia, unspecified: Secondary | ICD-10-CM | POA: Diagnosis not present

## 2021-12-12 DIAGNOSIS — Z79891 Long term (current) use of opiate analgesic: Secondary | ICD-10-CM | POA: Diagnosis not present

## 2021-12-12 DIAGNOSIS — N179 Acute kidney failure, unspecified: Secondary | ICD-10-CM | POA: Diagnosis not present

## 2021-12-16 DIAGNOSIS — N179 Acute kidney failure, unspecified: Secondary | ICD-10-CM | POA: Diagnosis not present

## 2021-12-16 DIAGNOSIS — E785 Hyperlipidemia, unspecified: Secondary | ICD-10-CM | POA: Diagnosis not present

## 2021-12-16 DIAGNOSIS — Z87891 Personal history of nicotine dependence: Secondary | ICD-10-CM | POA: Diagnosis not present

## 2021-12-16 DIAGNOSIS — K631 Perforation of intestine (nontraumatic): Secondary | ICD-10-CM | POA: Diagnosis not present

## 2021-12-16 DIAGNOSIS — Z48815 Encounter for surgical aftercare following surgery on the digestive system: Secondary | ICD-10-CM | POA: Diagnosis not present

## 2021-12-16 DIAGNOSIS — D649 Anemia, unspecified: Secondary | ICD-10-CM | POA: Diagnosis not present

## 2021-12-16 DIAGNOSIS — K5790 Diverticulosis of intestine, part unspecified, without perforation or abscess without bleeding: Secondary | ICD-10-CM | POA: Diagnosis not present

## 2021-12-16 DIAGNOSIS — Z433 Encounter for attention to colostomy: Secondary | ICD-10-CM | POA: Diagnosis not present

## 2021-12-16 DIAGNOSIS — F419 Anxiety disorder, unspecified: Secondary | ICD-10-CM | POA: Diagnosis not present

## 2021-12-16 DIAGNOSIS — Z79891 Long term (current) use of opiate analgesic: Secondary | ICD-10-CM | POA: Diagnosis not present

## 2021-12-16 DIAGNOSIS — I10 Essential (primary) hypertension: Secondary | ICD-10-CM | POA: Diagnosis not present

## 2021-12-17 ENCOUNTER — Ambulatory Visit
Admission: RE | Admit: 2021-12-17 | Discharge: 2021-12-17 | Disposition: A | Discharge status: Home / Self Care | Payer: BC Managed Care – PPO | Source: Ambulatory Visit | Attending: Surgery | Admitting: Surgery

## 2021-12-17 ENCOUNTER — Ambulatory Visit
Admission: RE | Admit: 2021-12-17 | Discharge: 2021-12-17 | Disposition: A | Discharge status: Home / Self Care | Payer: BC Managed Care – PPO | Source: Ambulatory Visit | Attending: Physician Assistant | Admitting: Physician Assistant

## 2021-12-17 ENCOUNTER — Emergency Department (HOSPITAL_BASED_OUTPATIENT_CLINIC_OR_DEPARTMENT_OTHER)
Admission: EM | Admit: 2021-12-17 | Discharge: 2021-12-17 | Disposition: A | Discharge status: Home / Self Care | Payer: BC Managed Care – PPO | Attending: Emergency Medicine | Admitting: Emergency Medicine

## 2021-12-17 ENCOUNTER — Other Ambulatory Visit: Payer: Self-pay

## 2021-12-17 ENCOUNTER — Emergency Department (HOSPITAL_BASED_OUTPATIENT_CLINIC_OR_DEPARTMENT_OTHER): Payer: BC Managed Care – PPO

## 2021-12-17 DIAGNOSIS — K429 Umbilical hernia without obstruction or gangrene: Secondary | ICD-10-CM | POA: Diagnosis not present

## 2021-12-17 DIAGNOSIS — I10 Essential (primary) hypertension: Secondary | ICD-10-CM | POA: Insufficient documentation

## 2021-12-17 DIAGNOSIS — K651 Peritoneal abscess: Secondary | ICD-10-CM

## 2021-12-17 DIAGNOSIS — K631 Perforation of intestine (nontraumatic): Secondary | ICD-10-CM

## 2021-12-17 DIAGNOSIS — K750 Abscess of liver: Secondary | ICD-10-CM | POA: Diagnosis not present

## 2021-12-17 DIAGNOSIS — M7989 Other specified soft tissue disorders: Secondary | ICD-10-CM | POA: Diagnosis not present

## 2021-12-17 DIAGNOSIS — Z7901 Long term (current) use of anticoagulants: Secondary | ICD-10-CM | POA: Diagnosis not present

## 2021-12-17 DIAGNOSIS — I82812 Embolism and thrombosis of superficial veins of left lower extremities: Secondary | ICD-10-CM | POA: Diagnosis not present

## 2021-12-17 DIAGNOSIS — Z872 Personal history of diseases of the skin and subcutaneous tissue: Secondary | ICD-10-CM | POA: Diagnosis not present

## 2021-12-17 DIAGNOSIS — I82412 Acute embolism and thrombosis of left femoral vein: Secondary | ICD-10-CM | POA: Diagnosis not present

## 2021-12-17 DIAGNOSIS — Z4682 Encounter for fitting and adjustment of non-vascular catheter: Secondary | ICD-10-CM | POA: Diagnosis not present

## 2021-12-17 HISTORY — PX: IR RADIOLOGIST EVAL & MGMT: IMG5224

## 2021-12-17 MED ORDER — IOPAMIDOL (ISOVUE-300) INJECTION 61%
100.0000 mL | Freq: Once | INTRAVENOUS | Status: AC | PRN
  Administered 2021-12-17: 100 mL via INTRAVENOUS

## 2021-12-17 MED ORDER — RIVAROXABAN (XARELTO) EDUCATION KIT FOR DVT/PE PATIENTS: PACK | Freq: Once | Status: AC

## 2021-12-17 MED ORDER — RIVAROXABAN 15 MG PO TABS
15.0000 mg | ORAL_TABLET | Freq: Once | ORAL | Status: AC
  Administered 2021-12-17: 15 mg via ORAL
  Filled 2021-12-17: qty 1

## 2021-12-17 MED ORDER — RIVAROXABAN (XARELTO) VTE STARTER PACK (15 & 20 MG): 15.0000 mg | ORAL_TABLET | Freq: Two times a day (BID) | ORAL | 0 refills | Status: DC

## 2021-12-17 NOTE — ED Notes (Signed)
Pt sent here for DVT study.   ?Pt has no c/o pain, slight left leg swelling.  Pt had recent sx with complications ?

## 2021-12-17 NOTE — ED Provider Notes (Addendum)
?[facility] EM[facility] DEPT ?Provider Note ? ? ?CSN: [id number] ?Arrival date & time: 12/17/21  1913 ? ?  ? ?History ? ?Chief Complaint  ?Patient presents with  ? DVT  ? ? ?Michele [name] is a 56 y.o. female. ? ?Patient is a 56 year old female with a history of hypertension, anemia and then status post hysterectomy within a week's time had a perforated diverticulitis requiring exploratory surgery, colostomy with recurrent wound infection which initially had a wound VAC but then required primary closure due to it not working and multiple JP drains who was hospitalized for prolonged period of time but has overall been improving.  Her wound is improving and healing.  She followed up with general surgery with Dr. Grandville Silos yesterday and with interventional radiology today and had the drains removed.  However she was called at 5 PM this evening telling her that she had a DVT present in her left leg.  She has not had any pain in her left leg or swelling.  She denies any shortness of breath and reports overall she is improving.  No prior history of DVT.  While she was hospitalized she was receiving Lovenox but she has not been on any other anticoagulation.  Last surgery was almost 2 months ago. ? ?The history is provided by the patient and medical records.  ? ?  ? ?Home Medications ?Prior to Admission medications   ?Medication Sig Start Date End Date Taking? Authorizing Provider  ?RIVAROXABAN (XARELTO) VTE STARTER PACK (15 & 20 MG) Take 15 mg by mouth 2 (two) times daily. Follow package directions: Take one '15mg'$  tablet by mouth twice a day. On day 22, switch to one '20mg'$  tablet once a day. Take with food. 12/17/21  Yes Blanchie Dessert, MD  ?ADDERALL XR 30 MG 24 hr capsule Take 30 mg by mouth See admin instructions. Takes as needed when working 09/23/21   [provider]  ?ALPRAZolam Duanne Moron) 0.5 MG tablet Take 0.5 mg by mouth daily as needed. 11/29/21   [provider]  ?amoxicillin-clavulanate  (AUGMENTIN) 875-125 MG tablet Take 1 tablet by mouth 2 (two) times daily for 14 days. 12/05/21 12/19/21  Jill Alexanders, PA-C  ?ascorbic acid (VITAMIN C) 500 MG tablet Take 1 tablet (500 mg total) by mouth 2 (two) times daily. 12/05/21   Jill Alexanders, PA-C  ?citalopram (CELEXA) 20 MG tablet Take 20 mg by mouth daily.    [provider]  ?CRESTOR 5 MG tablet TAKE 1 TABLET BY MOUTH EVERY DAY ?Patient taking differently: Take 5 mg by mouth daily. 04/17/15   Dorothy Spark, MD  ?ferrous sulfate 325 (65 FE) MG tablet Take 1 tablet (325 mg total) by mouth daily with breakfast. 12/06/21 01/05/22  Jill Alexanders, PA-C  ?gabapentin (NEURONTIN) 300 MG capsule Take 1 capsule (300 mg total) by mouth 3 (three) times daily. 12/05/21   Meuth, Brooke A, PA-C  ?ibuprofen (ADVIL) 600 MG tablet Take 1 tablet (600 mg total) by mouth every 6 (six) hours as needed for cramping or moderate pain. ?Patient not taking: Reported on 12/01/2021 10/28/21   Sanjuana Kava, MD  ?lidocaine (LIDODERM) 5 % Place 1 patch onto the skin daily. Remove & Discard patch within 12 hours or as directed by MD 12/06/21   Jill Alexanders, PA-C  ?methocarbamol 1000 MG TABS Take 1,000 mg by mouth 4 (four) times daily. 12/05/21   Jill Alexanders, PA-C  ?polyethylene glycol (MIRALAX / GLYCOLAX) 17 g packet Take 17 g by  mouth 2 (two) times daily as needed. 12/05/21   Jill Alexanders, PA-C  ?senna-docusate (SENOKOT-S) 8.6-50 MG tablet Take 1 tablet by mouth 2 (two) times daily. 12/05/21   Jill Alexanders, PA-C  ?sodium chloride flush (NS) 0.9 % SOLN 5 mLs by Intracatheter route daily. 12/05/21   Jill Alexanders, PA-C  ?   ? ?Allergies    ?Lipitor [atorvastatin] and Sulfa antibiotics   ? ?Review of Systems   ?Review of Systems ? ?Physical Exam ?Updated Vital Signs ?BP (!) 107/58 (BP Location: Right Arm)   Pulse 88   Temp 98.6 ?F (37 ?C)   Resp 18   Ht 5' (1.524 m)   Wt 98.9 kg   LMP 10/07/2021 (Approximate)   SpO2 100%   BMI  42.58 kg/m?  ?Physical Exam ?Vitals and nursing note reviewed.  ?HENT:  ?   Head: Normocephalic.  ?Cardiovascular:  ?   Rate and Rhythm: Normal rate.  ?   Pulses: Normal pulses.  ?Pulmonary:  ?   Effort: Pulmonary effort is normal.  ?Abdominal:  ?   General: Abdomen is flat.  ?   Palpations: Abdomen is soft.  ?   Comments: Willing midline surgical scar.  Ostomy present in the left lower quadrant.  Bandages placed where drains have been removed with no swelling of the bandages  ?Musculoskeletal:  ?   Right lower leg: Edema present.  ?   Left lower leg: Edema present.  ?   Comments: Mild nonpitting edema present in bilateral lower extremities.  No extremity pain with palpation  ?Neurological:  ?   Mental Status: She is alert. Mental status is at baseline.  ?Psychiatric:     ?   Mood and Affect: Mood normal.  ? ? ?ED Results / Procedures / Treatments   ?Labs ?(all labs ordered are listed, but only abnormal results are displayed) ?Labs Reviewed - No data to display ? ?EKG ?None ? ?Radiology ?CT ABDOMEN PELVIS W CONTRAST ? ?Result Date: 12/17/2021 ?CLINICAL DATA:  Multiple abdominal abscesses status post colonic perforation. EXAM: CT ABDOMEN AND PELVIS WITH CONTRAST TECHNIQUE: Multidetector CT imaging of the abdomen and pelvis was performed using the standard protocol following bolus administration of intravenous contrast. RADIATION DOSE REDUCTION: This exam was performed according to the departmental dose-optimization program which includes automated exposure control, adjustment of the mA and/or kV according to patient size and/or use of iterative reconstruction technique. CONTRAST:  154m ISOVUE-300 IOPAMIDOL (ISOVUE-300) INJECTION 61% COMPARISON:  12/01/2021 FINDINGS: Lower chest: Mild bibasilar atelectasis with trace pleural effusions. Hepatobiliary: No focal liver abnormality is seen. No gallstones, gallbladder wall thickening, or biliary dilatation. Pancreas: Unremarkable. No pancreatic ductal dilatation or  surrounding inflammatory changes. Spleen: No significant abnormality. Adrenals/Urinary Tract: Adrenal glands, kidneys, ureters are normal. Evaluation of the bladder limited by under distension. Is grossly unremarkable. Stomach/Bowel: No bowel dilatation to indicate ileus or obstruction. Excluded sigmoid colon and rectum seen distally. Left lower quadrant colostomy is again seen. No significant bowel wall thickening. Vascular/Lymphatic: Incidental note left common femoral and profundus femoris vein DVT. No enlarged abdominal or pelvic lymph nodes. Reproductive: Postsurgical changes of hysterectomy are again seen. Small amount of fluid in air present within the vaginal cuff. Other: Small umbilical hernia containing fat, fluid, and air. Near complete resolution perihepatic and perisplenic abscesses. Mild subcutaneous emphysema in the left lateral abdominal wall has improved since prior exam. Musculoskeletal: No acute or significant osseous findings. IMPRESSION: 1. Near complete resolution of bilateral subdiaphragmatic abscesses. Drains in appropriate position.  2. Incidentally identified left common femoral and profundus femoris acute DVT, new since CT from 12/01/2021. These results were called by telephone at the time of interpretation on 12/17/2021 at 5:03 pm to provider Dr. Georganna Skeans, who verbally acknowledged these results. Electronically Signed   By: Miachel Roux M.D.   On: 12/17/2021 17:12  ? ?US Venous Img Lower  Left (DVT Study) ? ?Result Date: 12/17/2021 ?CLINICAL DATA:  DVT seen on CT today EXAM: LEFT LOWER EXTREMITY VENOUS DOPPLER ULTRASOUND TECHNIQUE: Gray-scale sonography with graded compression, as well as color Doppler and duplex ultrasound were performed to evaluate the lower extremity deep venous systems from the level of the common femoral vein and including the common femoral, femoral, profunda femoral, popliteal and calf veins including the posterior tibial, peroneal and gastrocnemius veins when  visible. The superficial great saphenous vein was also interrogated. Spectral Doppler was utilized to evaluate flow at rest and with distal augmentation maneuvers in the common femoral, femoral and popliteal veins. COMPARIS

## 2021-12-17 NOTE — Discharge Instructions (Addendum)
Information on my medicine - XARELTO? (rivaroxaban) ? ?This medication education was reviewed with me or my healthcare representative as part of my discharge preparation.  The pharmacist that spoke with me during my hospital stay was:  Lavenia Atlas, [place] ? ?[place]? PRESCRIBED FOR YOU? ?Xarelto? was prescribed to treat blood clots that may have been found in the veins of your legs (deep vein thrombosis) or in your lungs (pulmonary embolism) and to reduce the risk of them occurring again. ? ?What do you need to know about Xarelto?? ?The starting dose is one 15 mg tablet taken TWICE daily with food for the FIRST 21 DAYS then on (enter date)  01/08/22  the dose is changed to one 20 mg tablet taken ONCE A DAY with your evening meal. ? ?DO NOT stop taking Xarelto? without talking to the health care provider who prescribed the medication.  Refill your prescription for 20 mg tablets before you run out. ? ?After discharge, you should have regular check-up appointments with your healthcare provider that is prescribing your Xarelto?[name]  In the future your dose may need to be changed if your kidney function changes by a significant amount. ? ?What do you do if you miss a dose? ?If you are taking Xarelto? TWICE DAILY and you miss a dose, take it as soon as you remember. You may take two 15 mg tablets (total 30 mg) at the same time then resume your regularly scheduled 15 mg twice daily the next day. ? ?If you are taking Xarelto? ONCE DAILY and you miss a dose, take it as soon as you remember on the same day then continue your regularly scheduled once daily regimen the next day. Do not take two doses of Xarelto? at the same time.  ? ?Important Safety Information ?Xarelto? is a blood thinner medicine that can cause bleeding. You should call your healthcare provider right away if you experience any of the following: ?Bleeding from an injury or your nose that does not stop. ?Unusual colored urine (red or dark brown) or  unusual colored stools (red or black). ?Unusual bruising for unknown reasons. ?A serious fall or if you hit your head (even if there is no bleeding). ? ?Some medicines may interact with Xarelto? and might increase your risk of bleeding while on Xarelto?[name] To help avoid this, consult your healthcare provider or pharmacist prior to using any new prescription or non-prescription medications, including herbals, vitamins, non-steroidal anti-inflammatory drugs (NSAIDs) and supplements. ? ?This website has more information on Xarelto?: [email or web address]  ?

## 2021-12-17 NOTE — Progress Notes (Addendum)
? ?Referring Physician(s): ?Dr. Georganna Skeans ? ?Chief Complaint: ?The patient is seen in follow up today s/p  Right supra hepatic abscess drain (12 Fr) and LUQ abdominal abscess drain (14 Fr) ? ?History of present illness: ? ?56 y.o. female outpatient. 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 Found to have a post op intra-abdominal fluid collection. On 4.3.23 IR placed a RUQ  abscess drain. Followed by a LUQ perisplenic abscess drain placement on 4.15.23. Ms Zynda was discharged from the hospital on 4.21.23 and returned to the ED at [facility] on 4.24.23 with abdominal pain. Found to have a resolved RUQ abscess and an increased right supra hepatic abscess. On 4.24.23 IR removed the right mid abdominal abscess drain. Placed a 12 Fr supra hepatic abscess drain and upsized and repositioned the left upper abdominal abscess drain to 14 Fr.  Cultures from the LUQ access are abundant streptococcus mitis/oralis and abundant bactericides ovals. Patient was discharged on 4.27.23 on 14 day course of Augmentin 875/125 and instructed to flushed the drain bid with 10 ml?s of normal saline.  Ms Caroll Rancher presents today for follow up CT scan and drain evaluation.  Output has been minimal, averaging about 5 mL/day.  She currently denies fever, headache, chest pain, dyspnea, cough, abdominal pain, nausea, vomiting or bleeding.   ? ?Patient was seen for follow up Dr. Georganna Skeans general surgery on 5.8.23.  ? ?Past Medical History:  ?Diagnosis Date  ? Anemia   ? Anxiety   ? Hypertension   ? with pregnancies  ? ? ?Past Surgical History:  ?Procedure Laterality Date  ? CESAREAN SECTION    ? TWO of them  ? COLOSTOMY Left 11/05/2021  ? Procedure: COLOSTOMY;  Surgeon: Sanjuana Kava, MD;  Location: [place];  Service: Gynecology;  Laterality: Left;  ? CYSTOSCOPY  10/25/2021  ? Procedure: CYSTOSCOPY;  Surgeon: Sanjuana Kava, MD;  Location: [facility]  OR;  Service: Gynecology;;  ? dental implants N/A 2016  ? lower 2 teeth  ? HYSTERECTOMY ABDOMINAL WITH SALPINGECTOMY  10/25/2021  ? Procedure: HYSTERECTOMY ABDOMINAL WITH BILATERAL SALPINGECTOMY;  Surgeon: Sanjuana Kava, MD;  Location: [place];  Service: Gynecology;;  ? HYSTEROSCOPY WITH D & C  01/30/2017  ? Procedure: DILATATION AND CURETTAGE /HYSTEROSCOPY;  Surgeon: Sanjuana Kava, MD;  Location: [facility];  Service: Gynecology;;  ? IR CATHETER TUBE CHANGE  12/02/2021  ? IR GUIDED DRAIN W CATHETER PLACEMENT  12/02/2021  ? IR SINUS/FIST TUBE CHK-NON GI  12/02/2021  ? LAPAROSCOPY  10/25/2021  ? Procedure: LAPAROSCOPY DIAGNOSTIC;  Surgeon: Sanjuana Kava, MD;  Location: [place];  Service: Gynecology;;  ? LAPAROTOMY N/A 11/05/2021  ? Procedure: EXPLORATORY LAPAROTOMY AND COLECTOMY ;  Surgeon: Sanjuana Kava, MD;  Location: [place];  Service: Gynecology;  Laterality: N/A;  ? LAPAROTOMY N/A 11/14/2021  ? Procedure: EXPLORATORY  LAPAROTOMY AND ABDOMINAL WALL CLOSURE;  Surgeon: Erroll Luna, MD;  Location: [place];  Service: General;  Laterality: N/A;  ? ? ?Allergies: ?Lipitor [atorvastatin] and Sulfa antibiotics ? ?Medications: ?Prior to Admission medications   ?Medication Sig Start Date End Date Taking? Authorizing Provider  ?ADDERALL XR 30 MG 24 hr capsule Take 30 mg by mouth See admin instructions. Takes as needed when working 09/23/21   [provider]  ?ALPRAZolam Duanne Moron) 0.5 MG tablet Take 0.5 mg by mouth daily as needed. 11/29/21   [provider]  ?amoxicillin-clavulanate (AUGMENTIN) 875-125 MG tablet Take 1 tablet by  mouth 2 (two) times daily for 14 days. 12/05/21 12/19/21  Jill Alexanders, PA-C  ?ascorbic acid (VITAMIN C) 500 MG tablet Take 1 tablet (500 mg total) by mouth 2 (two) times daily. 12/05/21   Jill Alexanders, PA-C  ?citalopram (CELEXA) 20 MG tablet Take 20 mg by mouth daily.    [provider]  ?CRESTOR 5 MG tablet TAKE 1 TABLET BY MOUTH EVERY DAY ?Patient taking differently: Take 5 mg by mouth  daily. 04/17/15   Dorothy Spark, MD  ?ferrous sulfate 325 (65 FE) MG tablet Take 1 tablet (325 mg total) by mouth daily with breakfast. 12/06/21 01/05/22  Jill Alexanders, PA-C  ?gabapentin (NEURONTIN) 300 MG capsule Take 1 capsule (300 mg total) by mouth 3 (three) times daily. 12/05/21   Meuth, Brooke A, PA-C  ?ibuprofen (ADVIL) 600 MG tablet Take 1 tablet (600 mg total) by mouth every 6 (six) hours as needed for cramping or moderate pain. ?Patient not taking: Reported on 12/01/2021 10/28/21   Sanjuana Kava, MD  ?lidocaine (LIDODERM) 5 % Place 1 patch onto the skin daily. Remove & Discard patch within 12 hours or as directed by MD 12/06/21   Jill Alexanders, PA-C  ?methocarbamol 1000 MG TABS Take 1,000 mg by mouth 4 (four) times daily. 12/05/21   Jill Alexanders, PA-C  ?polyethylene glycol (MIRALAX / GLYCOLAX) 17 g packet Take 17 g by mouth 2 (two) times daily as needed. 12/05/21   Jill Alexanders, PA-C  ?senna-docusate (SENOKOT-S) 8.6-50 MG tablet Take 1 tablet by mouth 2 (two) times daily. 12/05/21   Jill Alexanders, PA-C  ?sodium chloride flush (NS) 0.9 % SOLN 5 mLs by Intracatheter route daily. 12/05/21   Jill Alexanders, PA-C  ?  ? ?Family History  ?Problem Relation Age of Onset  ? Heart attack Father   ? Hypertension Sister   ? ? ?Social History  ? ?Socioeconomic History  ? Marital status: Legally Separated  ?  Spouse name: Not on file  ? Number of children: Not on file  ? Years of education: Not on file  ? Highest education level: Not on file  ?Occupational History  ? Not on file  ?Tobacco Use  ? Smoking status: Former  ?  Packs/day: 0.50  ?  Years: 15.00  ?  Pack years: 7.50  ?  Types: Cigarettes  ?  Quit date: 2003  ?  Years since quitting: 20.3  ? Smokeless tobacco: Never  ? Tobacco comments:  ?  quit 2003  ?Vaping Use  ? Vaping Use: Never used  ?Substance and Sexual Activity  ? Alcohol use: No  ? Drug use: No  ? Sexual activity: Yes  ?  Birth control/protection: Surgical  ?  Comment: 2006   ?Other Topics Concern  ? Not on file  ?Social History Narrative  ? The pt is a homemaker -pt quit smoking cigarettes 2004   ? ?Social Determinants of Health  ? ?Financial Resource Strain: Not on file  ?Food Insecurity: Not on file  ?Transportation Needs: Not on file  ?Physical Activity: Not on file  ?Stress: Not on file  ?Social Connections: Not on file  ? ? ? ?Vital Signs: ?BP 116/69   Pulse 76   Temp 98.6 ?F (37 ?C)   LMP 10/07/2021 (Approximate)   SpO2 99%  ? ?Physical Exam ?Vitals and nursing note reviewed.  ?Constitutional:   ?   Appearance: She is well-developed.  ?HENT:  ?   Head: Normocephalic and  atraumatic.  ?Eyes:  ?   Conjunctiva/sclera: Conjunctivae normal.  ?Pulmonary:  ?   Effort: Pulmonary effort is normal.  ?Abdominal:  ?   Comments: Positive RUQ and LUQ drains to gravity bag. Site are unremarkable with no erythema, edema, tenderness, bleeding or drainage noted at exit sites. Suture and stat lock in place. Dressing is clean dry and intact.  Minimal amount of yellow-clear fluid noted in each of the gravity bag.  Drains are able to be flushed easily. ? ?  ?Musculoskeletal:     ?   General: Normal range of motion.  ?   Cervical back: Normal range of motion.  ?Skin: ?   General: Skin is warm.  ?Neurological:  ?   Mental Status: She is alert and oriented to person, place, and time.  ? ? ?Imaging: ?No results found. ? ?Labs: ? ?CBC: ?Recent Labs  ?  12/01/21 ?1146 12/02/21 ?0707 12/03/21 ?0804 12/05/21 ?9476  ?WBC 8.1 8.2 6.2 7.0  ?HGB 9.1* 7.2* 7.8* 7.1*  ?HCT 31.3* 24.1* 26.3* 24.5*  ?PLT 709* 557* 537* 564*  ? ? ?COAGS: ?Recent Labs  ?  12/02/21 ?0707  ?INR 1.0  ? ? ?BMP: ?Recent Labs  ?  12/01/21 ?1146 12/02/21 ?0707 12/03/21 ?0804 12/05/21 ?5465  ?NA 136 138 135 137  ?K 3.5 3.8 3.4* 4.1  ?CL 101 105 99 104  ?CO2 '23 26 28 26  '$ ?GLUCOSE 152* 128* 137* 95  ?BUN 7 5* 6 6  ?CALCIUM 8.8* 8.2* 8.6* 8.4*  ?CREATININE 0.60 0.60 0.67 0.67  ?GFRNONAA >60 >60 >60 >60  ? ? ?LIVER FUNCTION TESTS: ?Recent Labs   ?  11/05/21 ?1255 12/01/21 ?1146  ?BILITOT 0.7 0.5  ?AST 32 17  ?ALT 22 17  ?ALKPHOS 138* 106  ?PROT 6.0* 6.6  ?ALBUMIN 1.9* 2.2*  ? ? ?Assessment: ? ? ?56 y.o. female outpatient. History of hysterectomy o

## 2021-12-17 NOTE — ED Triage Notes (Addendum)
Patient here from Home. ? ?Hysterectomy was performed late March and Patient had to have Corrective Surgery for Colon Rupture. Patient visited General Surgeon and had drains removed today at the Office. ? 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 Mild Pitting Edema to same Extremity. Pulses Palpable Distally. ? ?NAD Noted during Triage. A&Ox4. GCS 15. BIB Wheelchair. ?

## 2021-12-19 DIAGNOSIS — D649 Anemia, unspecified: Secondary | ICD-10-CM | POA: Diagnosis not present

## 2021-12-19 DIAGNOSIS — N179 Acute kidney failure, unspecified: Secondary | ICD-10-CM | POA: Diagnosis not present

## 2021-12-19 DIAGNOSIS — Z79891 Long term (current) use of opiate analgesic: Secondary | ICD-10-CM | POA: Diagnosis not present

## 2021-12-19 DIAGNOSIS — Z87891 Personal history of nicotine dependence: Secondary | ICD-10-CM | POA: Diagnosis not present

## 2021-12-19 DIAGNOSIS — Z433 Encounter for attention to colostomy: Secondary | ICD-10-CM | POA: Diagnosis not present

## 2021-12-19 DIAGNOSIS — I10 Essential (primary) hypertension: Secondary | ICD-10-CM | POA: Diagnosis not present

## 2021-12-19 DIAGNOSIS — F419 Anxiety disorder, unspecified: Secondary | ICD-10-CM | POA: Diagnosis not present

## 2021-12-19 DIAGNOSIS — Z48815 Encounter for surgical aftercare following surgery on the digestive system: Secondary | ICD-10-CM | POA: Diagnosis not present

## 2021-12-19 DIAGNOSIS — K5790 Diverticulosis of intestine, part unspecified, without perforation or abscess without bleeding: Secondary | ICD-10-CM | POA: Diagnosis not present

## 2021-12-19 DIAGNOSIS — E785 Hyperlipidemia, unspecified: Secondary | ICD-10-CM | POA: Diagnosis not present

## 2021-12-19 DIAGNOSIS — K631 Perforation of intestine (nontraumatic): Secondary | ICD-10-CM | POA: Diagnosis not present

## 2021-12-23 ENCOUNTER — Emergency Department (HOSPITAL_BASED_OUTPATIENT_CLINIC_OR_DEPARTMENT_OTHER)
Admission: EM | Admit: 2021-12-23 | Discharge: 2021-12-24 | Disposition: A | Discharge status: Home / Self Care | Payer: BC Managed Care – PPO | Attending: Emergency Medicine | Admitting: Emergency Medicine

## 2021-12-23 ENCOUNTER — Encounter (HOSPITAL_BASED_OUTPATIENT_CLINIC_OR_DEPARTMENT_OTHER): Payer: Self-pay

## 2021-12-23 ENCOUNTER — Other Ambulatory Visit: Payer: Self-pay

## 2021-12-23 DIAGNOSIS — F419 Anxiety disorder, unspecified: Secondary | ICD-10-CM | POA: Diagnosis not present

## 2021-12-23 DIAGNOSIS — N3000 Acute cystitis without hematuria: Secondary | ICD-10-CM | POA: Diagnosis not present

## 2021-12-23 DIAGNOSIS — E785 Hyperlipidemia, unspecified: Secondary | ICD-10-CM | POA: Diagnosis not present

## 2021-12-23 DIAGNOSIS — N179 Acute kidney failure, unspecified: Secondary | ICD-10-CM | POA: Diagnosis not present

## 2021-12-23 DIAGNOSIS — R300 Dysuria: Secondary | ICD-10-CM | POA: Diagnosis not present

## 2021-12-23 DIAGNOSIS — D649 Anemia, unspecified: Secondary | ICD-10-CM | POA: Diagnosis not present

## 2021-12-23 DIAGNOSIS — K5790 Diverticulosis of intestine, part unspecified, without perforation or abscess without bleeding: Secondary | ICD-10-CM | POA: Diagnosis not present

## 2021-12-23 DIAGNOSIS — Z87891 Personal history of nicotine dependence: Secondary | ICD-10-CM | POA: Diagnosis not present

## 2021-12-23 DIAGNOSIS — K631 Perforation of intestine (nontraumatic): Secondary | ICD-10-CM | POA: Diagnosis not present

## 2021-12-23 DIAGNOSIS — Z79891 Long term (current) use of opiate analgesic: Secondary | ICD-10-CM | POA: Diagnosis not present

## 2021-12-23 DIAGNOSIS — I10 Essential (primary) hypertension: Secondary | ICD-10-CM | POA: Diagnosis not present

## 2021-12-23 DIAGNOSIS — Z433 Encounter for attention to colostomy: Secondary | ICD-10-CM | POA: Diagnosis not present

## 2021-12-23 DIAGNOSIS — Z48815 Encounter for surgical aftercare following surgery on the digestive system: Secondary | ICD-10-CM | POA: Diagnosis not present

## 2021-12-23 LAB — URINALYSIS, ROUTINE W REFLEX MICROSCOPIC
Bilirubin Urine: NEGATIVE
Glucose, UA: NEGATIVE mg/dL
Hgb urine dipstick: NEGATIVE
Ketones, ur: NEGATIVE mg/dL
Nitrite: NEGATIVE
Protein, ur: 30 mg/dL — AB
Specific Gravity, Urine: 1.042 — ABNORMAL HIGH (ref 1.005–1.030)
WBC, UA: >50 WBC/hpf — ABNORMAL HIGH (ref 0–5)
pH: 5.5 (ref 5.0–8.0)

## 2021-12-23 NOTE — ED Triage Notes (Signed)
Arrives POV from home.  ? ?C/o urinary frequency and dysuria since yesterday.  ? ?Recently had JP drains removed last week that were placed after a clon rupture procedure.  ?

## 2021-12-24 MED ORDER — OXYCODONE HCL 5 MG PO TABS
10.0000 mg | ORAL_TABLET | Freq: Once | ORAL | Status: AC
  Administered 2021-12-24: 10 mg via ORAL
  Filled 2021-12-24: qty 2

## 2021-12-24 MED ORDER — CEPHALEXIN 250 MG PO CAPS
250.0000 mg | ORAL_CAPSULE | Freq: Once | ORAL | Status: AC
  Administered 2021-12-24: 250 mg via ORAL
  Filled 2021-12-24: qty 1

## 2021-12-24 MED ORDER — CEPHALEXIN 500 MG PO CAPS
500.0000 mg | ORAL_CAPSULE | Freq: Two times a day (BID) | ORAL | 0 refills | Status: AC
Start: 2021-12-24 — End: 2021-12-31

## 2021-12-24 NOTE — ED Provider Notes (Signed)
? ?[facility]  ?Provider Note ? ?CSN: [id number] ?Arrival date & time: 12/23/21 2116 ? ?History ?Chief Complaint  ?Patient presents with  ? Dysuria  ? ? ?Michele [name] is a 56 y.o. female has had a recent complicated post op course after a hysterectomy which was complicated by colon perforation leading to a partial colectomy/colostomy, then wound dehiscence and intraabdominal abscess x 2. She recently had her drains removed and finished her oral Abx (Augmentin) and has been doing well. She was noted to have a nonocclusive DVT in L leg on follow up imaging and was started on Xarelto about a week ago. In the last 2 days she has begun to have urinary frequency and dysuria. No fever, vomiting or back pain. She has had similar with UTI in the past.  ? ? ?Home Medications ?Prior to Admission medications   ?Medication Sig Start Date End Date Taking? Authorizing Provider  ?cephALEXin (KEFLEX) 500 MG capsule Take 1 capsule (500 mg total) by mouth 2 (two) times daily for 7 days. 12/24/21 12/31/21 Yes Truddie Hidden, MD  ?ADDERALL XR 30 MG 24 hr capsule Take 30 mg by mouth See admin instructions. Takes as needed when working 09/23/21   [provider]  ?ALPRAZolam Duanne Moron) 0.5 MG tablet Take 0.5 mg by mouth daily as needed. 11/29/21   [provider]  ?ascorbic acid (VITAMIN C) 500 MG tablet Take 1 tablet (500 mg total) by mouth 2 (two) times daily. 12/05/21   Jill Alexanders, PA-C  ?citalopram (CELEXA) 20 MG tablet Take 20 mg by mouth daily.    [provider]  ?CRESTOR 5 MG tablet TAKE 1 TABLET BY MOUTH EVERY DAY ?Patient taking differently: Take 5 mg by mouth daily. 04/17/15   Dorothy Spark, MD  ?ferrous sulfate 325 (65 FE) MG tablet Take 1 tablet (325 mg total) by mouth daily with breakfast. 12/06/21 01/05/22  Jill Alexanders, PA-C  ?gabapentin (NEURONTIN) 300 MG capsule Take 1 capsule (300 mg total) by mouth 3 (three) times daily. 12/05/21   Meuth, Brooke  A, PA-C  ?ibuprofen (ADVIL) 600 MG tablet Take 1 tablet (600 mg total) by mouth every 6 (six) hours as needed for cramping or moderate pain. ?Patient not taking: Reported on 12/01/2021 10/28/21   Sanjuana Kava, MD  ?lidocaine (LIDODERM) 5 % Place 1 patch onto the skin daily. Remove & Discard patch within 12 hours or as directed by MD 12/06/21   Jill Alexanders, PA-C  ?methocarbamol 1000 MG TABS Take 1,000 mg by mouth 4 (four) times daily. 12/05/21   Jill Alexanders, PA-C  ?polyethylene glycol (MIRALAX / GLYCOLAX) 17 g packet Take 17 g by mouth 2 (two) times daily as needed. 12/05/21   Jill Alexanders, PA-C  ?RIVAROXABAN (XARELTO) VTE STARTER PACK (15 & 20 MG) Take 15 mg by mouth 2 (two) times daily. Follow package directions: Take one '15mg'$  tablet by mouth twice a day. On day 22, switch to one '20mg'$  tablet once a day. Take with food. 12/17/21   Blanchie Dessert, MD  ?senna-docusate (SENOKOT-S) 8.6-50 MG tablet Take 1 tablet by mouth 2 (two) times daily. 12/05/21   Jill Alexanders, PA-C  ?sodium chloride flush (NS) 0.9 % SOLN 5 mLs by Intracatheter route daily. 12/05/21   Jill Alexanders, PA-C  ? ? ? ?Allergies    ?Atorvastatin and Sulfa antibiotics ? ? ?Review of Systems   ?Review of Systems ?Please see HPI for pertinent positives and negatives ? ?  Physical Exam ?BP 127/81   Pulse 92   Temp 98.2 ?F (36.8 ?C)   Resp 18   Ht 5' (1.524 m)   Wt 74.8 kg   LMP 10/07/2021 (Approximate)   SpO2 99%   BMI 32.22 kg/m?  ? ?Physical Exam ?Vitals and nursing note reviewed.  ?Constitutional:   ?   Appearance: Normal appearance.  ?HENT:  ?   Head: Normocephalic and atraumatic.  ?   Nose: Nose normal.  ?   Mouth/Throat:  ?   Mouth: Mucous membranes are moist.  ?Eyes:  ?   Extraocular Movements: Extraocular movements intact.  ?   Conjunctiva/sclera: Conjunctivae normal.  ?Cardiovascular:  ?   Rate and Rhythm: Normal rate.  ?Pulmonary:  ?   Effort: Pulmonary effort is normal.  ?   Breath sounds: Normal breath sounds.   ?Abdominal:  ?   General: Abdomen is flat.  ?   Palpations: Abdomen is soft.  ?   Tenderness: There is abdominal tenderness (over midline incision, no signs of infection).  ?   Comments: Stoma device in LLQ, no stool in bag, she has been constipated  ?Musculoskeletal:     ?   General: No swelling. Normal range of motion.  ?   Cervical back: Neck supple.  ?Skin: ?   General: Skin is warm and dry.  ?Neurological:  ?   General: No focal deficit present.  ?   Mental Status: She is alert.  ?Psychiatric:     ?   Mood and Affect: Mood normal.  ? ? ?ED Results / Procedures / Treatments   ?EKG ?None ? ?Procedures ?Procedures ? ?Medications Ordered in the ED ?Medications  ?cephALEXin (KEFLEX) capsule 250 mg (has no administration in time range)  ?oxyCODONE (Oxy IR/ROXICODONE) immediate release tablet 10 mg (has no administration in time range)  ? ? ?Initial Impression and Plan ? Patient with recently complicated post-op course now with signs of UTI. She does not have any systemic symptoms at present. Will begin oral Keflex, Rx for same, and send her urine for culture. Advised to return if her pain worsens, if she develops fever, vomiting or back pain.  ? ?ED Course  ? ?  ? ? ?MDM Rules/Calculators/A&P ?Medical Decision Making ?Problems Addressed: ?Acute cystitis without hematuria: acute illness or injury ? ?Amount and/or Complexity of Data Reviewed ?Labs: ordered. Decision-making details documented in ED Course. ? ?Risk ?Prescription drug management. ? ? ? ?Final Clinical Impression(s) / ED Diagnoses ?Final diagnoses:  ?Acute cystitis without hematuria  ? ? ?Rx / DC Orders ?ED Discharge Orders   ? ?      Ordered  ?  cephALEXin (KEFLEX) 500 MG capsule  2 times daily       ? 12/24/21 0018  ? ?  ?  ? ?  ? ?  ?Truddie Hidden, MD ?12/24/21 0018 ? ?

## 2021-12-24 NOTE — ED Notes (Signed)
Pt verbalizes understanding of discharge instructions. Opportunity for questioning and answers were provided. Pt discharged from ED to home with husband.    

## 2021-12-25 LAB — URINE CULTURE: Culture: <10000 — AB

## 2021-12-26 DIAGNOSIS — D649 Anemia, unspecified: Secondary | ICD-10-CM | POA: Diagnosis not present

## 2021-12-26 DIAGNOSIS — Z48815 Encounter for surgical aftercare following surgery on the digestive system: Secondary | ICD-10-CM | POA: Diagnosis not present

## 2021-12-26 DIAGNOSIS — E785 Hyperlipidemia, unspecified: Secondary | ICD-10-CM | POA: Diagnosis not present

## 2021-12-26 DIAGNOSIS — K631 Perforation of intestine (nontraumatic): Secondary | ICD-10-CM | POA: Diagnosis not present

## 2021-12-26 DIAGNOSIS — Z433 Encounter for attention to colostomy: Secondary | ICD-10-CM | POA: Diagnosis not present

## 2021-12-26 DIAGNOSIS — K5790 Diverticulosis of intestine, part unspecified, without perforation or abscess without bleeding: Secondary | ICD-10-CM | POA: Diagnosis not present

## 2021-12-26 DIAGNOSIS — I10 Essential (primary) hypertension: Secondary | ICD-10-CM | POA: Diagnosis not present

## 2021-12-26 DIAGNOSIS — Z79891 Long term (current) use of opiate analgesic: Secondary | ICD-10-CM | POA: Diagnosis not present

## 2021-12-26 DIAGNOSIS — F419 Anxiety disorder, unspecified: Secondary | ICD-10-CM | POA: Diagnosis not present

## 2021-12-26 DIAGNOSIS — Z87891 Personal history of nicotine dependence: Secondary | ICD-10-CM | POA: Diagnosis not present

## 2021-12-26 DIAGNOSIS — N179 Acute kidney failure, unspecified: Secondary | ICD-10-CM | POA: Diagnosis not present

## 2021-12-30 DIAGNOSIS — K631 Perforation of intestine (nontraumatic): Secondary | ICD-10-CM | POA: Diagnosis not present

## 2021-12-30 DIAGNOSIS — Z433 Encounter for attention to colostomy: Secondary | ICD-10-CM | POA: Diagnosis not present

## 2021-12-30 DIAGNOSIS — K5790 Diverticulosis of intestine, part unspecified, without perforation or abscess without bleeding: Secondary | ICD-10-CM | POA: Diagnosis not present

## 2021-12-30 DIAGNOSIS — I10 Essential (primary) hypertension: Secondary | ICD-10-CM | POA: Diagnosis not present

## 2021-12-30 DIAGNOSIS — F419 Anxiety disorder, unspecified: Secondary | ICD-10-CM | POA: Diagnosis not present

## 2021-12-30 DIAGNOSIS — N179 Acute kidney failure, unspecified: Secondary | ICD-10-CM | POA: Diagnosis not present

## 2021-12-30 DIAGNOSIS — Z79891 Long term (current) use of opiate analgesic: Secondary | ICD-10-CM | POA: Diagnosis not present

## 2021-12-30 DIAGNOSIS — D649 Anemia, unspecified: Secondary | ICD-10-CM | POA: Diagnosis not present

## 2021-12-30 DIAGNOSIS — Z48815 Encounter for surgical aftercare following surgery on the digestive system: Secondary | ICD-10-CM | POA: Diagnosis not present

## 2021-12-30 DIAGNOSIS — E785 Hyperlipidemia, unspecified: Secondary | ICD-10-CM | POA: Diagnosis not present

## 2021-12-30 DIAGNOSIS — Z87891 Personal history of nicotine dependence: Secondary | ICD-10-CM | POA: Diagnosis not present

## 2022-01-02 DIAGNOSIS — Z48815 Encounter for surgical aftercare following surgery on the digestive system: Secondary | ICD-10-CM | POA: Diagnosis not present

## 2022-01-02 DIAGNOSIS — K5790 Diverticulosis of intestine, part unspecified, without perforation or abscess without bleeding: Secondary | ICD-10-CM | POA: Diagnosis not present

## 2022-01-02 DIAGNOSIS — I10 Essential (primary) hypertension: Secondary | ICD-10-CM | POA: Diagnosis not present

## 2022-01-02 DIAGNOSIS — Z79891 Long term (current) use of opiate analgesic: Secondary | ICD-10-CM | POA: Diagnosis not present

## 2022-01-02 DIAGNOSIS — Z433 Encounter for attention to colostomy: Secondary | ICD-10-CM | POA: Diagnosis not present

## 2022-01-02 DIAGNOSIS — D649 Anemia, unspecified: Secondary | ICD-10-CM | POA: Diagnosis not present

## 2022-01-02 DIAGNOSIS — K631 Perforation of intestine (nontraumatic): Secondary | ICD-10-CM | POA: Diagnosis not present

## 2022-01-02 DIAGNOSIS — E785 Hyperlipidemia, unspecified: Secondary | ICD-10-CM | POA: Diagnosis not present

## 2022-01-02 DIAGNOSIS — F419 Anxiety disorder, unspecified: Secondary | ICD-10-CM | POA: Diagnosis not present

## 2022-01-02 DIAGNOSIS — N179 Acute kidney failure, unspecified: Secondary | ICD-10-CM | POA: Diagnosis not present

## 2022-01-02 DIAGNOSIS — Z87891 Personal history of nicotine dependence: Secondary | ICD-10-CM | POA: Diagnosis not present

## 2022-01-06 DIAGNOSIS — K631 Perforation of intestine (nontraumatic): Secondary | ICD-10-CM | POA: Diagnosis not present

## 2022-01-06 DIAGNOSIS — N179 Acute kidney failure, unspecified: Secondary | ICD-10-CM | POA: Diagnosis not present

## 2022-01-06 DIAGNOSIS — Z79891 Long term (current) use of opiate analgesic: Secondary | ICD-10-CM | POA: Diagnosis not present

## 2022-01-06 DIAGNOSIS — F419 Anxiety disorder, unspecified: Secondary | ICD-10-CM | POA: Diagnosis not present

## 2022-01-06 DIAGNOSIS — Z87891 Personal history of nicotine dependence: Secondary | ICD-10-CM | POA: Diagnosis not present

## 2022-01-06 DIAGNOSIS — Z48815 Encounter for surgical aftercare following surgery on the digestive system: Secondary | ICD-10-CM | POA: Diagnosis not present

## 2022-01-06 DIAGNOSIS — K5790 Diverticulosis of intestine, part unspecified, without perforation or abscess without bleeding: Secondary | ICD-10-CM | POA: Diagnosis not present

## 2022-01-06 DIAGNOSIS — Z433 Encounter for attention to colostomy: Secondary | ICD-10-CM | POA: Diagnosis not present

## 2022-01-06 DIAGNOSIS — D649 Anemia, unspecified: Secondary | ICD-10-CM | POA: Diagnosis not present

## 2022-01-06 DIAGNOSIS — E785 Hyperlipidemia, unspecified: Secondary | ICD-10-CM | POA: Diagnosis not present

## 2022-01-06 DIAGNOSIS — I10 Essential (primary) hypertension: Secondary | ICD-10-CM | POA: Diagnosis not present

## 2022-01-07 ENCOUNTER — Encounter (HOSPITAL_BASED_OUTPATIENT_CLINIC_OR_DEPARTMENT_OTHER): Payer: Self-pay

## 2022-01-07 ENCOUNTER — Ambulatory Visit (HOSPITAL_BASED_OUTPATIENT_CLINIC_OR_DEPARTMENT_OTHER): Payer: BC Managed Care – PPO | Admitting: Nurse Practitioner

## 2022-01-09 DIAGNOSIS — Z48815 Encounter for surgical aftercare following surgery on the digestive system: Secondary | ICD-10-CM | POA: Diagnosis not present

## 2022-01-09 DIAGNOSIS — K5790 Diverticulosis of intestine, part unspecified, without perforation or abscess without bleeding: Secondary | ICD-10-CM | POA: Diagnosis not present

## 2022-01-09 DIAGNOSIS — N179 Acute kidney failure, unspecified: Secondary | ICD-10-CM | POA: Diagnosis not present

## 2022-01-09 DIAGNOSIS — Z79891 Long term (current) use of opiate analgesic: Secondary | ICD-10-CM | POA: Diagnosis not present

## 2022-01-09 DIAGNOSIS — I10 Essential (primary) hypertension: Secondary | ICD-10-CM | POA: Diagnosis not present

## 2022-01-09 DIAGNOSIS — K631 Perforation of intestine (nontraumatic): Secondary | ICD-10-CM | POA: Diagnosis not present

## 2022-01-09 DIAGNOSIS — F419 Anxiety disorder, unspecified: Secondary | ICD-10-CM | POA: Diagnosis not present

## 2022-01-09 DIAGNOSIS — D649 Anemia, unspecified: Secondary | ICD-10-CM | POA: Diagnosis not present

## 2022-01-09 DIAGNOSIS — Z87891 Personal history of nicotine dependence: Secondary | ICD-10-CM | POA: Diagnosis not present

## 2022-01-09 DIAGNOSIS — E785 Hyperlipidemia, unspecified: Secondary | ICD-10-CM | POA: Diagnosis not present

## 2022-01-09 DIAGNOSIS — Z433 Encounter for attention to colostomy: Secondary | ICD-10-CM | POA: Diagnosis not present

## 2022-01-11 DIAGNOSIS — F0631 Mood disorder due to known physiological condition with depressive features: Secondary | ICD-10-CM | POA: Diagnosis not present

## 2022-01-11 DIAGNOSIS — Z7901 Long term (current) use of anticoagulants: Secondary | ICD-10-CM | POA: Diagnosis not present

## 2022-01-11 DIAGNOSIS — F411 Generalized anxiety disorder: Secondary | ICD-10-CM | POA: Diagnosis not present

## 2022-01-11 DIAGNOSIS — I82412 Acute embolism and thrombosis of left femoral vein: Secondary | ICD-10-CM | POA: Diagnosis not present

## 2022-01-13 DIAGNOSIS — K631 Perforation of intestine (nontraumatic): Secondary | ICD-10-CM | POA: Diagnosis not present

## 2022-01-13 DIAGNOSIS — Z48815 Encounter for surgical aftercare following surgery on the digestive system: Secondary | ICD-10-CM | POA: Diagnosis not present

## 2022-01-13 DIAGNOSIS — K5790 Diverticulosis of intestine, part unspecified, without perforation or abscess without bleeding: Secondary | ICD-10-CM | POA: Diagnosis not present

## 2022-01-13 DIAGNOSIS — D649 Anemia, unspecified: Secondary | ICD-10-CM | POA: Diagnosis not present

## 2022-01-13 DIAGNOSIS — F419 Anxiety disorder, unspecified: Secondary | ICD-10-CM | POA: Diagnosis not present

## 2022-01-13 DIAGNOSIS — Z87891 Personal history of nicotine dependence: Secondary | ICD-10-CM | POA: Diagnosis not present

## 2022-01-13 DIAGNOSIS — Z79891 Long term (current) use of opiate analgesic: Secondary | ICD-10-CM | POA: Diagnosis not present

## 2022-01-13 DIAGNOSIS — N179 Acute kidney failure, unspecified: Secondary | ICD-10-CM | POA: Diagnosis not present

## 2022-01-13 DIAGNOSIS — E785 Hyperlipidemia, unspecified: Secondary | ICD-10-CM | POA: Diagnosis not present

## 2022-01-13 DIAGNOSIS — I10 Essential (primary) hypertension: Secondary | ICD-10-CM | POA: Diagnosis not present

## 2022-01-13 DIAGNOSIS — Z433 Encounter for attention to colostomy: Secondary | ICD-10-CM | POA: Diagnosis not present

## 2022-01-16 DIAGNOSIS — Z87891 Personal history of nicotine dependence: Secondary | ICD-10-CM | POA: Diagnosis not present

## 2022-01-16 DIAGNOSIS — D649 Anemia, unspecified: Secondary | ICD-10-CM | POA: Diagnosis not present

## 2022-01-16 DIAGNOSIS — E785 Hyperlipidemia, unspecified: Secondary | ICD-10-CM | POA: Diagnosis not present

## 2022-01-16 DIAGNOSIS — F419 Anxiety disorder, unspecified: Secondary | ICD-10-CM | POA: Diagnosis not present

## 2022-01-16 DIAGNOSIS — K631 Perforation of intestine (nontraumatic): Secondary | ICD-10-CM | POA: Diagnosis not present

## 2022-01-16 DIAGNOSIS — Z433 Encounter for attention to colostomy: Secondary | ICD-10-CM | POA: Diagnosis not present

## 2022-01-16 DIAGNOSIS — Z79891 Long term (current) use of opiate analgesic: Secondary | ICD-10-CM | POA: Diagnosis not present

## 2022-01-16 DIAGNOSIS — Z48815 Encounter for surgical aftercare following surgery on the digestive system: Secondary | ICD-10-CM | POA: Diagnosis not present

## 2022-01-16 DIAGNOSIS — N179 Acute kidney failure, unspecified: Secondary | ICD-10-CM | POA: Diagnosis not present

## 2022-01-16 DIAGNOSIS — I10 Essential (primary) hypertension: Secondary | ICD-10-CM | POA: Diagnosis not present

## 2022-01-16 DIAGNOSIS — K5790 Diverticulosis of intestine, part unspecified, without perforation or abscess without bleeding: Secondary | ICD-10-CM | POA: Diagnosis not present

## 2022-01-17 ENCOUNTER — Encounter (HOSPITAL_BASED_OUTPATIENT_CLINIC_OR_DEPARTMENT_OTHER): Payer: Self-pay | Admitting: Nurse Practitioner

## 2022-01-17 ENCOUNTER — Other Ambulatory Visit (HOSPITAL_BASED_OUTPATIENT_CLINIC_OR_DEPARTMENT_OTHER): Payer: Self-pay

## 2022-01-17 ENCOUNTER — Ambulatory Visit (INDEPENDENT_AMBULATORY_CARE_PROVIDER_SITE_OTHER): Payer: BC Managed Care – PPO | Admitting: Nurse Practitioner

## 2022-01-17 VITALS — BP 107/74 | HR 81 | Ht 61.0 in | Wt 160.0 lb

## 2022-01-17 DIAGNOSIS — K651 Peritoneal abscess: Secondary | ICD-10-CM | POA: Diagnosis not present

## 2022-01-17 DIAGNOSIS — Z0000 Encounter for general adult medical examination without abnormal findings: Secondary | ICD-10-CM

## 2022-01-17 DIAGNOSIS — F410 Panic disorder [episodic paroxysmal anxiety] without agoraphobia: Secondary | ICD-10-CM

## 2022-01-17 DIAGNOSIS — K631 Perforation of intestine (nontraumatic): Secondary | ICD-10-CM | POA: Diagnosis not present

## 2022-01-17 DIAGNOSIS — Z90710 Acquired absence of both cervix and uterus: Secondary | ICD-10-CM

## 2022-01-17 DIAGNOSIS — I82419 Acute embolism and thrombosis of unspecified femoral vein: Secondary | ICD-10-CM

## 2022-01-17 MED ORDER — HYDROXYZINE HCL 25 MG PO TABS
25.0000 mg | ORAL_TABLET | Freq: Every evening | ORAL | 1 refills | Status: DC | PRN
  Filled 2022-01-17: qty 60, 30d supply, fill #0
  Filled 2022-02-17: qty 60, 30d supply, fill #1

## 2022-01-17 MED ORDER — CLONAZEPAM 0.5 MG PO TABS
0.2500 mg | ORAL_TABLET | Freq: Two times a day (BID) | ORAL | 1 refills | Status: DC | PRN
  Filled 2022-01-17: qty 20, 10d supply, fill #0
  Filled 2022-02-05: qty 20, 10d supply, fill #1

## 2022-01-17 NOTE — Patient Instructions (Signed)
Thank you for choosing [place] [facility] for your Primary Care needs. I am excited for the opportunity to partner with you to meet your health care goals. It was a pleasure meeting you today!  Recommendations from today's visit: I have sent in Hydroxyzine 25 -55m at bedtime as needed to help with sleep I have sent in Klonopin 0.25-0.53mup to 2 times a day for panic symptoms I have given you samples of Vraylar to try for mood Keep taking your Celexa   Information on diet, exercise, and health maintenance recommendations are listed below. This is information to help you be sure you are on track for optimal health and monitoring.   Please look over this and let usKoreanow if you have any questions or if you have completed any of the health maintenance outside of [facility] that we can be sure your records are up to date.  ___________________________________________________________ About Me: I am an Adult-Geriatric Nurse Practitioner with a background in caring for patients for more than 20 years with a strong intensive care background. I provide primary care and sports medicine services to patients age 4213nd older within this office. My education had a strong focus on caring for the older adult population, which I am passionate about. I am also the director of the APP Fellowship with [facility]  My desire is to provide you with the best service through preventive medicine and supportive care. I consider you a part of the medical team and value your input. I work diligently to ensure that you are heard and your needs are met in a safe and effective manner. I want you to feel comfortable with me as your provider and want you to know that your health concerns are important to me.  For your information, our office hours are: Monday, Tuesday, and Thursday 8:00 AM - 5:00 PM Wednesday and Friday 8:00 AM - 12:00 PM.   In my time away from the office I am teaching new  APP's within the system and am unavailable, but my partner, Dr. deBurnard Buntings in the office for emergent needs.   If you have questions or concerns, please call our office at [phone] send usKorea MyChart message and we will respond as quickly as possible.  ____________________________________________________________ MyChart:  For all urgent or time sensitive needs we ask that you please call the office to avoid delays. Our number is [phone]. MyChart is not constantly monitored and due to the large volume of messages a day, replies may take up to 72 business hours.  MyChart Policy: MyChart allows for you to see your visit notes, after visit summary, provider recommendations, lab and tests results, make an appointment, request refills, and contact your provider or the office for non-urgent questions or concerns. Providers are seeing patients during normal business hours and do not have built in time to review MyChart messages.  We ask that you allow a minimum of 3 business days for responses to MyConstellation BrandsFor this reason, please do not send urgent requests through MyBazile MillsPlease call the office at [phone] and ongoing conditions may require a visit. We have virtual and in person visit available for your convenience.  Complex MyChart concerns may require a visit. Your provider may request you schedule a virtual or in person visit to ensure we are providing the best care possible. MyChart messages sent after 11:00 AM on Friday will not be received by the provider until  Monday morning.    Lab and Test Results: You will receive your lab and test results on MyChart as soon as they are completed and results have been sent by the lab or testing facility. Due to this service, you will receive your results BEFORE your provider.  I review lab and tests results each morning prior to seeing patients. Some results require collaboration with other providers to ensure you are receiving the  most appropriate care. For this reason, we ask that you please allow a minimum of 3-5 business days from the time the ALL results have been received for your provider to receive and review lab and test results and contact you about these.  Most lab and test result comments from the provider will be sent through [place]. Your provider may recommend changes to the plan of care, follow-up visits, repeat testing, ask questions, or request an office visit to discuss these results. You may reply directly to this message or call the office at [phone] to provide information for the provider or set up an appointment. In some instances, you will be called with test results and recommendations. Please let us know if this is preferred and we will make note of this in your chart to provide this for you.    If you have not heard a response to your lab or test results in 5 business days from all results returning to [place], please call the office to let us know. We ask that you please avoid calling prior to this time unless there is an emergent concern. Due to high call volumes, this can delay the resulting process.  After Hours: For all non-emergency after hours needs, please call the office at [phone] and select the option to reach the on-call provider service. On-call services are shared between multiple [place] offices and therefore it will not be possible to speak directly with your provider. On-call providers may provide medical advice and recommendations, but are unable to provide refills for maintenance medications.  For all emergency or urgent medical needs after normal business hours, we recommend that you seek care at the closest Urgent Care or Emergency Department to ensure appropriate treatment in a timely manner.  [facility] has a 24 hour emergency room located on the ground floor for your convenience.   Urgent Concerns During the Business Day Providers are seeing  patients from 8AM to [place] with a busy schedule and are most often not able to respond to non-urgent calls until the end of the day or the next business day. If you should have URGENT concerns during the day, please call and speak to the nurse or schedule a same day appointment so that we can address your concern without delay.   Thank you, again, for choosing me as your health care partner. I appreciate your trust and look forward to learning more about you.   Michele Keeler, DNP, AGNP-c ___________________________________________________________  Health Maintenance Recommendations Screening Testing Mammogram Every 1 -2 years based on history and risk factors Starting at age 60 Pap Smear Ages 21-39 every 3 years Ages 23-65 every 5 years with HPV testing More frequent testing may be required based on results and history Colon Cancer Screening Every 1-10 years based on test performed, risk factors, and history Starting at age 21 Bone Density Screening Every 2-10 years based on history Starting at age 51 for women Recommendations for men differ based on medication usage, history, and risk factors AAA Screening One time ultrasound Men  69-67 years old who have every smoked Lung Cancer Screening Low Dose Lung CT every 12 months Age 92-80 years with a 30 pack-year smoking history who still smoke or who have quit within the last 15 years  Screening Labs Routine  Labs: Complete Blood Count (CBC), Complete Metabolic Panel (CMP), Cholesterol (Lipid Panel) Every 6-12 months based on history and medications May be recommended more frequently based on current conditions or previous results Hemoglobin A1c Lab Every 3-12 months based on history and previous results Starting at age 52 or earlier with diagnosis of diabetes, high cholesterol, BMI >26, and/or risk factors Frequent monitoring for patients with diabetes to ensure blood sugar control Thyroid Panel (TSH w/ T3 & T4) Every 6 months  based on history, symptoms, and risk factors May be repeated more often if on medication HIV One time testing for all patients 28 and older May be repeated more frequently for patients with increased risk factors or exposure Hepatitis C One time testing for all patients 25 and older May be repeated more frequently for patients with increased risk factors or exposure Gonorrhea, Chlamydia Every 12 months for all sexually active persons 13-24 years Additional monitoring may be recommended for those who are considered high risk or who have symptoms PSA Men 17-60 years old with risk factors Additional screening may be recommended from age 58-69 based on risk factors, symptoms, and history  Vaccine Recommendations Tetanus Booster All adults every 10 years Flu Vaccine All patients 6 months and older every year COVID Vaccine All patients 12 years and older Initial dosing with booster May recommend additional booster based on age and health history HPV Vaccine 2 doses all patients age 15-26 Dosing may be considered for patients over 26 Shingles Vaccine (Shingrix) 2 doses all adults 37 years and older Pneumonia (Pneumovax 23) All adults 39 years and older May recommend earlier dosing based on health history Pneumonia (Prevnar 74) All adults 64 years and older Dosed 1 year after Pneumovax 23  Additional Screening, Testing, and Vaccinations may be recommended on an individualized basis based on family history, health history, risk factors, and/or exposure.  __________________________________________________________  Diet Recommendations for All Patients  I recommend that all patients maintain a diet low in saturated fats, carbohydrates, and cholesterol. While this can be challenging at first, it is not impossible and small changes can make big differences.  Things to try: Decreasing the amount of soda, sweet tea, and/or juice to one or less per day and replace with water While water is  always the first choice, if you do not like water you may consider adding a water additive without sugar to improve the taste other sugar free drinks Replace potatoes with a brightly colored vegetable at dinner Use healthy oils, such as canola oil or olive oil, instead of butter or hard margarine Limit your bread intake to two pieces or less a day Replace regular pasta with low carb pasta options Bake, broil, or grill foods instead of frying Monitor portion sizes  Eat smaller, more frequent meals throughout the day instead of large meals  An important thing to remember is, if you love foods that are not great for your health, you don't have to give them up completely. Instead, allow these foods to be a reward when you have done well. Allowing yourself to still have special treats every once in a while is a nice way to tell yourself thank you for working hard to keep yourself healthy.   Also remember that every  day is a new day. If you have a bad day and "fall off the wagon", you can still climb right back up and keep moving along on your journey!  We have resources available to help you!  Some websites that may be helpful include: www.http://carter.biz/  Www.VeryWellFit.com _____________________________________________________________  Activity Recommendations for All Patients  I recommend that all adults get at least 20 minutes of moderate physical activity that elevates your heart rate at least 5 days out of the week.  Some examples include: Walking or jogging at a pace that allows you to carry on a conversation Cycling (stationary bike or outdoors) Water aerobics Yoga Weight lifting Dancing If physical limitations prevent you from putting stress on your joints, exercise in a pool or seated in a chair are excellent options.  Do determine your MAXIMUM heart rate for activity: YOUR AGE - 220 = MAX HeartRate   Remember! Do not push yourself too hard.  Start slowly and build up your pace,  speed, weight, time in exercise, etc.  Allow your body to rest between exercise and get good sleep. You will need more water than normal when you are exerting yourself. Do not wait until you are thirsty to drink. Drink with a purpose of getting in at least 8, 8 ounce glasses of water a day plus more depending on how much you exercise and sweat.    If you begin to develop dizziness, chest pain, abdominal pain, jaw pain, shortness of breath, headache, vision changes, lightheadedness, or other concerning symptoms, stop the activity and allow your body to rest. If your symptoms are severe, seek emergency evaluation immediately. If your symptoms are concerning, but not severe, please let us know so that we can recommend further evaluation.

## 2022-01-17 NOTE — Progress Notes (Unsigned)
Orma Render, DNP, AGNP-c Primary Care & Sports Medicine [address]  [place], [place] [address] [phone]  New patient visit   Patient: Michele [name]   DOB: 1965/09/18   56 y.o. Female  MRN: [id number] Visit Date: 01/17/2022  Patient Care Team: Pcp, No as PCP - General  Today's Vitals   01/17/22 0829  BP: 107/74  Pulse: 81  SpO2: 98%  Weight: 160 lb (72.6 kg)  Height: '5\' 1"'$  (1.549 m)   Body mass index is 30.23 kg/m.   Today's healthcare provider: Orma Render, NP   Chief Complaint  Patient presents with   New Patient (Initial Visit)    Patient presents today to establish care. Was taking Xarelto ( bad panic attacks) crying a lot, not eating Hysterectomy then colon rupture ( then septic) open wound   Subjective    Michele [name] is a 56 y.o. female who presents today as a new patient to establish care.    Patient endorses the following concerns presently:  History reviewed and reveals the following: Past Medical History:  Diagnosis Date   Anemia    Anxiety    Hypertension    with pregnancies   Past Surgical History:  Procedure Laterality Date   CESAREAN SECTION     TWO of them   COLOSTOMY Left 11/05/2021   Procedure: COLOSTOMY;  Surgeon: Sanjuana Kava, MD;  Location: [facility];  Service: Gynecology;  Laterality: Left;   CYSTOSCOPY  10/25/2021   Procedure: CYSTOSCOPY;  Surgeon: Sanjuana Kava, MD;  Location: [facility];  Service: Gynecology;;   dental implants N/A 2016   lower 2 teeth   HYSTERECTOMY ABDOMINAL WITH SALPINGECTOMY  10/25/2021   Procedure: HYSTERECTOMY ABDOMINAL WITH BILATERAL SALPINGECTOMY;  Surgeon: Sanjuana Kava, MD;  Location: [facility];  Service: Gynecology;;   HYSTEROSCOPY WITH D & C  01/30/2017   Procedure: DILATATION AND CURETTAGE /HYSTEROSCOPY;  Surgeon: Sanjuana Kava, MD;  Location: [facility];  Service: Gynecology;;   IR CATHETER TUBE CHANGE  12/02/2021   IR GUIDED DRAIN W CATHETER PLACEMENT  12/02/2021   IR  RADIOLOGIST EVAL & MGMT  12/17/2021   IR SINUS/FIST TUBE CHK-NON GI  12/02/2021   LAPAROSCOPY  10/25/2021   Procedure: LAPAROSCOPY DIAGNOSTIC;  Surgeon: Sanjuana Kava, MD;  Location: [facility];  Service: Gynecology;;   LAPAROTOMY N/A 11/05/2021   Procedure: EXPLORATORY LAPAROTOMY AND COLECTOMY ;  Surgeon: Sanjuana Kava, MD;  Location: [facility];  Service: Gynecology;  Laterality: N/A;   LAPAROTOMY N/A 11/14/2021   Procedure: EXPLORATORY  LAPAROTOMY AND ABDOMINAL WALL CLOSURE;  Surgeon: Erroll Luna, MD;  Location: [facility];  Service: General;  Laterality: N/A;   Family Status  Relation Name Status   Mother  Alive       age 12   Father  Deceased at age 68       sudden MI   Sister  Alive       76 and 75   Other children Alive       76 and 5    Family History  Problem Relation Age of Onset   Heart attack Father    Hypertension Sister    Social History   Socioeconomic History   Marital status: Divorced    Spouse name: Not on file   Number of children: Not on file   Years of education: Not on file   Highest education level: Not on file  Occupational History   Not on file  Tobacco  Use   Smoking status: Former    Packs/day: 0.50    Years: 15.00    Total pack years: 7.50    Types: Cigarettes    Quit date: 2003    Years since quitting: 20.4   Smokeless tobacco: Never   Tobacco comments:    quit 2003  Vaping Use   Vaping Use: Never used  Substance and Sexual Activity   Alcohol use: No   Drug use: No   Sexual activity: Yes    Birth control/protection: Surgical    Comment: 2006  Other Topics Concern   Not on file  Social History Narrative   The pt is a homemaker -pt quit smoking cigarettes 2004    Social Determinants of Health   Financial Resource Strain: Not on file  Food Insecurity: Not on file  Transportation Needs: Not on file  Physical Activity: Not on file  Stress: Not on file  Social Connections: Not on file   Outpatient Medications Prior to Visit  Medication Sig    ADDERALL XR 30 MG 24 hr capsule Take 30 mg by mouth See admin instructions. Takes as needed when working   ALPRAZolam Duanne Moron) 0.5 MG tablet Take 0.5 mg by mouth daily as needed.   ascorbic acid (VITAMIN C) 500 MG tablet Take 1 tablet (500 mg total) by mouth 2 (two) times daily.   citalopram (CELEXA) 20 MG tablet Take 20 mg by mouth daily.   CRESTOR 5 MG tablet TAKE 1 TABLET BY MOUTH EVERY DAY (Patient taking differently: Take 5 mg by mouth daily.)   oxyCODONE (OXY IR/ROXICODONE) 5 MG immediate release tablet Take 5 mg by mouth every 6 (six) hours as needed.   Oxycodone HCl 10 MG TABS Take 10 mg by mouth every 6 (six) hours as needed.   polyethylene glycol (MIRALAX / GLYCOLAX) 17 g packet Take 17 g by mouth 2 (two) times daily as needed.   ferrous sulfate 325 (65 FE) MG tablet Take 1 tablet (325 mg total) by mouth daily with breakfast.   methocarbamol (ROBAXIN) 500 MG tablet Take 1,000 mg by mouth 4 (four) times daily. (Patient not taking: Reported on 01/17/2022)   RIVAROXABAN (XARELTO) VTE STARTER PACK (15 & 20 MG) Take 15 mg by mouth 2 (two) times daily. Follow package directions: Take one '15mg'$  tablet by mouth twice a day. On day 22, switch to one '20mg'$  tablet once a day. Take with food. (Patient not taking: Reported on 01/17/2022)   [DISCONTINUED] gabapentin (NEURONTIN) 300 MG capsule Take 1 capsule (300 mg total) by mouth 3 (three) times daily.   [DISCONTINUED] ibuprofen (ADVIL) 600 MG tablet Take 1 tablet (600 mg total) by mouth every 6 (six) hours as needed for cramping or moderate pain. (Patient not taking: Reported on 12/01/2021)   [DISCONTINUED] lidocaine (LIDODERM) 5 % Place 1 patch onto the skin daily. Remove & Discard patch within 12 hours or as directed by MD   [DISCONTINUED] methocarbamol 1000 MG TABS Take 1,000 mg by mouth 4 (four) times daily.   [DISCONTINUED] senna-docusate (SENOKOT-S) 8.6-50 MG tablet Take 1 tablet by mouth 2 (two) times daily.   [DISCONTINUED] sodium chloride flush (NS)  0.9 % SOLN 5 mLs by Intracatheter route daily.   No facility-administered medications prior to visit.   Allergies  Allergen Reactions   Atorvastatin Other (See Comments) and Rash    Muscle aches on Lipitor 20 mg and 40 mg  Muscle aches on Lipitor 20 mg and 40 mg  Muscle aches on Lipitor  20 mg and 40 mg  Muscle aches on Lipitor 20 mg and 40 mg  Muscle aches on Lipitor 20 mg and 40 mg    Sulfa Antibiotics Nausea And Vomiting, Other (See Comments) and Rash   Immunization History  Administered Date(s) Administered   H1N1 07/11/2008   Hepatitis A 07/08/2017   Influenza Split 04/19/2010   Influenza-Unspecified 04/11/2014, 06/27/2016, 07/08/2017, 06/29/2020   PFIZER(Purple Top)SARS-COV-2 Vaccination 11/04/2019, 11/25/2019, 12/10/2019, 08/13/2020   Tdap 06/21/2007   Unspecified SARS-COV-2 Vaccination 09/20/2020    Review of Systems All review of systems negative except what is listed in the HPI   Objective    BP 107/74   Pulse 81   Ht '5\' 1"'$  (1.549 m)   Wt 160 lb (72.6 kg)   LMP 10/07/2021 (Approximate)   SpO2 98%   BMI 30.23 kg/m  Physical Exam  No results found for any visits on 01/17/22.  Assessment & Plan      Problem List Items Addressed This Visit   None    No follow-ups on file.      Josep Luviano, Coralee Pesa, NP, DNP, AGNP-C Primary Care & Sports Medicine at [place]

## 2022-01-20 DIAGNOSIS — Z87891 Personal history of nicotine dependence: Secondary | ICD-10-CM | POA: Diagnosis not present

## 2022-01-20 DIAGNOSIS — K5790 Diverticulosis of intestine, part unspecified, without perforation or abscess without bleeding: Secondary | ICD-10-CM | POA: Diagnosis not present

## 2022-01-20 DIAGNOSIS — F419 Anxiety disorder, unspecified: Secondary | ICD-10-CM | POA: Diagnosis not present

## 2022-01-20 DIAGNOSIS — Z433 Encounter for attention to colostomy: Secondary | ICD-10-CM | POA: Diagnosis not present

## 2022-01-20 DIAGNOSIS — E785 Hyperlipidemia, unspecified: Secondary | ICD-10-CM | POA: Diagnosis not present

## 2022-01-20 DIAGNOSIS — Z48815 Encounter for surgical aftercare following surgery on the digestive system: Secondary | ICD-10-CM | POA: Diagnosis not present

## 2022-01-20 DIAGNOSIS — N179 Acute kidney failure, unspecified: Secondary | ICD-10-CM | POA: Diagnosis not present

## 2022-01-20 DIAGNOSIS — D649 Anemia, unspecified: Secondary | ICD-10-CM | POA: Diagnosis not present

## 2022-01-20 DIAGNOSIS — K631 Perforation of intestine (nontraumatic): Secondary | ICD-10-CM | POA: Diagnosis not present

## 2022-01-20 DIAGNOSIS — I10 Essential (primary) hypertension: Secondary | ICD-10-CM | POA: Diagnosis not present

## 2022-01-20 DIAGNOSIS — Z79891 Long term (current) use of opiate analgesic: Secondary | ICD-10-CM | POA: Diagnosis not present

## 2022-01-21 DIAGNOSIS — F410 Panic disorder [episodic paroxysmal anxiety] without agoraphobia: Secondary | ICD-10-CM | POA: Insufficient documentation

## 2022-01-21 DIAGNOSIS — I82419 Acute embolism and thrombosis of unspecified femoral vein: Secondary | ICD-10-CM

## 2022-01-21 HISTORY — DX: Acute embolism and thrombosis of unspecified femoral vein: I82.419

## 2022-01-21 NOTE — Assessment & Plan Note (Signed)
No new symptoms or concerns at this time.  Patient's not experiencing any fevers, chills, drainage.  Abdominal incision reportedly healing well.  Not visualized today to reduce infection risk as dressing was in place.

## 2022-01-21 NOTE — Assessment & Plan Note (Addendum)
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  Patient on treatment for approximately 1 week and stopped due to suspected side effect of severe anxiety.  No alarm symptoms present at this time.  Pedal pulses are palpable with normal coloration of skin.  No lower extremity edema noted.  No shortness of breath, chest pain.  Oxygen saturation appropriate. We will plan to start Eliquis for DVT treatment.  Patient instructed not to restart Xarelto.  Patient aware to monitor very closely for symptoms of worsening DVT or PE.  We will continue to monitor.

## 2022-01-21 NOTE — Assessment & Plan Note (Addendum)
Symptoms and presentation consistent with severe new diagnosis of panic disorder.  I also suspect a component of PTSD is likely present given the recent events the patient has been through.   Discussion with patient today that this type of reaction is common following significant health events such as she has experienced over the past several months.  Unclear at this time if Xarelto was the trigger for the symptoms or situational timing.  Given that the symptoms did start approximately 7 days after starting the medication I do recommend that she continue to remain off of the Xarelto at this time.  We will transition her to Eliquis at this time for DVT treatment. I am concerned about the patient's level of anxiety and significant symptoms.  I do not feel that the patient is a harm to herself or others at this time however her level of anxiety is extremely elevated.  I do feel that having someone with her at this time is necessary and beneficial to help keep her anxiety level under control. Discussed with patient that benzodiazepine use is common and appropriate for panic symptoms, but this is not recommended as a long-term medication.  She has not had any response with alprazolam reported at doses up to 2 mg.  Discussed with her the importance of not taking more medication than prescribed as this is a controlled substance and there are significant risks of respiratory depression and sedation which can be life-threatening with this type of medication.  Recommend that she stop taking alprazolam at this time as it has not been beneficial.  She is in agreement to this. Recommend starting hydroxyzine at bedtime to help with sleep and avoid using any benzodiazepine for sleep at this time to avoid risks of dependency long-term. We will start clonazepam for panic symptoms.  Recommend starting with the lowest dose prescribed, but may take 0.25 to 0.5 mg up to twice a day as needed for panic.  Recommend continuation of  citalopram 20 mg.  We may need to increase the dose to 40 mg at least for the short-term to help gain control of current symptoms At this time I do feel patient would significantly benefit from counseling services to help work through all that she has been through. Patient is in agreement to contact the office if she begins to have any new or worsening anxiety/panic symptoms.  If the office is closed she is aware she may present to the behavioral health urgent care, behavioral health hospital, or emergency room for immediate evaluation.

## 2022-01-21 NOTE — Assessment & Plan Note (Signed)
Currently stable at this time with ostomy in place.  Abdominal incision not visualized today as dressing was in place.  No new symptoms or concerns at this time.

## 2022-01-23 ENCOUNTER — Ambulatory Visit (INDEPENDENT_AMBULATORY_CARE_PROVIDER_SITE_OTHER): Payer: BC Managed Care – PPO | Admitting: Nurse Practitioner

## 2022-01-23 ENCOUNTER — Encounter (HOSPITAL_BASED_OUTPATIENT_CLINIC_OR_DEPARTMENT_OTHER): Payer: Self-pay | Admitting: Nurse Practitioner

## 2022-01-23 DIAGNOSIS — I10 Essential (primary) hypertension: Secondary | ICD-10-CM | POA: Diagnosis not present

## 2022-01-23 DIAGNOSIS — F410 Panic disorder [episodic paroxysmal anxiety] without agoraphobia: Secondary | ICD-10-CM | POA: Diagnosis not present

## 2022-01-23 DIAGNOSIS — Z433 Encounter for attention to colostomy: Secondary | ICD-10-CM | POA: Diagnosis not present

## 2022-01-23 DIAGNOSIS — E785 Hyperlipidemia, unspecified: Secondary | ICD-10-CM | POA: Diagnosis not present

## 2022-01-23 DIAGNOSIS — I82419 Acute embolism and thrombosis of unspecified femoral vein: Secondary | ICD-10-CM | POA: Diagnosis not present

## 2022-01-23 DIAGNOSIS — K631 Perforation of intestine (nontraumatic): Secondary | ICD-10-CM

## 2022-01-23 DIAGNOSIS — F339 Major depressive disorder, recurrent, unspecified: Secondary | ICD-10-CM

## 2022-01-23 DIAGNOSIS — Z48815 Encounter for surgical aftercare following surgery on the digestive system: Secondary | ICD-10-CM | POA: Diagnosis not present

## 2022-01-23 DIAGNOSIS — F419 Anxiety disorder, unspecified: Secondary | ICD-10-CM | POA: Diagnosis not present

## 2022-01-23 DIAGNOSIS — Z87891 Personal history of nicotine dependence: Secondary | ICD-10-CM | POA: Diagnosis not present

## 2022-01-23 DIAGNOSIS — D649 Anemia, unspecified: Secondary | ICD-10-CM | POA: Diagnosis not present

## 2022-01-23 DIAGNOSIS — N179 Acute kidney failure, unspecified: Secondary | ICD-10-CM | POA: Diagnosis not present

## 2022-01-23 DIAGNOSIS — K5790 Diverticulosis of intestine, part unspecified, without perforation or abscess without bleeding: Secondary | ICD-10-CM | POA: Diagnosis not present

## 2022-01-23 DIAGNOSIS — Z79891 Long term (current) use of opiate analgesic: Secondary | ICD-10-CM | POA: Diagnosis not present

## 2022-01-23 NOTE — Progress Notes (Signed)
Virtual Visit Encounter telephone visit.   I connected with  Michele [name] on 02/06/22 at  4:50 PM EDT by secure audio telemedicine application. I verified that I am speaking with the correct person using two identifiers.   I introduced myself as a Designer, jewellery with the practice. The limitations of evaluation and management by telemedicine discussed with the patient and the availability of in person appointments. The patient expressed verbal understanding and consent to proceed.  Participating parties in this visit include: Myself and patient  The patient is: Patient Location: Home I am: Provider Location: Office/Clinic Subjective:    CC and HPI: Michele [name] is a 56 y.o. year old female presenting for follow up of mood. Patient reports the following: Michele [name] was last seen January 17, 2022 to establish care.   At the time of her last visit Michele [name] was experiencing severe anxiety and depression symptoms related to a series of events that began in February 2023 with an elective hysterectomy that resulted in multiple surgical procedures for repair of bowel perforation and sepsis.  At the time of her last visit she had recently been discharged from the hospital.  That was her first visit to this office.  [name] reports today, "I feel myself again". She is currently taking her citalopram 20 mg daily, Vraylar 1.5 mg once a day ,and 25 mg of hydroxyzine at night to help with rest. She tells me that she did utilize the Klonopin 0.5 mg twice t on Monday and Tuesday of this week.  On Wednesday she reports taking 0.25 mg of Klonopin in the morning with no further doses during the day.  She tells me that she has not had to use any Klonopin today.  At her last visit we st started Eliquis for the DVT.  She tells me that she is tolerating this medication well and is not having any side effects.  No signs of bleeding or excessive bruising.  She has an appointment with her surgeon tomorrow to have her  sutures removed from her surgical wound.  She is looking forward to a road trip with her son to go visit with family in [place] after her appointment if she has clearance to travel.  She tells me that she is very excited about this trip and really looking forward to the time with her son and her family.    She tells me that she has previously been on Adderall for the past 10 years for ADHD symptoms.  She stopped this medication at the time of her hysterectomy and has not restarted it.  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  She will keep me informed when she returns to work if she needs to restart this medication.   Past medical history, Surgical history, Family history not pertinant except as noted below, Social history, Allergies, and medications have been entered into the medical record, reviewed, and corrections made.   Review of Systems:  All review of systems negative except what is listed in the HPI  Objective:    Alert and oriented x 4 Speaking in clear sentences with no shortness of breath. No distress.  Impression and Recommendations:    Problem List Items Addressed This Visit     Bowel perforation ([place])    No concerns at  this time.  She will be returning to the surgeon tomorrow for suture removal and evaluation of the site.      Panic disorder - Primary    Significant improvement of panic disorder and depressive symptoms with discontinuation of Xarelto and start of Eliquis.  She is continuing her citalopram daily and has started the Vraylar with no current side effects.  She is utilizing the hydroxyzine appropriately for rest and anxiety at night.  She is also utilizing the Klonopin appropriately with no signs of concerning use at this time.  I am very pleased with how well she has responded to the medication  changes and how well she sounds today.  I am pleased to hear that she will be going on a trip with her son very soon.  Recommend that patient continue current medications and let me know immediately if she begins to notice any new or worsening symptoms so that we can reevaluate and determine if medication changes need to be made at that time.  She expressed understanding.      Acute deep vein thrombosis (DVT) of femoral vein (HCC)    Currently doing very well on Eliquis with no signs of bleeding.  Patient denies shortness of breath, chest pain, lower extremity edema, or other concerning symptoms at this time.  Recommend continuation with Eliquis at this time.  Discussed with patient the importance of getting up and moving around frequently (at least every 2 hours) during her trip to [place] to reduce the risk of recurrence of DVT.  She expressed understanding.  I do recommend that she wear compression stockings for the trip to help prevent venous stasis and lower extremity edema.  She is aware of warning signs that would warrant immediate evaluation.  We will continue to monitor.       current treatment plan is effective, no change in therapy I discussed the assessment and treatment plan with the patient. The patient was provided an opportunity to ask questions and all were answered. The patient agreed with the plan and demonstrated an understanding of the instructions.   The patient was advised to call back or seek an in-person evaluation if the symptoms worsen or if the condition fails to improve as anticipated.  Follow-Up: in 3 months  I provided 22 minutes of non-face-to-face interaction with this non face-to-face encounter including intake, same-day documentation, and chart review.   Orma Render, NP , DNP, AGNP-c [place] [facility] [phone] (fax)

## 2022-02-04 DIAGNOSIS — K5790 Diverticulosis of intestine, part unspecified, without perforation or abscess without bleeding: Secondary | ICD-10-CM | POA: Diagnosis not present

## 2022-02-04 DIAGNOSIS — D649 Anemia, unspecified: Secondary | ICD-10-CM | POA: Diagnosis not present

## 2022-02-04 DIAGNOSIS — Z79891 Long term (current) use of opiate analgesic: Secondary | ICD-10-CM | POA: Diagnosis not present

## 2022-02-04 DIAGNOSIS — Z48815 Encounter for surgical aftercare following surgery on the digestive system: Secondary | ICD-10-CM | POA: Diagnosis not present

## 2022-02-04 DIAGNOSIS — K631 Perforation of intestine (nontraumatic): Secondary | ICD-10-CM | POA: Diagnosis not present

## 2022-02-04 DIAGNOSIS — I10 Essential (primary) hypertension: Secondary | ICD-10-CM | POA: Diagnosis not present

## 2022-02-04 DIAGNOSIS — E785 Hyperlipidemia, unspecified: Secondary | ICD-10-CM | POA: Diagnosis not present

## 2022-02-04 DIAGNOSIS — N179 Acute kidney failure, unspecified: Secondary | ICD-10-CM | POA: Diagnosis not present

## 2022-02-04 DIAGNOSIS — Z87891 Personal history of nicotine dependence: Secondary | ICD-10-CM | POA: Diagnosis not present

## 2022-02-04 DIAGNOSIS — Z433 Encounter for attention to colostomy: Secondary | ICD-10-CM | POA: Diagnosis not present

## 2022-02-04 DIAGNOSIS — F419 Anxiety disorder, unspecified: Secondary | ICD-10-CM | POA: Diagnosis not present

## 2022-02-05 ENCOUNTER — Other Ambulatory Visit (HOSPITAL_BASED_OUTPATIENT_CLINIC_OR_DEPARTMENT_OTHER): Payer: Self-pay

## 2022-02-06 ENCOUNTER — Encounter (HOSPITAL_BASED_OUTPATIENT_CLINIC_OR_DEPARTMENT_OTHER): Payer: Self-pay | Admitting: Nurse Practitioner

## 2022-02-06 ENCOUNTER — Other Ambulatory Visit (HOSPITAL_BASED_OUTPATIENT_CLINIC_OR_DEPARTMENT_OTHER): Payer: Self-pay

## 2022-02-06 MED ORDER — CARIPRAZINE HCL 1.5 MG PO CAPS
1.5000 mg | ORAL_CAPSULE | Freq: Every day | ORAL | 3 refills | Status: DC
  Filled 2022-02-06: qty 30, 30d supply, fill #0
  Filled 2022-04-09: qty 30, 30d supply, fill #1

## 2022-02-06 NOTE — Patient Instructions (Signed)
Be safe on your trip!  I hope you and your son have a wonderful time and you enjoy being with your family!  Please let me know if you have any questions or concerns.

## 2022-02-06 NOTE — Assessment & Plan Note (Signed)
Significant improvement of panic disorder and depressive symptoms with discontinuation of Xarelto and start of Eliquis.  She is continuing her citalopram daily and has started the Vraylar with no current side effects.  She is utilizing the hydroxyzine appropriately for rest and anxiety at night.  She is also utilizing the Klonopin appropriately with no signs of concerning use at this time.  I am very pleased with how well she has responded to the medication changes and how well she sounds today.  I am pleased to hear that she will be going on a trip with her son very soon.  Recommend that patient continue current medications and let me know immediately if she begins to notice any new or worsening symptoms so that we can reevaluate and determine if medication changes need to be made at that time.  She expressed understanding.

## 2022-02-06 NOTE — Assessment & Plan Note (Signed)
No concerns at this time.  She will be returning to the surgeon tomorrow for suture removal and evaluation of the site.

## 2022-02-06 NOTE — Assessment & Plan Note (Addendum)
Currently doing very well on Eliquis with no signs of bleeding.  Patient denies shortness of breath, chest pain, lower extremity edema, or other concerning symptoms at this time.  Recommend continuation with Eliquis at this time.  Discussed with patient the importance of getting up and moving around frequently (at least every 2 hours) during her trip to [place] to reduce the risk of recurrence of DVT.  She expressed understanding.  I do recommend that she wear compression stockings for the trip to help prevent venous stasis and lower extremity edema.  She is aware of warning signs that would warrant immediate evaluation.  We will continue to monitor.

## 2022-02-07 ENCOUNTER — Other Ambulatory Visit (HOSPITAL_BASED_OUTPATIENT_CLINIC_OR_DEPARTMENT_OTHER): Payer: Self-pay

## 2022-02-07 ENCOUNTER — Telehealth (HOSPITAL_BASED_OUTPATIENT_CLINIC_OR_DEPARTMENT_OTHER): Payer: Self-pay

## 2022-02-07 ENCOUNTER — Encounter (HOSPITAL_BASED_OUTPATIENT_CLINIC_OR_DEPARTMENT_OTHER): Payer: Self-pay

## 2022-02-07 DIAGNOSIS — Z0000 Encounter for general adult medical examination without abnormal findings: Secondary | ICD-10-CM

## 2022-02-07 DIAGNOSIS — F410 Panic disorder [episodic paroxysmal anxiety] without agoraphobia: Secondary | ICD-10-CM

## 2022-02-07 MED ORDER — APIXABAN 5 MG PO TABS
5.0000 mg | ORAL_TABLET | Freq: Two times a day (BID) | ORAL | 0 refills | Status: DC
  Filled 2022-02-07: qty 60, 30d supply, fill #0

## 2022-02-07 MED ORDER — CLONAZEPAM 1 MG PO TABS
0.5000 mg | ORAL_TABLET | Freq: Two times a day (BID) | ORAL | 2 refills | Status: DC | PRN
  Filled 2022-02-07 – 2022-02-17 (×2): qty 60, 30d supply, fill #0
  Filled 2022-04-17: qty 60, 30d supply, fill #1

## 2022-02-07 NOTE — Addendum Note (Signed)
Addended by: Darral Rishel, Clarise Cruz E on: 02/07/2022 05:24 PM   Modules accepted: Orders

## 2022-02-07 NOTE — Telephone Encounter (Signed)
Klonopin increased to 0.5 to 1 mg up to twice a day for panic and anxiety symptoms.  #60 sent to pharmacy with 2 refills available.

## 2022-02-07 NOTE — Telephone Encounter (Signed)
Patient feels that the klonopin is not working as well as it was in the beginning, she wants to increase dosage. She is leaving to go out of town 02/12/2022. I did send the '5mg'$  Eliquis per your request to the pharmacy and the patient is aware.

## 2022-02-09 DIAGNOSIS — Z433 Encounter for attention to colostomy: Secondary | ICD-10-CM | POA: Diagnosis not present

## 2022-02-12 ENCOUNTER — Other Ambulatory Visit (HOSPITAL_BASED_OUTPATIENT_CLINIC_OR_DEPARTMENT_OTHER): Payer: Self-pay

## 2022-02-13 ENCOUNTER — Other Ambulatory Visit (HOSPITAL_BASED_OUTPATIENT_CLINIC_OR_DEPARTMENT_OTHER): Payer: Self-pay

## 2022-02-17 ENCOUNTER — Other Ambulatory Visit (HOSPITAL_BASED_OUTPATIENT_CLINIC_OR_DEPARTMENT_OTHER): Payer: Self-pay | Admitting: Nurse Practitioner

## 2022-02-17 ENCOUNTER — Other Ambulatory Visit (HOSPITAL_BASED_OUTPATIENT_CLINIC_OR_DEPARTMENT_OTHER): Payer: Self-pay

## 2022-02-17 ENCOUNTER — Telehealth (HOSPITAL_BASED_OUTPATIENT_CLINIC_OR_DEPARTMENT_OTHER): Payer: Self-pay

## 2022-02-17 DIAGNOSIS — Z0000 Encounter for general adult medical examination without abnormal findings: Secondary | ICD-10-CM

## 2022-02-17 NOTE — Telephone Encounter (Signed)
Key: [id number] processed via Cover My Meds and denied.   Financial assistance for VRAYLAR may be available through the [employer] Program  Please advise

## 2022-02-20 ENCOUNTER — Other Ambulatory Visit (HOSPITAL_BASED_OUTPATIENT_CLINIC_OR_DEPARTMENT_OTHER): Payer: Self-pay

## 2022-02-20 ENCOUNTER — Other Ambulatory Visit (HOSPITAL_BASED_OUTPATIENT_CLINIC_OR_DEPARTMENT_OTHER): Payer: Self-pay | Admitting: Nurse Practitioner

## 2022-02-20 DIAGNOSIS — Z0000 Encounter for general adult medical examination without abnormal findings: Secondary | ICD-10-CM

## 2022-02-20 MED ORDER — APIXABAN 5 MG PO TABS
5.0000 mg | ORAL_TABLET | Freq: Two times a day (BID) | ORAL | 11 refills | Status: DC
  Filled 2022-02-20 – 2022-03-12 (×2): qty 60, 30d supply, fill #0

## 2022-02-21 ENCOUNTER — Other Ambulatory Visit (HOSPITAL_BASED_OUTPATIENT_CLINIC_OR_DEPARTMENT_OTHER): Payer: Self-pay

## 2022-02-25 DIAGNOSIS — Z933 Colostomy status: Secondary | ICD-10-CM | POA: Diagnosis not present

## 2022-02-25 NOTE — Telephone Encounter (Signed)
PA submitted for Appeal via fax. Awaiting response.

## 2022-02-26 DIAGNOSIS — Z9049 Acquired absence of other specified parts of digestive tract: Secondary | ICD-10-CM | POA: Diagnosis not present

## 2022-02-26 DIAGNOSIS — Z933 Colostomy status: Secondary | ICD-10-CM | POA: Diagnosis not present

## 2022-03-04 NOTE — Telephone Encounter (Signed)
Checked PA on 7/25. Still waiting for insurance determination.

## 2022-03-05 ENCOUNTER — Ambulatory Visit (HOSPITAL_COMMUNITY)
Admission: RE | Admit: 2022-03-05 | Discharge: 2022-03-05 | Disposition: A | Discharge status: Home / Self Care | Payer: BC Managed Care – PPO | Source: Ambulatory Visit

## 2022-03-05 ENCOUNTER — Encounter (HOSPITAL_COMMUNITY): Payer: Self-pay

## 2022-03-05 VITALS — BP 134/85 | HR 102 | Temp 98.3°F | Resp 16

## 2022-03-05 DIAGNOSIS — J0100 Acute maxillary sinusitis, unspecified: Secondary | ICD-10-CM

## 2022-03-05 DIAGNOSIS — J069 Acute upper respiratory infection, unspecified: Secondary | ICD-10-CM | POA: Diagnosis not present

## 2022-03-05 DIAGNOSIS — G9331 Postviral fatigue syndrome: Secondary | ICD-10-CM

## 2022-03-05 MED ORDER — AMOXICILLIN-POT CLAVULANATE 875-125 MG PO TABS: 1.0000 | ORAL_TABLET | Freq: Two times a day (BID) | ORAL | 0 refills | Status: DC

## 2022-03-05 NOTE — ED Triage Notes (Signed)
Onset x 4 days of coughing,sore throat and itchy throat. Pt thinks she has a sinus infection.

## 2022-03-05 NOTE — Discharge Instructions (Signed)
Advised to continue using the Flonase nasal spray 2 sprays each nostril once a day to help decrease the sinus inflammation and drainage. Advised to take the Augmentin 875 1 twice daily with food to treat the sinus infection. Advised to follow-up with PCP or return to urgent care if symptoms fail to improve.

## 2022-03-05 NOTE — ED Provider Notes (Signed)
[facility]    CSN: [id number] Arrival date & time: 03/05/22  1524      History   Chief Complaint Chief Complaint  Patient presents with   Sore Throat    I feel like I have a sinus infection - Entered by patient    HPI Michele [name] is a 56 y.o. female.   56 year old female presents with sinus congestion.  Patient relates she has been around her older children that have had respiratory infections over the past week.  Patient indicates for the past 4 days she has been having some upper respiratory symptoms with sinus congestion, frontal and maxillary sinus tenderness, upper teeth sensitivity and pain.  Patient relates she has had some postnasal drip and rhinitis with purulent production.  Patient is also had some bilateral ear congestion and pressure.  Patient has had some mild chest congestion with intermittent cough.  Patient relates she has not had any fever or chills.  She indicates that she recently had sigmoid colectomy with colostomy surgery in March 2023, to where the incision has healed well but she is still hesitant to cough forcefully.  Patient has been using her Flonase nasal spray to control her congestion and drainage, but she is concerned about her weakened immune system and being able to catch infections easily.   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   Type 2 diabetes mellitus without complication, without long-term current use of insulin ([place]) 07/02/2020   Vitamin D deficiency 06/29/2020   Hyperlipidemia 06/15/2013    Past Surgical History:  Procedure Laterality Date   CESAREAN SECTION      TWO of them   COLOSTOMY Left 11/05/2021   Procedure: COLOSTOMY;  Surgeon: Sanjuana Kava, MD;  Location: [facility];  Service: Gynecology;  Laterality: Left;   CYSTOSCOPY  10/25/2021   Procedure: CYSTOSCOPY;  Surgeon: Sanjuana Kava, MD;  Location: [facility];  Service: Gynecology;;   dental implants N/A 2016   lower 2 teeth   HYSTERECTOMY ABDOMINAL WITH SALPINGECTOMY  10/25/2021   Procedure: HYSTERECTOMY ABDOMINAL WITH BILATERAL SALPINGECTOMY;  Surgeon: Sanjuana Kava, MD;  Location: [facility];  Service: Gynecology;;   HYSTEROSCOPY WITH D & C  01/30/2017   Procedure: DILATATION AND CURETTAGE /HYSTEROSCOPY;  Surgeon: Sanjuana Kava, MD;  Location: [facility];  Service: Gynecology;;   IR CATHETER TUBE CHANGE  12/02/2021   IR GUIDED DRAIN W CATHETER PLACEMENT  12/02/2021   IR RADIOLOGIST EVAL & MGMT  12/17/2021   IR SINUS/FIST TUBE CHK-NON GI  12/02/2021   LAPAROSCOPY  10/25/2021   Procedure: LAPAROSCOPY DIAGNOSTIC;  Surgeon: Sanjuana Kava, MD;  Location: [facility];  Service: Gynecology;;   LAPAROTOMY N/A 11/05/2021   Procedure: EXPLORATORY LAPAROTOMY AND COLECTOMY ;  Surgeon: Sanjuana Kava, MD;  Location: [facility];  Service: Gynecology;  Laterality: N/A;   LAPAROTOMY N/A 11/14/2021   Procedure: EXPLORATORY  LAPAROTOMY AND ABDOMINAL WALL CLOSURE;  Surgeon: Erroll Luna, MD;  Location: [facility];  Service: General;  Laterality: N/A;    OB History   No obstetric history on file.      Home Medications  Prior to Admission medications   Medication Sig Start Date End Date Taking? Authorizing Provider  amoxicillin-clavulanate (AUGMENTIN) 875-125 MG tablet Take 1 tablet by mouth every 12 (twelve) hours. 03/05/22  Yes Nyoka Lint, PA-C  amphetamine-dextroamphetamine (ADDERALL XR) 30 MG 24 hr capsule Take 30 mg by mouth daily. 02/26/22   [provider]  apixaban (ELIQUIS) 5 MG TABS tablet Take 1 tablet (5 mg total) by mouth 2 (two) times daily. 02/20/22   Orma Render, NP  ascorbic acid (VITAMIN C) 500 MG tablet Take 1 tablet (500  mg total) by mouth 2 (two) times daily. 12/05/21   Jill Alexanders, PA-C  cariprazine (VRAYLAR) 1.5 MG capsule Take 1 capsule (1.5 mg total) by mouth daily. 02/06/22   Orma Render, NP  citalopram (CELEXA) 20 MG tablet Take 20 mg by mouth daily.    [provider]  clonazePAM (KLONOPIN) 1 MG tablet Take 0.5-1 tablets (0.5-1 mg total) by mouth 2 (two) times daily as needed (severe panic symptoms). 02/07/22   Orma Render, NP  ferrous sulfate 325 (65 FE) MG tablet Take 1 tablet (325 mg total) by mouth daily with breakfast. 12/06/21 01/05/22  Jill Alexanders, PA-C  gabapentin (NEURONTIN) 100 MG capsule TAKE 1 CAPSULE BY MOUTH EVERY 8 HOURS AS NEEDED    [provider]  hydrOXYzine (ATARAX) 25 MG tablet Take 1 tablet (25 mg total) by mouth at bedtime for sleep and anxiety, and may repeat dose one time if needed. 01/17/22   Orma Render, NP  lisdexamfetamine (VYVANSE) 40 MG capsule Take 1 capsule every day by oral route.    [provider]  polyethylene glycol (MIRALAX / GLYCOLAX) 17 g packet Take 17 g by mouth 2 (two) times daily as needed. 12/05/21   Jill Alexanders, PA-C  sertraline (ZOLOFT) 25 MG tablet Take 25 mg by mouth daily. 01/11/22   [provider]    Family History Family History  Problem Relation Age of Onset   Heart attack Father    Hypertension Sister     Social History Social History   Tobacco Use   Smoking status: Former    Packs/day: 0.50    Years: 15.00    Total pack years: 7.50    Types: Cigarettes    Quit date: 2003    Years since quitting: 20.5   Smokeless tobacco: Never   Tobacco comments:    quit 2003  Vaping Use   Vaping Use: Never used  Substance Use Topics   Alcohol use: No   Drug use: No     Allergies   Atorvastatin and Sulfa antibiotics   Review of Systems Review of Systems  HENT:  Positive for postnasal drip, rhinorrhea and sinus pressure.      Physical Exam Triage Vital Signs ED Triage Vitals  [03/05/22 1542]  Enc Vitals Group     BP 134/85     Pulse Rate (!) 102     Resp 16     Temp 98.3 F (36.8 C)     Temp Source Oral     SpO2 98 %     Weight      Height      Head Circumference      Peak Flow      Pain Score      Pain Loc      Pain Edu?      Excl. in [place]?    No data found.  Updated Vital Signs BP 134/85 (  BP Location: Left Arm)   Pulse (!) 102   Temp 98.3 F (36.8 C) (Oral)   Resp 16   LMP 10/07/2021 (Approximate)   SpO2 98%   Visual Acuity Right Eye Distance:   Left Eye Distance:   Bilateral Distance:    Right Eye Near:   Left Eye Near:    Bilateral Near:     Physical Exam   UC Treatments / Results  Labs (all labs ordered are listed, but only abnormal results are displayed) Labs Reviewed - No data to display  EKG   Radiology No results found.  Procedures Procedures (including critical care time)  Medications Ordered in UC Medications - No data to display  Initial Impression / Assessment and Plan / UC Course  I have reviewed the triage vital signs and the nursing notes.  Pertinent labs & imaging results that were available during my care of the patient were reviewed by me and considered in my medical decision making (see chart for details).    Plan: 1.  Advised to continue using Flonase nasal spray 2 sprays each nostril once a day to help control the sinus congestion. 2.  Advised to take Augmentin 875 1 twice daily with food to help treat the sinus infection. 3.  Advised to follow-up with PCP or return to the urgent care if symptoms fail to improve. Final Clinical Impressions(s) / UC Diagnoses   Final diagnoses:  Viral upper respiratory tract infection  Acute non-recurrent maxillary sinusitis  Postviral fatigue syndrome     Discharge Instructions      Advised to continue using the Flonase nasal spray 2 sprays each nostril once a day to help decrease the sinus inflammation and drainage. Advised to take the Augmentin 875 1  twice daily with food to treat the sinus infection. Advised to follow-up with PCP or return to urgent care if symptoms fail to improve.    ED Prescriptions     Medication Sig Dispense Auth. Provider   amoxicillin-clavulanate (AUGMENTIN) 875-125 MG tablet Take 1 tablet by mouth every 12 (twelve) hours. 14 tablet Nyoka Lint, PA-C      PDMP not reviewed this encounter.   Nyoka Lint, PA-C 03/05/22 1646

## 2022-03-12 ENCOUNTER — Other Ambulatory Visit (HOSPITAL_BASED_OUTPATIENT_CLINIC_OR_DEPARTMENT_OTHER): Payer: Self-pay

## 2022-03-26 DIAGNOSIS — C188 Malignant neoplasm of overlapping sites of colon: Secondary | ICD-10-CM | POA: Diagnosis not present

## 2022-03-26 DIAGNOSIS — Z933 Colostomy status: Secondary | ICD-10-CM | POA: Diagnosis not present

## 2022-03-26 DIAGNOSIS — C20 Malignant neoplasm of rectum: Secondary | ICD-10-CM | POA: Diagnosis not present

## 2022-04-07 ENCOUNTER — Encounter (HOSPITAL_BASED_OUTPATIENT_CLINIC_OR_DEPARTMENT_OTHER): Payer: BC Managed Care – PPO | Admitting: Nurse Practitioner

## 2022-04-08 ENCOUNTER — Encounter (HOSPITAL_BASED_OUTPATIENT_CLINIC_OR_DEPARTMENT_OTHER): Payer: Self-pay

## 2022-04-09 ENCOUNTER — Other Ambulatory Visit (HOSPITAL_BASED_OUTPATIENT_CLINIC_OR_DEPARTMENT_OTHER): Payer: Self-pay

## 2022-04-15 ENCOUNTER — Ambulatory Visit: Payer: Self-pay | Admitting: Surgery

## 2022-04-15 DIAGNOSIS — Z6830 Body mass index (BMI) 30.0-30.9, adult: Secondary | ICD-10-CM | POA: Diagnosis not present

## 2022-04-15 DIAGNOSIS — Z933 Colostomy status: Secondary | ICD-10-CM | POA: Diagnosis not present

## 2022-04-15 DIAGNOSIS — K5909 Other constipation: Secondary | ICD-10-CM | POA: Diagnosis not present

## 2022-04-15 DIAGNOSIS — K631 Perforation of intestine (nontraumatic): Secondary | ICD-10-CM | POA: Diagnosis not present

## 2022-04-17 ENCOUNTER — Other Ambulatory Visit (HOSPITAL_BASED_OUTPATIENT_CLINIC_OR_DEPARTMENT_OTHER): Payer: Self-pay | Admitting: Nurse Practitioner

## 2022-04-17 ENCOUNTER — Encounter (HOSPITAL_BASED_OUTPATIENT_CLINIC_OR_DEPARTMENT_OTHER): Payer: Self-pay | Admitting: Nurse Practitioner

## 2022-04-17 ENCOUNTER — Ambulatory Visit (INDEPENDENT_AMBULATORY_CARE_PROVIDER_SITE_OTHER): Payer: BC Managed Care – PPO | Admitting: Nurse Practitioner

## 2022-04-17 ENCOUNTER — Other Ambulatory Visit (HOSPITAL_BASED_OUTPATIENT_CLINIC_OR_DEPARTMENT_OTHER): Payer: Self-pay

## 2022-04-17 VITALS — BP 106/93 | HR 98 | Ht 60.0 in | Wt 161.2 lb

## 2022-04-17 DIAGNOSIS — F339 Major depressive disorder, recurrent, unspecified: Secondary | ICD-10-CM

## 2022-04-17 DIAGNOSIS — E559 Vitamin D deficiency, unspecified: Secondary | ICD-10-CM | POA: Diagnosis not present

## 2022-04-17 DIAGNOSIS — E7800 Pure hypercholesterolemia, unspecified: Secondary | ICD-10-CM | POA: Diagnosis not present

## 2022-04-17 DIAGNOSIS — F410 Panic disorder [episodic paroxysmal anxiety] without agoraphobia: Secondary | ICD-10-CM | POA: Diagnosis not present

## 2022-04-17 DIAGNOSIS — Z23 Encounter for immunization: Secondary | ICD-10-CM | POA: Diagnosis not present

## 2022-04-17 DIAGNOSIS — Z0000 Encounter for general adult medical examination without abnormal findings: Secondary | ICD-10-CM | POA: Diagnosis not present

## 2022-04-17 DIAGNOSIS — E119 Type 2 diabetes mellitus without complications: Secondary | ICD-10-CM

## 2022-04-17 DIAGNOSIS — F900 Attention-deficit hyperactivity disorder, predominantly inattentive type: Secondary | ICD-10-CM

## 2022-04-17 DIAGNOSIS — R21 Rash and other nonspecific skin eruption: Secondary | ICD-10-CM | POA: Diagnosis not present

## 2022-04-17 DIAGNOSIS — K631 Perforation of intestine (nontraumatic): Secondary | ICD-10-CM

## 2022-04-17 MED ORDER — CITALOPRAM HYDROBROMIDE 20 MG PO TABS
20.0000 mg | ORAL_TABLET | Freq: Every day | ORAL | 3 refills | Status: DC
  Filled 2022-04-17 – 2022-05-16 (×2): qty 90, 90d supply, fill #0
  Filled 2022-07-05 – 2022-12-02 (×4): qty 90, 90d supply, fill #1
  Filled 2022-12-11 (×3): qty 90, 90d supply, fill #0

## 2022-04-17 MED ORDER — ROSUVASTATIN CALCIUM 5 MG PO TABS: 5.0000 mg | ORAL_TABLET | Freq: Every day | ORAL | 3 refills | Status: DC

## 2022-04-17 MED ORDER — AMPHETAMINE-DEXTROAMPHET ER 30 MG PO CP24
30.0000 mg | ORAL_CAPSULE | Freq: Every day | ORAL | 0 refills | Status: DC
  Filled 2022-04-17: qty 30, 30d supply, fill #0

## 2022-04-17 MED ORDER — MOMETASONE FUROATE 0.1 % EX CREA
1.0000 | TOPICAL_CREAM | CUTANEOUS | 2 refills | Status: DC | PRN
  Filled 2022-04-17: qty 45, 14d supply, fill #0

## 2022-04-17 MED ORDER — CLONAZEPAM 1 MG PO TABS
0.5000 mg | ORAL_TABLET | Freq: Two times a day (BID) | ORAL | 2 refills | Status: DC | PRN
  Filled 2022-04-17: qty 60, 30d supply, fill #0
  Filled 2022-05-16: qty 60, 30d supply, fill #1
  Filled 2022-07-05: qty 60, 30d supply, fill #2

## 2022-04-17 NOTE — Progress Notes (Signed)
BP (!) 106/93   Pulse 98   Ht 5' (1.524 m)   Wt 161 lb 3.2 oz (73.1 kg)   LMP 10/07/2021 (Approximate)   SpO2 99%   BMI 31.48 kg/m    Subjective:    Patient ID: Michele [name], female    DOB: 11/17/1965, 56 y.o.   MRN: [id number]  HPI: Michele [name] is a 56 y.o. female presenting on 04/17/2022 for comprehensive medical examination.   Current medical concerns include: Upcoming surgery Undergoing takedown of colostomy in the next few weeks with [facility]. She needs a physical exam for clearance.  She reports regular vision exams q1-5y:  yes- due for diabetic eye exam She reports regular dental exams q 63m no Her diet consists of:  primarily healthy- working to follow low carbohydrate diet She endorses exercise and/or activity of:  walking 1 hour a day She works in:  currently unemployed until after surgery is completed  She denies ETOH use  She denies nictoine use  She denies illegal substance use   She is not having menstrual periods.  She  denies abnormal bleeding. She  denies menopausal symptoms.  She is is not sexually active. She currently has 0 sexual partners.  She denies concerns today about STI: testing ordered: no  She denies concerns about skin changes today  She denies concerns about bowel changes today  She denies concerns about bladder changes today   She endorses some anxiety and sadness about her current situation and upcoming surgery. She is hopeful that return to normal functioning will help with this. She is taking her medication as prescribed for mood and tolerating well.   Most Recent Depression Screen:     01/23/2022    4:42 PM 01/17/2022    8:27 AM  Depression screen PHQ 2/9  Decreased Interest 0 3  Down, Depressed, Hopeless 0 3  PHQ - 2 Score 0 6  Altered sleeping 0 2  Tired, decreased energy 0 1  Change in appetite 0 3  Feeling bad or failure about yourself  0 2  Trouble concentrating 0 0  Moving slowly or fidgety/restless 0 0   Suicidal thoughts 0 0  PHQ-9 Score 0 14  Difficult doing work/chores Not difficult at all Extremely dIfficult   Most Recent Anxiety Screen:      No data to display         Most Recent Fall Screen:    01/17/2022    8:27 AM  Fall Risk   Falls in the past year? 0  Number falls in past yr: 0  Injury with Fall? 0  Risk for fall due to : No Fall Risks  Follow up Falls evaluation completed;Education provided    All ROS negative except what is listed above and in the HPI.   Past medical history, surgical history, medications, allergies, family history and social history reviewed with patient today and changes made to appropriate areas of the chart.  Past Medical History:  Past Medical History:  Diagnosis Date   Allergy    Anemia    Anxiety    Depression    Hyperlipidemia    Hypertension    with pregnancies   Medications:  Current Outpatient Medications on File Prior to Visit  Medication Sig   ascorbic acid (VITAMIN C) 500 MG tablet Take 1 tablet (500 mg total) by mouth 2 (two) times daily.   polyethylene glycol (MIRALAX / GLYCOLAX) 17 g packet Take 17 g by mouth 2 (two) times daily  as needed.   ferrous sulfate 325 (65 FE) MG tablet Take 1 tablet (325 mg total) by mouth daily with breakfast.   No current facility-administered medications on file prior to visit.   Surgical History:  Past Surgical History:  Procedure Laterality Date   CESAREAN SECTION     TWO of them   COLOSTOMY Left 11/05/2021   Procedure: COLOSTOMY;  Surgeon: Sanjuana Kava, MD;  Location: [facility];  Service: Gynecology;  Laterality: Left;   CYSTOSCOPY  10/25/2021   Procedure: CYSTOSCOPY;  Surgeon: Sanjuana Kava, MD;  Location: [facility];  Service: Gynecology;;   dental implants N/A 2016   lower 2 teeth   HYSTERECTOMY ABDOMINAL WITH SALPINGECTOMY  10/25/2021   Procedure: HYSTERECTOMY ABDOMINAL WITH BILATERAL SALPINGECTOMY;  Surgeon: Sanjuana Kava, MD;  Location: [facility];  Service: Gynecology;;   HYSTEROSCOPY WITH D & C   01/30/2017   Procedure: DILATATION AND CURETTAGE /HYSTEROSCOPY;  Surgeon: Sanjuana Kava, MD;  Location: [facility];  Service: Gynecology;;   IR CATHETER TUBE CHANGE  12/02/2021   IR GUIDED DRAIN W CATHETER PLACEMENT  12/02/2021   IR RADIOLOGIST EVAL & MGMT  12/17/2021   IR SINUS/FIST TUBE CHK-NON GI  12/02/2021   LAPAROSCOPY  10/25/2021   Procedure: LAPAROSCOPY DIAGNOSTIC;  Surgeon: Sanjuana Kava, MD;  Location: [facility];  Service: Gynecology;;   LAPAROTOMY N/A 11/05/2021   Procedure: EXPLORATORY LAPAROTOMY AND COLECTOMY ;  Surgeon: Sanjuana Kava, MD;  Location: [facility];  Service: Gynecology;  Laterality: N/A;   LAPAROTOMY N/A 11/14/2021   Procedure: EXPLORATORY  LAPAROTOMY AND ABDOMINAL WALL CLOSURE;  Surgeon: Erroll Luna, MD;  Location: [facility];  Service: General;  Laterality: N/A;   Allergies:  Allergies  Allergen Reactions   Atorvastatin Other (See Comments) and Rash    Muscle aches on Lipitor 20 mg and 40 mg  Muscle aches on Lipitor 20 mg and 40 mg  Muscle aches on Lipitor 20 mg and 40 mg  Muscle aches on Lipitor 20 mg and 40 mg  Muscle aches on Lipitor 20 mg and 40 mg    Sulfa Antibiotics Nausea And Vomiting, Other (See Comments) and Rash   Social History:  Social History   Socioeconomic History   Marital status: Divorced    Spouse name: Not on file   Number of children: Not on file   Years of education: Not on file   Highest education level: Not on file  Occupational History   Not on file  Tobacco Use   Smoking status: Former    Packs/day: 0.50    Years: 15.00    Total pack years: 7.50    Types: Cigarettes    Quit date: 2003    Years since quitting: 20.7   Smokeless tobacco: Never   Tobacco comments:    quit 2003  Vaping Use   Vaping Use: Never used  Substance and Sexual Activity   Alcohol use: No   Drug use: No   Sexual activity: Yes    Birth control/protection: Surgical    Comment: 2006  Other Topics Concern   Not on file  Social History Narrative   The pt is a homemaker  -pt quit smoking cigarettes 2004    Social Determinants of Health   Financial Resource Strain: Not on file  Food Insecurity: Not on file  Transportation Needs: Not on file  Physical Activity: Not on file  Stress: Not on file  Social Connections: Not on file  Intimate Partner Violence: Not on file   Social History  Tobacco Use  Smoking Status Former   Packs/day: 0.50   Years: 15.00   Total pack years: 7.50   Types: Cigarettes   Quit date: 2003   Years since quitting: 20.7  Smokeless Tobacco Never  Tobacco Comments   quit 2003   Social History   Substance and Sexual Activity  Alcohol Use No   Family History:  Family History  Problem Relation Age of Onset   Colon polyps Mother    Heart attack Father    Hypertension Sister    Colon cancer Neg Hx    Esophageal cancer Neg Hx    Rectal cancer Neg Hx    Stomach cancer Neg Hx        Objective:    BP (!) 106/93   Pulse 98   Ht 5' (1.524 m)   Wt 161 lb 3.2 oz (73.1 kg)   LMP 10/07/2021 (Approximate)   SpO2 99%   BMI 31.48 kg/m   Wt Readings from Last 3 Encounters:  05/01/22 150 lb (68 kg)  04/17/22 161 lb 3.2 oz (73.1 kg)  01/17/22 160 lb (72.6 kg)    Physical Exam Vitals and nursing note reviewed.  Constitutional:      General: She is not in acute distress.    Appearance: Normal appearance.  HENT:     Head: Normocephalic and atraumatic.     Right Ear: Hearing, tympanic membrane, ear canal and external ear normal.     Left Ear: Hearing, tympanic membrane, ear canal and external ear normal.     Nose: Nose normal.     Right Sinus: No maxillary sinus tenderness or frontal sinus tenderness.     Left Sinus: No maxillary sinus tenderness or frontal sinus tenderness.     Mouth/Throat:     Lips: Pink.     Mouth: Mucous membranes are moist.     Pharynx: Oropharynx is clear.  Eyes:     General: Lids are normal. Vision grossly intact.     Extraocular Movements: Extraocular movements intact.      Conjunctiva/sclera: Conjunctivae normal.     Pupils: Pupils are equal, round, and reactive to light.     Funduscopic exam:    Right eye: Red reflex present.        Left eye: Red reflex present.    Visual Fields: Right eye visual fields normal and left eye visual fields normal.  Neck:     Thyroid: No thyromegaly.     Vascular: No carotid bruit.  Cardiovascular:     Rate and Rhythm: Normal rate and regular rhythm.     Chest Wall: PMI is not displaced.     Pulses: Normal pulses.          Dorsalis pedis pulses are 2+ on the right side and 2+ on the left side.       Posterior tibial pulses are 2+ on the right side and 2+ on the left side.     Heart sounds: Normal heart sounds. No murmur heard. Pulmonary:     Effort: Pulmonary effort is normal. No respiratory distress.     Breath sounds: Normal breath sounds.  Abdominal:     General: Abdomen is flat. Bowel sounds are normal. There is no distension.     Palpations: Abdomen is soft. There is no hepatomegaly or splenomegaly.     Tenderness: There is abdominal tenderness. There is no right CVA tenderness, left CVA tenderness or guarding.  Musculoskeletal:        General: Normal  range of motion.     Cervical back: Full passive range of motion without pain, normal range of motion and neck supple. No tenderness.     Right lower leg: No edema.     Left lower leg: No edema.  Feet:     Left foot:     Toenail Condition: Left toenails are normal.  Lymphadenopathy:     Cervical: No cervical adenopathy.     Upper Body:     Right upper body: No supraclavicular adenopathy.     Left upper body: No supraclavicular adenopathy.  Skin:    General: Skin is warm and dry.     Capillary Refill: Capillary refill takes less than 2 seconds.     Nails: There is no clubbing.  Neurological:     General: No focal deficit present.     Mental Status: She is alert and oriented to person, place, and time.     GCS: GCS eye subscore is 4. GCS verbal subscore is 5.  GCS motor subscore is 6.     Sensory: Sensation is intact.     Motor: Motor function is intact.     Coordination: Coordination is intact.     Gait: Gait is intact.     Deep Tendon Reflexes: Reflexes are normal and symmetric.  Psychiatric:        Attention and Perception: Attention normal.        Mood and Affect: Mood normal.        Speech: Speech normal.        Behavior: Behavior normal. Behavior is cooperative.        Thought Content: Thought content normal.        Cognition and Memory: Cognition and memory normal.        Judgment: Judgment normal.     Results for orders placed or performed in visit on 04/17/22  CBC With Diff/Platelet  Result Value Ref Range   WBC 4.6 3.4 - 10.8 x10E3/uL   RBC 5.40 (H) 3.77 - 5.28 x10E6/uL   Hemoglobin 13.0 11.1 - 15.9 g/dL   Hematocrit 40.7 34.0 - 46.6 %   MCV 75 (L) 79 - 97 fL   MCH 24.1 (L) 26.6 - 33.0 pg   MCHC 31.9 31.5 - 35.7 g/dL   RDW 17.3 (H) 11.7 - 15.4 %   Platelets 372 150 - 450 x10E3/uL   Neutrophils 47 Not Estab. %   Lymphs 40 Not Estab. %   Monocytes 10 Not Estab. %   Eos 2 Not Estab. %   Basos 1 Not Estab. %   Neutrophils Absolute 2.2 1.4 - 7.0 x10E3/uL   Lymphocytes Absolute 1.9 0.7 - 3.1 x10E3/uL   Monocytes Absolute 0.5 0.1 - 0.9 x10E3/uL   EOS (ABSOLUTE) 0.1 0.0 - 0.4 x10E3/uL   Basophils Absolute 0.0 0.0 - 0.2 x10E3/uL   Immature Granulocytes 0 Not Estab. %   Immature Grans (Abs) 0.0 0.0 - 0.1 x10E3/uL  Comprehensive metabolic panel  Result Value Ref Range   Glucose 90 70 - 99 mg/dL   BUN 14 6 - 24 mg/dL   Creatinine, Ser 0.80 0.57 - 1.00 mg/dL   eGFR 87 >59 mL/min/1.73   BUN/Creatinine Ratio 18 9 - 23   Sodium 139 134 - 144 mmol/L   Potassium 5.2 3.5 - 5.2 mmol/L   Chloride 101 96 - 106 mmol/L   CO2 24 20 - 29 mmol/L   Calcium 10.2 8.7 - 10.2 mg/dL   Total Protein 7.3 6.0 -  8.5 g/dL   Albumin 4.4 3.8 - 4.9 g/dL   Globulin, Total 2.9 1.5 - 4.5 g/dL   Albumin/Globulin Ratio 1.5 1.2 - 2.2   Bilirubin Total  0.2 0.0 - 1.2 mg/dL   Alkaline Phosphatase 92 44 - 121 IU/L   AST 13 0 - 40 IU/L   ALT 10 0 - 32 IU/L  Iron, TIBC and Ferritin Panel  Result Value Ref Range   Total Iron Binding Capacity 370 250 - 450 ug/dL   UIBC 330 131 - 425 ug/dL   Iron 40 27 - 159 ug/dL   Iron Saturation 11 (L) 15 - 55 %   Ferritin 16 15 - 150 ng/mL  Hemoglobin A1c  Result Value Ref Range   Hgb A1c MFr Bld 6.5 (H) 4.8 - 5.6 %   Est. average glucose Bld gHb Est-mCnc 140 mg/dL  Lipid panel  Result Value Ref Range   Cholesterol, Total 260 (H) 100 - 199 mg/dL   Triglycerides 129 0 - 149 mg/dL   HDL 69 >39 mg/dL   VLDL Cholesterol Cal 23 5 - 40 mg/dL   LDL Chol Calc (NIH) 168 (H) 0 - 99 mg/dL   Chol/HDL Ratio 3.8 0.0 - 4.4 ratio  Thyroid Panel With TSH  Result Value Ref Range   TSH 1.800 0.450 - 4.500 uIU/mL   T4, Total 9.3 4.5 - 12.0 ug/dL   T3 Uptake Ratio 26 24 - 39 %   Free Thyroxine Index 2.4 1.2 - 4.9  VITAMIN D 25 Hydroxy (Vit-D Deficiency, Fractures)  Result Value Ref Range   Vit D, 25-Hydroxy 30.9 30.0 - 100.0 ng/mL    MMUNIZATIONS:   - Tdap: Tetanus vaccination status reviewed: last tetanus booster within 10 years. - Influenza: Administered today - Pneumovax: Not applicable - Prevnar: Not applicable - HPV: Not applicable - Zostavax vaccine: Not applicable  SCREENING COMPLETED: - Pap smear: Declined - STI testing:Not Applicable -Mammogram:  utd - Colonoscopy: { deferred - Bone Density: {Not Applicable -Hearing Test: Not Applicable -Spirometry: Not Applicable     Assessment & Plan:   Problem List Items Addressed This Visit     Hyperlipidemia    Chronic.  Labs today.      Relevant Medications   rosuvastatin (CRESTOR) 5 MG tablet   Other Relevant Orders   MM Digital Screening   CBC With Diff/Platelet (Completed)   Comprehensive metabolic panel (Completed)   Iron, TIBC and Ferritin Panel (Completed)   Hemoglobin A1c (Completed)   Lipid panel (Completed)   Thyroid Panel With  TSH (Completed)   VITAMIN D 25 Hydroxy (Vit-D Deficiency, Fractures) (Completed)   Bowel perforation ([place])    Healing well.  No alarm symptoms present at this time.  Will defer colonoscopy to the judgment of GI.      Relevant Medications   rosuvastatin (CRESTOR) 5 MG tablet   Other Relevant Orders   MM Digital Screening   CBC With Diff/Platelet (Completed)   Comprehensive metabolic panel (Completed)   Iron, TIBC and Ferritin Panel (Completed)   Hemoglobin A1c (Completed)   Lipid panel (Completed)   Thyroid Panel With TSH (Completed)   VITAMIN D 25 Hydroxy (Vit-D Deficiency, Fractures) (Completed)   Type 2 diabetes mellitus without complication, without long-term current use of insulin (HCC)    Chronic.  Currently diet controlled.  Labs pending.  No alarm symptoms present.  Patient is due for eye exam and foot exam next visit.  We will also need urine microalbumin.  Relevant Medications   rosuvastatin (CRESTOR) 5 MG tablet   Vitamin D deficiency    Chronic.  Labs pending.      Relevant Orders   VITAMIN D 25 Hydroxy (Vit-D Deficiency, Fractures) (Completed)   Panic disorder    Chronic.  Well-controlled with Celexa and as needed clonazepam.  Using minimally as instructed.  No alarm symptoms present today.      Relevant Medications   citalopram (CELEXA) 20 MG tablet   clonazePAM (KLONOPIN) 1 MG tablet   Attention deficit hyperactivity disorder (ADHD), predominantly inattentive type    Chronic.  Well-controlled on Adderall 30 mg.  PMP reviewed today.  No changes.      Relevant Medications   amphetamine-dextroamphetamine (ADDERALL XR) 30 MG 24 hr capsule   Depression, recurrent (HCC)    Well-controlled with Celexa.  No alarm symptoms present today.  Continue current medication.      Relevant Medications   citalopram (CELEXA) 20 MG tablet   Rash    Dermatitis present.  Mometasone provided.  If symptoms fail to improve follow-up.      Relevant Medications   mometasone  (ELOCON) 0.1 % cream   Encounter for annual physical exam - Primary    CPE today with normal findings. Labs pending. Health maintenance reviewed.  Several missing factors are present we will work to catch her up on these in the coming months. Anticipatory guidance provided. Follow-up in the next few months to review health maintenance activities.      Other Visit Diagnoses     Healthcare maintenance       Relevant Medications   rosuvastatin (CRESTOR) 5 MG tablet   Other Relevant Orders   MM Digital Screening   CBC With Diff/Platelet (Completed)   Comprehensive metabolic panel (Completed)   Iron, TIBC and Ferritin Panel (Completed)   Hemoglobin A1c (Completed)   Lipid panel (Completed)   Thyroid Panel With TSH (Completed)   VITAMIN D 25 Hydroxy (Vit-D Deficiency, Fractures) (Completed)   Need for diphtheria-tetanus-pertussis (Tdap) vaccine       Relevant Orders   Tdap vaccine greater than or equal to 7yo IM (Completed)   Flu vaccine need       Relevant Orders   Flu Vaccine QUAD 6+ mos PF IM (Fluarix Quad PF) (Completed)          Follow up plan: Return in about 1 year (around 04/18/2023) for CPE 1 year.  NEXT PREVENTATIVE PHYSICAL DUE IN 1 YEAR.  PATIENT COUNSELING PROVIDED FOR ALL ADULT PATIENTS:  Consume a well balanced diet low in saturated fats, cholesterol, and moderation in carbohydrates.   This can be as simple as monitoring portion sizes and cutting back on sugary beverages such as soda and juice to start with.    Daily water consumption of at least 64 ounces.  Physical activity at least 180 minutes per week, if just starting out.   This can be as simple as taking the stairs instead of the elevator and walking 2-3 laps around the office  purposefully every day.   STD protection, partner selection, and regular testing if high risk.  Limited consumption of alcoholic beverages if alcohol is consumed.  For women, I recommend no more than 7 alcoholic beverages per  week, spread out throughout the week.  Avoid "binge" drinking or consuming large quantities of alcohol in one setting.   Please let me know if you feel you may need help with reduction or quitting alcohol consumption.   Avoidance of nicotine, if  used.  Please let me know if you feel you may need help with reduction or quitting nicotine use.   Daily mental health attention.  This can be in the form of 5 minute daily meditation, prayer, journaling, yoga, reflection, etc.   Purposeful attention to your emotions and mental state can significantly improve your overall wellbeing and Health.  Please know that I am here to help you with all of your health care goals and am happy to work with you to find a solution that works best for you.  The greatest advice I have received with any changes in life are to take it one step at a time, that even means if all you can focus on is the next 60 seconds, then do that and celebrate your victories.  With any changes in life, you will have set backs, and that is OK. The important thing to remember is, if you have a set back, it is not a failure, it is an opportunity to try again!  Health Maintenance Recommendations Screening Testing Mammogram Every 1 -2 years based on history and risk factors Starting at age 60 Pap Smear Ages 21-39 every 3 years Ages 56-65 every 5 years with HPV testing More frequent testing may be required based on results and history Colon Cancer Screening Every 1-10 years based on test performed, risk factors, and history Starting at age 48 Bone Density Screening Every 2-10 years based on history Starting at age 5 for women Recommendations for men differ based on medication usage, history, and risk factors AAA Screening One time ultrasound Men 62-58 years old who have every smoked Lung Cancer Screening Low Dose Lung CT every 12 months Age 55-80 years with a 30 pack-year smoking history who still smoke or who have quit within the  last 15 years  Screening Labs Routine  Labs: Complete Blood Count (CBC), Complete Metabolic Panel (CMP), Cholesterol (Lipid Panel) Every 6-12 months based on history and medications May be recommended more frequently based on current conditions or previous results Hemoglobin A1c Lab Every 3-12 months based on history and previous results Starting at age 14 or earlier with diagnosis of diabetes, high cholesterol, BMI >26, and/or risk factors Frequent monitoring for patients with diabetes to ensure blood sugar control Thyroid Panel (TSH w/ T3 & T4) Every 6 months based on history, symptoms, and risk factors May be repeated more often if on medication HIV One time testing for all patients 57 and older May be repeated more frequently for patients with increased risk factors or exposure Hepatitis C One time testing for all patients 35 and older May be repeated more frequently for patients with increased risk factors or exposure Gonorrhea, Chlamydia Every 12 months for all sexually active persons 13-24 years Additional monitoring may be recommended for those who are considered high risk or who have symptoms PSA Men 16-49 years old with risk factors Additional screening may be recommended from age 54-69 based on risk factors, symptoms, and history  Vaccine Recommendations Tetanus Booster All adults every 10 years Flu Vaccine All patients 6 months and older every year COVID Vaccine All patients 12 years and older Initial dosing with booster May recommend additional booster based on age and health history HPV Vaccine 2 doses all patients age 81-26 Dosing may be considered for patients over 26 Shingles Vaccine (Shingrix) 2 doses all adults 64 years and older Pneumonia (Pneumovax 23) All adults 81 years and older May recommend earlier dosing based on health history Pneumonia (Prevnar 35)  All adults 17 years and older Dosed 1 year after Pneumovax 23  Additional Screening, Testing,  and Vaccinations may be recommended on an individualized basis based on family history, health history, risk factors, and/or exposure.

## 2022-04-17 NOTE — Patient Instructions (Addendum)
It was a pleasure seeing you today. I hope your time spent with [place] was pleasant and helpful. Please let us know if there is anything we can do to improve the service you receive.    Dei Glenard Haring is the therapist in [place]. She is amazing and works great with teenage girls!   You look amazing! I will send in the clearance once we get the labs back.   If you want to stop the eliquis you can do this. I would start aspirin daily.     Important Office Information Lab Results If labs were ordered, please note that you will see results through [place] as soon as they come available from [place].  It takes up to 5 business days for the results to be routed to me and for me to review them once all of the lab results have come through from [facility]. I will make recommendations based on your results and send these through [place] or someone from the office will call you to discuss. If your labs are abnormal, we may contact you to schedule a visit to discuss the results and make recommendations.  If you have not heard from [place] within 5 business days or you have waited longer than a week and your lab results have not come through on [place], please feel free to call the office or send a message through [place] to follow-up on these labs.   Referrals If referrals were placed today, the office where the referral was sent will contact you either by phone or through [place] to set up scheduling. Please note that it can take up to a week for the referral office to contact you. If you do not hear from them in a week, please contact the referral office directly to inquire about scheduling.   Condition Treated If your condition worsens or you begin to have new symptoms, please schedule a follow-up appointment for further evaluation. If you are not sure if an appointment is needed, you may call the office to leave a message for the nurse and someone will contact you with recommendations.  If you have an urgent or  life threatening emergency, please do not call the office, but seek emergency evaluation by calling 911 or going to the nearest emergency room for evaluation.   MyChart and Phone Calls Please do not use MyChart for urgent messages. It may take up to 3 business days for MyChart messages to be read by staff and if they are unable to handle the request, an additional 3 business days for them to be routed to me and for my response.  Messages sent to the provider through [place] do not come directly to the provider, please allow time for these messages to be routed and for me to respond.  We get a large volume of MyChart messages daily and these are responded to in the order received.   For urgent messages, please call the office at [phone] and speak with the front office staff or leave a message on the line of my assistant for guidance.  We are seeing patients from the hours of 8:00 am through 5:00 pm and calls directly to the nurse may not be answered immediately due to seeing patients, but your call will be returned as soon as possible.  Phone  messages received after 4:00 PM Monday through Thursday may not be returned until the following business day. Phone messages received after 11:00 AM on Friday may not be returned until Monday.  After Hours We share on call hours with providers from other offices. If you have an urgent need after hours that cannot wait until the next business day, please contact the on call provider by calling the office number. A nurse will speak with you and contact the provider if needed for recommendations.  If you have an urgent or life threatening emergency after hours, please do not call the on call provider, but seek emergency evaluation by calling 911 or going to the nearest emergency room for evaluation.   Paperwork All paperwork requires a minimum of 5 days to complete and return to you or the designated personnel. Please keep this in mind when bringing in forms  or sending requests for paperwork completion to the office.

## 2022-04-18 ENCOUNTER — Other Ambulatory Visit (HOSPITAL_BASED_OUTPATIENT_CLINIC_OR_DEPARTMENT_OTHER): Payer: Self-pay

## 2022-04-18 LAB — COMPREHENSIVE METABOLIC PANEL
ALT: 10 IU/L (ref 0–32)
AST: 13 IU/L (ref 0–40)
Albumin/Globulin Ratio: 1.5 (ref 1.2–2.2)
Albumin: 4.4 g/dL (ref 3.8–4.9)
Alkaline Phosphatase: 92 IU/L (ref 44–121)
BUN/Creatinine Ratio: 18 (ref 9–23)
BUN: 14 mg/dL (ref 6–24)
Bilirubin Total: 0.2 mg/dL (ref 0.0–1.2)
CO2: 24 mmol/L (ref 20–29)
Calcium: 10.2 mg/dL (ref 8.7–10.2)
Chloride: 101 mmol/L (ref 96–106)
Creatinine, Ser: 0.8 mg/dL (ref 0.57–1.00)
Globulin, Total: 2.9 g/dL (ref 1.5–4.5)
Glucose: 90 mg/dL (ref 70–99)
Potassium: 5.2 mmol/L (ref 3.5–5.2)
Sodium: 139 mmol/L (ref 134–144)
Total Protein: 7.3 g/dL (ref 6.0–8.5)
eGFR: 87 mL/min/{1.73_m2} (ref >59)

## 2022-04-18 LAB — IRON,TIBC AND FERRITIN PANEL
Ferritin: 16 ng/mL (ref 15–150)
Iron Saturation: 11 % — ABNORMAL LOW (ref 15–55)
Iron: 40 ug/dL (ref 27–159)
Total Iron Binding Capacity: 370 ug/dL (ref 250–450)
UIBC: 330 ug/dL (ref 131–425)

## 2022-04-18 LAB — CBC WITH DIFF/PLATELET
Basophils Absolute: 0 10*3/uL (ref 0.0–0.2)
Basos: 1 %
EOS (ABSOLUTE): 0.1 10*3/uL (ref 0.0–0.4)
Eos: 2 %
Hematocrit: 40.7 % (ref 34.0–46.6)
Hemoglobin: 13 g/dL (ref 11.1–15.9)
Immature Grans (Abs): 0 10*3/uL (ref 0.0–0.1)
Immature Granulocytes: 0 %
Lymphocytes Absolute: 1.9 10*3/uL (ref 0.7–3.1)
Lymphs: 40 %
MCH: 24.1 pg — ABNORMAL LOW (ref 26.6–33.0)
MCHC: 31.9 g/dL (ref 31.5–35.7)
MCV: 75 fL — ABNORMAL LOW (ref 79–97)
Monocytes Absolute: 0.5 10*3/uL (ref 0.1–0.9)
Monocytes: 10 %
Neutrophils Absolute: 2.2 10*3/uL (ref 1.4–7.0)
Neutrophils: 47 %
Platelets: 372 10*3/uL (ref 150–450)
RBC: 5.4 x10E6/uL — ABNORMAL HIGH (ref 3.77–5.28)
RDW: 17.3 % — ABNORMAL HIGH (ref 11.7–15.4)
WBC: 4.6 10*3/uL (ref 3.4–10.8)

## 2022-04-18 LAB — VITAMIN D 25 HYDROXY (VIT D DEFICIENCY, FRACTURES): Vit D, 25-Hydroxy: 30.9 ng/mL (ref 30.0–100.0)

## 2022-04-18 LAB — THYROID PANEL WITH TSH
Free Thyroxine Index: 2.4 (ref 1.2–4.9)
T3 Uptake Ratio: 26 % (ref 24–39)
T4, Total: 9.3 ug/dL (ref 4.5–12.0)
TSH: 1.8 u[IU]/mL (ref 0.450–4.500)

## 2022-04-18 LAB — LIPID PANEL
Chol/HDL Ratio: 3.8 ratio (ref 0.0–4.4)
Cholesterol, Total: 260 mg/dL — ABNORMAL HIGH (ref 100–199)
HDL: 69 mg/dL (ref >39)
LDL Chol Calc (NIH): 168 mg/dL — ABNORMAL HIGH (ref 0–99)
Triglycerides: 129 mg/dL (ref 0–149)
VLDL Cholesterol Cal: 23 mg/dL (ref 5–40)

## 2022-04-18 LAB — HEMOGLOBIN A1C
Est. average glucose Bld gHb Est-mCnc: 140 mg/dL
Hgb A1c MFr Bld: 6.5 % — ABNORMAL HIGH (ref 4.8–5.6)

## 2022-04-21 ENCOUNTER — Telehealth: Payer: Self-pay | Admitting: *Deleted

## 2022-04-21 NOTE — Telephone Encounter (Addendum)
Dr.Mansouraty,  Please review this patient's chart. She was scheduled as direct colonoscopy with you at [facility] on 05/02/2022. She has h/o perforated sigmoid colon, colostomy. Per pt she no longer on blood thinners. I just wanted to make you aware. Okay to proceed as scheduled? Please advise. Thank you,Neeva Trew PV  Please also see referral for colonoscopy.

## 2022-04-21 NOTE — Telephone Encounter (Signed)
RHM, Thanks for reaching out. I see no contraindication to the patient moving forward with the planned direct colonoscopy later this month. I am placing her colorectal surgeon on here as well to see if there is any contraindication from his standpoint.  SG, I see that patient needs colostomy reversal and needs an updated colonoscopy.  I believe she is already scheduled in a couple of weeks I am not sure when you were planning to do her colostomy reversal.  Do have any contraindication for me moving forward with our already scheduled colonoscopy later this month? Thanks. GM

## 2022-05-01 ENCOUNTER — Ambulatory Visit (AMBULATORY_SURGERY_CENTER): Payer: Self-pay

## 2022-05-01 VITALS — Ht 60.0 in | Wt 150.0 lb

## 2022-05-01 DIAGNOSIS — Z1211 Encounter for screening for malignant neoplasm of colon: Secondary | ICD-10-CM

## 2022-05-01 MED ORDER — NA SULFATE-K SULFATE-MG SULF 17.5-3.13-1.6 GM/177ML PO SOLN: 1.0000 | Freq: Once | ORAL | 0 refills | Status: AC

## 2022-05-01 NOTE — Progress Notes (Signed)
No egg or soy allergy known to patient  No issues known to pt with past sedation with any surgeries or procedures Patient denies ever being told they had issues or difficulty with intubation  No FH of Malignant Hyperthermia Pt is not on diet pills Pt is not on  home 02  Pt is not on blood thinners  Pt denies issues with constipation  No A fib or A flutter Have any cardiac testing pending--no Pt instructed to use Singlecare.com or GoodRx for a price reduction on prep   

## 2022-05-02 ENCOUNTER — Encounter: Payer: BC Managed Care – PPO | Admitting: Gastroenterology

## 2022-05-05 ENCOUNTER — Ambulatory Visit: Payer: Self-pay | Admitting: Surgery

## 2022-05-05 DIAGNOSIS — Z6830 Body mass index (BMI) 30.0-30.9, adult: Secondary | ICD-10-CM | POA: Diagnosis not present

## 2022-05-05 DIAGNOSIS — Z9049 Acquired absence of other specified parts of digestive tract: Secondary | ICD-10-CM | POA: Diagnosis not present

## 2022-05-05 DIAGNOSIS — K631 Perforation of intestine (nontraumatic): Secondary | ICD-10-CM | POA: Diagnosis not present

## 2022-05-05 DIAGNOSIS — Z933 Colostomy status: Secondary | ICD-10-CM | POA: Diagnosis not present

## 2022-05-07 ENCOUNTER — Other Ambulatory Visit: Payer: Self-pay | Admitting: Urology

## 2022-05-07 DIAGNOSIS — Z0000 Encounter for general adult medical examination without abnormal findings: Secondary | ICD-10-CM | POA: Insufficient documentation

## 2022-05-07 DIAGNOSIS — R21 Rash and other nonspecific skin eruption: Secondary | ICD-10-CM | POA: Insufficient documentation

## 2022-05-07 DIAGNOSIS — F339 Major depressive disorder, recurrent, unspecified: Secondary | ICD-10-CM | POA: Insufficient documentation

## 2022-05-07 DIAGNOSIS — F900 Attention-deficit hyperactivity disorder, predominantly inattentive type: Secondary | ICD-10-CM | POA: Insufficient documentation

## 2022-05-07 NOTE — Assessment & Plan Note (Signed)
Chronic.  Currently diet controlled.  Labs pending.  No alarm symptoms present.  Patient is due for eye exam and foot exam next visit.  We will also need urine microalbumin.

## 2022-05-07 NOTE — Assessment & Plan Note (Signed)
Healing well.  No alarm symptoms present at this time.  Will defer colonoscopy to the judgment of GI.

## 2022-05-07 NOTE — Assessment & Plan Note (Signed)
Chronic.  Labs pending.

## 2022-05-07 NOTE — Assessment & Plan Note (Signed)
Chronic. Labs today.  

## 2022-05-07 NOTE — Assessment & Plan Note (Signed)
Chronic.  Well-controlled with Celexa and as needed clonazepam.  Using minimally as instructed.  No alarm symptoms present today.

## 2022-05-07 NOTE — Assessment & Plan Note (Signed)
CPE today with normal findings. Labs pending. Health maintenance reviewed.  Several missing factors are present we will work to catch her up on these in the coming months. Anticipatory guidance provided. Follow-up in the next few months to review health maintenance activities.

## 2022-05-07 NOTE — Assessment & Plan Note (Signed)
Well-controlled with Celexa.  No alarm symptoms present today.  Continue current medication.

## 2022-05-07 NOTE — Assessment & Plan Note (Signed)
Dermatitis present.  Mometasone provided.  If symptoms fail to improve follow-up.

## 2022-05-07 NOTE — Assessment & Plan Note (Signed)
Chronic.  Well-controlled on Adderall 30 mg.  PMP reviewed today.  No changes.

## 2022-05-09 ENCOUNTER — Other Ambulatory Visit (HOSPITAL_BASED_OUTPATIENT_CLINIC_OR_DEPARTMENT_OTHER): Payer: Self-pay

## 2022-05-12 DIAGNOSIS — Z79899 Other long term (current) drug therapy: Secondary | ICD-10-CM | POA: Diagnosis not present

## 2022-05-12 DIAGNOSIS — F4323 Adjustment disorder with mixed anxiety and depressed mood: Secondary | ICD-10-CM | POA: Diagnosis not present

## 2022-05-12 DIAGNOSIS — F902 Attention-deficit hyperactivity disorder, combined type: Secondary | ICD-10-CM | POA: Diagnosis not present

## 2022-05-14 DIAGNOSIS — Z1231 Encounter for screening mammogram for malignant neoplasm of breast: Secondary | ICD-10-CM | POA: Diagnosis not present

## 2022-05-16 ENCOUNTER — Other Ambulatory Visit (HOSPITAL_BASED_OUTPATIENT_CLINIC_OR_DEPARTMENT_OTHER): Payer: Self-pay

## 2022-05-19 ENCOUNTER — Encounter: Payer: Self-pay | Admitting: Gastroenterology

## 2022-05-28 ENCOUNTER — Ambulatory Visit (AMBULATORY_SURGERY_CENTER): Payer: BC Managed Care – PPO | Admitting: Gastroenterology

## 2022-05-28 ENCOUNTER — Encounter: Payer: Self-pay | Admitting: Gastroenterology

## 2022-05-28 VITALS — BP 119/77 | HR 74 | Temp 98.2°F | Resp 14 | Ht 60.0 in | Wt 155.0 lb

## 2022-05-28 DIAGNOSIS — Z1211 Encounter for screening for malignant neoplasm of colon: Secondary | ICD-10-CM

## 2022-05-28 MED ORDER — SODIUM CHLORIDE 0.9 % IV SOLN: 500.0000 mL | Freq: Once | INTRAVENOUS | Status: DC

## 2022-05-28 NOTE — Patient Instructions (Signed)
Read the handouts given to you by your recovery room nurse.  Use fiber con 1-2 tablets daily per Dr. Jerilynn Mages.  Continue your current medications.   YOU HAD AN ENDOSCOPIC PROCEDURE TODAY AT [facility]:   Refer to the procedure report that was given to you for any specific questions about what was found during the examination.  If the procedure report does not answer your questions, please call your gastroenterologist to clarify.  If you requested that your care partner not be given the details of your procedure findings, then the procedure report has been included in a sealed envelope for you to review at your convenience later.  YOU SHOULD EXPECT: Some feelings of bloating in the abdomen. Passage of more gas than usual.  Walking can help get rid of the air that was put into your GI tract during the procedure and reduce the bloating. If you had a lower endoscopy (such as a colonoscopy or flexible sigmoidoscopy) you may notice spotting of blood in your stool or on the toilet paper. If you underwent a bowel prep for your procedure, you may not have a normal bowel movement for a few days.  Please Note:  You might notice some irritation and congestion in your nose or some drainage.  This is from the oxygen used during your procedure.  There is no need for concern and it should clear up in a day or so.  SYMPTOMS TO REPORT IMMEDIATELY:  Following lower endoscopy (colonoscopy or flexible sigmoidoscopy):  Excessive amounts of blood in the stool  Significant tenderness or worsening of abdominal pains  Swelling of the abdomen that is new, acute  Fever of 100F or higher   For urgent or emergent issues, a gastroenterologist can be reached at any hour by calling [phone]. Do not use MyChart messaging for urgent concerns.    DIET:  We do recommend a small meal at first, but then you may proceed to your regular diet.  Drink plenty of fluids but you should avoid alcoholic beverages for 24  hours.  ACTIVITY:  You should plan to take it easy for the rest of today and you should NOT DRIVE or use heavy machinery until tomorrow (because of the sedation medicines used during the test).    FOLLOW UP: Our staff will call the number listed on your records the next business day following your procedure.  We will call around 7:15- 8:00 am to check on you and address any questions or concerns that you may have regarding the information given to you following your procedure. If we do not reach you, we will leave a message.      SIGNATURES/CONFIDENTIALITY: You and/or your care partner have signed paperwork which will be entered into your electronic medical record.  These signatures attest to the fact that that the information above on your After Visit Summary has been reviewed and is understood.  Full responsibility of the confidentiality of this discharge information lies with you and/or your care-partner.

## 2022-05-28 NOTE — Progress Notes (Signed)
GASTROENTEROLOGY PROCEDURE H&P NOTE   Primary Care Physician: Orma Render, NP  HPI: Michele [name] is a 56 y.o. female who presents for Colonoscopy for screening through ostomy as well as the rectal pouch.  Past Medical History:  Diagnosis Date   Allergy    Anemia    Anxiety    Depression    Hyperlipidemia    Hypertension    with pregnancies   Past Surgical History:  Procedure Laterality Date   CESAREAN SECTION     TWO of them   COLOSTOMY Left 11/05/2021   Procedure: COLOSTOMY;  Surgeon: Sanjuana Kava, MD;  Location: [facility];  Service: Gynecology;  Laterality: Left;   CYSTOSCOPY  10/25/2021   Procedure: CYSTOSCOPY;  Surgeon: Sanjuana Kava, MD;  Location: [facility];  Service: Gynecology;;   dental implants N/A 2016   lower 2 teeth   HYSTERECTOMY ABDOMINAL WITH SALPINGECTOMY  10/25/2021   Procedure: HYSTERECTOMY ABDOMINAL WITH BILATERAL SALPINGECTOMY;  Surgeon: Sanjuana Kava, MD;  Location: [facility];  Service: Gynecology;;   HYSTEROSCOPY WITH D & C  01/30/2017   Procedure: DILATATION AND CURETTAGE /HYSTEROSCOPY;  Surgeon: Sanjuana Kava, MD;  Location: [facility];  Service: Gynecology;;   IR CATHETER TUBE CHANGE  12/02/2021   IR GUIDED DRAIN W CATHETER PLACEMENT  12/02/2021   IR RADIOLOGIST EVAL & MGMT  12/17/2021   IR SINUS/FIST TUBE CHK-NON GI  12/02/2021   LAPAROSCOPY  10/25/2021   Procedure: LAPAROSCOPY DIAGNOSTIC;  Surgeon: Sanjuana Kava, MD;  Location: [facility];  Service: Gynecology;;   LAPAROTOMY N/A 11/05/2021   Procedure: EXPLORATORY LAPAROTOMY AND COLECTOMY ;  Surgeon: Sanjuana Kava, MD;  Location: [facility];  Service: Gynecology;  Laterality: N/A;   LAPAROTOMY N/A 11/14/2021   Procedure: EXPLORATORY  LAPAROTOMY AND ABDOMINAL WALL CLOSURE;  Surgeon: Erroll Luna, MD;  Location: [facility];  Service: General;  Laterality: N/A;   Current Outpatient Medications  Medication Sig Dispense Refill   amphetamine-dextroamphetamine (ADDERALL XR) 30 MG 24 hr capsule Take 1 capsule (30 mg total) by mouth daily.  30 capsule 0   citalopram (CELEXA) 20 MG tablet Take 1 tablet (20 mg total) by mouth daily. 90 tablet 3   clonazePAM (KLONOPIN) 1 MG tablet Take 0.5-1 tablets (0.5-1 mg total) by mouth 2 (two) times daily as needed (severe panic symptoms). 60 tablet 2   polyethylene glycol (MIRALAX / GLYCOLAX) 17 g packet Take 17 g by mouth 2 (two) times daily as needed. 14 each 0   rosuvastatin (CRESTOR) 5 MG tablet Take 1 tablet (5 mg total) by mouth daily. 90 tablet 3   ascorbic acid (VITAMIN C) 500 MG tablet Take 1 tablet (500 mg total) by mouth 2 (two) times daily. 60 tablet 0   ferrous sulfate 325 (65 FE) MG tablet Take 1 tablet (325 mg total) by mouth daily with breakfast. 30 tablet 0   mometasone (ELOCON) 0.1 % cream Apply 1 Application topically as needed for itching. 45 g 2   VITAMIN D, CHOLECALCIFEROL, PO Take by mouth.     Current Facility-Administered Medications  Medication Dose Route Frequency Provider Last Rate Last Admin   0.9 %  sodium chloride infusion  500 mL Intravenous Once Mansouraty, Telford Nab., MD        Current Outpatient Medications:    amphetamine-dextroamphetamine (ADDERALL XR) 30 MG 24 hr capsule, Take 1 capsule (30 mg total) by mouth daily., Disp: 30 capsule, Rfl: 0   citalopram (CELEXA) 20 MG tablet, Take 1 tablet (20 mg total) by  mouth daily., Disp: 90 tablet, Rfl: 3   clonazePAM (KLONOPIN) 1 MG tablet, Take 0.5-1 tablets (0.5-1 mg total) by mouth 2 (two) times daily as needed (severe panic symptoms)., Disp: 60 tablet, Rfl: 2   polyethylene glycol (MIRALAX / GLYCOLAX) 17 g packet, Take 17 g by mouth 2 (two) times daily as needed., Disp: 14 each, Rfl: 0   rosuvastatin (CRESTOR) 5 MG tablet, Take 1 tablet (5 mg total) by mouth daily., Disp: 90 tablet, Rfl: 3   ascorbic acid (VITAMIN C) 500 MG tablet, Take 1 tablet (500 mg total) by mouth 2 (two) times daily., Disp: 60 tablet, Rfl: 0   ferrous sulfate 325 (65 FE) MG tablet, Take 1 tablet (325 mg total) by mouth daily with  breakfast., Disp: 30 tablet, Rfl: 0   mometasone (ELOCON) 0.1 % cream, Apply 1 Application topically as needed for itching., Disp: 45 g, Rfl: 2   VITAMIN D, CHOLECALCIFEROL, PO, Take by mouth., Disp: , Rfl:   Current Facility-Administered Medications:    0.9 %  sodium chloride infusion, 500 mL, Intravenous, Once, Mansouraty, Telford Nab., MD Allergies  Allergen Reactions   Atorvastatin Other (See Comments) and Rash    Muscle aches on Lipitor 20 mg and 40 mg  Muscle aches on Lipitor 20 mg and 40 mg  Muscle aches on Lipitor 20 mg and 40 mg  Muscle aches on Lipitor 20 mg and 40 mg  Muscle aches on Lipitor 20 mg and 40 mg    Sulfa Antibiotics Nausea And Vomiting, Other (See Comments) and Rash   Family History  Problem Relation Age of Onset   Colon polyps Mother    Heart attack Father    Hypertension Sister    Colon cancer Neg Hx    Esophageal cancer Neg Hx    Rectal cancer Neg Hx    Stomach cancer Neg Hx    Social History   Socioeconomic History   Marital status: Divorced    Spouse name: Not on file   Number of children: Not on file   Years of education: Not on file   Highest education level: Not on file  Occupational History   Not on file  Tobacco Use   Smoking status: Former    Packs/day: 0.50    Years: 15.00    Total pack years: 7.50    Types: Cigarettes    Quit date: 2003    Years since quitting: 20.8   Smokeless tobacco: Never   Tobacco comments:    quit 2003  Vaping Use   Vaping Use: Never used  Substance and Sexual Activity   Alcohol use: No   Drug use: No   Sexual activity: Yes    Birth control/protection: Surgical    Comment: 2006  Other Topics Concern   Not on file  Social History Narrative   The pt is a homemaker -pt quit smoking cigarettes 2004    Social Determinants of Health   Financial Resource Strain: Not on file  Food Insecurity: Not on file  Transportation Needs: Not on file  Physical Activity: Not on file  Stress: Not on file  Social  Connections: Not on file  Intimate Partner Violence: Not on file    Physical Exam: Today's Vitals   05/28/22 1405  BP: 114/69  Pulse: 76  Temp: 98.2 F (36.8 C)  SpO2: 98%  Weight: 155 lb (70.3 kg)  Height: 5' (1.524 m)   Body mass index is 30.27 kg/m. GEN: NAD EYE: Sclerae anicteric ENT: MMM  CV: Non-tachycardic GI: Soft, NT/ND NEURO:  Alert & Oriented x 3  Lab Results: No results for input(s): "WBC", "HGB", "HCT", "PLT" in the last 72 hours. BMET No results for input(s): "NA", "K", "CL", "CO2", "GLUCOSE", "BUN", "CREATININE", "CALCIUM" in the last 72 hours. LFT No results for input(s): "PROT", "ALBUMIN", "AST", "ALT", "ALKPHOS", "BILITOT", "BILIDIR", "IBILI" in the last 72 hours. PT/INR No results for input(s): "LABPROT", "INR" in the last 72 hours.   Impression / Plan: This is a 56 y.o.female who presents for Colonoscopy for screening through ostomy as well as the rectal pouch.  The risks and benefits of endoscopic evaluation/treatment were discussed with the patient and/or family; these include but are not limited to the risk of perforation, infection, bleeding, missed lesions, lack of diagnosis, severe illness requiring hospitalization, as well as anesthesia and sedation related illnesses.  The patient's history has been reviewed, patient examined, no change in status, and deemed stable for procedure.  The patient and/or family is agreeable to proceed.    Justice Britain, MD [facility] Advanced [facility]oscopy Office # [phone]

## 2022-05-28 NOTE — Progress Notes (Signed)
1542 - colonoscopy exam continues per rectal approach per Dr. Rush Landmark. Pt turned Left lateral decubitus. VSS. Airway intact. Colostomy exam end time 1540.

## 2022-05-28 NOTE — Progress Notes (Signed)
Pt resting comfortably. VSS. Airway intact. SBAR complete to RN. All questions answered.   

## 2022-05-28 NOTE — Op Note (Addendum)
[place] Patient Name: Michele [name] Procedure Date: 05/28/2022 2:54 PM MRN: [id number] Endoscopist: Justice Britain , MD Age: 56 Referring MD:  Date of Birth: 1966/04/16 Gender: Female Account #: [id number] Procedure:                Colonoscopy via Stoma with Endoscopy of Hartmann                            Pouch Indications:              History of sigmoid colectomy, Follow-up endoscopy                            after surgery, This is the patient's first                            colonoscopy, History of perforated sigmoid colon Medicines:                Monitored Anesthesia Care Procedure:                Pre-Anesthesia Assessment:                           - Prior to the procedure, a History and Physical                            was performed, and patient medications and                            allergies were reviewed. The patient's tolerance of                            previous anesthesia was also reviewed. The risks                            and benefits of the procedure and the sedation                            options and risks were discussed with the patient.                            All questions were answered, and informed consent                            was obtained. Prior Anticoagulants: The patient has                            taken no previous anticoagulant or antiplatelet                            agents. ASA Grade Assessment: III - A patient with                            severe systemic disease. After reviewing the risks  and benefits, the patient was deemed in                            satisfactory condition to undergo the procedure.                           After obtaining informed consent, the endoscope was                            passed under direct vision. Throughout the                            procedure, the patient's blood pressure, pulse, and                            oxygen  saturations were monitored continuously. The                            CF HQ190L #[id number] was introduced through the                            descending colostomy and advanced to the the cecum,                            identified by appendiceal orifice and ileocecal                            valve. After obtaining informed consent, the                            endoscope was passed under direct vision.                            Throughout the procedure, the patient's blood                            pressure, pulse, and oxygen saturations were                            monitored continuously.The procedure was performed                            without difficulty. The patient tolerated the                            procedure. The quality of the bowel preparation was                            good. The CF HQ190L #[id number] was introduced through                            the anus and advanced to the [facility] pouch. Scope In: 3:32:19 PM Scope Out: 3:45:23 PM Scope Withdrawal Time: 0 hours 11 minutes 1 second  Total Procedure Duration: 0  hours 13 minutes 4 seconds  Findings:                 Ostomy shows no evidence of any herniations and is                            well in appearance without concern upon digital                            examination.                           Normal mucosa was found in the descending colon, at                            the splenic flexure, in the transverse colon, at                            the hepatic flexure, in the ascending colon and in                            the cecum.                           We then transitioned to the rectal pouch evaluation.                           A few small-mouthed diverticula were found in the                            very proximal portion of the Hartmann pouch.                           Patchy areas of mildly friable mucosa with no                            bleeding was found in the rectum and in the                             recto-sigmoid colon?not convincing for diversion                            colitis.                           Non-bleeding non-thrombosed internal hemorrhoids                            were found during perianal exam, during digital                            exam and during endoscopy. The hemorrhoids were                            Grade I (internal hemorrhoids that do not prolapse).  Hemorrhoids were found on perianal exam. Complications:            No immediate complications. Estimated Blood Loss:     Estimated blood loss was minimal. Impression:               - Ostomy evaluation was normal.                           - Normal mucosa in the descending colon, at the                            splenic flexure, in the transverse colon, at the                            hepatic flexure, in the ascending colon and in the                            cecum.                           - A few diverticulosis noted in proximal portion of                            the Hartmann pouch.                           - Friability with no bleeding in the rectum and in                            the recto-sigmoid colon tissue.                           - Non-bleeding non-thrombosed internal hemorrhoids. Recommendation:           - The patient will be observed post-procedure,                            until all discharge criteria are met.                           - Discharge patient to home.                           - Patient has a contact number available for                            emergencies. The signs and symptoms of potential                            delayed complications were discussed with the                            patient. Return to normal activities tomorrow.                            Written discharge instructions were provided to the  patient.                           - High fiber diet.                            - Use FiberCon 1-2 tablets PO daily.                           - Continue current laxative therapy.                           - Repeat post-surgical lower GI endoscopy in 10                            years for screening purposes.                           - I currently see no contraindication to moving                            forward with Jeanette Caprice reversal when able from a                            colonic standpoint.                           - The findings and recommendations were discussed                            with the patient.                           - The findings and recommendations were discussed                            with the patient's family. Justice Britain, MD 05/28/2022 3:57:13 PM

## 2022-05-29 ENCOUNTER — Telehealth: Payer: Self-pay

## 2022-05-29 NOTE — Telephone Encounter (Signed)
  Follow up Call-     05/28/2022    2:05 PM  Call back number  Post procedure Call Back phone  # [phone]  Permission to leave phone message Yes     Patient questions:  Do you have a fever, pain , or abdominal swelling? No. Pain Score  0 *  Have you tolerated food without any problems? Yes.    Have you been able to return to your normal activities? Yes.    Do you have any questions about your discharge instructions: Diet   No. Medications  No. Follow up visit  No.  Do you have questions or concerns about your Care? No.  Actions: * If pain score is 4 or above: No action needed, pain <4.

## 2022-06-03 ENCOUNTER — Encounter: Payer: BC Managed Care – PPO | Admitting: Gastroenterology

## 2022-06-16 DIAGNOSIS — F4323 Adjustment disorder with mixed anxiety and depressed mood: Secondary | ICD-10-CM | POA: Diagnosis not present

## 2022-06-26 ENCOUNTER — Other Ambulatory Visit (HOSPITAL_COMMUNITY): Payer: BC Managed Care – PPO

## 2022-06-27 NOTE — Progress Notes (Signed)
Anesthesia Review:  PCP: Cardiologist : Chest x-ray : EKG : Echo : Stress test: Cardiac Cath :  Activity level:  Sleep Study/ CPAP : Fasting Blood Sugar :      / Checks Blood Sugar -- times a day:   Blood Thinner/ Instructions /Last Dose: ASA / Instructions/ Last Dose :  

## 2022-06-30 NOTE — Patient Instructions (Signed)
SURGICAL WAITING ROOM VISITATION Patients having surgery or a procedure may have no more than 2 support people in the waiting area - these visitors may rotate.   Children under the age of 59 must have an adult with them who is not the patient. If the patient needs to stay at the hospital during part of their recovery, the visitor guidelines for inpatient rooms apply. Pre-op nurse will coordinate an appropriate time for 1 support person to accompany patient in pre-op.  This support person may not rotate.    Please refer to the [facility] website for the visitor guidelines for Inpatients (after your surgery is over and you are in a regular room).       Your procedure is scheduled on:  07/09/22    Report to [facility] Main Entrance    Report to admitting at  [place] AM   Call this number if you have problems the morning of surgery [phone]             Clear liquid diet on day of bowel prep.     After Midnight you may have the following liquids until _ 0530_____ AM  DAY OF SURGERY  Water Non-Citrus Juices (without pulp, NO RED) Carbonated Beverages Black Coffee (NO MILK/CREAM OR CREAMERS, sugar ok)  Clear Tea (NO MILK/CREAM OR CREAMERS, sugar ok) regular and decaf                             Plain Jell-O (NO RED)                                           Fruit ices (not with fruit pulp, NO RED)                                     Popsicles (NO RED)                                                               Sports drinks like Gatorade (NO RED)              Drink 2 Ensure/G2 drinks AT 10:00 PM the night before surgery.        The day of surgery:  Drink ONE (1) Pre-Surgery Clear Ensure or G2 at   0530 AM ( have completed by )  the morning of surgery. Drink in one sitting. Do not sip.  This drink was given to you during your hospital  pre-op appointment visit. Nothing else to drink after completing the  Pre-Surgery Clear Ensure or G2.          If you have questions,  please contact your surgeon's office.   FOLLOW BOWEL PREP AND ANY ADDITIONAL PRE OP INSTRUCTIONS YOU RECEIVED FROM YOUR SURGEON'S OFFICE!!!     Oral Hygiene is also important to reduce your risk of infection.  Remember - BRUSH YOUR TEETH THE MORNING OF SURGERY WITH YOUR REGULAR TOOTHPASTE   Do NOT smoke after Midnight   Take these medicines the morning of surgery with A SIP OF WATER:  celexa  DO NOT TAKE ANY ORAL DIABETIC MEDICATIONS DAY OF YOUR SURGERY  Bring CPAP mask and tubing day of surgery.                              You may not have any metal on your body including hair pins, jewelry, and body piercing             Do not wear make-up, lotions, powders, perfumes/cologne, or deodorant  Do not wear nail polish including gel and S&S, artificial/acrylic nails, or any other type of covering on natural nails including finger and toenails. If you have artificial nails, gel coating, etc. that needs to be removed by a nail salon please have this removed prior to surgery or surgery may need to be canceled/ delayed if the surgeon/ anesthesia feels like they are unable to be safely monitored.   Do not shave  48 hours prior to surgery.               Men may shave face and neck.   Do not bring valuables to the hospital. [place].   Contacts, dentures or bridgework may not be worn into surgery.   Bring small overnight bag day of surgery.   DO NOT [place]. PHARMACY WILL DISPENSE MEDICATIONS LISTED ON YOUR MEDICATION LIST TO YOU DURING YOUR ADMISSION [place]!    Patients discharged on the day of surgery will not be allowed to drive home.  Someone NEEDS to stay with you for the first 24 hours after anesthesia.   Special Instructions: Bring a copy of your healthcare power of attorney and living will documents the day of surgery if you haven't scanned them  before.              Please read over the following fact sheets you were given: IF [place] [phone]   If you received a COVID test during your pre-op visit  it is requested that you wear a mask when out in public, stay away from anyone that may not be feeling well and notify your surgeon if you develop symptoms. If you test positive for Covid or have been in contact with anyone that has tested positive in the last 10 days please notify you surgeon.    [facility] - Preparing for Surgery Before surgery, you can play an important role.  Because skin is not sterile, your skin needs to be as free of germs as possible.  You can reduce the number of germs on your skin by washing with CHG (chlorahexidine gluconate) soap before surgery.  CHG is an antiseptic cleaner which kills germs and bonds with the skin to continue killing germs even after washing. Please DO NOT use if you have an allergy to CHG or antibacterial soaps.  If your skin becomes reddened/irritated stop using the CHG and inform your nurse when you arrive at Short Stay. Do not shave (including legs and underarms) for at least 48 hours prior to the first CHG shower.  You may shave your face/neck. Please follow  these instructions carefully:  1.  Shower with CHG Soap the night before surgery and the  morning of Surgery.  2.  If you choose to wash your hair, wash your hair first as usual with your  normal  shampoo.  3.  After you shampoo, rinse your hair and body thoroughly to remove the  shampoo.                           4.  Use CHG as you would any other liquid soap.  You can apply chg directly  to the skin and wash                       Gently with a scrungie or clean washcloth.  5.  Apply the CHG Soap to your body ONLY FROM THE NECK DOWN.   Do not use on face/ open                           Wound or open sores. Avoid contact with eyes, ears mouth and genitals (private parts).                        Wash face,  Genitals (private parts) with your normal soap.             6.  Wash thoroughly, paying special attention to the area where your surgery  will be performed.  7.  Thoroughly rinse your body with warm water from the neck down.  8.  DO NOT shower/wash with your normal soap after using and rinsing off  the CHG Soap.                9.  Pat yourself dry with a clean towel.            10.  Wear clean pajamas.            11.  Place clean sheets on your bed the night of your first shower and do not  sleep with pets. Day of Surgery : Do not apply any lotions/deodorants the morning of surgery.  Please wear clean clothes to the hospital/surgery center.  FAILURE TO FOLLOW THESE INSTRUCTIONS MAY RESULT IN THE CANCELLATION OF YOUR SURGERY PATIENT SIGNATURE_________________________________  NURSE SIGNATURE__________________________________  ________________________________________________________________________

## 2022-07-01 ENCOUNTER — Encounter (HOSPITAL_COMMUNITY)
Admission: RE | Admit: 2022-07-01 | Discharge: 2022-07-01 | Disposition: A | Discharge status: Home / Self Care | Payer: BC Managed Care – PPO | Source: Ambulatory Visit | Attending: Surgery | Admitting: Surgery

## 2022-07-01 ENCOUNTER — Encounter (HOSPITAL_COMMUNITY): Payer: Self-pay

## 2022-07-01 ENCOUNTER — Other Ambulatory Visit: Payer: Self-pay

## 2022-07-01 VITALS — BP 122/83 | HR 89 | Temp 99.0°F | Resp 16 | Ht 60.0 in | Wt 156.0 lb

## 2022-07-01 DIAGNOSIS — E119 Type 2 diabetes mellitus without complications: Secondary | ICD-10-CM | POA: Diagnosis not present

## 2022-07-01 DIAGNOSIS — Z01818 Encounter for other preprocedural examination: Secondary | ICD-10-CM | POA: Insufficient documentation

## 2022-07-01 HISTORY — DX: Prediabetes: R73.03

## 2022-07-01 LAB — HEMOGLOBIN A1C
Hgb A1c MFr Bld: 5.7 % — ABNORMAL HIGH (ref 4.8–5.6)
Mean Plasma Glucose: 116.89 mg/dL

## 2022-07-01 LAB — CBC
HCT: 39.9 % (ref 36.0–46.0)
Hemoglobin: 12.2 g/dL (ref 12.0–15.0)
MCH: 24.9 pg — ABNORMAL LOW (ref 26.0–34.0)
MCHC: 30.6 g/dL (ref 30.0–36.0)
MCV: 81.6 fL (ref 80.0–100.0)
Platelets: 321 10*3/uL (ref 150–400)
RBC: 4.89 MIL/uL (ref 3.87–5.11)
RDW: 16.9 % — ABNORMAL HIGH (ref 11.5–15.5)
WBC: 4.2 10*3/uL (ref 4.0–10.5)
nRBC: 0 % (ref 0.0–0.2)

## 2022-07-01 LAB — BASIC METABOLIC PANEL
Anion gap: 5 (ref 5–15)
BUN: 16 mg/dL (ref 6–20)
CO2: 27 mmol/L (ref 22–32)
Calcium: 9.1 mg/dL (ref 8.9–10.3)
Chloride: 107 mmol/L (ref 98–111)
Creatinine, Ser: 0.69 mg/dL (ref 0.44–1.00)
GFR, Estimated: >60 mL/min (ref >60)
Glucose, Bld: 99 mg/dL (ref 70–99)
Potassium: 4.4 mmol/L (ref 3.5–5.1)
Sodium: 139 mmol/L (ref 135–145)

## 2022-07-05 ENCOUNTER — Other Ambulatory Visit (HOSPITAL_BASED_OUTPATIENT_CLINIC_OR_DEPARTMENT_OTHER): Payer: Self-pay | Admitting: Nurse Practitioner

## 2022-07-05 DIAGNOSIS — F900 Attention-deficit hyperactivity disorder, predominantly inattentive type: Secondary | ICD-10-CM

## 2022-07-07 ENCOUNTER — Other Ambulatory Visit (HOSPITAL_BASED_OUTPATIENT_CLINIC_OR_DEPARTMENT_OTHER): Payer: Self-pay

## 2022-07-07 MED ORDER — AMPHETAMINE-DEXTROAMPHET ER 30 MG PO CP24
30.0000 mg | ORAL_CAPSULE | Freq: Every day | ORAL | 0 refills | Status: AC
  Filled 2022-07-07: qty 30, 30d supply, fill #0

## 2022-07-08 ENCOUNTER — Other Ambulatory Visit (HOSPITAL_BASED_OUTPATIENT_CLINIC_OR_DEPARTMENT_OTHER): Payer: Self-pay

## 2022-07-08 DIAGNOSIS — F4323 Adjustment disorder with mixed anxiety and depressed mood: Secondary | ICD-10-CM | POA: Diagnosis not present

## 2022-07-08 NOTE — Anesthesia Preprocedure Evaluation (Signed)
Anesthesia Evaluation  Patient identified by MRN, date of birth, ID band Patient awake    Reviewed: Allergy & Precautions, NPO status , Patient's Chart, lab work & pertinent test results  Airway Mallampati: II       Dental   Pulmonary neg pulmonary ROS, former smoker   breath sounds clear to auscultation       Cardiovascular negative cardio ROS  Rhythm:Regular Rate:Normal     Neuro/Psych  PSYCHIATRIC DISORDERS         GI/Hepatic Neg liver ROS,,,History noted Dr. Nyoka Cowden   Endo/Other  diabetes    Renal/GU negative Renal ROS     Musculoskeletal   Abdominal   Peds  Hematology   Anesthesia Other Findings   Reproductive/Obstetrics                             Anesthesia Physical Anesthesia Plan  ASA: 3  Anesthesia Plan: General   Post-op Pain Management: Tylenol PO (pre-op)*   Induction: Intravenous  PONV Risk Score and Plan: 3 and Ondansetron, Dexamethasone and Midazolam  Airway Management Planned: Oral ETT  Additional Equipment:   Intra-op Plan:   Post-operative Plan: Extubation in OR  Informed Consent: I have reviewed the patients History and Physical, chart, labs and discussed the procedure including the risks, benefits and alternatives for the proposed anesthesia with the patient or authorized representative who has indicated his/her understanding and acceptance.     Dental advisory given  Plan Discussed with: Anesthesiologist and CRNA  Anesthesia Plan Comments:        Anesthesia Quick Evaluation

## 2022-07-09 ENCOUNTER — Inpatient Hospital Stay (HOSPITAL_COMMUNITY)
Admission: RE | Admit: 2022-07-09 | Discharge: 2022-07-13 | DRG: 337 | Disposition: A | Discharge status: Home / Self Care | Payer: BC Managed Care – PPO | Source: Ambulatory Visit | Attending: Surgery | Admitting: Surgery

## 2022-07-09 ENCOUNTER — Other Ambulatory Visit: Payer: Self-pay

## 2022-07-09 ENCOUNTER — Encounter (HOSPITAL_COMMUNITY)
Admission: RE | Disposition: A | Discharge status: Home / Self Care | Payer: Self-pay | Source: Ambulatory Visit | Attending: Surgery

## 2022-07-09 ENCOUNTER — Inpatient Hospital Stay (HOSPITAL_COMMUNITY): Payer: BC Managed Care – PPO | Admitting: Physician Assistant

## 2022-07-09 ENCOUNTER — Inpatient Hospital Stay (HOSPITAL_COMMUNITY): Payer: BC Managed Care – PPO | Admitting: Certified Registered Nurse Anesthetist

## 2022-07-09 ENCOUNTER — Encounter (HOSPITAL_COMMUNITY): Payer: Self-pay | Admitting: Surgery

## 2022-07-09 DIAGNOSIS — Z90710 Acquired absence of both cervix and uterus: Secondary | ICD-10-CM | POA: Diagnosis not present

## 2022-07-09 DIAGNOSIS — Z83719 Family history of colon polyps, unspecified: Secondary | ICD-10-CM

## 2022-07-09 DIAGNOSIS — K660 Peritoneal adhesions (postprocedural) (postinfection): Secondary | ICD-10-CM | POA: Diagnosis present

## 2022-07-09 DIAGNOSIS — Z882 Allergy status to sulfonamides status: Secondary | ICD-10-CM | POA: Diagnosis not present

## 2022-07-09 DIAGNOSIS — F900 Attention-deficit hyperactivity disorder, predominantly inattentive type: Secondary | ICD-10-CM | POA: Diagnosis present

## 2022-07-09 DIAGNOSIS — E119 Type 2 diabetes mellitus without complications: Secondary | ICD-10-CM | POA: Diagnosis not present

## 2022-07-09 DIAGNOSIS — D649 Anemia, unspecified: Secondary | ICD-10-CM | POA: Diagnosis present

## 2022-07-09 DIAGNOSIS — E785 Hyperlipidemia, unspecified: Secondary | ICD-10-CM | POA: Diagnosis present

## 2022-07-09 DIAGNOSIS — K5909 Other constipation: Secondary | ICD-10-CM | POA: Diagnosis present

## 2022-07-09 DIAGNOSIS — Z6832 Body mass index (BMI) 32.0-32.9, adult: Secondary | ICD-10-CM | POA: Diagnosis not present

## 2022-07-09 DIAGNOSIS — E559 Vitamin D deficiency, unspecified: Secondary | ICD-10-CM | POA: Diagnosis not present

## 2022-07-09 DIAGNOSIS — Z01818 Encounter for other preprocedural examination: Secondary | ICD-10-CM

## 2022-07-09 DIAGNOSIS — K432 Incisional hernia without obstruction or gangrene: Secondary | ICD-10-CM | POA: Diagnosis not present

## 2022-07-09 DIAGNOSIS — Z7901 Long term (current) use of anticoagulants: Secondary | ICD-10-CM

## 2022-07-09 DIAGNOSIS — M6208 Separation of muscle (nontraumatic), other site: Secondary | ICD-10-CM | POA: Diagnosis present

## 2022-07-09 DIAGNOSIS — K6389 Other specified diseases of intestine: Secondary | ICD-10-CM | POA: Diagnosis not present

## 2022-07-09 DIAGNOSIS — E669 Obesity, unspecified: Secondary | ICD-10-CM | POA: Diagnosis not present

## 2022-07-09 DIAGNOSIS — F410 Panic disorder [episodic paroxysmal anxiety] without agoraphobia: Secondary | ICD-10-CM | POA: Diagnosis not present

## 2022-07-09 DIAGNOSIS — F32A Depression, unspecified: Secondary | ICD-10-CM | POA: Diagnosis present

## 2022-07-09 DIAGNOSIS — I10 Essential (primary) hypertension: Secondary | ICD-10-CM | POA: Diagnosis not present

## 2022-07-09 DIAGNOSIS — Z803 Family history of malignant neoplasm of breast: Secondary | ICD-10-CM

## 2022-07-09 DIAGNOSIS — Z433 Encounter for attention to colostomy: Principal | ICD-10-CM

## 2022-07-09 DIAGNOSIS — K631 Perforation of intestine (nontraumatic): Secondary | ICD-10-CM | POA: Diagnosis present

## 2022-07-09 DIAGNOSIS — Z8249 Family history of ischemic heart disease and other diseases of the circulatory system: Secondary | ICD-10-CM | POA: Diagnosis not present

## 2022-07-09 DIAGNOSIS — I82419 Acute embolism and thrombosis of unspecified femoral vein: Secondary | ICD-10-CM | POA: Diagnosis present

## 2022-07-09 DIAGNOSIS — Z90722 Acquired absence of ovaries, bilateral: Secondary | ICD-10-CM | POA: Diagnosis not present

## 2022-07-09 DIAGNOSIS — Z98891 History of uterine scar from previous surgery: Secondary | ICD-10-CM | POA: Diagnosis not present

## 2022-07-09 DIAGNOSIS — Z87891 Personal history of nicotine dependence: Secondary | ICD-10-CM

## 2022-07-09 DIAGNOSIS — Z933 Colostomy status: Secondary | ICD-10-CM

## 2022-07-09 DIAGNOSIS — Z86718 Personal history of other venous thrombosis and embolism: Secondary | ICD-10-CM

## 2022-07-09 DIAGNOSIS — Z888 Allergy status to other drugs, medicaments and biological substances status: Secondary | ICD-10-CM | POA: Diagnosis not present

## 2022-07-09 DIAGNOSIS — Z79899 Other long term (current) drug therapy: Secondary | ICD-10-CM

## 2022-07-09 DIAGNOSIS — Z408 Encounter for other prophylactic surgery: Secondary | ICD-10-CM | POA: Diagnosis not present

## 2022-07-09 DIAGNOSIS — Z9049 Acquired absence of other specified parts of digestive tract: Secondary | ICD-10-CM

## 2022-07-09 HISTORY — PX: PROCTOSCOPY: SHX2266

## 2022-07-09 HISTORY — PX: LAPAROSCOPIC LYSIS OF ADHESIONS: SHX5905

## 2022-07-09 HISTORY — PX: XI ROBOTIC ASSISTED COLOSTOMY TAKEDOWN: SHX6828

## 2022-07-09 HISTORY — DX: Incisional hernia without obstruction or gangrene: K43.2

## 2022-07-09 LAB — TYPE AND SCREEN
ABO/RH(D): A NEG
Antibody Screen: NEGATIVE

## 2022-07-09 SURGERY — CLOSURE, COLOSTOMY, ROBOT-ASSISTED
Anesthesia: General | Site: Ureter

## 2022-07-09 MED ORDER — ROCURONIUM BROMIDE 10 MG/ML (PF) SYRINGE
PREFILLED_SYRINGE | INTRAVENOUS | Status: AC
  Filled 2022-07-09: qty 10

## 2022-07-09 MED ORDER — ACETAMINOPHEN 500 MG PO TABS
1000.0000 mg | ORAL_TABLET | Freq: Four times a day (QID) | ORAL | Status: DC
  Administered 2022-07-09 – 2022-07-13 (×15): 1000 mg via ORAL
  Filled 2022-07-09 (×15): qty 2

## 2022-07-09 MED ORDER — GABAPENTIN 100 MG PO CAPS
200.0000 mg | ORAL_CAPSULE | Freq: Three times a day (TID) | ORAL | Status: DC
  Administered 2022-07-09 – 2022-07-10 (×5): 200 mg via ORAL
  Filled 2022-07-09 (×5): qty 2

## 2022-07-09 MED ORDER — CHLORHEXIDINE GLUCONATE CLOTH 2 % EX PADS: 6.0000 | MEDICATED_PAD | Freq: Once | CUTANEOUS | Status: DC

## 2022-07-09 MED ORDER — AMPHETAMINE-DEXTROAMPHET ER 10 MG PO CP24
30.0000 mg | ORAL_CAPSULE | Freq: Every day | ORAL | Status: DC
  Filled 2022-07-09: qty 1
  Filled 2022-07-09: qty 3

## 2022-07-09 MED ORDER — LIDOCAINE HCL (PF) 2 % IJ SOLN
INTRAMUSCULAR | Status: DC | PRN
  Administered 2022-07-09: 1.5 mg/kg/h

## 2022-07-09 MED ORDER — HYDRALAZINE HCL 20 MG/ML IJ SOLN: 10.0000 mg | INTRAMUSCULAR | Status: DC | PRN

## 2022-07-09 MED ORDER — PROPOFOL 500 MG/50ML IV EMUL
INTRAVENOUS | Status: DC | PRN
  Administered 2022-07-09: 25 ug/kg/min via INTRAVENOUS

## 2022-07-09 MED ORDER — DIPHENHYDRAMINE HCL 12.5 MG/5ML PO ELIX: 12.5000 mg | ORAL_SOLUTION | Freq: Four times a day (QID) | ORAL | Status: DC | PRN

## 2022-07-09 MED ORDER — NEOMYCIN SULFATE 500 MG PO TABS: 1000.0000 mg | ORAL_TABLET | ORAL | Status: DC

## 2022-07-09 MED ORDER — DEXAMETHASONE SODIUM PHOSPHATE 10 MG/ML IJ SOLN
INTRAMUSCULAR | Status: AC
  Filled 2022-07-09: qty 1

## 2022-07-09 MED ORDER — POLYETHYLENE GLYCOL 3350 17 GM/SCOOP PO POWD: 1.0000 | Freq: Once | ORAL | Status: DC

## 2022-07-09 MED ORDER — ENSURE SURGERY PO LIQD
237.0000 mL | Freq: Two times a day (BID) | ORAL | Status: DC
  Administered 2022-07-09 – 2022-07-12 (×6): 237 mL via ORAL

## 2022-07-09 MED ORDER — METHOCARBAMOL 500 MG PO TABS
1000.0000 mg | ORAL_TABLET | Freq: Four times a day (QID) | ORAL | Status: DC | PRN
  Administered 2022-07-09 – 2022-07-11 (×4): 1000 mg via ORAL
  Filled 2022-07-09 (×4): qty 2

## 2022-07-09 MED ORDER — 0.9 % SODIUM CHLORIDE (POUR BTL) OPTIME
TOPICAL | Status: DC | PRN
  Administered 2022-07-09: 2000 mL

## 2022-07-09 MED ORDER — SODIUM CHLORIDE (PF) 0.9 % IJ SOLN
INTRAMUSCULAR | Status: AC
  Filled 2022-07-09: qty 50

## 2022-07-09 MED ORDER — ENOXAPARIN SODIUM 40 MG/0.4ML IJ SOSY
40.0000 mg | PREFILLED_SYRINGE | INTRAMUSCULAR | Status: DC
  Administered 2022-07-10 – 2022-07-13 (×4): 40 mg via SUBCUTANEOUS
  Filled 2022-07-09 (×4): qty 0.4

## 2022-07-09 MED ORDER — ONDANSETRON HCL 4 MG/2ML IJ SOLN
INTRAMUSCULAR | Status: AC
  Filled 2022-07-09: qty 2

## 2022-07-09 MED ORDER — PROCHLORPERAZINE MALEATE 10 MG PO TABS: 10.0000 mg | ORAL_TABLET | Freq: Four times a day (QID) | ORAL | Status: DC | PRN

## 2022-07-09 MED ORDER — LIDOCAINE HCL (PF) 2 % IJ SOLN
INTRAMUSCULAR | Status: AC
  Filled 2022-07-09: qty 5

## 2022-07-09 MED ORDER — CLONAZEPAM 0.5 MG PO TABS
0.5000 mg | ORAL_TABLET | Freq: Two times a day (BID) | ORAL | Status: DC | PRN
  Administered 2022-07-12: 1 mg via ORAL
  Filled 2022-07-09: qty 2

## 2022-07-09 MED ORDER — SODIUM CHLORIDE 0.9 % IV SOLN
2.0000 g | INTRAVENOUS | Status: AC
  Administered 2022-07-09: 2 g via INTRAVENOUS
  Filled 2022-07-09: qty 2

## 2022-07-09 MED ORDER — TRAMADOL HCL 50 MG PO TABS: 50.0000 mg | ORAL_TABLET | Freq: Four times a day (QID) | ORAL | 0 refills | Status: DC | PRN

## 2022-07-09 MED ORDER — LACTATED RINGERS IV SOLN: INTRAVENOUS | Status: DC | PRN

## 2022-07-09 MED ORDER — PROPOFOL 10 MG/ML IV BOLUS
INTRAVENOUS | Status: AC
  Filled 2022-07-09: qty 20

## 2022-07-09 MED ORDER — TRAMADOL HCL 50 MG PO TABS
50.0000 mg | ORAL_TABLET | Freq: Four times a day (QID) | ORAL | Status: DC | PRN
  Administered 2022-07-09 – 2022-07-10 (×2): 50 mg via ORAL
  Administered 2022-07-10 – 2022-07-13 (×5): 100 mg via ORAL
  Filled 2022-07-09 (×4): qty 2
  Filled 2022-07-09: qty 1
  Filled 2022-07-09 (×2): qty 2
  Filled 2022-07-09: qty 1

## 2022-07-09 MED ORDER — FENTANYL CITRATE (PF) 250 MCG/5ML IJ SOLN
INTRAMUSCULAR | Status: AC
  Filled 2022-07-09: qty 5

## 2022-07-09 MED ORDER — METRONIDAZOLE 500 MG PO TABS: 1000.0000 mg | ORAL_TABLET | ORAL | Status: DC

## 2022-07-09 MED ORDER — ROSUVASTATIN CALCIUM 5 MG PO TABS
5.0000 mg | ORAL_TABLET | Freq: Every day | ORAL | Status: DC
  Administered 2022-07-09 – 2022-07-13 (×5): 5 mg via ORAL
  Filled 2022-07-09 (×5): qty 1

## 2022-07-09 MED ORDER — PROCHLORPERAZINE EDISYLATE 10 MG/2ML IJ SOLN: 5.0000 mg | Freq: Four times a day (QID) | INTRAMUSCULAR | Status: DC | PRN

## 2022-07-09 MED ORDER — VITAMIN C 500 MG PO TABS
500.0000 mg | ORAL_TABLET | Freq: Every day | ORAL | Status: DC
  Administered 2022-07-10 – 2022-07-13 (×4): 500 mg via ORAL
  Filled 2022-07-09 (×4): qty 1

## 2022-07-09 MED ORDER — 0.9 % SODIUM CHLORIDE (POUR BTL) OPTIME
TOPICAL | Status: DC | PRN
  Administered 2022-07-09: 1000 mL

## 2022-07-09 MED ORDER — ALVIMOPAN 12 MG PO CAPS
12.0000 mg | ORAL_CAPSULE | ORAL | Status: AC
  Administered 2022-07-09: 12 mg via ORAL
  Filled 2022-07-09: qty 1

## 2022-07-09 MED ORDER — FENTANYL CITRATE (PF) 100 MCG/2ML IJ SOLN
INTRAMUSCULAR | Status: DC | PRN
  Administered 2022-07-09: 50 ug via INTRAVENOUS
  Administered 2022-07-09: 100 ug via INTRAVENOUS
  Administered 2022-07-09 (×2): 50 ug via INTRAVENOUS

## 2022-07-09 MED ORDER — POLYETHYLENE GLYCOL 3350 17 G PO PACK
17.0000 g | PACK | Freq: Every evening | ORAL | Status: DC
  Administered 2022-07-09 – 2022-07-10 (×2): 17 g via ORAL
  Filled 2022-07-09 (×2): qty 1

## 2022-07-09 MED ORDER — ROCURONIUM BROMIDE 10 MG/ML (PF) SYRINGE
PREFILLED_SYRINGE | INTRAVENOUS | Status: DC | PRN
  Administered 2022-07-09: 50 mg via INTRAVENOUS
  Administered 2022-07-09: 100 mg via INTRAVENOUS
  Administered 2022-07-09: 20 mg via INTRAVENOUS

## 2022-07-09 MED ORDER — SUGAMMADEX SODIUM 200 MG/2ML IV SOLN
INTRAVENOUS | Status: DC | PRN
  Administered 2022-07-09: 200 mg via INTRAVENOUS

## 2022-07-09 MED ORDER — LIDOCAINE 2% (20 MG/ML) 5 ML SYRINGE
INTRAMUSCULAR | Status: DC | PRN
  Administered 2022-07-09: 60 mg via INTRAVENOUS

## 2022-07-09 MED ORDER — STERILE WATER FOR INJECTION IJ SOLN
INTRAMUSCULAR | Status: AC
  Filled 2022-07-09: qty 10

## 2022-07-09 MED ORDER — PHENYLEPHRINE 80 MCG/ML (10ML) SYRINGE FOR IV PUSH (FOR BLOOD PRESSURE SUPPORT)
PREFILLED_SYRINGE | INTRAVENOUS | Status: DC | PRN
  Administered 2022-07-09 (×4): 120 ug via INTRAVENOUS

## 2022-07-09 MED ORDER — ENSURE PRE-SURGERY PO LIQD
592.0000 mL | Freq: Once | ORAL | Status: DC
  Filled 2022-07-09: qty 592

## 2022-07-09 MED ORDER — CALCIUM POLYCARBOPHIL 625 MG PO TABS
625.0000 mg | ORAL_TABLET | Freq: Two times a day (BID) | ORAL | Status: DC
  Administered 2022-07-09 – 2022-07-13 (×8): 625 mg via ORAL
  Filled 2022-07-09 (×8): qty 1

## 2022-07-09 MED ORDER — BUPIVACAINE LIPOSOME 1.3 % IJ SUSP
INTRAMUSCULAR | Status: DC | PRN
  Administered 2022-07-09: 20 mL

## 2022-07-09 MED ORDER — BUPIVACAINE LIPOSOME 1.3 % IJ SUSP: 20.0000 mL | Freq: Once | INTRAMUSCULAR | Status: DC

## 2022-07-09 MED ORDER — LACTATED RINGERS IV BOLUS: 1000.0000 mL | Freq: Three times a day (TID) | INTRAVENOUS | Status: AC | PRN

## 2022-07-09 MED ORDER — ACETAMINOPHEN 500 MG PO TABS
1000.0000 mg | ORAL_TABLET | ORAL | Status: AC
  Administered 2022-07-09: 1000 mg via ORAL
  Filled 2022-07-09: qty 2

## 2022-07-09 MED ORDER — PROPOFOL 10 MG/ML IV BOLUS
INTRAVENOUS | Status: DC | PRN
  Administered 2022-07-09: 140 mg via INTRAVENOUS

## 2022-07-09 MED ORDER — MAGIC MOUTHWASH: 15.0000 mL | Freq: Four times a day (QID) | ORAL | Status: DC | PRN

## 2022-07-09 MED ORDER — ENOXAPARIN SODIUM 40 MG/0.4ML IJ SOSY
40.0000 mg | PREFILLED_SYRINGE | Freq: Once | INTRAMUSCULAR | Status: AC
  Administered 2022-07-09: 40 mg via SUBCUTANEOUS
  Filled 2022-07-09: qty 0.4

## 2022-07-09 MED ORDER — DIPHENHYDRAMINE HCL 50 MG/ML IJ SOLN: 12.5000 mg | Freq: Four times a day (QID) | INTRAMUSCULAR | Status: DC | PRN

## 2022-07-09 MED ORDER — LACTATED RINGERS IR SOLN
Status: DC | PRN
  Administered 2022-07-09: 1000 mL

## 2022-07-09 MED ORDER — BUPIVACAINE-EPINEPHRINE 0.5% -1:200000 IJ SOLN
INTRAMUSCULAR | Status: DC | PRN
  Administered 2022-07-09: 30 mL

## 2022-07-09 MED ORDER — CHLORHEXIDINE GLUCONATE 0.12 % MT SOLN: 15.0000 mL | Freq: Once | OROMUCOSAL | Status: DC

## 2022-07-09 MED ORDER — METHOCARBAMOL 1000 MG/10ML IJ SOLN: 1000.0000 mg | Freq: Four times a day (QID) | INTRAVENOUS | Status: DC | PRN

## 2022-07-09 MED ORDER — ORAL CARE MOUTH RINSE: 15.0000 mL | Freq: Once | OROMUCOSAL | Status: DC

## 2022-07-09 MED ORDER — KETAMINE HCL 10 MG/ML IJ SOLN
INTRAMUSCULAR | Status: DC | PRN
  Administered 2022-07-09: 30 mg via INTRAVENOUS

## 2022-07-09 MED ORDER — BISACODYL 5 MG PO TBEC: 20.0000 mg | DELAYED_RELEASE_TABLET | Freq: Once | ORAL | Status: DC

## 2022-07-09 MED ORDER — DEXAMETHASONE SODIUM PHOSPHATE 4 MG/ML IJ SOLN
INTRAMUSCULAR | Status: DC | PRN
  Administered 2022-07-09: 5 mg via INTRAVENOUS

## 2022-07-09 MED ORDER — ALVIMOPAN 12 MG PO CAPS
12.0000 mg | ORAL_CAPSULE | Freq: Two times a day (BID) | ORAL | Status: DC
  Administered 2022-07-10 – 2022-07-11 (×3): 12 mg via ORAL
  Filled 2022-07-09 (×4): qty 1

## 2022-07-09 MED ORDER — HYDROMORPHONE HCL 1 MG/ML IJ SOLN: 0.2500 mg | INTRAMUSCULAR | Status: DC | PRN

## 2022-07-09 MED ORDER — HYDROMORPHONE HCL 1 MG/ML IJ SOLN
0.5000 mg | INTRAMUSCULAR | Status: DC | PRN
  Administered 2022-07-09 – 2022-07-10 (×4): 1 mg via INTRAVENOUS
  Filled 2022-07-09 (×4): qty 1

## 2022-07-09 MED ORDER — LACTATED RINGERS IV SOLN: INTRAVENOUS | Status: DC

## 2022-07-09 MED ORDER — ONDANSETRON HCL 4 MG/2ML IJ SOLN
INTRAMUSCULAR | Status: DC | PRN
  Administered 2022-07-09: 4 mg via INTRAVENOUS

## 2022-07-09 MED ORDER — ENSURE PRE-SURGERY PO LIQD
296.0000 mL | Freq: Once | ORAL | Status: DC
  Filled 2022-07-09: qty 296

## 2022-07-09 MED ORDER — MIDAZOLAM HCL 5 MG/5ML IJ SOLN
INTRAMUSCULAR | Status: DC | PRN
  Administered 2022-07-09: 2 mg via INTRAVENOUS

## 2022-07-09 MED ORDER — BUPIVACAINE LIPOSOME 1.3 % IJ SUSP
INTRAMUSCULAR | Status: AC
  Filled 2022-07-09: qty 20

## 2022-07-09 MED ORDER — ONDANSETRON HCL 4 MG/2ML IJ SOLN
4.0000 mg | Freq: Four times a day (QID) | INTRAMUSCULAR | Status: DC | PRN
  Filled 2022-07-09: qty 2

## 2022-07-09 MED ORDER — EPHEDRINE SULFATE-NACL 50-0.9 MG/10ML-% IV SOSY
PREFILLED_SYRINGE | INTRAVENOUS | Status: DC | PRN
  Administered 2022-07-09: 10 mg via INTRAVENOUS

## 2022-07-09 MED ORDER — METOPROLOL TARTRATE 5 MG/5ML IV SOLN: 5.0000 mg | Freq: Four times a day (QID) | INTRAVENOUS | Status: DC | PRN

## 2022-07-09 MED ORDER — MIDAZOLAM HCL 2 MG/2ML IJ SOLN
INTRAMUSCULAR | Status: AC
  Filled 2022-07-09: qty 2

## 2022-07-09 MED ORDER — EPHEDRINE 5 MG/ML INJ
INTRAVENOUS | Status: AC
  Filled 2022-07-09: qty 5

## 2022-07-09 MED ORDER — SUCCINYLCHOLINE CHLORIDE 200 MG/10ML IV SOSY
PREFILLED_SYRINGE | INTRAVENOUS | Status: AC
  Filled 2022-07-09: qty 10

## 2022-07-09 MED ORDER — MELATONIN 3 MG PO TABS: 3.0000 mg | ORAL_TABLET | Freq: Every evening | ORAL | Status: DC | PRN

## 2022-07-09 MED ORDER — INDOCYANINE GREEN 25 MG IV SOLR
INTRAVENOUS | Status: DC | PRN
  Administered 2022-07-09: 37.5 mg

## 2022-07-09 MED ORDER — SODIUM CHLORIDE 0.9 % IV SOLN
2.0000 g | Freq: Two times a day (BID) | INTRAVENOUS | Status: AC
  Administered 2022-07-09: 2 g via INTRAVENOUS
  Filled 2022-07-09: qty 2

## 2022-07-09 MED ORDER — SODIUM CHLORIDE 0.9 % IR SOLN
Status: DC | PRN
  Administered 2022-07-09: 1000 mL

## 2022-07-09 MED ORDER — LIDOCAINE HCL 2 % IJ SOLN
INTRAMUSCULAR | Status: AC
  Filled 2022-07-09: qty 20

## 2022-07-09 MED ORDER — BUPIVACAINE-EPINEPHRINE (PF) 0.5% -1:200000 IJ SOLN
INTRAMUSCULAR | Status: AC
  Filled 2022-07-09: qty 30

## 2022-07-09 MED ORDER — PHENYLEPHRINE HCL-NACL 20-0.9 MG/250ML-% IV SOLN
INTRAVENOUS | Status: DC | PRN
  Administered 2022-07-09: 30 ug/min via INTRAVENOUS

## 2022-07-09 MED ORDER — LIP MEDEX EX OINT
TOPICAL_OINTMENT | Freq: Two times a day (BID) | CUTANEOUS | Status: DC
  Administered 2022-07-09 (×2): 75 via TOPICAL
  Administered 2022-07-10: 1 via TOPICAL
  Filled 2022-07-09 (×2): qty 7

## 2022-07-09 MED ORDER — FLUTICASONE PROPIONATE 50 MCG/ACT NA SUSP: 1.0000 | Freq: Every day | NASAL | Status: DC | PRN

## 2022-07-09 MED ORDER — GABAPENTIN 300 MG PO CAPS
300.0000 mg | ORAL_CAPSULE | ORAL | Status: AC
  Administered 2022-07-09: 300 mg via ORAL
  Filled 2022-07-09: qty 1

## 2022-07-09 MED ORDER — SUCCINYLCHOLINE CHLORIDE 200 MG/10ML IV SOSY
PREFILLED_SYRINGE | INTRAVENOUS | Status: DC | PRN
  Administered 2022-07-09: 100 mg via INTRAVENOUS

## 2022-07-09 MED ORDER — MELATONIN 5 MG PO TABS: 5.0000 mg | ORAL_TABLET | Freq: Every evening | ORAL | Status: DC | PRN

## 2022-07-09 MED ORDER — SODIUM CHLORIDE (PF) 0.9 % IJ SOLN
INTRAMUSCULAR | Status: DC | PRN
  Administered 2022-07-09: 30 mL

## 2022-07-09 MED ORDER — KETAMINE HCL 10 MG/ML IJ SOLN
INTRAMUSCULAR | Status: AC
  Filled 2022-07-09: qty 1

## 2022-07-09 MED ORDER — ALUM & MAG HYDROXIDE-SIMETH 200-200-20 MG/5ML PO SUSP: 30.0000 mL | Freq: Four times a day (QID) | ORAL | Status: DC | PRN

## 2022-07-09 MED ORDER — PHENYLEPHRINE 80 MCG/ML (10ML) SYRINGE FOR IV PUSH (FOR BLOOD PRESSURE SUPPORT)
PREFILLED_SYRINGE | INTRAVENOUS | Status: AC
  Filled 2022-07-09: qty 20

## 2022-07-09 MED ORDER — ONDANSETRON HCL 4 MG PO TABS: 4.0000 mg | ORAL_TABLET | Freq: Four times a day (QID) | ORAL | Status: DC | PRN

## 2022-07-09 MED ORDER — SIMETHICONE 80 MG PO CHEW: 40.0000 mg | CHEWABLE_TABLET | Freq: Four times a day (QID) | ORAL | Status: DC | PRN

## 2022-07-09 SURGICAL SUPPLY — 121 items
ADAPTER GOLDBERG URETERAL (ADAPTER) IMPLANT
APPLIER CLIP 5 13 M/L LIGAMAX5 (MISCELLANEOUS)
APPLIER CLIP ROT 10 11.4 M/L (STAPLE)
BAG COUNTER SPONGE SURGICOUNT (BAG) ×2 IMPLANT
BAG URO CATCHER STRL LF (MISCELLANEOUS) ×2 IMPLANT
BINDER ABDOMINAL 12 ML 46-62 (SOFTGOODS) IMPLANT
BLADE EXTENDED COATED 6.5IN (ELECTRODE) IMPLANT
CANNULA REDUC XI 12-8 STAPL (CANNULA)
CANNULA REDUCER 12-8 DVNC XI (CANNULA) IMPLANT
CATH URETL OPEN 5X70 (CATHETERS) IMPLANT
CELLS DAT CNTRL 66122 CELL SVR (MISCELLANEOUS) IMPLANT
CHLORAPREP W/TINT 26 (MISCELLANEOUS) IMPLANT
CLIP APPLIE 5 13 M/L LIGAMAX5 (MISCELLANEOUS) IMPLANT
CLIP APPLIE ROT 10 11.4 M/L (STAPLE) IMPLANT
CLOTH BEACON ORANGE TIMEOUT ST (SAFETY) ×2 IMPLANT
COVER SURGICAL LIGHT HANDLE (MISCELLANEOUS) ×4 IMPLANT
COVER TIP SHEARS 8 DVNC (MISCELLANEOUS) ×2 IMPLANT
COVER TIP SHEARS 8MM DA VINCI (MISCELLANEOUS) ×2
DEVICE TROCAR PUNCTURE CLOSURE (ENDOMECHANICALS) IMPLANT
DRAIN CHANNEL 19F RND (DRAIN) IMPLANT
DRAPE ARM DVNC X/XI (DISPOSABLE) ×8 IMPLANT
DRAPE COLUMN DVNC XI (DISPOSABLE) ×2 IMPLANT
DRAPE DA VINCI XI ARM (DISPOSABLE) ×8
DRAPE DA VINCI XI COLUMN (DISPOSABLE) ×2
DRAPE SURG IRRIG POUCH 19X23 (DRAPES) ×2 IMPLANT
DRSG OPSITE POSTOP 4X10 (GAUZE/BANDAGES/DRESSINGS) IMPLANT
DRSG OPSITE POSTOP 4X6 (GAUZE/BANDAGES/DRESSINGS) IMPLANT
DRSG OPSITE POSTOP 4X8 (GAUZE/BANDAGES/DRESSINGS) IMPLANT
DRSG TEGADERM 2-3/8X2-3/4 SM (GAUZE/BANDAGES/DRESSINGS) ×10 IMPLANT
DRSG TEGADERM 4X4.75 (GAUZE/BANDAGES/DRESSINGS) IMPLANT
ELECT PENCIL ROCKER SW 15FT (MISCELLANEOUS) ×2 IMPLANT
ELECT REM PT RETURN 15FT ADLT (MISCELLANEOUS) ×2 IMPLANT
ENDOLOOP SUT PDS II  0 18 (SUTURE)
ENDOLOOP SUT PDS II 0 18 (SUTURE) IMPLANT
EVACUATOR SILICONE 100CC (DRAIN) IMPLANT
GAUZE SPONGE 2X2 8PLY STRL LF (GAUZE/BANDAGES/DRESSINGS) ×2 IMPLANT
GLOVE ECLIPSE 8.0 STRL XLNG CF (GLOVE) ×6 IMPLANT
GLOVE INDICATOR 8.0 STRL GRN (GLOVE) ×6 IMPLANT
GLOVE SURG LX STRL 7.5 STRW (GLOVE) ×2 IMPLANT
GOWN SRG XL LVL 4 BRTHBL STRL (GOWNS) ×2 IMPLANT
GOWN STRL NON-REIN XL LVL4 (GOWNS) ×2
GOWN STRL REUS W/ TWL XL LVL3 (GOWN DISPOSABLE) ×10 IMPLANT
GOWN STRL REUS W/TWL XL LVL3 (GOWN DISPOSABLE) ×18
GRASPER SUT TROCAR 14GX15 (MISCELLANEOUS) IMPLANT
GUIDEWIRE ANG ZIPWIRE 038X150 (WIRE) IMPLANT
GUIDEWIRE STR DUAL SENSOR (WIRE) IMPLANT
HOLDER FOLEY CATH W/STRAP (MISCELLANEOUS) ×2 IMPLANT
IRRIG SUCT STRYKERFLOW 2 WTIP (MISCELLANEOUS) ×2
IRRIGATION SUCT STRKRFLW 2 WTP (MISCELLANEOUS) ×2 IMPLANT
KIT PROCEDURE DA VINCI SI (MISCELLANEOUS) ×4
KIT PROCEDURE DVNC SI (MISCELLANEOUS) ×2 IMPLANT
KIT SIGMOIDOSCOPE (SET/KITS/TRAYS/PACK) IMPLANT
KIT TURNOVER KIT A (KITS) IMPLANT
MANIFOLD NEPTUNE II (INSTRUMENTS) ×2 IMPLANT
NDL INSUFFLATION 14GA 120MM (NEEDLE) ×2 IMPLANT
NEEDLE INSUFFLATION 14GA 120MM (NEEDLE) ×2 IMPLANT
PACK CARDIOVASCULAR III (CUSTOM PROCEDURE TRAY) ×2 IMPLANT
PACK COLON (CUSTOM PROCEDURE TRAY) ×2 IMPLANT
PACK CYSTO (CUSTOM PROCEDURE TRAY) ×2 IMPLANT
PAD POSITIONING PINK XL (MISCELLANEOUS) ×2 IMPLANT
PROTECTOR NERVE ULNAR (MISCELLANEOUS) ×4 IMPLANT
RELOAD STAPLE 45 3.5 BLU DVNC (STAPLE) IMPLANT
RELOAD STAPLE 45 4.3 GRN DVNC (STAPLE) IMPLANT
RELOAD STAPLE 60 3.5 BLU DVNC (STAPLE) IMPLANT
RELOAD STAPLE 60 4.3 GRN DVNC (STAPLE) IMPLANT
RELOAD STAPLER 3.5X45 BLU DVNC (STAPLE) IMPLANT
RELOAD STAPLER 3.5X60 BLU DVNC (STAPLE) ×2 IMPLANT
RELOAD STAPLER 4.3X45 GRN DVNC (STAPLE) IMPLANT
RELOAD STAPLER 4.3X60 GRN DVNC (STAPLE) IMPLANT
RETRACTOR WND ALEXIS 18 MED (MISCELLANEOUS) IMPLANT
RTRCTR WOUND ALEXIS 18CM MED (MISCELLANEOUS)
SCISSORS LAP 5X35 DISP (ENDOMECHANICALS) ×2 IMPLANT
SEAL CANN UNIV 5-8 DVNC XI (MISCELLANEOUS) ×6 IMPLANT
SEAL XI 5MM-8MM UNIVERSAL (MISCELLANEOUS) ×8
SEALER VESSEL DA VINCI XI (MISCELLANEOUS) ×2
SEALER VESSEL EXT DVNC XI (MISCELLANEOUS) ×2 IMPLANT
STAPLER 45 DA VINCI SURE FORM (STAPLE)
STAPLER 45 SUREFORM DVNC (STAPLE) IMPLANT
STAPLER 60 DA VINCI SURE FORM (STAPLE) ×2
STAPLER 60 SUREFORM DVNC (STAPLE) IMPLANT
STAPLER CANNULA SEAL DVNC XI (STAPLE) ×2 IMPLANT
STAPLER CANNULA SEAL XI (STAPLE) ×2
STAPLER ECHELON POWER CIR 29 (STAPLE) IMPLANT
STAPLER ECHELON POWER CIR 31 (STAPLE) IMPLANT
STAPLER RELOAD 3.5X45 BLU DVNC (STAPLE)
STAPLER RELOAD 3.5X45 BLUE (STAPLE)
STAPLER RELOAD 3.5X60 BLU DVNC (STAPLE) ×2
STAPLER RELOAD 3.5X60 BLUE (STAPLE) ×2
STAPLER RELOAD 4.3X45 GREEN (STAPLE)
STAPLER RELOAD 4.3X45 GRN DVNC (STAPLE)
STAPLER RELOAD 4.3X60 GREEN (STAPLE)
STAPLER RELOAD 4.3X60 GRN DVNC (STAPLE)
STOPCOCK 4 WAY LG BORE MALE ST (IV SETS) ×4 IMPLANT
SURGILUBE 2OZ TUBE FLIPTOP (MISCELLANEOUS) IMPLANT
SUT MNCRL AB 4-0 PS2 18 (SUTURE) ×2 IMPLANT
SUT PDS AB 1 CT1 27 (SUTURE) ×4 IMPLANT
SUT PROLENE 0 CT 2 (SUTURE) IMPLANT
SUT PROLENE 2 0 KS (SUTURE) IMPLANT
SUT PROLENE 2 0 SH DA (SUTURE) IMPLANT
SUT SILK 2 0 (SUTURE)
SUT SILK 2 0 SH CR/8 (SUTURE) IMPLANT
SUT SILK 2-0 18XBRD TIE 12 (SUTURE) IMPLANT
SUT SILK 3 0 (SUTURE)
SUT SILK 3 0 SH CR/8 (SUTURE) ×2 IMPLANT
SUT SILK 3-0 18XBRD TIE 12 (SUTURE) IMPLANT
SUT V-LOC BARB 180 2/0GR6 GS22 (SUTURE) ×4
SUT VIC AB 3-0 SH 18 (SUTURE) IMPLANT
SUT VIC AB 3-0 SH 27 (SUTURE)
SUT VIC AB 3-0 SH 27XBRD (SUTURE) IMPLANT
SUT VICRYL 0 UR6 27IN ABS (SUTURE) ×2 IMPLANT
SUTURE V-LC BRB 180 2/0GR6GS22 (SUTURE) IMPLANT
SYR 10ML ECCENTRIC (SYRINGE) ×2 IMPLANT
SYS LAPSCP GELPORT 120MM (MISCELLANEOUS)
SYS WOUND ALEXIS 18CM MED (MISCELLANEOUS) ×2
SYSTEM LAPSCP GELPORT 120MM (MISCELLANEOUS) IMPLANT
SYSTEM WOUND ALEXIS 18CM MED (MISCELLANEOUS) ×2 IMPLANT
TOWEL OR NON WOVEN STRL DISP B (DISPOSABLE) ×2 IMPLANT
TROCAR ADV FIXATION 5X100MM (TROCAR) ×2 IMPLANT
TUBING CONNECTING 10 (TUBING) ×6 IMPLANT
TUBING INSUFFLATION 10FT LAP (TUBING) ×2 IMPLANT
TUBING UROLOGY SET (TUBING) IMPLANT

## 2022-07-09 NOTE — Transfer of Care (Signed)
Immediate Anesthesia Transfer of Care Note  Patient: Michele [name]  Procedure(s) Performed: ROBOTIC OSTOMY TAKEDOWN ROBOTIC AND LAP LYSIS OF ADHESIONS, BILATERAL TAP BLOCK RIGID PROCTOSCOPY CYSTOSCOPY with FIREFLY INJECTION ([place])  Patient Location: PACU  Anesthesia Type:General  Level of Consciousness: awake and patient cooperative  Airway & Oxygen Therapy: Patient Spontanous Breathing and Patient connected to face mask  Post-op Assessment: Report given to RN and Post -op Vital signs reviewed and stable  Post vital signs: Reviewed and stable  Last Vitals:  Vitals Value Taken Time  BP    Temp    Pulse 77 07/09/22 1246  Resp 15 07/09/22 1246  SpO2 100 % 07/09/22 1246  Vitals shown include unvalidated device data.  Last Pain:  Vitals:   07/09/22 0710  TempSrc:   PainSc: 0-No pain         Complications: No notable events documented.

## 2022-07-09 NOTE — Anesthesia Postprocedure Evaluation (Signed)
Anesthesia Post Note  Patient: BERTHA LOKKEN  Procedure(s) Performed: ROBOTIC OSTOMY TAKEDOWN ROBOTIC AND LAP LYSIS OF ADHESIONS, BILATERAL TAP BLOCK RIGID PROCTOSCOPY CYSTOSCOPY with FIREFLY INJECTION ([place])     Patient location during evaluation: PACU Anesthesia Type: General Level of consciousness: awake Pain management: pain level controlled Respiratory status: spontaneous breathing Cardiovascular status: stable Postop Assessment: no apparent nausea or vomiting Anesthetic complications: no   No notable events documented.  Last Vitals:  Vitals:   07/09/22 1526 07/09/22 1625  BP: 137/84 132/80  Pulse: 77 69  Resp: 18 18  Temp: 36.8 C 36.8 C  SpO2: 100% 99%    Last Pain:  Vitals:   07/09/22 1625  TempSrc: Oral  PainSc:                  Kimyata Milich

## 2022-07-09 NOTE — H&P (Signed)
07/09/2022      REFERRING PHYSICIAN: Caffie Damme, MD  Patient Care Team: Early, Michele Pesa, NP as PCP - General Michele [name] Michele Schuller, MD as Consulting Provider (General Surgery) Pinn, Michele Grime, MD (Obstetrics and Gynecology) Michele [name], Michele Saran, MD as Consulting Provider (Colon and Rectal Surgery)  PROVIDER: Hollace Kinnier, MD  [facility] MRN: [id number] DOB: [birth year]  SUBJECTIVE  Chief Complaint: Follow-up (3 week follow up )   History of Present Illness: Michele [name] ["GAIR-[id number]"] is a 56 y.o. female who is seen today as an office consultation at the request of Dr. Alwyn [name] for evaluation of Follow-up (3 week follow up )  Patient returns for wound check. She is due to get a colonoscopy October 18 by Dr. Rush [name]. Her initial resection showed no definite diverticulitis or cancer. Just inflammation. We have tentatively scheduled colostomy takedown in November pending wound healing. She is walking better. Her appetite is good. No issues with her colostomy. She has been packing her wound. She thinks it is about the same. No major drainage. Appetite better. She is nervous about having another operation but is hopeful to get the colostomy taken down. Michele [name] PRIOR NOTE Pleasant active woman. Underwent abdominal hysterectomy earlier in the year. 10 days later came in with worsening abdominal pain and peritonitis. Foul drainage from incision. Taken the operating room. Concern for colon perforation. Diverticulitis expected. Hartmann resection done 11/04/2021. Prolonged hospital stay. Developed dehiscence requiring reclosure with retention sutures early April. Pathology benign.Michele [name] Has been followed closely by Dr. [place] [name]. Gradual slow recovery. Had postoperative abscesses. Drains placed. All drains out by May. Chronic wound. Eventually retention sutures removed last month. Most of the wound is closed down and set for 1 spot. Interested in colostomy takedown. Internal referral made to  me to consider minimally invasive takedown.  Patient comes today with her ex-husband who has been helping take care of her. Her energy level is slowly recovering. Appetite is not there but improving. She has been taking a multivitamin. She still is rather constipated, moving her bowels about every other day. Not taking MiraLAX every day but just as needed. She is walking more. 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  Medical History:  Past Medical History: Diagnosis Date Anemia Anxiety Hypertension  Patient Active Problem List Diagnosis Perforated sigmoid colon (CMS-HCC) Chronic constipation Colostomy in place (CMS-HCC) BMI 30.0-30.9,adult S/P partial colectomy  Past Surgical History: Procedure Laterality Date HYSTERECTOMY 10/26/2021 COLECTOMY PARTIAL W/COLOSTOMY/CECOSTOMY 11/04/2021 SECONDARY CLOSURE SURGICAL WOUND ABDOMEN 11/14/2021 DEHISCENCE - RECLOSURE WITH RETENTION SUTURES CESAREAN SECTION N/A   Allergies Allergen Reactions Atorvastatin Other (See Comments) and Rash Muscle aches on Lipitor 20 mg and 40 mg Muscle aches on Lipitor 20 mg and 40 mg Muscle aches on Lipitor 20 mg and 40 mg Muscle aches on Lipitor 20 mg and 40 mg  Sulfa (Sulfonamide Antibiotics) Other (See Comments), Nausea And Vomiting and Rash Sulfasalazine Rash  Current Outpatient Medications on File Prior to Visit Medication Sig Dispense Refill citalopram (CELEXA) 20 MG tablet Take 20 mg by mouth once daily dextroamphetamine-amphetamine (ADDERALL XR) 30 MG XR capsule Take by mouth ibuprofen (MOTRIN) 800 MG tablet IBU 800 mg tablet take 1 tablet by mouth every 8 hours if needed for cramping lidocaine (LIDODERM) 5 % patch Place onto the skin methocarbamoL 1,000 mg Tab Take by mouth oxyCODONE (DAZIDOX) 10 mg immediate release tablet Take 1 tablet (  10 mg total) by mouth every 6  (six) hours as needed for Pain 25 tablet 0 oxyCODONE (DAZIDOX) 10 mg immediate release tablet Take 1/2 tab every 6 hours as needed for pain 15 tablet 0 oxyCODONE (ROXICODONE) 5 MG immediate release tablet Take 1 tablet (5 mg total) by mouth every 6 (six) hours as needed for Pain 25 tablet 0 rivaroxaban (XARELTO) 20 mg tablet Take 1 tablet (20 mg total) by mouth once daily (Patient not taking: Reported on 05/05/2022) 30 tablet 1 rosuvastatin (CRESTOR) 5 MG tablet Take 5 mg by mouth once daily  No current facility-administered medications on file prior to visit.  Family History Problem Relation Age of Onset High blood pressure (Hypertension) Mother Heart valve disease Father Hyperlipidemia (Elevated cholesterol) Father Breast cancer Sister   Social History  Tobacco Use Smoking Status Former Types: Cigarettes Smokeless Tobacco Never   Social History  Socioeconomic History Marital status: Divorced Tobacco Use Smoking status: Former Types: Cigarettes Smokeless tobacco: Never Vaping Use Vaping Use: Never used Substance and Sexual Activity Alcohol use: Never Drug use: Never  ############################################################  Review of Systems: A complete review of systems (ROS) was obtained from the patient. I have reviewed this information and discussed as appropriate with the patient. See HPI as well for other pertinent ROS.  Constitutional: No fevers, chills, sweats. Weight stable Eyes: No vision changes, No discharge HENT: No sore throats, nasal drainage Lymph: No neck swelling, No bruising easily Pulmonary: No cough, productive sputum CV: No orthopnea, PND . No exertional chest/neck/shoulder/arm pain. Patient can walk 20 minutes without difficulty.  GI: No personal nor family history of GI/colon cancer, inflammatory bowel disease, irritable bowel syndrome, allergy such as Celiac Sprue, dietary/dairy problems, colitis, ulcers nor gastritis. No recent sick  contacts/gastroenteritis. No travel outside the country. No changes in diet.  Renal: No UTIs, No hematuria Genital: No drainage, bleeding, masses Musculoskeletal: No severe joint pain. Good ROM major joints Skin: No sores or lesions Heme/Lymph: No easy bleeding. No swollen lymph nodes Neuro: No active seizures. No facial droop Psych: No hallucinations. No agitation  OBJECTIVE  There were no vitals filed for this visit. There is no height or weight on file to calculate BMI.  PHYSICAL EXAM:  Constitutional: Not cachectic. Hygeine adequate. Vitals signs as above. Eyes: No glasses. Vision adequate,Pupils reactive, normal extraocular movements. Sclera nonicteric Neuro: CN II-XII intact. No major focal sensory defects. No major motor deficits. Lymph: No head/neck/groin lymphadenopathy Psych: No severe agitation. Tearful but consolable. No severe anxiety. Judgment & insight Adequate, Oriented x4, HENT: Normocephalic, Mucus membranes moist. No thrush. Hearing: adequate Neck: Supple, No tracheal deviation. No obvious thyromegaly Chest: No pain to chest wall compression. Good respiratory excursion. No audible wheezing CV: Pulses intact. regular. No major extremity edema Ext: No obvious deformity or contracture. Edema: Not present. No cyanosis Skin: No major subcutaneous nodules. Warm and dry Musculoskeletal: Severe joint rigidity not present. No obvious clubbing. No digital petechiae. Mobility: no assist device moving easily without restrictions  Abdomen: Obese Soft. Nondistended. Nontender. Hernia: Supraumbilical bulging suspicious for deep incisional hernia. Nonincarcerated. Ostomy pink with gas. Pink scarring at retention suture spots. An inferior corner is a 3 mm opening that probes 15 mm deep - smaller. Could not feel or locate any retained stitch at the base. Otherwise clean. .. Diastasis recti: Mild supraumbilical midline. No hepatomegaly. No splenomegaly.  Genital/Pelvic: Inguinal  hernia: Not present. Inguinal lymph nodes: without lymphadenopathy nor hidradenitis.  Rectal: (Deferred)    ###################################################################  Labs, Imaging and Diagnostic  Testing:  Located in 'Care Everywhere' section of Epic EMR chart  PRIOR [facility] CLINIC NOTES:  Located in [place]' section of Epic EMR chart  SURGERY NOTES:  Located in [place]' section of Epic EMR chart  11/05/2021 POST-OPERATIVE DIAGNOSIS: Perforated sigmoid colon  PROCEDURE: Procedure(s): EXPLORATORY LAPAROTOMY SIGMOID COLECTOMY COLOSTOMY AND HARTMANS  SURGEON: Georganna Skeans, MD  11/14/2021 Preoperative diagnosis: History of exploratory laparotomy with fascial dehiscence  Postoperative diagnosis: Same  Procedure: Exploratory laparotomy with abdominal cavity washout and drainage of intra-abdominal abscess with retention suture closure en mass of abdominal wall  Surgeon: Erroll Luna, MD  PATHOLOGY:  Located in [place]' section of Epic EMR chart  SURGICAL PATHOLOGY CASE: [id number] PATIENT: Michele [name] Surgical Pathology Report  Clinical History: perforated bowel (cm)  FINAL MICROSCOPIC DIAGNOSIS:  A. COLON, SIGMOID, RESECTION: Focal mucosal and transmural necrosis and associated marked acute and chronic fibropurulent serositis consistent with perforation Acute and chronic fibrinous serositis present in margins Proximal and distal margins viable and free of mucosal inflammation  Akram Kissick DESCRIPTION:  The specimen is received fresh, and consists of a portion of colon with 2 stapled resection margins, measuring 5.8 cm in length. A suture is present designating the distal end. The serosal surface is pink-red, hyperemic, with diffuse tan-white exudate. The surrounding adipose tissue is focally indurated. A full-thickness perforation is identified, measuring 1.5 cm from the distal margin. Lumen is filled with a minimal  amount of tan-brown fecal material. Mucosa is tan-pink with normal folding, and no additional diverticuli or perforations are identified. The wall ranges from 0.2 to 0.3 cm in thickness. No enlarged lymph nodes are grossly identified. Representative sections are submitted in 4 cassettes. 1 = proximal margin 2 = distal margin 3 and 4 = area of perforation Craig Staggers 11/06/2021)  Final Diagnosis performed by Tobin Chad, MD. Electronically signed 11/07/2021 Technical and / or Professional components performed at [facility]. [facility], [place] [address], [place], [place] [address]. Immunohistochemistry Technical component (if applicable) was performed at [facility]. [address] [place], [place], [place] [address]. IMMUNOHISTOCHEMISTRY DISCLAIMER (if applicable): Some of these immunohistochemical stains may have been developed and the performance characteristics determine by [facility]. Some may not have been cleared or approved by the U.S. Food and Drug Administration. The FDA has determined that such clearance or approval is not necessary. This test is used for clinical purposes. It should not be regarded as investigational or for research. This laboratory is certified under the [place] (CLIA-88) as qualified to perform high complexity clinical laboratory testing. The controls stained appropriately.  Assessment and Plan: DIAGNOSES:  Diagnoses and all orders for this visit:  Perforated sigmoid colon (CMS-HCC)  Colostomy in place (CMS-HCC)  S/P partial colectomy  BMI 30.0-30.9,adult  Other orders - polyethylene glycol (MIRALAX) powder; Take 233.75 g by mouth once for 1 dose Take according to your procedure prep instructions. - bisacodyL (DULCOLAX) 5 mg EC tablet; Take 4 tablets (20 mg total) by mouth once daily as needed for Constipation for up to 1 dose - metroNIDAZOLE (FLAGYL) 500 MG tablet; Take  2 tablets (1,000 mg total) by mouth 3 (three) times daily for 3 doses SEE BOWEL PREP INSTRUCTIONS: Take 2 tablets at 2pm, 3pm, and 10pm the day prior to your colon operation. - neomycin 500 mg tablet; Take 2 tablets (1,000 mg total) by mouth 3 (three) times daily for 3 doses SEE BOWEL PREP INSTRUCTIONS: Take 2 tablets at 2pm, 3pm, and  10pm the day prior to your colon operation.    ASSESSMENT/PLAN  Sigmoid colon perforation 10 days after hysterectomy requiring emergent Hartmann resection colectomy/colostomy. Pathology is benign. No diverticula. Wonder if this is more of a stercoral perforation versus delayed colon serosal perforation postop.  Plan robotic minimally invasive approach.   The anatomy & physiology of the digestive tract was discussed. The pathophysiology was discussed. Possibility of remaining with an ostomy permanently was discussed. I offered ostomy takedown. Laparoscopic & open techniques were discussed.  Risks such as bleeding, infection, abscess, leak, reoperation, possible re-ostomy, injury to other organs, need for repair of tissues / organs, need for further treatment, hernia, heart attack, death, and other risks were discussed. I noted a good likelihood this will help address the problem. Goals of post-operative recovery were discussed as well. We will work to minimize complications. Questions were answered. The patient expresses understanding & wishes to proceed with surgery.  Colonoscopy clear  She may have an incisional hernia as well. We will have to table that intervention and focus on colostomy takedown first and then regroup. May be consider hernia repair with mesh in the future if needed.  History of DVT but stable. Safe to come off anticoagulation perioperatively. Usual protocol holding 2 days preop. Cleared by medicine/primary care. Neither of [place] feel that the patient needs more aggressive cardiac clearance since she got through the emergency surgery without any  major cardiopulmonary issues and has pretty decent exercise tolerance at this point.  Adin Hector, MD, FACS, MASCRS Esophageal, Gastrointestinal & Colorectal Surgery Robotic and Minimally Invasive Surgery  [facility] [address], [place], [place] [address] [phone] Fax [phone] Main  CONTACT INFORMATION:  Weekday (9AM-5PM): Call [facility] main office at [phone]  Weeknight (5PM-9AM) or Weekend/Holiday: Check [email or web address] (password " [email or web address]") for General Surgery [facility] coverage  (Please, do not use SecureChat as it is not reliable communication to reach operating surgeons for immediate patient care given surgeries/outpatient duties/clinic/cross-coverage/off post-call which would lead to a delay in care.  Epic staff messaging available for outptient concerns, but may not be answered for 48 hours or more).

## 2022-07-09 NOTE — Interval H&P Note (Signed)
History and Physical Interval Note:  07/09/2022 7:20 AM  Michele [name]  has presented today for surgery, with the diagnosis of COLOSTOMY FOR COLON RESECTION. DESIRE FOR OSTOMY TAKEDOWN.  The various methods of treatment have been discussed with the patient and family. After consideration of risks, benefits and other options for treatment, the patient has consented to  Procedure(s) with comments: ROBOTIC OSTOMY TAKEDOWN (N/A) - GEN w/ ERAS PATHWAY LOCAL LYSIS OF ADHESIONS (N/A) RIGID PROCTOSCOPY (N/A) CYSTOSCOPY with FIREFLY INJECTION (N/A) as a surgical intervention.  The patient's history has been reviewed, patient examined, no change in status, stable for surgery.  I have reviewed the patient's chart and labs.  Questions were answered to the patient's satisfaction.    I have re-reviewed the the patient's records, history, medications, and allergies.  I have re-examined the patient.  I again discussed intraoperative plans and goals of post-operative recovery.  The patient agrees to proceed.  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   Depression, recurrent ([place]) 05/07/2022   Rash 05/07/2022   Encounter for annual physical exam 05/07/2022   Panic disorder 01/21/2022   Acute deep vein thrombosis (DVT) of femoral vein ([place]) 01/21/2022   Intra-abdominal abscess ([place]) 12/01/2021   Bowel perforation ([place]) 11/05/2021   S/P TAH (total abdominal hysterectomy) 10/25/2021   Type 2 diabetes mellitus without complication, without long-term current use of insulin  ([place]) 07/02/2020   Vitamin D deficiency 06/29/2020   Hyperlipidemia 06/15/2013    Past Medical History:  Diagnosis Date   Allergy    Anemia    Anxiety    Depression    Hyperlipidemia    Pre-diabetes     Past Surgical History:  Procedure Laterality Date   CESAREAN SECTION     TWO of them   COLOSTOMY Left 11/05/2021   Procedure: COLOSTOMY;  Surgeon: Michele Kava, MD;  Location: [place];  Service: Gynecology;  Laterality: Left;   CYSTOSCOPY  10/25/2021   Procedure: CYSTOSCOPY;  Surgeon: Michele Kava, MD;  Location: [place];  Service: Gynecology;;   dental implants N/A 2016   lower 2 teeth   HYSTERECTOMY ABDOMINAL WITH SALPINGECTOMY  10/25/2021   Procedure: HYSTERECTOMY ABDOMINAL WITH BILATERAL SALPINGECTOMY;  Surgeon: Michele Kava, MD;  Location: [place];  Service: Gynecology;;   HYSTEROSCOPY WITH D & C  01/30/2017   Procedure: DILATATION AND CURETTAGE /HYSTEROSCOPY;  Surgeon: Michele Kava, MD;  Location: [facility];  Service: Gynecology;;   IR CATHETER TUBE CHANGE  12/02/2021   IR GUIDED DRAIN W CATHETER PLACEMENT  12/02/2021   IR RADIOLOGIST EVAL & MGMT  12/17/2021   IR SINUS/FIST TUBE CHK-NON GI  12/02/2021   LAPAROSCOPY  10/25/2021   Procedure: LAPAROSCOPY DIAGNOSTIC;  Surgeon: Michele Kava, MD;  Location: [place];  Service: Gynecology;;   LAPAROTOMY N/A 11/05/2021   Procedure: EXPLORATORY LAPAROTOMY AND COLECTOMY ;  Surgeon: Michele Kava, MD;  Location: [facility];  Service: Gynecology;  Laterality: N/A;   LAPAROTOMY N/A 11/14/2021   Procedure: EXPLORATORY  LAPAROTOMY AND ABDOMINAL WALL CLOSURE;  Surgeon: Michele Luna, MD;  Location: [place];  Service: General;  Laterality: N/A;    Social History  Socioeconomic History   Marital status: Divorced    Spouse name: Not on file   Number of children: Not on file   Years of education: Not on file   Highest education level: Not on file  Occupational History   Not on file  Tobacco Use   Smoking status: Former    Packs/day: 0.50    Years: 15.00    Total  pack years: 7.50    Types: Cigarettes    Quit date: 2003    Years since quitting: 20.9   Smokeless tobacco: Never   Tobacco comments:    quit 2003  Vaping Use   Vaping Use: Never used  Substance and Sexual Activity   Alcohol use: No   Drug use: No   Sexual activity: Yes    Birth control/protection: Surgical    Comment: 2006  Other Topics Concern   Not on file  Social History Narrative   The pt is a homemaker -pt quit smoking cigarettes 2004    Social Determinants of Health   Financial Resource Strain: Not on file  Food Insecurity: Not on file  Transportation Needs: Not on file  Physical Activity: Not on file  Stress: Not on file  Social Connections: Not on file  Intimate Partner Violence: Not on file    Family History  Problem Relation Age of Onset   Colon polyps Mother    Heart attack Father    Hypertension Sister    Colon cancer Neg Hx    Esophageal cancer Neg Hx    Rectal cancer Neg Hx    Stomach cancer Neg Hx     Medications Prior to Admission  Medication Sig Dispense Refill Last Dose   amphetamine-dextroamphetamine (ADDERALL XR) 30 MG 24 hr capsule Take 1 capsule (30 mg total) by mouth daily. 30 capsule 0 07/08/2022   ascorbic acid (VITAMIN C) 500 MG tablet Take 1 tablet (500 mg total) by mouth 2 (two) times daily. (Patient taking differently: Take 500 mg by mouth 3 (three) times a week.) 60 tablet 0 Past Month   citalopram (CELEXA) 20 MG tablet Take 1 tablet (20 mg total) by mouth daily. 90 tablet 3 07/08/2022   clonazePAM (KLONOPIN) 1 MG tablet Take 0.5-1 tablets (0.5-1 mg total) by mouth 2 (two) times daily as needed (severe panic symptoms). (Patient taking differently: Take 1 mg by mouth daily.) 60 tablet 2 07/08/2022   fluticasone (FLONASE) 50 MCG/ACT nasal spray Place 1 spray into both nostrils daily as needed for allergies or rhinitis.   Past Week   melatonin 5 MG TABS Take 5 mg by mouth at bedtime as needed (sleep).   Past Week   Pediatric  Multivitamins-Iron (FLINTSTONES PLUS EXTRA IRON PO) Take 1 tablet by mouth daily.   Past Week   polyethylene glycol (MIRALAX / GLYCOLAX) 17 g packet Take 17 g by mouth 2 (two) times daily as needed. (Patient taking differently: Take 17 g by mouth every evening.) 14 each 0 07/08/2022   rosuvastatin (CRESTOR) 5 MG tablet Take 1 tablet (5 mg total) by mouth daily. 90 tablet 3 Past Week   VITAMIN D, CHOLECALCIFEROL, PO Take 1 capsule by mouth 3 (three) times a week.   Past Month   ferrous sulfate 325 (65 FE) MG tablet Take 1 tablet (325 mg total) by mouth daily with breakfast. (Patient not taking: Reported on 06/27/2022) 30 tablet 0 Not Taking   mineral oil-hydrophilic petrolatum (AQUAPHOR) ointment Apply 1 Application topically as needed for irritation.   More  than a month   mometasone (ELOCON) 0.1 % cream Apply 1 Application topically as needed for itching. (Patient not taking: Reported on 06/27/2022) 45 g 2 More than a month    Current Facility-Administered Medications  Medication Dose Route Frequency Provider Last Rate Last Admin   bisacodyl (DULCOLAX) EC tablet 20 mg  20 mg Oral Once Michele Boston, MD       bupivacaine liposome (EXPAREL) 1.3 % injection 266 mg  20 mL Infiltration Once Michele Boston, MD       cefoTEtan (CEFOTAN) 2 g in sodium chloride 0.9 % 100 mL IVPB  2 g Intravenous On Call to OR Michele Boston, MD       chlorhexidine (PERIDEX) 0.12 % solution 15 mL  15 mL Mouth/Throat Once Suzette Battiest, MD       Or   Oral care mouth rinse  15 mL Mouth Rinse Once Suzette Battiest, MD       Chlorhexidine Gluconate Cloth 2 % PADS 6 each  6 each Topical Once Michele Boston, MD       And   Chlorhexidine Gluconate Cloth 2 % PADS 6 each  6 each Topical Once Michele Boston, MD       feeding supplement (ENSURE PRE-SURGERY) liquid 296 mL  296 mL Oral Once Michele Boston, MD       feeding supplement (ENSURE PRE-SURGERY) liquid 592 mL  592 mL Oral Once Michele Boston, MD       lactated ringers  infusion   Intravenous Continuous Suzette Battiest, MD         Allergies  Allergen Reactions   Atorvastatin Rash and Other (See Comments)    Muscle aches on Lipitor 20 mg and 40 mg     Sulfa Antibiotics Nausea And Vomiting, Other (See Comments) and Rash    BP 115/80   Pulse 76   Temp 98.1 F (36.7 C) (Oral)   Resp 16   Ht 5' (1.524 m)   Wt 70.8 kg   LMP 10/07/2021 (Approximate)   SpO2 98%   BMI 30.47 kg/m   Labs: No results found for this or any previous visit (from the past 48 hour(s)).  Imaging / Studies: No results found.   Adin Hector, M.D., F.A.C.S. Gastrointestinal and Minimally Invasive Surgery [facility] [address], [place], [place] [address] [phone] Main / Paging  07/09/2022 7:20 AM    Adin Hector

## 2022-07-09 NOTE — Op Note (Signed)
Preoperative diagnosis:  1. 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 same  Procedure(s): 1. Cystoscopy with bilateral ureteral catheterization for firefly injection  Surgeon: Dr. Donald Pore  Anesthesia: general  Complications: none  EBL: <1 cc  Intraoperative findings: ureteral catheters passed into ureters easily, ~7.5 cc of firefly injected on each side  Indication: identification of ureters for abdominal/pelvic surgery  Description of procedure:   With appropriate consent having been obtained, the patient was brought to the operative suite. Anesthesia was induced and the patient was prepped and draped in the usual sterile fashion. A timeout was performed  Thereafter the rigid cystoscope was carefully inserted into the bladder. The bladder was inspected thoroughly and no abnormalities were identified.   The right ureteral orifice was identified and cannulated with a 6 fr open ended ureteral catheter. It was easily advanced to the level of the collecting system. The ureteral catheter was then slowly withdrawn while simultaneously injecting the firefly solution over the length of the ureter. Approximately 8 cc total were injected.  The procedure was then repeated in an identical fashion on the left.   A 16 fr foley was then placed in a sterile fashion with immediate return of clear blue/green urine, and the balloon was inflated with 10 cc sterile water.   Donald Pore MD 07/09/2022, 9:02 AM  [facility]  Pager: [phone]

## 2022-07-09 NOTE — Anesthesia Procedure Notes (Signed)
Procedure Name: Intubation Date/Time: 07/09/2022 8:41 AM  Performed by: Claudia Desanctis, CRNAPre-anesthesia Checklist: Patient identified, Emergency Drugs available, Suction available and Patient being monitored Patient Re-evaluated:Patient Re-evaluated prior to induction Oxygen Delivery Method: Circle system utilized Preoxygenation: Pre-oxygenation with 100% oxygen Induction Type: IV induction Ventilation: Mask ventilation without difficulty Laryngoscope Size: 2 and Miller Grade View: Grade I Tube type: Oral Tube size: 7.0 mm Number of attempts: 1 Airway Equipment and Method: Stylet Placement Confirmation: ETT inserted through vocal cords under direct vision, positive ETCO2 and breath sounds checked- equal and bilateral Secured at: 21 cm Tube secured with: Tape Dental Injury: Teeth and Oropharynx as per pre-operative assessment

## 2022-07-09 NOTE — Discharge Instructions (Signed)
SURGERY: POST OP INSTRUCTIONS (Surgery for small bowel obstruction, colon resection, etc)   ######################################################################  EAT Gradually transition to a high fiber diet with a fiber supplement over the next few days after discharge  WALK Walk an hour a day.  Control your pain to do that.    CONTROL PAIN Control pain so that you can walk, sleep, tolerate sneezing/coughing, go up/down stairs.  HAVE A BOWEL MOVEMENT DAILY Keep your bowels regular to avoid problems.  OK to try a laxative to override constipation.  OK to use an antidairrheal to slow down diarrhea.  Call if not better after 2 tries  CALL IF YOU HAVE PROBLEMS/CONCERNS Call if you are still struggling despite following these instructions. Call if you have concerns not answered by these instructions  ######################################################################   DIET Follow a light diet the first few days at home.  Start with a bland diet such as soups, liquids, starchy foods, low fat foods, etc.  If you feel full, bloated, or constipated, stay on a ful liquid or pureed/blenderized diet for a few days until you feel better and no longer constipated. Be sure to drink plenty of fluids every day to avoid getting dehydrated (feeling dizzy, not urinating, etc.). Gradually add a fiber supplement to your diet over the next week.  Gradually get back to a regular solid diet.  Avoid fast food or heavy meals the first week as you are more likely to get nauseated. It is expected for your digestive tract to need a few months to get back to normal.  It is common for your bowel movements and stools to be irregular.  You will have occasional bloating and cramping that should eventually fade away.  Until you are eating solid food normally, off all pain medications, and back to regular activities; your bowels will not be normal. Focus on eating a low-fat, high fiber diet the rest of your life  (See Getting to Good Bowel Health, below).  CARE of your INCISION or WOUND  It is good for closed incisions and even open wounds to be washed every day.  Shower every day.  Short baths are fine.  Wash the incisions and wounds clean with soap & water.    You may leave closed incisions open to air if it is dry.   You may cover the incision with clean gauze & replace it after your daily shower for comfort.  TEGADERM & WICKS:  You have clear gauze band-aid dressings over your closed incision(s).  Remove the dressings & shoelace ribbon wicks in your largest incision 3 days after surgery.    If you have an open wound with a wound vac, see wound vac care instructions.    ACTIVITIES as tolerated Start light daily activities --- self-care, walking, climbing stairs-- beginning the day after surgery.  Gradually increase activities as tolerated.  Control your pain to be active.  Stop when you are tired.  Ideally, walk several times a day, eventually an hour a day.   Most people are back to most day-to-day activities in a few weeks.  It takes 4-8 weeks to get back to unrestricted, intense activity. If you can walk 30 minutes without difficulty, it is safe to try more intense activity such as jogging, treadmill, bicycling, low-impact aerobics, swimming, etc. Save the most intensive and strenuous activity for last (Usually 4-8 weeks after surgery) such as sit-ups, heavy lifting, contact sports, etc.  Refrain from any intense heavy lifting or straining until you are off narcotics   for pain control.  You will have off days, but things should improve week-by-week. DO NOT PUSH THROUGH PAIN.  Let pain be your guide: If it hurts to do something, don't do it.  Pain is your body warning you to avoid that activity for another week until the pain goes down. You may drive when you are no longer taking narcotic prescription pain medication, you can comfortably wear a seatbelt, and you can safely make sudden turns/stops to  protect yourself without hesitating due to pain. You may have sexual intercourse when it is comfortable. If it hurts to do something, stop.  MEDICATIONS Take your usually prescribed home medications unless otherwise directed.   Blood thinners:  Usually you can restart any strong blood thinners after the second postoperative day.  It is OK to take aspirin right away.     If you are on strong blood thinners (warfarin/Coumadin, Plavix, Xerelto, Eliquis, Pradaxa, etc), discuss with your surgeon, medicine PCP, and/or cardiologist for instructions on when to restart the blood thinner & if blood monitoring is needed (PT/INR blood check, etc).     PAIN CONTROL Pain after surgery or related to activity is often due to strain/injury to muscle, tendon, nerves and/or incisions.  This pain is usually short-term and will improve in a few months.  To help speed the process of healing and to get back to regular activity more quickly, DO THE FOLLOWING THINGS TOGETHER: Increase activity gradually.  DO NOT PUSH THROUGH PAIN Use Ice and/or Heat Try Gentle Massage and/or Stretching Take over the counter pain medication Take Narcotic prescription pain medication for more severe pain  Good pain control = faster recovery.  It is better to take more medicine to be more active than to stay in bed all day to avoid medications.  Increase activity gradually Avoid heavy lifting at first, then increase to lifting as tolerated over the next 6 weeks. Do not "push through" the pain.  Listen to your body and avoid positions and maneuvers than reproduce the pain.  Wait a few days before trying something more intense Walking an hour a day is encouraged to help your body recover faster and more safely.  Start slowly and stop when getting sore.  If you can walk 30 minutes without stopping or pain, you can try more intense activity (running, jogging, aerobics, cycling, swimming, treadmill, sex, sports, weightlifting,  etc.) Remember: If it hurts to do it, then don't do it! Use Ice and/or Heat You will have swelling and bruising around the incisions.  This will take several weeks to resolve. Ice packs or heating pads (6-8 times a day, 30-60 minutes at a time) will help sooth soreness & bruising. Some people prefer to use ice alone, heat alone, or alternate between ice & heat.  Experiment and see what works best for you.  Consider trying ice for the first few days to help decrease swelling and bruising; then, switch to heat to help relax sore spots and speed recovery. Shower every day.  Short baths are fine.  It feels good!  Keep the incisions and wounds clean with soap & water.   Try Gentle Massage and/or Stretching Massage at the area of pain many times a day Stop if you feel pain - do not overdo it Take over the counter pain medication This helps the muscle and nerve tissues become less irritable and calm down faster Choose ONE of the following over-the-counter anti-inflammatory medications: Acetaminophen 500mg tabs (Tylenol) 1-2 pills with every meal and   just before bedtime (avoid if you have liver problems or if you have acetaminophen in you narcotic prescription) Naproxen 220mg tabs (ex. Aleve, Naprosyn) 1-2 pills twice a day (avoid if you have kidney, stomach, IBD, or bleeding problems) Ibuprofen 200mg tabs (ex. Advil, Motrin) 3-4 pills with every meal and just before bedtime (avoid if you have kidney, stomach, IBD, or bleeding problems) Take with food/snack several times a day as directed for at least 2 weeks to help keep pain / soreness down & more manageable. Take Narcotic prescription pain medication for more severe pain A prescription for strong pain control is often given to you upon discharge (for example: oxycodone/Percocet, hydrocodone/Norco/Vicodin, or tramadol/Ultram) Take your pain medication as prescribed. Be mindful that most narcotic prescriptions contain Tylenol (acetaminophen) as well -  avoid taking too much Tylenol. If you are having problems/concerns with the prescription medicine (does not control pain, nausea, vomiting, rash, itching, etc.), please call us [phone] to see if we need to switch you to a different pain medicine that will work better for you and/or control your side effects better. If you need a refill on your pain medication, you must call the office before 4 pm and on weekdays only.  By federal law, prescriptions for narcotics cannot be called into a pharmacy.  They must be filled out on paper & picked up from our office by the patient or authorized caretaker.  Prescriptions cannot be filled after 4 pm nor on weekends.    WHEN TO CALL US [phone] Severe uncontrolled or worsening pain  Fever over 101 F (38.5 C) Concerns with the incision: Worsening pain, redness, rash/hives, swelling, bleeding, or drainage Reactions / problems with new medications (itching, rash, hives, nausea, etc.) Nausea and/or vomiting Difficulty urinating Difficulty breathing Worsening fatigue, dizziness, lightheadedness, blurred vision Other concerns If you are not getting better after two weeks or are noticing you are getting worse, contact our office [phone] for further advice.  We may need to adjust your medications, re-evaluate you in the office, send you to the emergency room, or see what other things we can do to help. The clinic staff is available to answer your questions during regular business hours (8:30am-5pm).  Please don't hesitate to call and ask to speak to one of our nurses for clinical concerns.    A surgeon from [facility] is always on call at the hospitals 24 hours/day If you have a medical emergency, go to the nearest emergency room or call 911.  FOLLOW UP in our office One the day of your discharge from the hospital (or the next business weekday), please call [facility] to set up or confirm an appointment to see your  surgeon in the office for a follow-up appointment.  Usually it is 2-3 weeks after your surgery.   If you have skin staples at your incision(s), let the office know so we can set up a time in the office for the nurse to remove them (usually around 10 days after surgery). Make sure that you call for appointments the day of discharge (or the next business weekday) from the hospital to ensure a convenient appointment time. IF YOU HAVE DISABILITY OR FAMILY LEAVE FORMS, BRING THEM TO THE OFFICE FOR PROCESSING.  DO NOT GIVE THEM TO YOUR DOCTOR.  [facility] [address], [address], [place], [place]  [address] ? [phone] - Main [phone] - Toll Free,  [phone] - Fax [email or web address]      GETTING TO GOOD BOWEL HEALTH. It is expected for your digestive tract to need a few months to get back to normal.  It is common for your bowel movements and stools to be irregular.  You will have occasional bloating and cramping that should eventually fade away.  Until you are eating solid food normally, off all pain medications, and back to regular activities; your bowels will not be normal.   Avoiding constipation The goal: ONE SOFT BOWEL MOVEMENT A DAY!    Drink plenty of fluids.  Choose water first. TAKE A FIBER SUPPLEMENT EVERY DAY THE REST OF YOUR LIFE During your first week back home, gradually add back a fiber supplement every day Experiment which form you can tolerate.   There are many forms such as powders, tablets, wafers, gummies, etc Psyllium bran (Metamucil), methylcellulose (Citrucel), Miralax or Glycolax, Benefiber, Flax Seed.  Adjust the dose week-by-week (1/2 dose/day to 6 doses a day) until you are moving your bowels 1-2 times a day.  Cut back the dose or try a different fiber product if it is giving you problems such as diarrhea or bloating. Sometimes a laxative is needed to help jump-start bowels if constipated until the fiber supplement can help  regulate your bowels.  If you are tolerating eating & you are farting, it is okay to try a gentle laxative such as double dose MiraLax, prune juice, or Milk of Magnesia.  Avoid using laxatives too often. Stool softeners can sometimes help counteract the constipating effects of narcotic pain medicines.  It can also cause diarrhea, so avoid using for too long. If you are still constipated despite taking fiber daily, eating solids, and a few doses of laxatives, call our office. Controlling diarrhea Try drinking liquids and eating bland foods for a few days to avoid stressing your intestines further. Avoid dairy products (especially milk & ice cream) for a short time.  The intestines often can lose the ability to digest lactose when stressed. Avoid foods that cause gassiness or bloating.  Typical foods include beans and other legumes, cabbage, broccoli, and dairy foods.  Avoid greasy, spicy, fast foods.  Every person has some sensitivity to other foods, so listen to your body and avoid those foods that trigger problems for you. Probiotics (such as active yogurt, Align, etc) may help repopulate the intestines and colon with normal bacteria and calm down a sensitive digestive tract Adding a fiber supplement gradually can help thicken stools by absorbing excess fluid and retrain the intestines to act more normally.  Slowly increase the dose over a few weeks.  Too much fiber too soon can backfire and cause cramping & bloating. It is okay to try and slow down diarrhea with a few doses of antidiarrheal medicines.   Bismuth subsalicylate (ex. Kayopectate, Pepto Bismol) for a few doses can help control diarrhea.  Avoid if pregnant.   Loperamide (Imodium) can slow down diarrhea.  Start with one tablet (2mg) first.  Avoid if you are having fevers or severe pain.  ILEOSTOMY PATIENTS WILL HAVE CHRONIC DIARRHEA since their colon is not in use.    Drink plenty of liquids.  You will need to drink even more glasses of  water/liquid a day to avoid getting dehydrated. Record output from your ileostomy.  Expect to empty the bag every 3-4 hours at first.  Most people with a permanent ileostomy empty their bag 4-6 times at the least.   Use antidiarrheal medicine (especially Imodium) several times a day to avoid getting dehydrated.    Start with a dose at bedtime & breakfast.  Adjust up or down as needed.  Increase antidiarrheal medications as directed to avoid emptying the bag more than 8 times a day (every 3 hours). Work with your wound ostomy nurse to learn care for your ostomy.  See ostomy care instructions. TROUBLESHOOTING IRREGULAR BOWELS 1) Start with a soft & bland diet. No spicy, greasy, or fried foods.  2) Avoid gluten/wheat or dairy products from diet to see if symptoms improve. 3) Miralax 17gm or flax seed mixed in 8oz. water or juice-daily. May use 2-4 times a day as needed. 4) Gas-X, Phazyme, etc. as needed for gas & bloating.  5) Prilosec (omeprazole) over-the-counter as needed 6)  Consider probiotics (Align, Activa, etc) to help calm the bowels down  Call your doctor if you are getting worse or not getting better.  Sometimes further testing (cultures, endoscopy, X-ray studies, CT scans, bloodwork, etc.) may be needed to help diagnose and treat the cause of the diarrhea. [facility] [address], [address], [place], [place]  [address] [phone] - Main.    [phone]  - Toll Free.   [phone] - Fax [email or web address]  

## 2022-07-09 NOTE — Op Note (Addendum)
07/09/2022  12:45 PM  PATIENT:  Michele [name]  56 y.o. female  Patient Care Team: Early, Coralee Pesa, NP as PCP - General (Nurse Practitioner) Michael Boston, MD as Consulting Physician (Colon and Rectal Surgery) Georganna Skeans, MD as Consulting Physician (General Surgery) Sanjuana Kava, MD (Inactive) as Referring Physician (Obstetrics and Gynecology)  PRE-OPERATIVE DIAGNOSIS:  COLOSTOMY FOR COLON RESECTION. DESIRE FOR OSTOMY TAKEDOWN  POST-OPERATIVE DIAGNOSIS:   COLOSTOMY FOR COLON RESECTION. DESIRE FOR OSTOMY TAKEDOWN INCISIONAL HERNIA WITH DIASTASIS RECTI  PROCEDURE:  ROBOTIC COLOSTOMY TAKEDOWN ROBOTIC AND LAPAROSCOPIC LYSIS OF ADHESIONS X 2.5 HOURS  TRANSVERSUS ABDOMINIS PLANE (TAP) BLOCK - BILATERAL RIGID PROCTOSCOPY  SURGEON:  Adin Hector, MD  ASSISTANT: Leighton Ruff, MD, FACS, FASCRS An experienced assistant was required given the standard of surgical care given the complexity of the case.  This assistant was needed for exposure, dissection, suction, tissue approximation, retraction, perception, etc.  ANESTHESIA:     General  Regional TRANSVERSUS ABDOMINIS PLANE (TAP) nerve block for perioperative & postoperative pain control provided with liposomal bupivacaine (Experel) mixed with 0.25% bupivacaine as a Bilateral TAP block x 108m each side at the level of the transverse abdominis & preperitoneal spaces along the flank at the anterior axillary line, from subcostal ridge to iliac crest under laparoscopic guidance   Local field block at port sites & extraction wound  EBL:  Total I/O In: 1800 [I.V.:1700; IV Piggyback:100] Out: 175 [Urine:150; Blood:25]  Delay start of Pharmacological VTE agent (>24hrs) due to surgical blood loss or risk of bleeding:  no  DRAINS: No  SPECIMEN:   END COLOSTOMY  DISPOSITION OF SPECIMEN:  PATHOLOGY  COUNTS:  YES  PLAN OF CARE: Admit to inpatient   PATIENT DISPOSITION:  PACU - hemodynamically stable.  INDICATION: Patient who  underwent laparoscopic converted to open lysis adhesions and abdominal hysterectomy/salpingectomy in March.  Required emergent operation for peritonitis with massive feculent and purulent peritonitis postop day 10.  Hartmann resection done.  Pathology consistent with full-thickness colon perforation no diverticulitis.  Patient had to be reoperated on a week later due to fascial dehiscence and reabscess formation.  Eventually stabilized and discharged in late April.  Wounds have mostly closed down.  Colonoscopy showed no stricturing mass or malignancy.  Request made for colostomy takedown.  Known probable large incisional hernia versus diastases.  I recommended minimally invasive possible open lyse lesions and takedown ostomy.  The anatomy & physiology of the digestive tract was discussed.  The pathophysiology was discussed.  Possibility of remaining with an ostomy permanently was discussed.  I offered ostomy takedown.  Laparoscopic & open techniques were discussed.   Risks such as bleeding, infection, abscess, leak, reoperation, possible re-ostomy, injury to other organs, need for repair of tissues / organs, need for further treatment, hernia, heart attack, death, and other risks were discussed.   I noted a good likelihood this will help address the problem.  Goals of post-operative recovery were discussed as well.  We will work to minimize complications.  Questions were answered.  The patient expresses understanding & wishes to proceed with surgery.   OR FINDINGS:   Patient had extensive intra-abdominal adhesions of omentum colon and small bowel to anterior abdominal wall and interloop adhesions.  Patient had a very large midline incisional hernia 18 x 8 cm in the setting of diastases & obesity.  Retraction of rectus muscle laterally with loss of domain right greater than left.  Chronic midline granulation wound 751mdeep in subcutaneous tissue not contiguous with  peritoneum.  No foreign body suture near  the region.    Patient has a isoperistaltic side proximal sigmoid to rectosigmoid colon stump anastomosis.   About a 25 cm from anal verge.   CASE DATA:  Type of patient?: Elective [facility] Private Case  Status of Case? Elective Scheduled  Infection Present At Time Of Surgery (PATOS)?  NO  DESCRIPTION:   Informed consent was confirmed.  The patient underwent general anaesthesia without difficulty.  The patient was positioned appropriately.  VTE prevention in place.  Dr. Luna Glasgow with [facility] did cystoscopy with ICG firefly injection of bladder and ureters for identification given history of prior peritonitis and hostile abdomen.  Please see his separate operative note.  The patient was clipped, prepped, & draped in a sterile fashion.  Surgical timeout confirmed our plan.  The patient was positioned in reverse Trendelenburg.  Abdominal entry was gained using Varess technique at the left subcostal ridge on the anterior abdominal wall.  No elevated EtCO2 noted.  Port placed.  Camera inspection revealed no injury.  Patient had very dense adhesions on most the anterior surface in the abdominal wall.  I was able to place a 5 mm port in the right flank.  Did some focused sharp and laparoscopic dissection to gradually place extra ports under direct laparoscopic visualization.  This took some time to be safe.  The patient was carefully positioned.  The Intuitive daVinci robot was docked with camera & instruments carefully placed.  Continued lysis of adhesions.  Freed greater omentum off the upper anterior abdominal wall partially.  Patient had a giant incisional hernia that was open contiguous with bowel going up into it.  Actually freed off loops of small bowel off the giant hernia sac to get that reduced down.  Work to free off numerous small bowel loops off of the right and anterior pelvis.  Was able to identify malignant Prolene sutures consistent with the rectosigmoid stump.  Carefully freed small  bowel off of this.  Was able to identify the colostomy in the left lower quadrant.  No parastomal hernia there.  Freed off adhesions around circumferentially.  We mobilized the descending colon in a lateral medial fashion.  I then focused down in pelvis.  I did rigid proctoscopy and encountered some retained stool balls around 10-15 cm.  Mainly waxy mucus.  Was able to evacuate those.  Could get the rectosigmoid straightened out and get 24m blunt EEA sizer to pass up above the peritoneal reflection to the sacral promontory.  Irritation and somewhat narrowed at the peritoneal reflection.  However there have been no evidence of any mass or stricture by flexible sigmoidoscopy by Dr. MRush Landmarknor by my examination today.  However given the mild narrowing and irritation did not know if an EEA would be a great idea   to fully mobilize the rectosigmoid stump to help straighten out.  Could see the ureters and kept those in the retroperitoneal position.  There were very dense adhesions at the site of the prior hysterectomy on the vaginal cuff and adnexa.  Fully freed off and straighten things out.  Confirmed a giant midline incisional hernia without any breach through the skin or leak.  Not feel was a good idea to attempt repair of this at the time of colostomy takedown.  Then focused on freeing interloop adhesions of small bowel.  Worked toward the ileocecal region and identified the terminal ileum as it entered the cecum.  Ran the small bowel more proximally and  freed off dense interloop adhesions until I came to the ligament of Treitz.  Ran the bowel completely 3 times to confirm no serosal injury or other abnormality.  Dr. Marcello Moores agreed.    Since there is no evidence of diverticulitis on pathology nor prior history of this and her pelvis was somewhat fixed, I felt it was reasonable to go ahead and do a side-to-side anastomosis since that plenty of rectosigmoid colon left to avoid further colon resection.  We had  proven no internal stricturing or mass by flex sig by Dr. Rush Landmark and rigid proctoscopy by myself.  Did not like the idea of trying to irritate and stressed out the rectum by trying to do an EEA anastomosis.  Dr. Marcello Moores agreed.  We carefully undocked the robot.  I freed the end colostomy from its attachments to the anterior abdominal wall through a circular dermal incision and through the subcutaneous tissue and feeding off the fascia.  Placed a wound protector.  Good mobility.  Stapled off a region at the proximal sigmoid colon 5 cm proximal to the end ostomy where there was good healthy tissue without adhesions.  Stapled at region off with a 75 GIA stapler. Transected the mesentery with clamp and silk suture ligature to good result.  Had good viability.   Rectosigmoid colon would not reach up into the parastomal wound so decided to do an intracorporeal anastomosis.  Patient was repositioned and the robot was docked.  We inspected the bowel & peritoneum and confirmed no injury.  Read off adhesions between the descending/sigmoid colon mesentery and the rectosigmoid mesentery to allow them to lay side-to-side in an isoperistaltic fashion.  I did a side-to-side stapled anastomosis of proximal sigmoid colon to rectosigmoid colon using a 91m blue load in an isoperistaltic fashion.  (Sigmoid colon to proximal rectosigmoid stump colon for the proximal end of the anastomosis.  Distal end of sigmoid colon staple line to more distal rectosigmoid colon for the distal end of the anastomosis).  Confirm good stapling with viable mucosa and no bleeding.  I sewed the common staple channel wound with an absorbable suture (V-lock) in a running [place] from each corner and meeting in the center.  I did meticulous inspection prove an airtight closure.  I went down and did insufflation by rigid proctoscopy with the descending colon clamped and lower abdominal/pelvic irrigation.  In good distention with an airtight seal  consistent with a negative air leak test.  The omentum had been fixed in the upper abdomen was left in place so I did not attempt to do omentopexy.   Endoluminal gas was evacuated.  Ports & wound protector removed.   We aspirated the sterile irrigation.  Hemostasis was good.  Sterile unused instruments were used from this point.  I closed the skin at the port sites using Monocryl stitch and sterile dressing.  We assured hemostasis and the former ostomy wound.  Wound irrigated.  Anterior rectus fascia was closed using #1 PDS vertically. Skin edges trimmed to remove excess scarring and have healthy edges.    I excised redundant tissue to have a transverse biconcave wound.  I closed this with interrupted suture Vicryl at [facility] and deep dermal 4-0 monocryl sutures.  Placed umbilical tape wicks x2 in the wound to allow deeper drainage..  Sterile dressing placed.   Patient is being extubated go to recovery room. I had discussed postop care with the patient in detail the office & in the holding area. Instructions are written. I  discussed operative findings, updated the patient's status, discussed probable steps to recovery, and gave postoperative recommendations to the patient's ex husband, Katiana Ruland, per her wishes .  Recommendations were made.  Questions were answered.  He expressed understanding & appreciation.  Adin Hector, M.D., F.A.C.S. Gastrointestinal and Minimally Invasive Surgery [facility] [address], [place], [place] [address] [phone] Main / Paging

## 2022-07-10 ENCOUNTER — Encounter (HOSPITAL_COMMUNITY): Payer: Self-pay | Admitting: Surgery

## 2022-07-10 LAB — BASIC METABOLIC PANEL
Anion gap: 9 (ref 5–15)
BUN: 14 mg/dL (ref 6–20)
CO2: 23 mmol/L (ref 22–32)
Calcium: 8.2 mg/dL — ABNORMAL LOW (ref 8.9–10.3)
Chloride: 104 mmol/L (ref 98–111)
Creatinine, Ser: 1.15 mg/dL — ABNORMAL HIGH (ref 0.44–1.00)
GFR, Estimated: 56 mL/min — ABNORMAL LOW (ref >60)
Glucose, Bld: 123 mg/dL — ABNORMAL HIGH (ref 70–99)
Potassium: 4.1 mmol/L (ref 3.5–5.1)
Sodium: 136 mmol/L (ref 135–145)

## 2022-07-10 LAB — CBC
HCT: 36.1 % (ref 36.0–46.0)
Hemoglobin: 11.1 g/dL — ABNORMAL LOW (ref 12.0–15.0)
MCH: 25.3 pg — ABNORMAL LOW (ref 26.0–34.0)
MCHC: 30.7 g/dL (ref 30.0–36.0)
MCV: 82.2 fL (ref 80.0–100.0)
Platelets: 320 10*3/uL (ref 150–400)
RBC: 4.39 MIL/uL (ref 3.87–5.11)
RDW: 17.1 % — ABNORMAL HIGH (ref 11.5–15.5)
WBC: 9 10*3/uL (ref 4.0–10.5)
nRBC: 0 % (ref 0.0–0.2)

## 2022-07-10 LAB — SURGICAL PATHOLOGY

## 2022-07-10 LAB — MAGNESIUM: Magnesium: 2 mg/dL (ref 1.7–2.4)

## 2022-07-10 NOTE — Progress Notes (Signed)
  Transition of Care [facility]) Screening Note   Patient Details  Name: Michele [name] Date of Birth: [birth year]   Transition of Care [facility]) CM/SW Contact:    Vassie Moselle, LCSW Phone Number: 07/10/2022, 9:51 AM    Transition of Care Department [facility]) has reviewed patient and no TOC needs have been identified at this time. We will continue to monitor patient advancement through interdisciplinary progression rounds. If new patient transition needs arise, please place a TOC consult.

## 2022-07-10 NOTE — Progress Notes (Signed)
Michele [name] [id number] 10-06-1965  CARE TEAM:  PCP: Orma Render, NP  Outpatient Care Team: Patient Care Team: Early, Coralee Pesa, NP as PCP - General (Nurse Practitioner) Michael Boston, MD as Consulting Physician (Colon and Rectal Surgery) Georganna Skeans, MD as Consulting Physician (General Surgery) Sanjuana Kava, MD (Inactive) as Referring Physician (Obstetrics and Gynecology) Mansouraty, Telford Nab., MD as Consulting Physician (Gastroenterology)  Inpatient Treatment Team: Treatment Team: Attending Provider: Michael Boston, MD; Registered Nurse: Philomena Course, RN; Registered Nurse: Kerney Elbe, RN; Pharmacist: Lenis Noon, [facility]   Problem List:   Principal Problem:   Delayed perforation of colon s/p [facility] colectomy/ostomy March 20213 Active Problems:   Hyperlipidemia   Type 2 diabetes mellitus without complication, without long-term current use of insulin ([place])   Vitamin D deficiency   Panic disorder   Acute deep vein thrombosis (DVT) of femoral vein (HCC)   Attention deficit hyperactivity disorder (ADHD), predominantly inattentive type   Incisional hernia, without obstruction or gangrene   Status post Hartmann's procedure ([place])   1 Day Post-Op  07/09/2022  POST-OPERATIVE DIAGNOSIS:   COLOSTOMY FOR COLON RESECTION. DESIRE FOR OSTOMY TAKEDOWN INCISIONAL HERNIA WITH DIASTASIS RECTI   PROCEDURE:  ROBOTIC COLOSTOMY TAKEDOWN ROBOTIC AND LAPAROSCOPIC LYSIS OF ADHESIONS X 2.5 HOURS  TRANSVERSUS ABDOMINIS PLANE (TAP) BLOCK - BILATERAL RIGID PROCTOSCOPY   SURGEON:  Adin Hector, MD  OR FINDINGS:    Patient had extensive intra-abdominal adhesions of omentum colon and small bowel to anterior abdominal wall and interloop adhesions.  Patient had a very large midline incisional hernia 18 x 8 cm in the setting of diastases & obesity.  Retraction of rectus muscle laterally with loss of domain right greater than left.  Chronic midline granulation wound 23m deep in  subcutaneous tissue not contiguous with peritoneum.  No foreign body suture near the region.     Patient has a isoperistaltic side proximal sigmoid to rectosigmoid colon stump anastomosis.   About a 25 cm from anal verge.    Assessment  Recovering relative well.  [facility]  Plan:  Enhanced recovery protocol.  Continue abdominal binder with ice and heat and scheduled Tylenol.  Low-dose gabapentin.  Stronger medications needed to mobilize.  Tolerating clears without nausea.  Advance to dysphagia 1/pured and possible soft this evening.  Creatinine only mildly elevated but electrolytes stable.  Follow.  Follow if IV fluids appear in backup.  Hemoglobin low but stable.  Continue dressing for now.  Most likely remove wicks and partially closed ostomy wound by postop day 3 or sooner if leaving.  Periumbilical midline chronic abdominal wound seems rather superficial and short.  Cover and protect for now.  Try to avoid packing anymore.  Hopefully will seal up.  She does have quite a large incisional hernia.  To repair this would require component separation with probable bilateral TAR release with large retrorectus mesh reconstruction and excision of chronic midline hernia sac/scar.  I would want to wait at least 3 months from discharge from colectomy for even considering that.  May discuss with my colleagues that also do abdominal reconstructions.  Otherwise hold off if she is not severely symptomatic.  VTE prophylaxis- SCDs, etc  mobilize as tolerated to help recovery  Disposition:  Disposition:  The patient is from: Home  Anticipate discharge to:  Home  Anticipated Date of Discharge is:  December 2,2023    Barriers to discharge:  Pending Clinical improvement (more likely than not)  Patient  currently is NOT MEDICALLY STABLE for discharge from the hospital from a surgery standpoint.      I reviewed nursing notes, last 24 h vitals and pain scores, last 48 h  intake and output, last 24 h labs and trends, and last 24 h imaging results. I have reviewed this patient's available data, including medical history, events of note, test results, etc as part of my evaluation.  A significant portion of that time was spent in counseling.  Care during the described time interval was provided by me.  This care required moderate level of medical decision making.  07/10/2022    Subjective: (Chief complaint)  Patient with some soreness but able to mobilize in hallways last night.  Needed Dilaudid x 1.  Tolerated clear liquids without nausea.  Ex-husband/friend at bedside.  Objective:  Vital signs:  Vitals:   07/09/22 2132 07/10/22 0118 07/10/22 0500 07/10/22 0522  BP: 118/86 114/73  101/70  Pulse: 85 88  88  Resp: '17 17  18  '$ Temp: 98.4 F (36.9 C) 98.7 F (37.1 C)  98.3 F (36.8 C)  TempSrc: Oral Oral  Oral  SpO2: 96% 93%  97%  Weight:   74.9 kg   Height:        Last BM Date : 07/09/22  Intake/Output   Yesterday:  11/29 0701 - 11/30 0700 In: 3306.5 [P.O.:150; I.V.:3056.5; IV Piggyback:100] Out: 1875 [Urine:1850; Blood:25] This shift:  No intake/output data recorded.  Bowel function:  Flatus: No  BM:  No  Drain: (No drain)   Physical Exam:  General: Pt awake/alert in no acute distress Eyes: PERRL, normal EOM.  Sclera clear.  No icterus Neuro: CN II-XII intact w/o focal sensory/motor deficits. Lymph: No head/neck/groin lymphadenopathy Psych:  No delerium/psychosis/paranoia.  Oriented x 4 HENT: Normocephalic, Mucus membranes moist.  No thrush Neck: Supple, No tracheal deviation.  No obvious thyromegaly Chest: No pain to chest wall compression.  Good respiratory excursion.  No audible wheezing CV:  Pulses intact.  Regular rhythm.  No major extremity edema MS: Normal AROM mjr joints.  No obvious deformity  Abdomen: Soft.  Nondistended.  Mildly tender at incisions only.  No evidence of peritonitis.  No incarcerated  hernias.  Ext:  No deformity.  No mjr edema.  No cyanosis Skin: No petechiae / purpurea.  No major sores.  Warm and dry    Results:   Cultures: No results found for this or any previous visit (from the past 720 hour(s)).  Labs: Results for orders placed or performed during the hospital encounter of 07/09/22 (from the past 48 hour(s))  Basic metabolic panel     Status: Abnormal   Collection Time: 07/10/22  4:20 AM  Result Value Ref Range   Sodium 136 135 - 145 mmol/L   Potassium 4.1 3.5 - 5.1 mmol/L   Chloride 104 98 - 111 mmol/L   CO2 23 22 - 32 mmol/L   Glucose, Bld 123 (H) 70 - 99 mg/dL    Comment: Glucose reference range applies only to samples taken after fasting for at least 8 hours.   BUN 14 6 - 20 mg/dL   Creatinine, Ser 1.15 (H) 0.44 - 1.00 mg/dL   Calcium 8.2 (L) 8.9 - 10.3 mg/dL   GFR, Estimated 56 (L) >60 mL/min    Comment: (NOTE) Calculated using the CKD-EPI Creatinine Equation (2021)    Anion gap 9 5 - 15    Comment: Performed at [facility], [address] [place], [place]  [address]  CBC     Status: Abnormal   Collection Time: 07/10/22  4:20 AM  Result Value Ref Range   WBC 9.0 4.0 - 10.5 K/uL   RBC 4.39 3.87 - 5.11 MIL/uL   Hemoglobin 11.1 (L) 12.0 - 15.0 g/dL   HCT 36.1 36.0 - 46.0 %   MCV 82.2 80.0 - 100.0 fL   MCH 25.3 (L) 26.0 - 34.0 pg   MCHC 30.7 30.0 - 36.0 g/dL   RDW 17.1 (H) 11.5 - 15.5 %   Platelets 320 150 - 400 K/uL   nRBC 0.0 0.0 - 0.2 %    Comment: Performed at [facility], [place] [address], [place], [place] [address]  Magnesium     Status: None   Collection Time: 07/10/22  4:20 AM  Result Value Ref Range   Magnesium 2.0 1.7 - 2.4 mg/dL    Comment: Performed at [facility], [place] [address] [place], [place] [address]    Imaging / Studies: No results found.  Medications / Allergies: per chart  Antibiotics: Anti-infectives (From admission, onward)    Start     Dose/Rate  Route Frequency Ordered Stop   07/09/22 2100  cefoTEtan (CEFOTAN) 2 g in sodium chloride 0.9 % 100 mL IVPB        2 g 200 mL/hr over 30 Minutes Intravenous Every 12 hours 07/09/22 1407 07/10/22 0450   07/09/22 1400  neomycin (MYCIFRADIN) tablet 1,000 mg  Status:  Discontinued       See Hyperspace for full Linked Orders Report.   1,000 mg Oral 3 times per day 07/09/22 0635 07/09/22 0641   07/09/22 1400  metroNIDAZOLE (FLAGYL) tablet 1,000 mg  Status:  Discontinued       See Hyperspace for full Linked Orders Report.   1,000 mg Oral 3 times per day 07/09/22 0635 07/09/22 0641   07/09/22 0645  cefoTEtan (CEFOTAN) 2 g in sodium chloride 0.9 % 100 mL IVPB        2 g 200 mL/hr over 30 Minutes Intravenous On call to O.R. 07/09/22 0635 07/09/22 1900         Note: Portions of this report may have been transcribed using voice recognition software. Every effort was made to ensure accuracy; however, inadvertent computerized transcription errors may be present.   Any transcriptional errors that result from this process are unintentional.    Adin Hector, MD, FACS, MASCRS Esophageal, Gastrointestinal & Colorectal Surgery Robotic and Minimally Invasive Surgery  [facility] [address] [place], [place] [address] [phone] Fax [phone] Main  CONTACT INFORMATION:  Weekday (9AM-5PM): Call [facility] main office at [phone]  Weeknight (5PM-9AM) or Weekend/Holiday: Check [email or web address] (password " [email or web address]") for General Surgery [facility] coverage  (Please, do not use SecureChat as it is not reliable communication to reach operating surgeons for immediate patient care given surgeries/outpatient duties/clinic/cross-coverage/off post-call which would lead to a delay in care.  Epic staff messaging available for outptient concerns, but may not be answered for 48 hours or more).     07/10/2022  7:43 AM

## 2022-07-10 NOTE — Progress Notes (Signed)
Mobility Specialist - Progress Note   07/10/22 1320  Mobility  Activity Ambulated with assistance in hallway  Level of Assistance Modified independent, requires aide device or extra time  Assistive Device Front wheel walker  Distance Ambulated (ft) 500 ft  Activity Response Tolerated well  Mobility Referral Yes  $Mobility charge 1 Mobility   Pt received in chair and agreed to mobility, had no pain nor discomfort during session, surprised herself by distance and lack of fatigue. Pt returned to bed with all needs met.   Roderick Pee Mobility Specialist

## 2022-07-11 ENCOUNTER — Encounter (HOSPITAL_COMMUNITY): Payer: Self-pay | Admitting: Surgery

## 2022-07-11 MED ORDER — METHOCARBAMOL 1000 MG PO TABS: 500.0000 mg | ORAL_TABLET | Freq: Four times a day (QID) | ORAL | 1 refills | Status: DC | PRN

## 2022-07-11 MED ORDER — GABAPENTIN 100 MG PO CAPS
300.0000 mg | ORAL_CAPSULE | Freq: Three times a day (TID) | ORAL | Status: DC
  Administered 2022-07-11 – 2022-07-12 (×4): 300 mg via ORAL
  Filled 2022-07-11 (×5): qty 3

## 2022-07-11 NOTE — Progress Notes (Addendum)
Michele [name] [id number] Aug 23, 1965  CARE TEAM:  PCP: Orma Render, NP  Outpatient Care Team: Patient Care Team: Early, Coralee Pesa, NP as PCP - General (Nurse Practitioner) Michael Boston, MD as Consulting Physician (Colon and Rectal Surgery) Georganna Skeans, MD as Consulting Physician (General Surgery) Sanjuana Kava, MD (Inactive) as Referring Physician (Obstetrics and Gynecology) Mansouraty, Telford Nab., MD as Consulting Physician (Gastroenterology)  Inpatient Treatment Team: Treatment Team: Attending Provider: Michael Boston, MD; Mobility Specialist: Karma Greaser; Charge Nurse: Arminda Resides, RN; Charge Nurse: Claris Pong, RN; Registered Nurse: Alva Garnet, RN   Problem List:   Principal Problem:   Delayed perforation of colon s/p [facility] colectomy/ostomy March 20213 Active Problems:   Hyperlipidemia   Type 2 diabetes mellitus without complication, without long-term current use of insulin ([place])   Vitamin D deficiency   Panic disorder   Acute deep vein thrombosis (DVT) of femoral vein (HCC)   Attention deficit hyperactivity disorder (ADHD), predominantly inattentive type   Incisional hernia, without obstruction or gangrene   Status post Hartmann's procedure ([place])   2 Days Post-Op  07/09/2022  POST-OPERATIVE DIAGNOSIS:   COLOSTOMY FOR COLON RESECTION. DESIRE FOR OSTOMY TAKEDOWN INCISIONAL HERNIA WITH DIASTASIS RECTI   PROCEDURE:  ROBOTIC COLOSTOMY TAKEDOWN ROBOTIC AND LAPAROSCOPIC LYSIS OF ADHESIONS X 2.5 HOURS  TRANSVERSUS ABDOMINIS PLANE (TAP) BLOCK - BILATERAL RIGID PROCTOSCOPY   SURGEON:  Adin Hector, MD  OR FINDINGS:    Patient had extensive intra-abdominal adhesions of omentum colon and small bowel to anterior abdominal wall and interloop adhesions.  Patient had a very large midline incisional hernia 18 x 8 cm in the setting of diastases & obesity.  Retraction of rectus muscle laterally with loss of domain right greater than left.  Chronic  midline granulation wound 75m deep in subcutaneous tissue not contiguous with peritoneum.  No foreign body suture near the region.     Patient has a isoperistaltic side proximal sigmoid to rectosigmoid colon stump anastomosis.   About a 25 cm from anal verge.    FINAL MICROSCOPIC DIAGNOSIS:   A. END COLOSTOMY:  - Squamocolumnar anastomosis, compatible with ostomy site.  - Negative for malignancy   Assessment  Recovering gradually.  [facility]  Plan:  Enhanced recovery protocol.  Continue abdominal binder with ice and heat and scheduled Tylenol & gabapentin.  Increase gabapentin.  Methocarbamol/tramadol/hydromorphone for backup   Advanced to solid diet.  Creatinine only mildly elevated but electrolytes stable.  Follow.  Follow if IV fluids appear in backup.  Continue dressing for now.  Most likely remove wicks and partially closed ostomy wound by postop day 3 = Saturday, sooner if leaving.  Periumbilical midline chronic abdominal wound seems rather superficial and short.  Cover and protect for now.  Try to avoid packing anymore.  Hopefully will seal up.  She does have quite a large incisional hernia.  To repair this would require component separation with probable bilateral TAR release with large retrorectus mesh reconstruction and excision of chronic midline hernia sac/scar.  I would want to wait at least 3 months from discharge from colectomy for even considering that.  May discuss with my colleagues that also do abdominal reconstructions.  Otherwise hold off if she is not severely symptomatic.  VTE prophylaxis- SCDs, etc  mobilize as tolerated to help recovery  Disposition:  Disposition:  The patient is from: Home  Anticipate discharge to:  Home  Anticipated Date of Discharge is:  December 3,2023  Barriers to discharge:  Pending Clinical improvement (more likely than not)  Patient currently is NOT MEDICALLY STABLE for discharge from the hospital from  a surgery standpoint.      I reviewed nursing notes, last 24 h vitals and pain scores, last 48 h intake and output, last 24 h labs and trends, and last 24 h imaging results. I have reviewed this patient's available data, including medical history, events of note, test results, etc as part of my evaluation.  A significant portion of that time was spent in counseling.  Care during the described time interval was provided by me.  This care required moderate level of medical decision making.  07/11/2022    Subjective: (Chief complaint)  Patient with some soreness but able to mobilize in hallways last night.  Trying tramadol  Needed Dilaudid x 1.  Tolerated Dys1 without nausea.   Objective:  Vital signs:  Vitals:   07/10/22 1023 07/10/22 1335 07/10/22 2232 07/11/22 0418  BP: 98/64 (!) 103/57 111/73 102/69  Pulse: 84 88 83 82  Resp:   16 16  Temp: 98.1 F (36.7 C) 97.7 F (36.5 C) 98.4 F (36.9 C) 98.4 F (36.9 C)  TempSrc: Oral Oral Oral Oral  SpO2: 93% 97% 92% 97%  Weight:      Height:        Last BM Date : 07/09/22  Intake/Output   Yesterday:  11/30 0701 - 12/01 0700 In: 1200 [P.O.:1200] Out: 1000 [Urine:1000] This shift:  No intake/output data recorded.  Bowel function:  Flatus: No  BM:  No  Drain: (No drain)   Physical Exam:  General: Pt awake/alert in no acute distress Eyes: PERRL, normal EOM.  Sclera clear.  No icterus Neuro: CN II-XII intact w/o focal sensory/motor deficits. Lymph: No head/neck/groin lymphadenopathy Psych:  No delerium/psychosis/paranoia.  Oriented x 4 HENT: Normocephalic, Mucus membranes moist.  No thrush Neck: Supple, No tracheal deviation.  No obvious thyromegaly Chest: No pain to chest wall compression.  Good respiratory excursion.  No audible wheezing CV:  Pulses intact.  Regular rhythm.  No major extremity edema MS: Normal AROM mjr joints.  No obvious deformity  Abdomen: Soft.  Nondistended.  Mildly tender at incisions  only.  No evidence of peritonitis.  No incarcerated hernias.  Dressings c/d/I - old blood at LLQ colostomy site  Ext:  No deformity.  No mjr edema.  No cyanosis Skin: No petechiae / purpurea.  No major sores.  Warm and dry    Results:   11/05/2021  SURGICAL PATHOLOGY  CASE: [id number] PATIENT: Michele [name] Surgical Pathology Report     Clinical History: perforated bowel (cm)     FINAL MICROSCOPIC DIAGNOSIS:  A. COLON, SIGMOID, RESECTION: Focal mucosal and transmural necrosis and associated marked acute and chronic fibropurulent serositis consistent with perforation Acute and chronic fibrinous serositis present in margins Proximal and distal margins viable and free of mucosal inflammation   Ebony Yorio DESCRIPTION:  The specimen is received fresh, and consists of a portion of colon with 2 stapled resection margins, measuring 5.8 cm in length.  A suture is present designating the distal end.  The serosal surface is pink-red, hyperemic, with diffuse tan-white exudate.  The surrounding adipose tissue is focally indurated.  A full-thickness perforation is identified, measuring 1.5 cm from the distal margin.  Lumen is filled with a minimal amount of tan-brown fecal material.  Mucosa is tan-pink with normal folding, and no additional diverticuli or perforations are identified.  The wall ranges from 0.2  to 0.3 cm in thickness.  No enlarged lymph nodes are grossly identified.  Representative sections are submitted in 4 cassettes. 1 = proximal margin 2 = distal margin 3 and 4 = area of perforation Craig Staggers 11/06/2021)    Final Diagnosis performed by Tobin Chad, MD.   Electronically signed 11/07/2021 Technical and / or Professional components performed at [facility]. [facility], [place] [address] [place] [address] [place], [place] [address].  Immunohistochemistry Technical component (if applicable) was performed at [facility]. [address] [place], [place], [place] [address].   IMMUNOHISTOCHEMISTRY DISCLAIMER (if applicable): Some of these immunohistochemical stains may have been developed and the performance characteristics determine by [facility]. Some may not have been cleared or approved by the U.S. Food and Drug Administration. The FDA has determined that such clearance or approval is not necessary. This test is used for clinical purposes. It should not be regarded as investigational or for research. This laboratory is certified under the [place] (CLIA-88) as qualified to perform high complexity clinical laboratory testing.  The controls stained appropriately.   07/09/2022 SURGICAL PATHOLOGY  CASE: [id number] PATIENT: Michele [name] Surgical Pathology Report     Clinical History: Colostomy for colon resection, desire for ostomy takedown (crm)     FINAL MICROSCOPIC DIAGNOSIS:  A. END COLOSTOMY: - Squamocolumnar anastomosis, compatible with ostomy site. - Negative for malignancy   Klye Besecker DESCRIPTION:  Received fresh is a 5.4 cm in length by 2.7 cm in diameter portion of bowel, with 1 end surrounded by a thin rim of tan wrinkled skin.  The specimen is consistent with that of the clinically stated ostomy.  The external surface is pink-tan and minimally roughened but otherwise intact.  The mucosa is tan and glistening without distinct lesions.  The wall has a thickness of 0.5 cm.  A representative section including a portion of the skin is submitted in 1 block. Amparo Bristol, 07/09/2022)    Final Diagnosis performed by Tilford Pillar, MD.   Electronically signed 07/10/2022 Technical and / or Professional components performed at [facility], [place] [address] [place], [place] [address].  Immunohistochemistry Technical component (if applicable) was performed at [facility]. [address] [place], [place], [place]  [address].   IMMUNOHISTOCHEMISTRY DISCLAIMER (if applicable): Some of these immunohistochemical stains may have been developed and the performance characteristics determine by [facility]. Some may not have been cleared or approved by the U.S. Food and Drug Administration. The FDA has determined that such clearance or approval is not necessary. This test is used for clinical purposes. It should not be regarded as investigational or for research. This laboratory is certified under the [place] (CLIA-88) as qualified to perform high complexity clinical laboratory testing.  The controls stained appropriately.     Cultures: No results found for this or any previous visit (from the past 720 hour(s)).  Labs: Results for orders placed or performed during the hospital encounter of 07/09/22 (from the past 48 hour(s))  Surgical pathology     Status: None   Collection Time: 07/09/22 12:14 PM  Result Value Ref Range   SURGICAL PATHOLOGY      SURGICAL PATHOLOGY CASE: [id number] PATIENT: Michele [name] Surgical Pathology Report     Clinical History: Colostomy for colon resection, desire for ostomy takedown ([place]rm)     FINAL MICROSCOPIC DIAGNOSIS:  A. END COLOSTOMY: - Squamocolumnar anastomosis, compatible with ostomy site. - Negative for malignancy  Felipe Paluch DESCRIPTION:  Received fresh is a 5.4 cm in length by 2.7 cm in diameter portion of bowel, with 1 end surrounded by a thin rim of tan wrinkled skin.  The specimen is consistent with that of the clinically stated ostomy.  The external surface is pink-tan and minimally roughened but otherwise intact.  The mucosa is tan and glistening without distinct lesions.  The wall has a thickness of 0.5 cm.  A representative section including a portion of the skin is submitted in 1 block. Amparo Bristol, 07/09/2022)    Final Diagnosis performed by Tilford Pillar, MD.   Electronically  signed 07/10/2022 Technical and / or Professional components performed at [facility], [place] [address] [place], [place] [address].  Immunohistochemistry Technical component (if applicable) was performed at [facility]. [address], [place], [place], [place] [address].   IMMUNOHISTOCHEMISTRY DISCLAIMER (if applicable): Some of these immunohistochemical stains may have been developed and the performance characteristics determine by [facility] Some may not have been cleared or approved by the U.S. Food and Drug Administration. The FDA has determined that such clearance or approval is not necessary. This test is used for clinical purposes. It should not be regarded as investigational or for research. This laboratory is certified under the [place] (CLIA-88) as qualified to perform high complexity clinical laboratory testing.  The controls stained appropriately.   Basic metabolic panel     Status: Abnormal   Collection Time: 07/10/22  4:20 AM  Result Value Ref Range   Sodium 136 135 - 145 mmol/L   Potassium 4.1 3.5 - 5.1 mmol/L   Chloride 104 98 - 111 mmol/L   CO2 23 22 - 32 mmol/L   Glucose, Bld 123 (H) 70 - 99 mg/dL    Comment: Glucose reference range applies only to samples taken after fasting for at least 8 hours.   BUN 14 6 - 20 mg/dL   Creatinine, Ser 1.15 (H) 0.44 - 1.00 mg/dL   Calcium 8.2 (L) 8.9 - 10.3 mg/dL   GFR, Estimated 56 (L) >60 mL/min    Comment: (NOTE) Calculated using the CKD-EPI Creatinine Equation (2021)    Anion gap 9 5 - 15    Comment: Performed at [facility], [place] [address], [place], [place] [address]  CBC     Status: Abnormal   Collection Time: 07/10/22  4:20 AM  Result Value Ref Range   WBC 9.0 4.0 - 10.5 K/uL   RBC 4.39 3.87 - 5.11 MIL/uL   Hemoglobin 11.1 (L) 12.0 - 15.0 g/dL   HCT 36.1 36.0 - 46.0 %   MCV 82.2 80.0 - 100.0 fL    MCH 25.3 (L) 26.0 - 34.0 pg   MCHC 30.7 30.0 - 36.0 g/dL   RDW 17.1 (H) 11.5 - 15.5 %   Platelets 320 150 - 400 K/uL   nRBC 0.0 0.0 - 0.2 %    Comment: Performed at [facility], [place] [address], [place], [place] [address]  Magnesium     Status: None   Collection Time: 07/10/22  4:20 AM  Result Value Ref Range   Magnesium 2.0 1.7 - 2.4 mg/dL    Comment: Performed at [facility], [place] [address], [place], [place] [address]    Imaging / Studies: No results found.  Medications / Allergies: per chart  Antibiotics: Anti-infectives (From admission, onward)    Start     Dose/Rate Route  Frequency Ordered Stop   07/09/22 2100  cefoTEtan (CEFOTAN) 2 g in sodium chloride 0.9 % 100 mL IVPB        2 g 200 mL/hr over 30 Minutes Intravenous Every 12 hours 07/09/22 1407 07/10/22 0450   07/09/22 1400  neomycin (MYCIFRADIN) tablet 1,000 mg  Status:  Discontinued       See Hyperspace for full Linked Orders Report.   1,000 mg Oral 3 times per day 07/09/22 0635 07/09/22 0641   07/09/22 1400  metroNIDAZOLE (FLAGYL) tablet 1,000 mg  Status:  Discontinued       See Hyperspace for full Linked Orders Report.   1,000 mg Oral 3 times per day 07/09/22 0635 07/09/22 0641   07/09/22 0645  cefoTEtan (CEFOTAN) 2 g in sodium chloride 0.9 % 100 mL IVPB        2 g 200 mL/hr over 30 Minutes Intravenous On call to O.R. 07/09/22 0635 07/09/22 1900         Note: Portions of this report may have been transcribed using voice recognition software. Every effort was made to ensure accuracy; however, inadvertent computerized transcription errors may be present.   Any transcriptional errors that result from this process are unintentional.    Adin Hector, MD, FACS, MASCRS Esophageal, Gastrointestinal & Colorectal Surgery Robotic and Minimally Invasive Surgery  [facility]. [address], [place], [place]  [address] [phone] Fax [phone] Main  CONTACT INFORMATION:  Weekday (9AM-5PM): Call [facility] main office at [phone]  Weeknight (5PM-9AM) or Weekend/Holiday: Check [email or web address] (password " [email or web address]") for General Surgery [facility] coverage  (Please, do not use SecureChat as it is not reliable communication to reach operating surgeons for immediate patient care given surgeries/outpatient duties/clinic/cross-coverage/off post-call which would lead to a delay in care.  Epic staff messaging available for outptient concerns, but may not be answered for 48 hours or more).     07/11/2022  7:20 AM

## 2022-07-12 MED ORDER — CITALOPRAM HYDROBROMIDE 20 MG PO TABS
20.0000 mg | ORAL_TABLET | Freq: Every day | ORAL | Status: DC
  Administered 2022-07-12 – 2022-07-13 (×2): 20 mg via ORAL
  Filled 2022-07-12 (×2): qty 1

## 2022-07-12 NOTE — Progress Notes (Signed)
3 Days Post-Op   Subjective/Chief Complaint: Feels ok. Just started passing flatus   Objective: Vital signs in last 24 hours: Temp:  [98.1 F (36.7 C)-98.7 F (37.1 C)] 98.1 F (36.7 C) (12/02 0502) Pulse Rate:  [80-88] 80 (12/02 0502) Resp:  [16] 16 (12/02 0502) BP: (108-117)/(71-80) 117/76 (12/02 0502) SpO2:  [95 %-99 %] 96 % (12/02 0502) Weight:  [73.7 kg] 73.7 kg (12/02 0500) Last BM Date : 07/09/22  Intake/Output from previous day: 12/01 0701 - 12/02 0700 In: 120 [P.O.:120] Out: 900 [Urine:900] Intake/Output this shift: No intake/output data recorded.  General appearance: alert and cooperative Resp: clear to auscultation bilaterally Cardio: regular rate and rhythm GI: soft, mild tenderness. Wicks removed  Lab Results:  Recent Labs    07/10/22 0420  WBC 9.0  HGB 11.1*  HCT 36.1  PLT 320   BMET Recent Labs    07/10/22 0420  NA 136  K 4.1  CL 104  CO2 23  GLUCOSE 123*  BUN 14  CREATININE 1.15*  CALCIUM 8.2*   PT/INR No results for input(s): "LABPROT", "INR" in the last 72 hours. ABG No results for input(s): "PHART", "HCO3" in the last 72 hours.  Invalid input(s): "PCO2", "PO2"  Studies/Results: No results found.  Anti-infectives: Anti-infectives (From admission, onward)    Start     Dose/Rate Route Frequency Ordered Stop   07/09/22 2100  cefoTEtan (CEFOTAN) 2 g in sodium chloride 0.9 % 100 mL IVPB        2 g 200 mL/hr over 30 Minutes Intravenous Every 12 hours 07/09/22 1407 07/10/22 0450   07/09/22 1400  neomycin (MYCIFRADIN) tablet 1,000 mg  Status:  Discontinued       See Hyperspace for full Linked Orders Report.   1,000 mg Oral 3 times per day 07/09/22 0635 07/09/22 0641   07/09/22 1400  metroNIDAZOLE (FLAGYL) tablet 1,000 mg  Status:  Discontinued       See Hyperspace for full Linked Orders Report.   1,000 mg Oral 3 times per day 07/09/22 0635 07/09/22 0641   07/09/22 0645  cefoTEtan (CEFOTAN) 2 g in sodium chloride 0.9 % 100 mL IVPB         2 g 200 mL/hr over 30 Minutes Intravenous On call to O.R. 07/09/22 0635 07/09/22 1900       Assessment/Plan: s/p Procedure(s) with comments: ROBOTIC OSTOMY TAKEDOWN (N/A) - GEN w/ ERAS PATHWAY LOCAL ROBOTIC AND LAP LYSIS OF ADHESIONS, BILATERAL TAP BLOCK (N/A) RIGID PROCTOSCOPY (N/A) CYSTOSCOPY with FIREFLY INJECTION (N/A) Advance diet Removed wicks Ambulate Plan for d/c tomorrow Problem List:    Principal Problem:   Delayed perforation of colon s/p [facility] colectomy/ostomy March 20213 Active Problems:   Hyperlipidemia   Type 2 diabetes mellitus without complication, without long-term current use of insulin (HCC)   Vitamin D deficiency   Panic disorder   Acute deep vein thrombosis (DVT) of femoral vein (HCC)   Attention deficit hyperactivity disorder (ADHD), predominantly inattentive type   Incisional hernia, without obstruction or gangrene   Status post Hartmann's procedure ([place])    LOS: 3 days    Autumn Messing III 07/12/2022

## 2022-07-13 MED ORDER — TRAMADOL HCL 50 MG PO TABS: 50.0000 mg | ORAL_TABLET | Freq: Four times a day (QID) | ORAL | 1 refills | Status: DC | PRN

## 2022-07-13 NOTE — Progress Notes (Signed)
4 Days Post-Op   Subjective/Chief Complaint: Complains of soreness but feels ready to go home   Objective: Vital signs in last 24 hours: Temp:  [98.2 F (36.8 C)-98.8 F (37.1 C)] 98.8 F (37.1 C) (12/02 2157) Pulse Rate:  [76-78] 78 (12/02 2157) Resp:  [16] 16 (12/02 2157) BP: (126-129)/(76-80) 126/76 (12/02 2157) SpO2:  [98 %-99 %] 99 % (12/02 2157) Last BM Date : 07/11/22  Intake/Output from previous day: 12/02 0701 - 12/03 0700 In: 120 [P.O.:120] Out: -  Intake/Output this shift: Total I/O In: 120 [P.O.:120] Out: -   General appearance: alert and cooperative Resp: clear to auscultation bilaterally Cardio: regular rate and rhythm GI: soft, mild tenderness. Incisions ok  Lab Results:  No results for input(s): "WBC", "HGB", "HCT", "PLT" in the last 72 hours. BMET No results for input(s): "NA", "K", "CL", "CO2", "GLUCOSE", "BUN", "CREATININE", "CALCIUM" in the last 72 hours. PT/INR No results for input(s): "LABPROT", "INR" in the last 72 hours. ABG No results for input(s): "PHART", "HCO3" in the last 72 hours.  Invalid input(s): "PCO2", "PO2"  Studies/Results: No results found.  Anti-infectives: Anti-infectives (From admission, onward)    Start     Dose/Rate Route Frequency Ordered Stop   07/09/22 2100  cefoTEtan (CEFOTAN) 2 g in sodium chloride 0.9 % 100 mL IVPB        2 g 200 mL/hr over 30 Minutes Intravenous Every 12 hours 07/09/22 1407 07/10/22 0450   07/09/22 1400  neomycin (MYCIFRADIN) tablet 1,000 mg  Status:  Discontinued       See Hyperspace for full Linked Orders Report.   1,000 mg Oral 3 times per day 07/09/22 0635 07/09/22 0641   07/09/22 1400  metroNIDAZOLE (FLAGYL) tablet 1,000 mg  Status:  Discontinued       See Hyperspace for full Linked Orders Report.   1,000 mg Oral 3 times per day 07/09/22 0635 07/09/22 0641   07/09/22 0645  cefoTEtan (CEFOTAN) 2 g in sodium chloride 0.9 % 100 mL IVPB        2 g 200 mL/hr over 30 Minutes Intravenous On  call to O.R. 07/09/22 0635 07/09/22 1900       Assessment/Plan: s/p Procedure(s) with comments: ROBOTIC OSTOMY TAKEDOWN (N/A) - GEN w/ ERAS PATHWAY LOCAL ROBOTIC AND LAP LYSIS OF ADHESIONS, BILATERAL TAP BLOCK (N/A) RIGID PROCTOSCOPY (N/A) CYSTOSCOPY with FIREFLY INJECTION (N/A) Advance diet Discharge  LOS: 4 days    Autumn Messing III 07/13/2022

## 2022-07-13 NOTE — Progress Notes (Signed)
Nurse reviewed discharge instructions with pt.  Pt verbalized understanding of discharge instructions, follow up appointment and new medications.  No concerns at time of discharge.

## 2022-07-13 NOTE — Progress Notes (Signed)
PHARMACIST - PHYSICIAN COMMUNICATION  CONCERNING: Alvimopan   RECOMMENDATION: This patient is receiving alvimopan post-operatively.  Based on criteria approved by the Pharmacy and Therapeutics Committee, the medication will be discontinued.  DESCRIPTION: These criteria include: Patient will receive NO MORE than 15 doses total during current hospitalization If bowel recovery (documented return of bowel sounds and a bowel movement confirmed by RN or patient) occurs before completion of 7 days of therapy, a pharmacist may discontinue alvimopan  If you have questions about this conversion, please contact the [place], PharmD, BCPS Clinical Pharmacist 07/13/2022 9:48 AM

## 2022-07-13 NOTE — Progress Notes (Signed)
Dr. Marlou Starks came up to unit to round on pt. Informed him of pt refusing Gabapentin last night. He stated okay. No orders received.

## 2022-07-14 ENCOUNTER — Other Ambulatory Visit (HOSPITAL_BASED_OUTPATIENT_CLINIC_OR_DEPARTMENT_OTHER): Payer: Self-pay

## 2022-07-14 NOTE — Discharge Summary (Signed)
Physician Discharge Summary    Patient ID: Michele [name] MRN: [id number] DOB/AGE: [birth year]  56 y.o.  Patient Care Team: Early, Coralee Pesa, NP as PCP - General (Nurse Practitioner) Michael Boston, MD as Consulting Physician (Colon and Rectal Surgery) Georganna Skeans, MD as Consulting Physician (General Surgery) Sanjuana Kava, MD (Inactive) as Referring Physician (Obstetrics and Gynecology) Mansouraty, Telford Nab., MD as Consulting Physician (Gastroenterology)  Admit date: 07/09/2022  Discharge date: 07/13/2022 Hospital Stay = 4 days    Discharge Diagnoses:  Principal Problem:   Delayed perforation of colon s/p [facility] colectomy/ostomy March 20213 Active Problems:   Hyperlipidemia   Type 2 diabetes mellitus without complication, without long-term current use of insulin (HCC)   Vitamin D deficiency   Panic disorder   Acute deep vein thrombosis (DVT) of femoral vein (HCC)   Attention deficit hyperactivity disorder (ADHD), predominantly inattentive type   Incisional hernia, without obstruction or gangrene   Status post Hartmann's procedure ([place])     07/09/2022   POST-OPERATIVE DIAGNOSIS:   COLOSTOMY FOR COLON RESECTION. DESIRE FOR OSTOMY TAKEDOWN INCISIONAL HERNIA WITH DIASTASIS RECTI   PROCEDURE:  ROBOTIC COLOSTOMY TAKEDOWN ROBOTIC AND LAPAROSCOPIC LYSIS OF ADHESIONS X 2.5 HOURS  TRANSVERSUS ABDOMINIS PLANE (TAP) BLOCK - BILATERAL RIGID PROCTOSCOPY   SURGEON:  Adin Hector, MD   OR FINDINGS:    Patient had extensive intra-abdominal adhesions of omentum colon and small bowel to anterior abdominal wall and interloop adhesions.  Patient had a very large midline incisional hernia 18 x 8 cm in the setting of diastases & obesity.  Retraction of rectus muscle laterally with loss of domain right greater than left.  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  No foreign body suture near the region.     Patient has a  isoperistaltic side proximal sigmoid to rectosigmoid colon stump anastomosis.   About a 25 cm from anal verge.     FINAL MICROSCOPIC DIAGNOSIS:    A. END COLOSTOMY:  - Squamocolumnar anastomosis, compatible with ostomy site.  - Negative for malignancy   Consults: Case Management / Social Work, Pharmacy, Anesthesia, and Urology  Hospital Course:   The patient underwent the surgery above.  Postoperatively, the patient gradually mobilized and advanced to a solid diet.  Pain and other symptoms were treated aggressively.    By the time of discharge, the patient was walking well the hallways, eating food, having flatus.  Pain was well-controlled on an oral medications.  Based on meeting discharge criteria and continuing to recover, I felt it was safe for the patient to be discharged from the hospital to further recover with close followup. Postoperative recommendations were discussed in detail.  They are written as well.  Discharged Condition: good  Discharge Exam: Blood pressure 125/81, pulse 65, temperature 97.7 F (36.5 C), temperature source Oral, resp. rate 16, height 5' (1.524 m), weight 73.7 kg, last menstrual period 10/07/2021, SpO2 99 %.  General: Pt awake/alert/oriented x4 in No acute distress Eyes: PERRL, normal EOM.  Sclera clear.  No icterus Neuro: CN II-XII intact w/o focal sensory/motor deficits. Lymph: No head/neck/groin lymphadenopathy Psych:  No delerium/psychosis/paranoia HENT: Normocephalic, Mucus membranes moist.  No thrush Neck: Supple, No tracheal deviation Chest:  No chest wall pain w good excursion CV:  Pulses intact.  Regular rhythm MS: Normal AROM mjr joints.  No obvious deformity Abdomen: Soft.  Nondistended.  Mildly tender at incisions only.  LArge incisional hernia stable & unchanged.  No evidence of peritonitis.  No incarcerated hernias. Ext:  SCDs BLE.  No mjr edema.  No cyanosis Skin: No petechiae / purpura   Disposition:    Follow-up Information      Michael Boston, MD Follow up on 07/28/2022.   Specialties: General Surgery, Colon and Rectal Surgery Why: To follow up after your operation Contact information: [place] [address] [phone]                 Discharge disposition: 01-Home or Self Care       Discharge Instructions     Call MD for:   Complete by: As directed    FEVER > 101.5 F  (temperatures < 101.5 F are not significant)   Call MD for:  difficulty breathing, headache or visual disturbances   Complete by: As directed    Call MD for:  extreme fatigue   Complete by: As directed    Call MD for:  extreme fatigue   Complete by: As directed    Call MD for:  hives   Complete by: As directed    Call MD for:  persistant dizziness or light-headedness   Complete by: As directed    Call MD for:  persistant dizziness or light-headedness   Complete by: As directed    Call MD for:  persistant nausea and vomiting   Complete by: As directed    Call MD for:  persistant nausea and vomiting   Complete by: As directed    Call MD for:  redness, tenderness, or signs of infection (pain, swelling, redness, odor or green/yellow discharge around incision site)   Complete by: As directed    Call MD for:  redness, tenderness, or signs of infection (pain, swelling, redness, odor or green/yellow discharge around incision site)   Complete by: As directed    Call MD for:  severe uncontrolled pain   Complete by: As directed    Call MD for:  severe uncontrolled pain   Complete by: As directed    Call MD for:  temperature >100.4   Complete by: As directed    Diet - low sodium heart healthy   Complete by: As directed    Start with a bland diet such as soups, liquids, starchy foods, low fat foods, etc. the first few days at home. Gradually advance to a solid, low-fat, high fiber diet by the end of the first week at home.   Add a fiber supplement to your diet (Metamucil, etc) If you feel full,  bloated, or constipated, stay on a full liquid or pureed/blenderized diet for a few days until you feel better and are no longer constipated.   Diet - low sodium heart healthy   Complete by: As directed    Discharge instructions   Complete by: As directed    See Discharge Instructions If you are not getting better after two weeks or are noticing you are getting worse, contact our office [phone] for further advice.  We may need to adjust your medications, re-evaluate you in the office, send you to the emergency room, or see what other things we can do to help. The clinic staff is available to answer your questions during regular business hours (8:30am-5pm).  Please don't hesitate to call and ask to speak to one of our nurses for clinical concerns.    A surgeon from [facility] is always on call at the hospitals 24 hours/day If you have a medical emergency, go to the  nearest emergency room or call 911.   Discharge instructions   Complete by: As directed    May shower. Cover lower incision with clean gauze after shower. Diet as tolerated. No heavy lifting   Discharge wound care:   Complete by: As directed    It is good for closed incisions and even open wounds to be washed every day.  Shower every day.  Short baths are fine.  Wash the incisions and wounds clean with soap & water.    You may leave closed incisions open to air if it is dry.   You may cover the incision with clean gauze & replace it after your daily shower for comfort.  You should have shoelace wicks removed from your old ostomy wound by discharge.  Remove all other bandaid clear tegaderm dressings by Saturday 12/2   Driving Restrictions   Complete by: As directed    You may drive when: - you are no longer taking narcotic prescription pain medication - you can comfortably wear a seatbelt - you can safely make sudden turns/stops without pain.   Increase activity slowly   Complete by: As directed    Start light  daily activities --- self-care, walking, climbing stairs- beginning the day after surgery.  Gradually increase activities as tolerated.  Control your pain to be active.  Stop when you are tired.  Ideally, walk several times a day, eventually an hour a day.   Most people are back to most day-to-day activities in a few weeks.  It takes 4-6 weeks to get back to unrestricted, intense activity. If you can walk 30 minutes without difficulty, it is safe to try more intense activity such as jogging, treadmill, bicycling, low-impact aerobics, swimming, etc. Save the most intensive and strenuous activity for last (Usually 4-8 weeks after surgery) such as sit-ups, heavy lifting, contact sports, etc.  Refrain from any intense heavy lifting or straining until you are off narcotics for pain control.  You will have off days, but things should improve week-by-week. DO NOT PUSH THROUGH PAIN.  Let pain be your guide: If it hurts to do something, don't do it.   Increase activity slowly   Complete by: As directed    Lifting restrictions   Complete by: As directed    If you can walk 30 minutes without difficulty, it is safe to try more intense activity such as jogging, treadmill, bicycling, low-impact aerobics, swimming, etc. Save the most intensive and strenuous activity for last (Usually 4-8 weeks after surgery) such as sit-ups, heavy lifting, contact sports, etc.   Refrain from any intense heavy lifting or straining until you are off narcotics for pain control.  You will have off days, but things should improve week-by-week. DO NOT PUSH THROUGH PAIN.  Let pain be your guide: If it hurts to do something, don't do it.  Pain is your body warning you to avoid that activity for another week until the pain goes down.   May shower / Bathe   Complete by: As directed    May walk up steps   Complete by: As directed    No wound care   Complete by: As directed    Remove dressing in 72 hours   Complete by: As directed    Make  sure all dressings are removed by the third day after surgery = Saturday 12/2.  Leave incisions open to air.  OK to cover incisions with gauze or bandages as desired   Sexual Activity Restrictions  Complete by: As directed    You may have sexual intercourse when it is comfortable. If it hurts to do something, stop.       Allergies as of 07/13/2022       Reactions   Atorvastatin Rash, Other (See Comments)   Muscle aches on Lipitor 20 mg and 40 mg    Sulfa Antibiotics Nausea And Vomiting, Other (See Comments), Rash        Medication List     TAKE these medications    amphetamine-dextroamphetamine 30 MG 24 hr capsule Commonly known as: ADDERALL XR Take 1 capsule (30 mg total) by mouth daily.   ascorbic acid 500 MG tablet Commonly known as: VITAMIN C Take 1 tablet (500 mg total) by mouth 2 (two) times daily. What changed: when to take this   citalopram 20 MG tablet Commonly known as: CELEXA Take 1 tablet (20 mg total) by mouth daily.   clonazePAM 1 MG tablet Commonly known as: KLONOPIN Take 0.5-1 tablets (0.5-1 mg total) by mouth 2 (two) times daily as needed (severe panic symptoms). What changed:  how much to take when to take this   ferrous sulfate 325 (65 FE) MG tablet Take 1 tablet (325 mg total) by mouth daily with breakfast.   FLINTSTONES PLUS EXTRA IRON PO Take 1 tablet by mouth daily.   fluticasone 50 MCG/ACT nasal spray Commonly known as: FLONASE Place 1 spray into both nostrils daily as needed for allergies or rhinitis.   melatonin 5 MG Tabs Take 5 mg by mouth at bedtime as needed (sleep).   Methocarbamol 1000 MG Tabs Take 500 mg by mouth every 6 (six) hours as needed for muscle spasms.   mineral oil-hydrophilic petrolatum ointment Apply 1 Application topically as needed for irritation.   mometasone 0.1 % cream Commonly known as: ELOCON Apply 1 Application topically as needed for itching.   polyethylene glycol 17 g packet Commonly known as:  MIRALAX / GLYCOLAX Take 17 g by mouth 2 (two) times daily as needed. What changed: when to take this   rosuvastatin 5 MG tablet Commonly known as: Crestor Take 1 tablet (5 mg total) by mouth daily.   traMADol 50 MG tablet Commonly known as: ULTRAM Take 1-2 tablets (50-100 mg total) by mouth every 6 (six) hours as needed for moderate pain or severe pain.   traMADol 50 MG tablet Commonly known as: ULTRAM Take 1-2 tablets (50-100 mg total) by mouth every 6 (six) hours as needed for moderate pain.   VITAMIN D (CHOLECALCIFEROL) PO Take 1 capsule by mouth 3 (three) times a week.               Discharge Care Instructions  (From admission, onward)           Start     Ordered   07/09/22 0000  Discharge wound care:       Comments: It is good for closed incisions and even open wounds to be washed every day.  Shower every day.  Short baths are fine.  Wash the incisions and wounds clean with soap & water.    You may leave closed incisions open to air if it is dry.   You may cover the incision with clean gauze & replace it after your daily shower for comfort.  You should have shoelace wicks removed from your old ostomy wound by discharge.  Remove all other bandaid clear tegaderm dressings by Saturday 12/2   07/09/22 3235  Significant Diagnostic Studies:  No results found for this or any previous visit (from the past 72 hour(s)).  No results found.  Past Medical History:  Diagnosis Date   Allergy    Anemia    Anxiety    Delayed perforation of colon s/p [facility] colectomy/ostomy March 20213 11/05/2021   Depression    Hyperlipidemia    Pre-diabetes     Past Surgical History:  Procedure Laterality Date   CESAREAN SECTION     TWO of them   COLOSTOMY Left 11/05/2021   Procedure: COLOSTOMY;  Surgeon: Sanjuana Kava, MD;  Location: [place];  Service: Gynecology;  Laterality: Left;   CYSTOSCOPY  10/25/2021   Procedure: CYSTOSCOPY;  Surgeon: Sanjuana Kava, MD;   Location: [place];  Service: Gynecology;;   dental implants N/A 2016   lower 2 teeth   HYSTERECTOMY ABDOMINAL WITH SALPINGECTOMY  10/25/2021   Procedure: HYSTERECTOMY ABDOMINAL WITH BILATERAL SALPINGECTOMY;  Surgeon: Sanjuana Kava, MD;  Location: [place];  Service: Gynecology;;   HYSTEROSCOPY WITH D & C  01/30/2017   Procedure: DILATATION AND CURETTAGE /HYSTEROSCOPY;  Surgeon: Sanjuana Kava, MD;  Location: [facility];  Service: Gynecology;;   IR CATHETER TUBE CHANGE  12/02/2021   IR GUIDED DRAIN W CATHETER PLACEMENT  12/02/2021   IR RADIOLOGIST EVAL & MGMT  12/17/2021   IR SINUS/FIST TUBE CHK-NON GI  12/02/2021   LAPAROSCOPIC LYSIS OF ADHESIONS N/A 07/09/2022   Procedure: ROBOTIC AND LAP LYSIS OF ADHESIONS, BILATERAL TAP BLOCK;  Surgeon: Michael Boston, MD;  Location: [facility];  Service: General;  Laterality: N/A;   LAPAROSCOPY  10/25/2021   Procedure: LAPAROSCOPY DIAGNOSTIC;  Surgeon: Sanjuana Kava, MD;  Location: [place];  Service: Gynecology;;   LAPAROTOMY N/A 11/05/2021   Procedure: EXPLORATORY LAPAROTOMY AND COLECTOMY ;  Surgeon: Sanjuana Kava, MD;  Location: [place];  Service: Gynecology;  Laterality: N/A;   LAPAROTOMY N/A 11/14/2021   Procedure: EXPLORATORY  LAPAROTOMY AND ABDOMINAL WALL CLOSURE;  Surgeon: Erroll Luna, MD;  Location: [facility];  Service: General;  Laterality: N/A;   PROCTOSCOPY N/A 07/09/2022   Procedure: RIGID PROCTOSCOPY;  Surgeon: Michael Boston, MD;  Location: [facility];  Service: General;  Laterality: N/A;   XI ROBOTIC ASSISTED COLOSTOMY TAKEDOWN N/A 07/09/2022   Procedure: ROBOTIC OSTOMY TAKEDOWN;  Surgeon: Michael Boston, MD;  Location: [facility];  Service: General;  Laterality: N/A;  GEN w/ [facility] PATHWAY LOCAL    Social History   Socioeconomic History   Marital status: Divorced    Spouse name: Not on file   Number of children: Not on file   Years of education: Not on file   Highest education level: Not on file  Occupational History   Not on file  Tobacco Use   Smoking status: Former     Packs/day: 0.50    Years: 15.00    Total pack years: 7.50    Types: Cigarettes    Quit date: 2003    Years since quitting: 20.9   Smokeless tobacco: Never   Tobacco comments:    quit 2003  Vaping Use   Vaping Use: Never used  Substance and Sexual Activity   Alcohol use: No   Drug use: No   Sexual activity: Yes    Birth control/protection: Surgical    Comment: 2006  Other Topics Concern   Not on file  Social History Narrative   The pt is a homemaker -pt quit smoking cigarettes 2004    Social Determinants of Health   Financial Resource Strain:  Not on file  Food Insecurity: Not on file  Transportation Needs: Not on file  Physical Activity: Not on file  Stress: Not on file  Social Connections: Not on file  Intimate Partner Violence: Not on file    Family History  Problem Relation Age of Onset   Colon polyps Mother    Heart attack Father    Hypertension Sister    Colon cancer Neg Hx    Esophageal cancer Neg Hx    Rectal cancer Neg Hx    Stomach cancer Neg Hx     No current facility-administered medications for this encounter.   Current Outpatient Medications  Medication Sig Dispense Refill   amphetamine-dextroamphetamine (ADDERALL XR) 30 MG 24 hr capsule Take 1 capsule (30 mg total) by mouth daily. 30 capsule 0   ascorbic acid (VITAMIN C) 500 MG tablet Take 1 tablet (500 mg total) by mouth 2 (two) times daily. (Patient taking differently: Take 500 mg by mouth 3 (three) times a week.) 60 tablet 0   citalopram (CELEXA) 20 MG tablet Take 1 tablet (20 mg total) by mouth daily. 90 tablet 3   clonazePAM (KLONOPIN) 1 MG tablet Take 0.5-1 tablets (0.5-1 mg total) by mouth 2 (two) times daily as needed (severe panic symptoms). (Patient taking differently: Take 1 mg by mouth daily.) 60 tablet 2   fluticasone (FLONASE) 50 MCG/ACT nasal spray Place 1 spray into both nostrils daily as needed for allergies or rhinitis.     melatonin 5 MG TABS Take 5 mg by mouth at bedtime as needed  (sleep).     Pediatric Multivitamins-Iron (FLINTSTONES PLUS EXTRA IRON PO) Take 1 tablet by mouth daily.     polyethylene glycol (MIRALAX / GLYCOLAX) 17 g packet Take 17 g by mouth 2 (two) times daily as needed. (Patient taking differently: Take 17 g by mouth every evening.) 14 each 0   rosuvastatin (CRESTOR) 5 MG tablet Take 1 tablet (5 mg total) by mouth daily. 90 tablet 3   traMADol (ULTRAM) 50 MG tablet Take 1-2 tablets (50-100 mg total) by mouth every 6 (six) hours as needed for moderate pain or severe pain. 20 tablet 0   VITAMIN D, CHOLECALCIFEROL, PO Take 1 capsule by mouth 3 (three) times a week.     ferrous sulfate 325 (65 FE) MG tablet Take 1 tablet (325 mg total) by mouth daily with breakfast. (Patient not taking: Reported on 06/27/2022) 30 tablet 0   methocarbamol 1000 MG TABS Take 500 mg by mouth every 6 (six) hours as needed for muscle spasms. 20 tablet 1   mineral oil-hydrophilic petrolatum (AQUAPHOR) ointment Apply 1 Application topically as needed for irritation.     mometasone (ELOCON) 0.1 % cream Apply 1 Application topically as needed for itching. (Patient not taking: Reported on 06/27/2022) 45 g 2   traMADol (ULTRAM) 50 MG tablet Take 1-2 tablets (50-100 mg total) by mouth every 6 (six) hours as needed for moderate pain. 20 tablet 1     Allergies  Allergen Reactions   Atorvastatin Rash and Other (See Comments)    Muscle aches on Lipitor 20 mg and 40 mg     Sulfa Antibiotics Nausea And Vomiting, Other (See Comments) and Rash    Signed:   Adin Hector, MD, FACS, MASCRS Esophageal, Gastrointestinal & Colorectal Surgery Robotic and Minimally Invasive Surgery  [facility] [address] [place], [place] [address] [phone] Fax [phone] Main  CONTACT INFORMATION:  Weekday (9AM-5PM): Call [facility] main office at [phone]  Weeknight (5PM-9AM) or Weekend/Holiday: Check [email or web address] (password  " [email or web address]") for General Surgery [facility] coverage  (Please, do not use SecureChat as it is not reliable communication to reach operating surgeons for immediate patient care given surgeries/outpatient duties/clinic/cross-coverage/off post-call which would lead to a delay in care.  Epic staff messaging available for outptient concerns, but may not be answered for 48 hours or more).     07/14/2022, 1:07 PM

## 2022-07-18 DIAGNOSIS — F4323 Adjustment disorder with mixed anxiety and depressed mood: Secondary | ICD-10-CM | POA: Diagnosis not present

## 2022-07-29 DIAGNOSIS — F4323 Adjustment disorder with mixed anxiety and depressed mood: Secondary | ICD-10-CM | POA: Diagnosis not present

## 2022-08-02 ENCOUNTER — Encounter (HOSPITAL_BASED_OUTPATIENT_CLINIC_OR_DEPARTMENT_OTHER): Payer: Self-pay | Admitting: Emergency Medicine

## 2022-08-02 ENCOUNTER — Emergency Department (HOSPITAL_BASED_OUTPATIENT_CLINIC_OR_DEPARTMENT_OTHER)
Admission: EM | Admit: 2022-08-02 | Discharge: 2022-08-02 | Disposition: A | Discharge status: Home / Self Care | Payer: BC Managed Care – PPO | Attending: Emergency Medicine | Admitting: Emergency Medicine

## 2022-08-02 ENCOUNTER — Emergency Department (HOSPITAL_BASED_OUTPATIENT_CLINIC_OR_DEPARTMENT_OTHER): Payer: BC Managed Care – PPO

## 2022-08-02 DIAGNOSIS — Y9389 Activity, other specified: Secondary | ICD-10-CM | POA: Diagnosis not present

## 2022-08-02 DIAGNOSIS — S4990XA Unspecified injury of shoulder and upper arm, unspecified arm, initial encounter: Secondary | ICD-10-CM | POA: Diagnosis not present

## 2022-08-02 DIAGNOSIS — I808 Phlebitis and thrombophlebitis of other sites: Secondary | ICD-10-CM | POA: Insufficient documentation

## 2022-08-02 DIAGNOSIS — Y9289 Other specified places as the place of occurrence of the external cause: Secondary | ICD-10-CM | POA: Diagnosis not present

## 2022-08-02 DIAGNOSIS — X58XXXA Exposure to other specified factors, initial encounter: Secondary | ICD-10-CM | POA: Insufficient documentation

## 2022-08-02 DIAGNOSIS — Z7952 Long term (current) use of systemic steroids: Secondary | ICD-10-CM | POA: Insufficient documentation

## 2022-08-02 DIAGNOSIS — Y998 Other external cause status: Secondary | ICD-10-CM | POA: Diagnosis not present

## 2022-08-02 DIAGNOSIS — I82611 Acute embolism and thrombosis of superficial veins of right upper extremity: Secondary | ICD-10-CM | POA: Diagnosis not present

## 2022-08-02 MED ORDER — APIXABAN 5 MG PO TABS: 5.0000 mg | ORAL_TABLET | Freq: Two times a day (BID) | ORAL | 1 refills | Status: DC

## 2022-08-02 NOTE — Discharge Instructions (Signed)
Contact a health care provider if: You miss a dose of your blood thinner, if applicable. Your symptoms do not improve. You have unusual bruising. You have nausea, vomiting, or diarrhea that lasts for more than a day. Get help right away if: You are breathing fast or have chest pain. You have blood in your vomit, urine, or stool. You have severe pain in your affected arm or leg or new pain in any arm or leg. You have light-headedness, dizziness, a severe headache, or confusion.

## 2022-08-02 NOTE — ED Triage Notes (Signed)
Pt here from home with c/o right arm pain a, pt had recent surgery and has had blood clots in the past

## 2022-08-02 NOTE — ED Provider Notes (Signed)
[name] Provider Note   CSN: [id number] Arrival date & time: 08/02/22  1506     History  Chief Complaint  Patient presents with   Arm Injury    Michele [name] is a 56 y.o. female with chief complaint of right medial arm pain and swelling.  She has a past medical history of DVT after hospitalization due to colonic perforation 10 days after her hysterectomy requiring open laparotomy and wound VAC.  She ended up having a ostomy placement.  She was then noted to have a DVT in the left lower extremity and placed on Eliquis.  Patient has been off Eliquis for some time.  She had her ostomy taken down at the end of last month and had multiple IV attempts the medial antecubital fossa of the left right arm.  She also had her IV placed in the hand.  She noticed some pain and swelling yesterday and had progressively worsening redness pain and swelling with a ropey cordlike sensation of the medial arm starting today.  She denies chest pain or shortness of breath and is not on any anticoagulation   Arm Injury      Home Medications Prior to Admission medications   Medication Sig Start Date End Date Taking? Authorizing Provider  amphetamine-dextroamphetamine (ADDERALL XR) 30 MG 24 hr capsule Take 1 capsule (30 mg total) by mouth daily. 07/07/22   Orma Render, NP  ascorbic acid (VITAMIN C) 500 MG tablet Take 1 tablet (500 mg total) by mouth 2 (two) times daily. Patient taking differently: Take 500 mg by mouth 3 (three) times a week. 12/05/21   Jill Alexanders, PA-C  citalopram (CELEXA) 20 MG tablet Take 1 tablet (20 mg total) by mouth daily. 04/17/22   Early, Coralee Pesa, NP  clonazePAM (KLONOPIN) 1 MG tablet Take 0.5-1 tablets (0.5-1 mg total) by mouth 2 (two) times daily as needed (severe panic symptoms). Patient taking differently: Take 1 mg by mouth daily. 04/17/22   Orma Render, NP  ferrous sulfate 325 (65 FE) MG tablet Take 1 tablet (325 mg total) by mouth  daily with breakfast. Patient not taking: Reported on 06/27/2022 12/06/21 06/27/22  Jill Alexanders, PA-C  fluticasone [facility]) 50 MCG/ACT nasal spray Place 1 spray into both nostrils daily as needed for allergies or rhinitis.    [provider]  melatonin 5 MG TABS Take 5 mg by mouth at bedtime as needed (sleep).    [provider]  methocarbamol 1000 MG TABS Take 500 mg by mouth every 6 (six) hours as needed for muscle spasms. 07/11/22   Michael Boston, MD  mineral oil-hydrophilic petrolatum (AQUAPHOR) ointment Apply 1 Application topically as needed for irritation.    [provider]  mometasone (ELOCON) 0.1 % cream Apply 1 Application topically as needed for itching. Patient not taking: Reported on 06/27/2022 04/17/22   Early, Coralee Pesa, NP  Pediatric Multivitamins-Iron (FLINTSTONES PLUS EXTRA IRON PO) Take 1 tablet by mouth daily.    [provider]  polyethylene glycol (MIRALAX / GLYCOLAX) 17 g packet Take 17 g by mouth 2 (two) times daily as needed. Patient taking differently: Take 17 g by mouth every evening. 12/05/21   Jill Alexanders, PA-C  rosuvastatin (CRESTOR) 5 MG tablet Take 1 tablet (5 mg total) by mouth daily. 04/17/22   Orma Render, NP  traMADol (ULTRAM) 50 MG tablet Take 1-2 tablets (50-100 mg total) by mouth every 6 (six) hours as needed for moderate pain  or severe pain. 07/09/22   Michael Boston, MD  traMADol (ULTRAM) 50 MG tablet Take 1-2 tablets (50-100 mg total) by mouth every 6 (six) hours as needed for moderate pain. 07/13/22   Autumn Messing III, MD  VITAMIN D, CHOLECALCIFEROL, PO Take 1 capsule by mouth 3 (three) times a week.    [provider]      Allergies    Atorvastatin and Sulfa antibiotics    Review of Systems   Review of Systems  Physical Exam Updated Vital Signs BP 118/76 (BP Location: Left Arm)   Pulse 84   Temp 97.8 F (36.6 C) (Oral)   Resp 18   LMP 10/07/2021 (Approximate)   SpO2 100%  Physical  Exam Vitals and nursing note reviewed.  Constitutional:      General: She is not in acute distress.    Appearance: She is well-developed. She is not diaphoretic.  HENT:     Head: Normocephalic and atraumatic.     Right Ear: External ear normal.     Left Ear: External ear normal.     Nose: Nose normal.     Mouth/Throat:     Mouth: Mucous membranes are moist.  Eyes:     General: No scleral icterus.    Conjunctiva/sclera: Conjunctivae normal.  Cardiovascular:     Rate and Rhythm: Normal rate and regular rhythm.     Heart sounds: Normal heart sounds. No murmur heard.    No friction rub. No gallop.  Pulmonary:     Effort: Pulmonary effort is normal. No respiratory distress.     Breath sounds: Normal breath sounds.  Abdominal:     General: Bowel sounds are normal. There is no distension.     Palpations: Abdomen is soft. There is no mass.     Tenderness: There is no abdominal tenderness. There is no guarding.  Musculoskeletal:     Cervical back: Normal range of motion.     Comments: Right medial arm red, warm, tender, ropey cordlike region palpable.  Skin:    General: Skin is warm and dry.  Neurological:     Mental Status: She is alert and oriented to person, place, and time.  Psychiatric:        Behavior: Behavior normal.     ED Results / Procedures / Treatments   Labs (all labs ordered are listed, but only abnormal results are displayed) Labs Reviewed - No data to display  EKG None  Radiology US Venous Img Upper Uni Right(DVT)  Result Date: 08/02/2022 CLINICAL DATA:  Pain along the medial portion of the right elbow. History of recent surgery with IV in the right hand. History of prior left lower extremity DVT patient is no longer on anticoagulation. EXAM: RIGHT UPPER EXTREMITY VENOUS DOPPLER ULTRASOUND TECHNIQUE: Gray-scale sonography with graded compression, as well as color Doppler and duplex ultrasound were performed to evaluate the upper extremity deep venous system  from the level of the subclavian vein and including the jugular, axillary, basilic, radial, ulnar and upper cephalic vein. Spectral Doppler was utilized to evaluate flow at rest and with distal augmentation maneuvers. COMPARISON:  None Available. FINDINGS: Contralateral Subclavian Vein: Respiratory phasicity is normal and symmetric with the symptomatic side. No evidence of thrombus. Normal compressibility. Internal Jugular Vein: No evidence of thrombus. Normal compressibility, respiratory phasicity and response to augmentation. Subclavian Vein: No evidence of thrombus. Normal compressibility, respiratory phasicity and response to augmentation. Axillary Vein: No evidence of thrombus. Normal compressibility, respiratory phasicity and response to augmentation. Cephalic  Vein: No evidence of thrombus. Normal compressibility, respiratory phasicity and response to augmentation. Basilic Vein: The basilic vein is noncompressible from the level of the mid upper arm to the mid forearm. Echogenic occlusive thrombus is seen within the basilic vein. Brachial Veins: No evidence of thrombus. Normal compressibility, respiratory phasicity and response to augmentation. Radial Veins: No evidence of thrombus. Normal compressibility, respiratory phasicity and response to augmentation. Ulnar Veins: No evidence of thrombus. Normal compressibility, respiratory phasicity and response to augmentation. Venous Reflux:  None visualized. Other Findings:  None visualized. IMPRESSION: 1. Occlusive superficial venous thrombus in the basilic vein, from the level of the mid right upper arm to right forearm. 2. No evidence of deep venous thrombosis within the right upper extremity. Electronically Signed   By: Ileana Roup M.D.   On: 08/02/2022 17:18    Procedures Procedures    Medications Ordered in ED Medications - No data to display  ED Course/ Medical Decision Making/ A&P                           Medical Decision Making 66-year-old  female here with right medial arm pain, no evidence of cellulitis or injury.  She does appear to have acute thrombophlebitis about a month after having IV access in his arm.  In her history and the length of time for the development of this acute superficial clot I think it would be prudent to begin her again on her Eliquis and have her follow-up in the DVT clinic.  This was discussed with attending physician Dr. Maryan Rued who is in agreement.  She already has a bottle of Eliquis leftover from her previous DVT and will begin on 10 mg twice daily followed by 5 mg twice daily after 7 days of treatment.  She has no other signs of infection no concern for pulmonary embolus and appears otherwise appropriate for discharge at this time.  I visualized and interpreted ultrasound images of the right upper extremity which show an occlusive superficial thrombus of the basilic vein from the mid right forearm to the mid upper arm  Risk Prescription drug management.          Final Clinical Impression(s) / ED Diagnoses Final diagnoses:  Thrombophlebitis arm    Rx / DC Orders ED Discharge Orders     None         Margarita Mail, PA-C 08/02/22 2327    Blanchie Dessert, MD 08/03/22 1551

## 2022-08-14 ENCOUNTER — Encounter: Payer: Self-pay | Admitting: Surgery

## 2022-08-14 ENCOUNTER — Ambulatory Visit (INDEPENDENT_AMBULATORY_CARE_PROVIDER_SITE_OTHER): Payer: BC Managed Care – PPO | Admitting: Surgery

## 2022-08-14 VITALS — BP 126/79 | HR 81 | Temp 98.3°F | Resp 18 | Ht 60.0 in | Wt 158.9 lb

## 2022-08-14 DIAGNOSIS — I809 Phlebitis and thrombophlebitis of unspecified site: Secondary | ICD-10-CM

## 2022-08-14 NOTE — Progress Notes (Signed)
[facility]  Patient name: Michele [name] MRN: [id number] DOB: [birth year] Sex: female   REQUESTING PROVIDER:    Margarita Mail   REASON FOR CONSULT:    Left basilic vein thrombus  HISTORY OF PRESENT ILLNESS:   Michele [name] is a 57 y.o. 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  This was after a hysterectomy.  She underwent colostomy takedown on 07/09/2022.  She did have a lower extremity DVT following this and completed a course of Eliquis.  She presented to the emergency department on 08/02/2022 with pain and swelling with redness in her right arm.  She felt like this was near an area where a peripheral IV was attempted.  She was diagnosed with a right basilic vein thrombosis.  Because of her history, she began taking Eliquis that she had from her original DVT.  She is here today for further management.  She states that she is not having any further pain or swelling in her arm and that it is back to normal.  She has not had any unprovoked DVTs.   PAST MEDICAL HISTORY    Past Medical History:  Diagnosis Date   Allergy    Anemia    Anxiety    Delayed perforation of colon s/p [facility] colectomy/ostomy March 20213 11/05/2021   Depression    Hyperlipidemia    Pre-diabetes      FAMILY HISTORY   Family History  Problem Relation Age of Onset   Colon polyps Mother    Heart attack Father    Hypertension Sister    Colon cancer Neg Hx    Esophageal cancer Neg Hx    Rectal cancer Neg Hx    Stomach cancer Neg Hx     SOCIAL HISTORY:   Social History   Socioeconomic History   Marital status: Divorced    Spouse name: Not on file   Number of children: Not on file   Years of education: Not on file   Highest education level: Not on file  Occupational History   Not on file  Tobacco Use   Smoking status: Former    Packs/day: 0.50    Years:  15.00    Total pack years: 7.50    Types: Cigarettes    Quit date: 2003    Years since quitting: 21.0   Smokeless tobacco: Never   Tobacco comments:    quit 2003  Vaping Use   Vaping Use: Never used  Substance and Sexual Activity   Alcohol use: No   Drug use: No   Sexual activity: Yes    Birth control/protection: Surgical    Comment: 2006  Other Topics Concern   Not on file  Social History Narrative   The pt is a homemaker -pt quit smoking cigarettes 2004    Social Determinants of Health   Financial Resource Strain: Not on file  Food Insecurity: Not on file  Transportation Needs: Not on file  Physical Activity: Not on file  Stress: Not on file  Social Connections: Not on file  Intimate Partner Violence: Not on file    ALLERGIES:    Allergies  Allergen Reactions   Atorvastatin Rash and Other (See Comments)    Muscle aches on Lipitor 20 mg and 40 mg     Sulfa Antibiotics Nausea And Vomiting, Other (See Comments) and Rash    CURRENT MEDICATIONS:    Current Outpatient Medications  Medication Sig Dispense Refill  amphetamine-dextroamphetamine (ADDERALL XR) 30 MG 24 hr capsule Take 1 capsule (30 mg total) by mouth daily. 30 capsule 0   apixaban (ELIQUIS) 5 MG TABS tablet Take 1 tablet (5 mg total) by mouth 2 (two) times daily. 60 tablet 1   ascorbic acid (VITAMIN C) 500 MG tablet Take 1 tablet (500 mg total) by mouth 2 (two) times daily. (Patient taking differently: Take 500 mg by mouth 3 (three) times a week.) 60 tablet 0   citalopram (CELEXA) 20 MG tablet Take 1 tablet (20 mg total) by mouth daily. 90 tablet 3   clonazePAM (KLONOPIN) 1 MG tablet Take 0.5-1 tablets (0.5-1 mg total) by mouth 2 (two) times daily as needed (severe panic symptoms). (Patient taking differently: Take 1 mg by mouth daily.) 60 tablet 2   ferrous sulfate 325 (65 FE) MG tablet Take 1 tablet (325 mg total) by mouth daily with breakfast. (Patient not taking: Reported on 06/27/2022) 30 tablet 0    fluticasone (FLONASE) 50 MCG/ACT nasal spray Place 1 spray into both nostrils daily as needed for allergies or rhinitis.     melatonin 5 MG TABS Take 5 mg by mouth at bedtime as needed (sleep).     methocarbamol 1000 MG TABS Take 500 mg by mouth every 6 (six) hours as needed for muscle spasms. 20 tablet 1   mineral oil-hydrophilic petrolatum (AQUAPHOR) ointment Apply 1 Application topically as needed for irritation.     mometasone (ELOCON) 0.1 % cream Apply 1 Application topically as needed for itching. (Patient not taking: Reported on 06/27/2022) 45 g 2   Pediatric Multivitamins-Iron (FLINTSTONES PLUS EXTRA IRON PO) Take 1 tablet by mouth daily.     polyethylene glycol (MIRALAX / GLYCOLAX) 17 g packet Take 17 g by mouth 2 (two) times daily as needed. (Patient taking differently: Take 17 g by mouth every evening.) 14 each 0   rosuvastatin (CRESTOR) 5 MG tablet Take 1 tablet (5 mg total) by mouth daily. 90 tablet 3   traMADol (ULTRAM) 50 MG tablet Take 1-2 tablets (50-100 mg total) by mouth every 6 (six) hours as needed for moderate pain or severe pain. 20 tablet 0   traMADol (ULTRAM) 50 MG tablet Take 1-2 tablets (50-100 mg total) by mouth every 6 (six) hours as needed for moderate pain. 20 tablet 1   VITAMIN D, CHOLECALCIFEROL, PO Take 1 capsule by mouth 3 (three) times a week.     No current facility-administered medications for this visit.    REVIEW OF SYSTEMS:   '[X]'$  denotes positive finding, '[ ]'$  denotes negative finding Cardiac  Comments:  Chest pain or chest pressure:    Shortness of breath upon exertion:    Short of breath when lying flat:    Irregular heart rhythm:        Vascular    Pain in calf, thigh, or hip brought on by ambulation:    Pain in feet at night that wakes you up from your sleep:     Blood clot in your veins:    Leg swelling:         Pulmonary    Oxygen at home:    Productive cough:     Wheezing:         Neurologic    Sudden weakness in arms or legs:      Sudden numbness in arms or legs:     Sudden onset of difficulty speaking or slurred speech:    Temporary loss of vision in one eye:  Problems with dizziness:         Gastrointestinal    Blood in stool:      Vomited blood:         Genitourinary    Burning when urinating:     Blood in urine:        Psychiatric    Major depression:         Hematologic    Bleeding problems:    Problems with blood clotting too easily:        Skin    Rashes or ulcers:        Constitutional    Fever or chills:     PHYSICAL EXAM:   There were no vitals filed for this visit.  GENERAL: The patient is a well-nourished female, in no acute distress. The vital signs are documented above. CARDIAC: There is a regular rate and rhythm.  VASCULAR: Palpable right radial and brachial pulse.  No edema.  No palpable cord. PULMONARY: Nonlabored respirations  MUSCULOSKELETAL: There are no major deformities or cyanosis. NEUROLOGIC: No focal weakness or paresthesias are detected. SKIN: There are no ulcers or rashes noted. PSYCHIATRIC: The patient has a normal affect.  STUDIES:   I reviewed the following ultrasound:  1. Occlusive superficial venous thrombus in the basilic vein, from the level of the mid right upper arm to right forearm. 2. No evidence of deep venous thrombosis within the right upper extremity. ASSESSMENT and PLAN   Right basilic vein thrombus following surgery.  This initially presented as pain and redness and a palpable cord.  The symptoms have now completely resolved.  She has been taking Eliquis which she had leftover from her initial postoperative DVT.  She has approximately 2 weeks left.  I have told her to continue taking her Eliquis until it runs out.  Then I have recommended taking 81 mg aspirin for an additional 3 months.  No further treatment will be necessary afterwards.  She will follow-up as needed   Leia Alf, MD, FACS [facility] [phone] Pager [phone]

## 2022-08-15 ENCOUNTER — Encounter: Payer: Self-pay | Admitting: Surgery

## 2022-08-26 DIAGNOSIS — F4323 Adjustment disorder with mixed anxiety and depressed mood: Secondary | ICD-10-CM | POA: Diagnosis not present

## 2022-09-08 DIAGNOSIS — F4323 Adjustment disorder with mixed anxiety and depressed mood: Secondary | ICD-10-CM | POA: Diagnosis not present

## 2022-09-09 ENCOUNTER — Ambulatory Visit: Payer: Self-pay | Admitting: Surgery

## 2022-09-09 DIAGNOSIS — Z9889 Other specified postprocedural states: Secondary | ICD-10-CM | POA: Insufficient documentation

## 2022-10-28 ENCOUNTER — Other Ambulatory Visit: Payer: Self-pay

## 2022-11-25 NOTE — Patient Instructions (Signed)
SURGICAL WAITING ROOM VISITATION  Patients having surgery or a procedure may have no more than 2 support people in the waiting area - these visitors may rotate.    Children under the age of 91 must have an adult with them who is not the patient.  Due to an increase in RSV and influenza rates and associated hospitalizations, children ages 42 and under may not visit patients in [facility] hospitals.  If the patient needs to stay at the hospital during part of their recovery, the visitor guidelines for inpatient rooms apply. Pre-op nurse will coordinate an appropriate time for 1 support person to accompany patient in pre-op.  This support person may not rotate.    Please refer to the [facility] website for the visitor guidelines for Inpatients (after your surgery is over and you are in a regular room).       Your procedure is scheduled on:  12/04/22    Report to [facility] Main Entrance    Report to admitting at   [phone]   Call this number if you have problems the morning of surgery [phone]   Do not eat food :After Midnight.   After Midnight you may have the following liquids until __ 0430____ Am DAY OF SURGERY  Water Non-Citrus Juices (without pulp, NO RED-Apple, White grape, White cranberry) Black Coffee (NO MILK/CREAM OR CREAMERS, sugar ok)  Clear Tea (NO MILK/CREAM OR CREAMERS, sugar ok) regular and decaf                             Plain Jell-O (NO RED)                                           Fruit ices (not with fruit pulp, NO RED)                                     Popsicles (NO RED)                                                               Sports drinks like Gatorade (NO RED)              Drink 2 Ensure/G2 drinks AT 10:00 PM the night before surgery.        The day of surgery:  Drink ONE (1) Pre-Surgery Clear Ensure or G2 at  0430AM ( have completed by )  the morning of surgery. Drink in one sitting. Do not sip.  This drink was given to you during  your hospital  pre-op appointment visit. Nothing else to drink after completing the  Pre-Surgery Clear Ensure or G2.          If you have questions, please contact your surgeon's office.      Oral Hygiene is also important to reduce your risk of infection.  Remember - BRUSH YOUR TEETH THE MORNING OF SURGERY WITH YOUR REGULAR TOOTHPASTE  DENTURES WILL BE REMOVED PRIOR TO SURGERY PLEASE DO NOT APPLY "Poly grip" OR ADHESIVES!!!   Do NOT smoke after Midnight   Take these medicines the morning of surgery with A SIP OF WATER:   DO NOT TAKE ANY ORAL DIABETIC MEDICATIONS DAY OF YOUR SURGERY  Bring CPAP mask and tubing day of surgery.                              You may not have any metal on your body including hair pins, jewelry, and body piercing             Do not wear make-up, lotions, powders, perfumes/cologne, or deodorant  Do not wear nail polish including gel and S&S, artificial/acrylic nails, or any other type of covering on natural nails including finger and toenails. If you have artificial nails, gel coating, etc. that needs to be removed by a nail salon please have this removed prior to surgery or surgery may need to be canceled/ delayed if the surgeon/ anesthesia feels like they are unable to be safely monitored.   Do not shave  48 hours prior to surgery.               Men may shave face and neck.   Do not bring valuables to the hospital. [place].   Contacts, glasses, dentures or bridgework may not be worn into surgery.   Bring small overnight bag day of surgery.   DO NOT [place]. PHARMACY WILL DISPENSE MEDICATIONS LISTED ON YOUR MEDICATION LIST TO YOU DURING YOUR ADMISSION [place]!    Patients discharged on the day of surgery will not be allowed to drive home.  Someone NEEDS to stay with you for the first 24 hours after  anesthesia.   Special Instructions: Bring a copy of your healthcare power of attorney and living will documents the day of surgery if you haven't scanned them before.              Please read over the following fact sheets you were given: IF [place] [phone]   If you received a COVID test during your pre-op visit  it is requested that you wear a mask when out in public, stay away from anyone that may not be feeling well and notify your surgeon if you develop symptoms. If you test positive for Covid or have been in contact with anyone that has tested positive in the last 10 days please notify you surgeon.    [place] - Preparing for Surgery Before surgery, you can play an important role.  Because skin is not sterile, your skin needs to be as free of germs as possible.  You can reduce the number of germs on your skin by washing with CHG (chlorahexidine gluconate) soap before surgery.  CHG is an antiseptic cleaner which kills germs and bonds with the skin to continue killing germs even after washing. Please DO NOT use if you have an allergy to CHG or antibacterial soaps.  If your skin becomes reddened/irritated stop using the CHG and inform your nurse when you arrive at Short Stay. Do not shave (including legs and underarms) for at least  48 hours prior to the first CHG shower.  You may shave your face/neck. Please follow these instructions carefully:  1.  Shower with CHG Soap the night before surgery and the  morning of Surgery.  2.  If you choose to wash your hair, wash your hair first as usual with your  normal  shampoo.  3.  After you shampoo, rinse your hair and body thoroughly to remove the  shampoo.                           4.  Use CHG as you would any other liquid soap.  You can apply chg directly  to the skin and wash                       Gently with a scrungie or clean washcloth.  5.  Apply the CHG Soap to your body ONLY FROM THE  NECK DOWN.   Do not use on face/ open                           Wound or open sores. Avoid contact with eyes, ears mouth and genitals (private parts).                       Wash face,  Genitals (private parts) with your normal soap.             6.  Wash thoroughly, paying special attention to the area where your surgery  will be performed.  7.  Thoroughly rinse your body with warm water from the neck down.  8.  DO NOT shower/wash with your normal soap after using and rinsing off  the CHG Soap.                9.  Pat yourself dry with a clean towel.            10.  Wear clean pajamas.            11.  Place clean sheets on your bed the night of your first shower and do not  sleep with pets. Day of Surgery : Do not apply any lotions/deodorants the morning of surgery.  Please wear clean clothes to the hospital/surgery center.  FAILURE TO FOLLOW THESE INSTRUCTIONS MAY RESULT IN THE CANCELLATION OF YOUR SURGERY PATIENT SIGNATURE_________________________________  NURSE SIGNATURE__________________________________  ________________________________________________________________________

## 2022-11-25 NOTE — Progress Notes (Addendum)
Anesthesia Review:  PCP: Enid Skeens, NP LOV 04/17/22  Cardiologist : none  DR Myra Gianotti- LOV 08/14/22- Thrombophlebitis  Chest x-ray : EKG : 07/14/22  Echo : Stress test: Cardiac Cath :  Activity level: can do a flight of stairs without difficutly  Sleep Study/ CPAP : none  Fasting Blood Sugar :      / Checks Blood Sugar -- times a day:   Blood Thinner/ Instructions /Last Dose: ASA / Instructions/ Last Dose :    PreDiabetes- does not check glucose at home.   Hgba1c- 11/27/22-  5.9   PT was 10 minutes late for preop appt.   07/09/22- ostomy takedown    BMp with potassium of 5.3 on 11/27/22 routed ot Dr Michaell Cowing on 11/27/22.

## 2022-11-26 DIAGNOSIS — Z79899 Other long term (current) drug therapy: Secondary | ICD-10-CM | POA: Diagnosis not present

## 2022-11-26 DIAGNOSIS — F902 Attention-deficit hyperactivity disorder, combined type: Secondary | ICD-10-CM | POA: Diagnosis not present

## 2022-11-27 ENCOUNTER — Encounter (HOSPITAL_COMMUNITY): Payer: Self-pay

## 2022-11-27 ENCOUNTER — Other Ambulatory Visit: Payer: Self-pay

## 2022-11-27 ENCOUNTER — Encounter (HOSPITAL_COMMUNITY)
Admission: RE | Admit: 2022-11-27 | Discharge: 2022-11-27 | Disposition: A | Discharge status: Home / Self Care | Payer: BC Managed Care – PPO | Source: Ambulatory Visit | Attending: Surgery | Admitting: Surgery

## 2022-11-27 VITALS — BP 135/91 | HR 85 | Temp 98.7°F | Resp 16 | Ht 60.0 in | Wt 168.0 lb

## 2022-11-27 DIAGNOSIS — Z5181 Encounter for therapeutic drug level monitoring: Secondary | ICD-10-CM | POA: Diagnosis not present

## 2022-11-27 DIAGNOSIS — Z01812 Encounter for preprocedural laboratory examination: Secondary | ICD-10-CM | POA: Insufficient documentation

## 2022-11-27 DIAGNOSIS — Z01818 Encounter for other preprocedural examination: Secondary | ICD-10-CM

## 2022-11-27 DIAGNOSIS — Z79899 Other long term (current) drug therapy: Secondary | ICD-10-CM | POA: Diagnosis not present

## 2022-11-27 DIAGNOSIS — Z7901 Long term (current) use of anticoagulants: Secondary | ICD-10-CM | POA: Diagnosis not present

## 2022-11-27 LAB — BASIC METABOLIC PANEL
Anion gap: 7 (ref 5–15)
BUN: 17 mg/dL (ref 6–20)
CO2: 23 mmol/L (ref 22–32)
Calcium: 9.2 mg/dL (ref 8.9–10.3)
Chloride: 105 mmol/L (ref 98–111)
Creatinine, Ser: 0.8 mg/dL (ref 0.44–1.00)
GFR, Estimated: >60 mL/min (ref >60)
Glucose, Bld: 111 mg/dL — ABNORMAL HIGH (ref 70–99)
Potassium: 5.3 mmol/L — ABNORMAL HIGH (ref 3.5–5.1)
Sodium: 135 mmol/L (ref 135–145)

## 2022-11-27 LAB — CBC
HCT: 41.6 % (ref 36.0–46.0)
Hemoglobin: 13.1 g/dL (ref 12.0–15.0)
MCH: 25.8 pg — ABNORMAL LOW (ref 26.0–34.0)
MCHC: 31.5 g/dL (ref 30.0–36.0)
MCV: 82.1 fL (ref 80.0–100.0)
Platelets: 320 10*3/uL (ref 150–400)
RBC: 5.07 MIL/uL (ref 3.87–5.11)
RDW: 14.6 % (ref 11.5–15.5)
WBC: 4.5 10*3/uL (ref 4.0–10.5)
nRBC: 0 % (ref 0.0–0.2)

## 2022-11-27 LAB — GLUCOSE, CAPILLARY: Glucose-Capillary: 108 mg/dL — ABNORMAL HIGH (ref 70–99)

## 2022-11-27 LAB — HEMOGLOBIN A1C
Hgb A1c MFr Bld: 5.9 % — ABNORMAL HIGH (ref 4.8–5.6)
Mean Plasma Glucose: 122.63 mg/dL

## 2022-12-01 ENCOUNTER — Other Ambulatory Visit (HOSPITAL_BASED_OUTPATIENT_CLINIC_OR_DEPARTMENT_OTHER): Payer: Self-pay

## 2022-12-02 ENCOUNTER — Other Ambulatory Visit (HOSPITAL_BASED_OUTPATIENT_CLINIC_OR_DEPARTMENT_OTHER): Payer: Self-pay

## 2022-12-03 NOTE — Anesthesia Preprocedure Evaluation (Addendum)
Anesthesia Evaluation  Patient identified by MRN, date of birth, ID band Patient awake    Reviewed: Allergy & Precautions, H&P , NPO status , Patient's Chart, lab work & pertinent test results  Airway Mallampati: II  TM Distance: >3 FB Neck ROM: Full    Dental no notable dental hx. (+) Teeth Intact, Dental Advisory Given   Pulmonary neg pulmonary ROS, former smoker   Pulmonary exam normal        Cardiovascular hypertension, Pt. on medications negative cardio ROS  Rhythm:Regular Rate:Normal     Neuro/Psych  PSYCHIATRIC DISORDERS Anxiety Depression    negative neurological ROS  negative psych ROS   GI/Hepatic negative GI ROS, Neg liver ROS,,,S/p ex lap 3/28 with dehiscence here for closure originally TAH 10/26/21   Endo/Other  negative endocrine ROSdiabetes    Renal/GU negative Renal ROS  negative genitourinary   Musculoskeletal negative musculoskeletal ROS (+)    Abdominal  (+)  Abdomen: tender.   Peds negative pediatric ROS (+) ADHD Hematology negative hematology ROS (+) Blood dyscrasia, anemia   Anesthesia Other Findings   Reproductive/Obstetrics negative OB ROS                             Anesthesia Physical Anesthesia Plan  ASA: 3  Anesthesia Plan: General   Post-op Pain Management: Tylenol PO (pre-op)* and Celebrex PO (pre-op)*   Induction: Intravenous  PONV Risk Score and Plan: 3 and Ondansetron and Dexamethasone  Airway Management Planned: Oral ETT and LMA  Additional Equipment: None  Intra-op Plan:   Post-operative Plan: Extubation in OR  Informed Consent: I have reviewed the patients History and Physical, chart, labs and discussed the procedure including the risks, benefits and alternatives for the proposed anesthesia with the patient or authorized representative who has indicated his/her understanding and acceptance.     Dental advisory given  Plan Discussed  with: CRNA and Anesthesiologist  Anesthesia Plan Comments:         Anesthesia Quick Evaluation

## 2022-12-04 ENCOUNTER — Other Ambulatory Visit: Payer: Self-pay

## 2022-12-04 ENCOUNTER — Encounter (HOSPITAL_COMMUNITY)
Admission: RE | Disposition: A | Discharge status: Home / Self Care | Payer: Self-pay | Source: Home / Self Care | Attending: Surgery

## 2022-12-04 ENCOUNTER — Ambulatory Visit (HOSPITAL_COMMUNITY): Payer: BC Managed Care – PPO | Admitting: Anesthesiology

## 2022-12-04 ENCOUNTER — Encounter (HOSPITAL_COMMUNITY): Payer: Self-pay | Admitting: Surgery

## 2022-12-04 ENCOUNTER — Inpatient Hospital Stay (HOSPITAL_COMMUNITY)
Admission: RE | Admit: 2022-12-04 | Discharge: 2022-12-09 | DRG: 336 | Disposition: A | Discharge status: Home / Self Care | Payer: BC Managed Care – PPO | Attending: Surgery | Admitting: Surgery

## 2022-12-04 ENCOUNTER — Ambulatory Visit (HOSPITAL_COMMUNITY): Payer: BC Managed Care – PPO | Admitting: Physician Assistant

## 2022-12-04 DIAGNOSIS — E66812 Obesity, class 2: Secondary | ICD-10-CM | POA: Insufficient documentation

## 2022-12-04 DIAGNOSIS — Z90710 Acquired absence of both cervix and uterus: Secondary | ICD-10-CM | POA: Diagnosis not present

## 2022-12-04 DIAGNOSIS — F411 Generalized anxiety disorder: Secondary | ICD-10-CM | POA: Diagnosis present

## 2022-12-04 DIAGNOSIS — E119 Type 2 diabetes mellitus without complications: Secondary | ICD-10-CM

## 2022-12-04 DIAGNOSIS — Z933 Colostomy status: Secondary | ICD-10-CM

## 2022-12-04 DIAGNOSIS — Z8249 Family history of ischemic heart disease and other diseases of the circulatory system: Secondary | ICD-10-CM

## 2022-12-04 DIAGNOSIS — Z6832 Body mass index (BMI) 32.0-32.9, adult: Secondary | ICD-10-CM

## 2022-12-04 DIAGNOSIS — F410 Panic disorder [episodic paroxysmal anxiety] without agoraphobia: Secondary | ICD-10-CM | POA: Diagnosis present

## 2022-12-04 DIAGNOSIS — E669 Obesity, unspecified: Secondary | ICD-10-CM | POA: Diagnosis not present

## 2022-12-04 DIAGNOSIS — F339 Major depressive disorder, recurrent, unspecified: Secondary | ICD-10-CM | POA: Diagnosis present

## 2022-12-04 DIAGNOSIS — E785 Hyperlipidemia, unspecified: Secondary | ICD-10-CM | POA: Diagnosis not present

## 2022-12-04 DIAGNOSIS — Z888 Allergy status to other drugs, medicaments and biological substances status: Secondary | ICD-10-CM

## 2022-12-04 DIAGNOSIS — Z01818 Encounter for other preprocedural examination: Secondary | ICD-10-CM

## 2022-12-04 DIAGNOSIS — K660 Peritoneal adhesions (postprocedural) (postinfection): Secondary | ICD-10-CM | POA: Diagnosis not present

## 2022-12-04 DIAGNOSIS — Z86718 Personal history of other venous thrombosis and embolism: Secondary | ICD-10-CM

## 2022-12-04 DIAGNOSIS — K430 Incisional hernia with obstruction, without gangrene: Secondary | ICD-10-CM | POA: Diagnosis present

## 2022-12-04 DIAGNOSIS — F900 Attention-deficit hyperactivity disorder, predominantly inattentive type: Secondary | ICD-10-CM | POA: Diagnosis not present

## 2022-12-04 DIAGNOSIS — Z79899 Other long term (current) drug therapy: Secondary | ICD-10-CM | POA: Diagnosis not present

## 2022-12-04 DIAGNOSIS — Z9079 Acquired absence of other genital organ(s): Secondary | ICD-10-CM | POA: Diagnosis not present

## 2022-12-04 DIAGNOSIS — Z882 Allergy status to sulfonamides status: Secondary | ICD-10-CM | POA: Diagnosis not present

## 2022-12-04 DIAGNOSIS — I10 Essential (primary) hypertension: Secondary | ICD-10-CM | POA: Diagnosis not present

## 2022-12-04 DIAGNOSIS — Z87891 Personal history of nicotine dependence: Secondary | ICD-10-CM | POA: Diagnosis not present

## 2022-12-04 DIAGNOSIS — Z79890 Hormone replacement therapy (postmenopausal): Secondary | ICD-10-CM

## 2022-12-04 DIAGNOSIS — K432 Incisional hernia without obstruction or gangrene: Principal | ICD-10-CM | POA: Diagnosis present

## 2022-12-04 DIAGNOSIS — Z83719 Family history of colon polyps, unspecified: Secondary | ICD-10-CM | POA: Diagnosis not present

## 2022-12-04 HISTORY — PX: INCISIONAL HERNIA REPAIR: SHX193

## 2022-12-04 HISTORY — DX: Incisional hernia with obstruction, without gangrene: K43.0

## 2022-12-04 LAB — GLUCOSE, CAPILLARY: Glucose-Capillary: 173 mg/dL — ABNORMAL HIGH (ref 70–99)

## 2022-12-04 SURGERY — REPAIR, HERNIA, INCISIONAL
Anesthesia: General

## 2022-12-04 MED ORDER — LACTATED RINGERS IV SOLN: INTRAVENOUS | Status: DC | PRN

## 2022-12-04 MED ORDER — GABAPENTIN 300 MG PO CAPS
300.0000 mg | ORAL_CAPSULE | ORAL | Status: AC
  Administered 2022-12-04: 300 mg via ORAL
  Filled 2022-12-04: qty 1

## 2022-12-04 MED ORDER — TRAMADOL HCL 50 MG PO TABS: 50.0000 mg | ORAL_TABLET | Freq: Four times a day (QID) | ORAL | Status: DC | PRN

## 2022-12-04 MED ORDER — ACETAMINOPHEN 500 MG PO TABS: 1000.0000 mg | ORAL_TABLET | Freq: Once | ORAL | Status: DC

## 2022-12-04 MED ORDER — ONDANSETRON HCL 4 MG/2ML IJ SOLN
INTRAMUSCULAR | Status: AC
  Filled 2022-12-04: qty 2

## 2022-12-04 MED ORDER — POLYETHYLENE GLYCOL 3350 17 G PO PACK
17.0000 g | PACK | Freq: Two times a day (BID) | ORAL | Status: DC
  Administered 2022-12-04: 17 g via ORAL
  Filled 2022-12-04: qty 1

## 2022-12-04 MED ORDER — SODIUM CHLORIDE 0.9 % IV SOLN: 250.0000 mL | INTRAVENOUS | Status: DC | PRN

## 2022-12-04 MED ORDER — ONDANSETRON HCL 4 MG/2ML IJ SOLN
INTRAMUSCULAR | Status: DC | PRN
  Administered 2022-12-04: 4 mg via INTRAVENOUS

## 2022-12-04 MED ORDER — 0.9 % SODIUM CHLORIDE (POUR BTL) OPTIME
TOPICAL | Status: DC | PRN
  Administered 2022-12-04: 1000 mL

## 2022-12-04 MED ORDER — METOPROLOL TARTRATE 5 MG/5ML IV SOLN: 5.0000 mg | Freq: Four times a day (QID) | INTRAVENOUS | Status: DC | PRN

## 2022-12-04 MED ORDER — BISACODYL 10 MG RE SUPP: 10.0000 mg | Freq: Two times a day (BID) | RECTAL | Status: DC | PRN

## 2022-12-04 MED ORDER — BUPIVACAINE LIPOSOME 1.3 % IJ SUSP: 20.0000 mL | Freq: Once | INTRAMUSCULAR | Status: DC

## 2022-12-04 MED ORDER — SODIUM CHLORIDE 0.9% FLUSH
3.0000 mL | Freq: Two times a day (BID) | INTRAVENOUS | Status: DC
  Administered 2022-12-04 – 2022-12-07 (×6): 3 mL via INTRAVENOUS

## 2022-12-04 MED ORDER — LACTATED RINGERS IV SOLN: INTRAVENOUS | Status: DC

## 2022-12-04 MED ORDER — LIP MEDEX EX OINT
TOPICAL_OINTMENT | CUTANEOUS | Status: AC
  Filled 2022-12-04: qty 7

## 2022-12-04 MED ORDER — METHOCARBAMOL 1000 MG/10ML IJ SOLN: 1000.0000 mg | Freq: Four times a day (QID) | INTRAVENOUS | Status: DC | PRN

## 2022-12-04 MED ORDER — AMPHETAMINE-DEXTROAMPHET ER 5 MG PO CP24
15.0000 mg | ORAL_CAPSULE | Freq: Every day | ORAL | Status: DC
  Filled 2022-12-04 (×5): qty 1

## 2022-12-04 MED ORDER — MIDAZOLAM HCL 5 MG/5ML IJ SOLN
INTRAMUSCULAR | Status: DC | PRN
  Administered 2022-12-04: 2 mg via INTRAVENOUS

## 2022-12-04 MED ORDER — PROCHLORPERAZINE MALEATE 10 MG PO TABS: 10.0000 mg | ORAL_TABLET | Freq: Four times a day (QID) | ORAL | Status: DC | PRN

## 2022-12-04 MED ORDER — ALBUMIN HUMAN 5 % IV SOLN: INTRAVENOUS | Status: DC | PRN

## 2022-12-04 MED ORDER — CHLORHEXIDINE GLUCONATE CLOTH 2 % EX PADS: 6.0000 | MEDICATED_PAD | Freq: Once | CUTANEOUS | Status: DC

## 2022-12-04 MED ORDER — FLUTICASONE PROPIONATE 50 MCG/ACT NA SUSP
1.0000 | Freq: Every day | NASAL | Status: DC
  Administered 2022-12-06 – 2022-12-09 (×4): 1 via NASAL
  Filled 2022-12-04: qty 16

## 2022-12-04 MED ORDER — ACETAMINOPHEN 325 MG PO TABS: 325.0000 mg | ORAL_TABLET | ORAL | Status: DC | PRN

## 2022-12-04 MED ORDER — DEXAMETHASONE SODIUM PHOSPHATE 10 MG/ML IJ SOLN
INTRAMUSCULAR | Status: AC
  Filled 2022-12-04: qty 1

## 2022-12-04 MED ORDER — PHENOL 1.4 % MT LIQD: 2.0000 | OROMUCOSAL | Status: DC | PRN

## 2022-12-04 MED ORDER — MEPERIDINE HCL 50 MG/ML IJ SOLN
6.2500 mg | INTRAMUSCULAR | Status: DC | PRN
  Administered 2022-12-04: 12.5 mg via INTRAVENOUS

## 2022-12-04 MED ORDER — MOMETASONE FUROATE 0.1 % EX CREA: 1.0000 | TOPICAL_CREAM | CUTANEOUS | Status: DC | PRN

## 2022-12-04 MED ORDER — ACETAMINOPHEN 500 MG PO TABS
1000.0000 mg | ORAL_TABLET | Freq: Four times a day (QID) | ORAL | Status: DC
  Administered 2022-12-04 – 2022-12-09 (×16): 1000 mg via ORAL
  Filled 2022-12-04 (×16): qty 2

## 2022-12-04 MED ORDER — OXYCODONE HCL 5 MG/5ML PO SOLN: 5.0000 mg | Freq: Once | ORAL | Status: DC | PRN

## 2022-12-04 MED ORDER — FENTANYL CITRATE (PF) 100 MCG/2ML IJ SOLN
INTRAMUSCULAR | Status: DC | PRN
  Administered 2022-12-04: 25 ug via INTRAVENOUS
  Administered 2022-12-04: 100 ug via INTRAVENOUS
  Administered 2022-12-04: 25 ug via INTRAVENOUS

## 2022-12-04 MED ORDER — FENTANYL CITRATE PF 50 MCG/ML IJ SOSY
PREFILLED_SYRINGE | INTRAMUSCULAR | Status: AC
  Administered 2022-12-04: 50 ug via INTRAVENOUS
  Filled 2022-12-04: qty 1

## 2022-12-04 MED ORDER — ROSUVASTATIN CALCIUM 5 MG PO TABS
5.0000 mg | ORAL_TABLET | Freq: Every day | ORAL | Status: DC
  Administered 2022-12-04 – 2022-12-09 (×6): 5 mg via ORAL
  Filled 2022-12-04 (×6): qty 1

## 2022-12-04 MED ORDER — ONDANSETRON HCL 4 MG/2ML IJ SOLN: 4.0000 mg | Freq: Once | INTRAMUSCULAR | Status: DC | PRN

## 2022-12-04 MED ORDER — ACETAMINOPHEN 500 MG PO TABS
1000.0000 mg | ORAL_TABLET | ORAL | Status: AC
  Administered 2022-12-04: 1000 mg via ORAL
  Filled 2022-12-04: qty 2

## 2022-12-04 MED ORDER — CITALOPRAM HYDROBROMIDE 20 MG PO TABS
20.0000 mg | ORAL_TABLET | Freq: Every day | ORAL | Status: DC
  Administered 2022-12-04 – 2022-12-09 (×6): 20 mg via ORAL
  Filled 2022-12-04 (×6): qty 1

## 2022-12-04 MED ORDER — MAGNESIUM HYDROXIDE 400 MG/5ML PO SUSP: 30.0000 mL | Freq: Every day | ORAL | Status: DC | PRN

## 2022-12-04 MED ORDER — SIMETHICONE 80 MG PO CHEW
40.0000 mg | CHEWABLE_TABLET | Freq: Four times a day (QID) | ORAL | Status: DC | PRN
  Administered 2022-12-05: 40 mg via ORAL
  Filled 2022-12-04: qty 1

## 2022-12-04 MED ORDER — BUPIVACAINE LIPOSOME 1.3 % IJ SUSP
INTRAMUSCULAR | Status: DC | PRN
  Administered 2022-12-04: 60 mL

## 2022-12-04 MED ORDER — CEFAZOLIN SODIUM-DEXTROSE 2-4 GM/100ML-% IV SOLN
2.0000 g | INTRAVENOUS | Status: AC
  Administered 2022-12-04 (×2): 2 g via INTRAVENOUS
  Filled 2022-12-04: qty 100

## 2022-12-04 MED ORDER — POLYETHYLENE GLYCOL 3350 17 G PO PACK: 17.0000 g | PACK | Freq: Two times a day (BID) | ORAL | Status: DC | PRN

## 2022-12-04 MED ORDER — MEPERIDINE HCL 50 MG/ML IJ SOLN
INTRAMUSCULAR | Status: AC
  Filled 2022-12-04: qty 1

## 2022-12-04 MED ORDER — BUPIVACAINE LIPOSOME 1.3 % IJ SUSP
INTRAMUSCULAR | Status: AC
  Filled 2022-12-04: qty 20

## 2022-12-04 MED ORDER — LACTATED RINGERS IV BOLUS: 1000.0000 mL | Freq: Three times a day (TID) | INTRAVENOUS | Status: DC | PRN

## 2022-12-04 MED ORDER — SUGAMMADEX SODIUM 200 MG/2ML IV SOLN
INTRAVENOUS | Status: DC | PRN
  Administered 2022-12-04: 160 mg via INTRAVENOUS

## 2022-12-04 MED ORDER — PROCHLORPERAZINE EDISYLATE 10 MG/2ML IJ SOLN: 5.0000 mg | Freq: Four times a day (QID) | INTRAMUSCULAR | Status: DC | PRN

## 2022-12-04 MED ORDER — CHLORHEXIDINE GLUCONATE 0.12 % MT SOLN
15.0000 mL | Freq: Once | OROMUCOSAL | Status: AC
  Administered 2022-12-04: 15 mL via OROMUCOSAL

## 2022-12-04 MED ORDER — CLONAZEPAM 0.5 MG PO TABS: 0.5000 mg | ORAL_TABLET | Freq: Two times a day (BID) | ORAL | Status: DC | PRN

## 2022-12-04 MED ORDER — HYDROMORPHONE HCL 1 MG/ML IJ SOLN
0.5000 mg | INTRAMUSCULAR | Status: DC | PRN
  Administered 2022-12-04: 1 mg via INTRAVENOUS
  Filled 2022-12-04: qty 1

## 2022-12-04 MED ORDER — LIDOCAINE HCL 2 % IJ SOLN
INTRAMUSCULAR | Status: AC
  Filled 2022-12-04: qty 20

## 2022-12-04 MED ORDER — METHOCARBAMOL 500 MG PO TABS
500.0000 mg | ORAL_TABLET | Freq: Four times a day (QID) | ORAL | Status: DC | PRN
  Administered 2022-12-04 – 2022-12-05 (×2): 500 mg via ORAL
  Filled 2022-12-04: qty 1

## 2022-12-04 MED ORDER — ACETAMINOPHEN 160 MG/5ML PO SOLN: 325.0000 mg | ORAL | Status: DC | PRN

## 2022-12-04 MED ORDER — HYDROMORPHONE HCL 1 MG/ML IJ SOLN
INTRAMUSCULAR | Status: DC | PRN
  Administered 2022-12-04 (×3): .5 mg via INTRAVENOUS

## 2022-12-04 MED ORDER — ROCURONIUM BROMIDE 10 MG/ML (PF) SYRINGE
PREFILLED_SYRINGE | INTRAVENOUS | Status: DC | PRN
  Administered 2022-12-04: 20 mg via INTRAVENOUS
  Administered 2022-12-04: 15 mg via INTRAVENOUS
  Administered 2022-12-04: 100 mg via INTRAVENOUS
  Administered 2022-12-04: 10 mg via INTRAVENOUS
  Administered 2022-12-04: 20 mg via INTRAVENOUS

## 2022-12-04 MED ORDER — ROCURONIUM BROMIDE 10 MG/ML (PF) SYRINGE
PREFILLED_SYRINGE | INTRAVENOUS | Status: AC
  Filled 2022-12-04: qty 10

## 2022-12-04 MED ORDER — FENTANYL CITRATE (PF) 100 MCG/2ML IJ SOLN
INTRAMUSCULAR | Status: AC
  Filled 2022-12-04: qty 2

## 2022-12-04 MED ORDER — PROCHLORPERAZINE EDISYLATE 10 MG/2ML IJ SOLN: 5.0000 mg | INTRAMUSCULAR | Status: DC | PRN

## 2022-12-04 MED ORDER — BUPIVACAINE HCL (PF) 0.25 % IJ SOLN
INTRAMUSCULAR | Status: AC
  Filled 2022-12-04: qty 60

## 2022-12-04 MED ORDER — ONDANSETRON HCL 4 MG/2ML IJ SOLN: 4.0000 mg | Freq: Four times a day (QID) | INTRAMUSCULAR | Status: DC | PRN

## 2022-12-04 MED ORDER — DIPHENHYDRAMINE HCL 12.5 MG/5ML PO ELIX: 12.5000 mg | ORAL_SOLUTION | Freq: Four times a day (QID) | ORAL | Status: DC | PRN

## 2022-12-04 MED ORDER — PHENYLEPHRINE HCL-NACL 20-0.9 MG/250ML-% IV SOLN
INTRAVENOUS | Status: DC | PRN
  Administered 2022-12-04: 25 ug/min via INTRAVENOUS

## 2022-12-04 MED ORDER — CEFAZOLIN SODIUM-DEXTROSE 2-4 GM/100ML-% IV SOLN
2.0000 g | Freq: Three times a day (TID) | INTRAVENOUS | Status: AC
  Administered 2022-12-04: 2 g via INTRAVENOUS
  Filled 2022-12-04: qty 100

## 2022-12-04 MED ORDER — MELATONIN 5 MG PO TABS
5.0000 mg | ORAL_TABLET | Freq: Every evening | ORAL | Status: DC | PRN
  Filled 2022-12-04: qty 1

## 2022-12-04 MED ORDER — ORAL CARE MOUTH RINSE: 15.0000 mL | Freq: Once | OROMUCOSAL | Status: AC

## 2022-12-04 MED ORDER — ENSURE PRE-SURGERY PO LIQD: 296.0000 mL | Freq: Once | ORAL | Status: DC

## 2022-12-04 MED ORDER — METHOCARBAMOL 500 MG PO TABS
1000.0000 mg | ORAL_TABLET | Freq: Four times a day (QID) | ORAL | Status: DC | PRN
  Administered 2022-12-05 – 2022-12-07 (×4): 1000 mg via ORAL
  Filled 2022-12-04 (×5): qty 2

## 2022-12-04 MED ORDER — LIDOCAINE 2% (20 MG/ML) 5 ML SYRINGE
INTRAMUSCULAR | Status: DC | PRN
  Administered 2022-12-04: 1.5 mg/kg/h via INTRAVENOUS
  Administered 2022-12-04: 100 mg via INTRAVENOUS

## 2022-12-04 MED ORDER — LIDOCAINE HCL (PF) 2 % IJ SOLN
INTRAMUSCULAR | Status: AC
  Filled 2022-12-04: qty 5

## 2022-12-04 MED ORDER — MIDAZOLAM HCL 2 MG/2ML IJ SOLN
INTRAMUSCULAR | Status: AC
  Filled 2022-12-04: qty 2

## 2022-12-04 MED ORDER — PROPOFOL 10 MG/ML IV BOLUS
INTRAVENOUS | Status: AC
  Filled 2022-12-04: qty 20

## 2022-12-04 MED ORDER — SODIUM CHLORIDE 0.9% FLUSH: 3.0000 mL | INTRAVENOUS | Status: DC | PRN

## 2022-12-04 MED ORDER — MAGIC MOUTHWASH: 15.0000 mL | Freq: Four times a day (QID) | ORAL | Status: DC | PRN

## 2022-12-04 MED ORDER — FENTANYL CITRATE PF 50 MCG/ML IJ SOSY: 25.0000 ug | PREFILLED_SYRINGE | INTRAMUSCULAR | Status: DC | PRN

## 2022-12-04 MED ORDER — MENTHOL 3 MG MT LOZG
1.0000 | LOZENGE | OROMUCOSAL | Status: DC | PRN
  Administered 2022-12-04: 3 mg via ORAL
  Filled 2022-12-04: qty 9

## 2022-12-04 MED ORDER — ALUM & MAG HYDROXIDE-SIMETH 200-200-20 MG/5ML PO SUSP: 30.0000 mL | Freq: Four times a day (QID) | ORAL | Status: DC | PRN

## 2022-12-04 MED ORDER — CEFAZOLIN SODIUM-DEXTROSE 2-4 GM/100ML-% IV SOLN
INTRAVENOUS | Status: AC
  Filled 2022-12-04: qty 100

## 2022-12-04 MED ORDER — GABAPENTIN 100 MG PO CAPS
300.0000 mg | ORAL_CAPSULE | Freq: Three times a day (TID) | ORAL | Status: DC
  Administered 2022-12-04 – 2022-12-05 (×3): 300 mg via ORAL
  Filled 2022-12-04 (×6): qty 3

## 2022-12-04 MED ORDER — BISACODYL 10 MG RE SUPP
10.0000 mg | Freq: Every day | RECTAL | Status: DC | PRN
Start: 2022-12-04 — End: 2022-12-04

## 2022-12-04 MED ORDER — PROPOFOL 10 MG/ML IV BOLUS
INTRAVENOUS | Status: DC | PRN
  Administered 2022-12-04: 150 mg via INTRAVENOUS

## 2022-12-04 MED ORDER — ENOXAPARIN SODIUM 40 MG/0.4ML IJ SOSY
40.0000 mg | PREFILLED_SYRINGE | INTRAMUSCULAR | Status: DC
  Administered 2022-12-05 – 2022-12-06 (×2): 40 mg via SUBCUTANEOUS
  Filled 2022-12-04 (×2): qty 0.4

## 2022-12-04 MED ORDER — SODIUM CHLORIDE 0.9 % IV SOLN: 8.0000 mg | Freq: Four times a day (QID) | INTRAVENOUS | Status: DC | PRN

## 2022-12-04 MED ORDER — DEXAMETHASONE SODIUM PHOSPHATE 10 MG/ML IJ SOLN
INTRAMUSCULAR | Status: DC | PRN
  Administered 2022-12-04: 8 mg via INTRAVENOUS

## 2022-12-04 MED ORDER — HYDROMORPHONE HCL 2 MG/ML IJ SOLN
INTRAMUSCULAR | Status: AC
  Filled 2022-12-04: qty 1

## 2022-12-04 MED ORDER — OXYCODONE HCL 5 MG PO TABS: 5.0000 mg | ORAL_TABLET | Freq: Once | ORAL | Status: DC | PRN

## 2022-12-04 MED ORDER — ALBUMIN HUMAN 5 % IV SOLN
INTRAVENOUS | Status: AC
  Filled 2022-12-04: qty 250

## 2022-12-04 MED ORDER — ENSURE PRE-SURGERY PO LIQD: 592.0000 mL | Freq: Once | ORAL | Status: DC

## 2022-12-04 MED ORDER — DIPHENHYDRAMINE HCL 50 MG/ML IJ SOLN: 12.5000 mg | Freq: Four times a day (QID) | INTRAMUSCULAR | Status: DC | PRN

## 2022-12-04 MED ORDER — CELECOXIB 200 MG PO CAPS
200.0000 mg | ORAL_CAPSULE | Freq: Once | ORAL | Status: AC
  Administered 2022-12-04: 200 mg via ORAL
  Filled 2022-12-04: qty 1

## 2022-12-04 MED ORDER — OXYCODONE HCL 5 MG PO TABS
5.0000 mg | ORAL_TABLET | ORAL | Status: DC | PRN
  Administered 2022-12-04 – 2022-12-09 (×11): 10 mg via ORAL
  Filled 2022-12-04 (×13): qty 2

## 2022-12-04 MED ORDER — LIP MEDEX EX OINT
TOPICAL_OINTMENT | Freq: Two times a day (BID) | CUTANEOUS | Status: DC
  Administered 2022-12-05: 75 via TOPICAL
  Administered 2022-12-08: 1 via TOPICAL
  Filled 2022-12-04: qty 7

## 2022-12-04 SURGICAL SUPPLY — 39 items
APL PRP STRL LF DISP 70% ISPRP (MISCELLANEOUS) ×1
BAG COUNTER SPONGE SURGICOUNT (BAG) IMPLANT
BAG SPNG CNTER NS LX DISP (BAG)
BINDER ABDOMINAL 12 ML 46-62 (SOFTGOODS) IMPLANT
CHLORAPREP W/TINT 26 (MISCELLANEOUS) ×1 IMPLANT
COVER SURGICAL LIGHT HANDLE (MISCELLANEOUS) ×1 IMPLANT
DEVICE TROCAR PUNCTURE CLOSURE (ENDOMECHANICALS) IMPLANT
DRAIN CHANNEL 19F RND (DRAIN) IMPLANT
DRAPE LAPAROSCOPIC ABDOMINAL (DRAPES) ×1 IMPLANT
DRSG OPSITE POSTOP 4X12 (GAUZE/BANDAGES/DRESSINGS) IMPLANT
DRSG TEGADERM 2-3/8X2-3/4 SM (GAUZE/BANDAGES/DRESSINGS) IMPLANT
ELECT REM PT RETURN 15FT ADLT (MISCELLANEOUS) ×1 IMPLANT
EVACUATOR SILICONE 100CC (DRAIN) IMPLANT
GAUZE SPONGE 2X2 8PLY STRL LF (GAUZE/BANDAGES/DRESSINGS) IMPLANT
GLOVE ECLIPSE 8.0 STRL XLNG CF (GLOVE) ×1 IMPLANT
GLOVE INDICATOR 8.0 STRL GRN (GLOVE) ×1 IMPLANT
GOWN STRL REUS W/ TWL XL LVL3 (GOWN DISPOSABLE) ×3 IMPLANT
GOWN STRL REUS W/TWL XL LVL3 (GOWN DISPOSABLE) ×3
KIT BASIN OR (CUSTOM PROCEDURE TRAY) ×1 IMPLANT
KIT TURNOVER KIT A (KITS) IMPLANT
MARKER SKIN DUAL TIP RULER LAB (MISCELLANEOUS) ×1 IMPLANT
MESH VENTRALIGHT ST 12X14IN (Mesh General) IMPLANT
PACK GENERAL/GYN (CUSTOM PROCEDURE TRAY) ×1 IMPLANT
PAD POSITIONING PINK XL (MISCELLANEOUS) ×1 IMPLANT
SPIKE FLUID TRANSFER (MISCELLANEOUS) ×1 IMPLANT
STRIP CLOSURE SKIN 1/2X4 (GAUZE/BANDAGES/DRESSINGS) IMPLANT
SUT ETHIBOND NAB CT1 #1 30IN (SUTURE) IMPLANT
SUT MNCRL AB 4-0 PS2 18 (SUTURE) ×1 IMPLANT
SUT NOVA NAB DX-16 0-1 5-0 T12 (SUTURE) IMPLANT
SUT PDS AB 1 CT1 27 (SUTURE) IMPLANT
SUT PDS AB 1 TP1 96 (SUTURE) IMPLANT
SUT PROLENE 1 CT 1 30 (SUTURE) IMPLANT
SUT PROLENE 2 0 SH DA (SUTURE) IMPLANT
SUT VIC AB 2-0 SH 18 (SUTURE) IMPLANT
SUT VIC AB 2-0 SH 27 (SUTURE) ×5
SUT VIC AB 2-0 SH 27X BRD (SUTURE) IMPLANT
SYR 20ML LL LF (SYRINGE) ×1 IMPLANT
TOWEL OR 17X26 10 PK STRL BLUE (TOWEL DISPOSABLE) ×1 IMPLANT
TOWEL OR NON WOVEN STRL DISP B (DISPOSABLE) ×1 IMPLANT

## 2022-12-04 NOTE — Anesthesia Procedure Notes (Signed)
Procedure Name: Intubation Date/Time: 12/04/2022 7:15 AM  Performed by: Elisabeth Cara, CRNAPre-anesthesia Checklist: Patient identified, Emergency Drugs available, Suction available, Patient being monitored and Timeout performed Patient Re-evaluated:Patient Re-evaluated prior to induction Oxygen Delivery Method: Circle system utilized Preoxygenation: Pre-oxygenation with 100% oxygen Induction Type: IV induction Ventilation: Mask ventilation without difficulty Laryngoscope Size: Mac and 4 Grade View: Grade I Tube type: Oral Tube size: 7.5 mm Number of attempts: 1 Airway Equipment and Method: Stylet Placement Confirmation: ETT inserted through vocal cords under direct vision, positive ETCO2 and breath sounds checked- equal and bilateral Secured at: 22 cm Tube secured with: Tape Dental Injury: Teeth and Oropharynx as per pre-operative assessment

## 2022-12-04 NOTE — Op Note (Addendum)
12/04/2022  PATIENT:  Michele [name]  57 y.o. female  Patient Care Team: Early, Sung Amabile, NP as PCP - General (Nurse Practitioner) Karie Soda, MD as Consulting Physician (Colon and Rectal Surgery) Violeta Gelinas, MD as Consulting Physician (General Surgery) Essie Hart, MD (Inactive) as Referring Physician (Obstetrics and Gynecology) Mansouraty, Netty Starring., MD as Consulting Physician (Gastroenterology)  PRE-OPERATIVE DIAGNOSIS:  VENTRAL INCISIONAL ABDOMINAL WALL HERNIAE Dimensions of hernia by pre-op evaluation:  21cm x 12cm  POST-OPERATIVE DIAGNOSIS:  VENTRAL INCISIONAL ABDOMINAL WALL HERNIAE  Dimensions of hernia post-op:  21cm x 12cm  PROCEDURE:   LYSIS OF ADHESIONS x (1/3 case) COMPONENT SEPARATION (TRANSVERSUS ABDOMINIS RELEASE - BILATERAL) ABDOMINAL WALL RECONSTRUCTION WITH 35X30CM MESH TRANSVERSUS ABDOMINIS PLANE (TAP) BLOCK - BILATERAL  SURGEON:  Ardeth Sportsman, MD  ANESTHESIA:  General endotracheal intubation anesthesia (GETA)  Regional TRANSVERSUS ABDOMINIS PLANE (TAP) nerve block for perioperative & postoperative pain control provided with liposomal bupivacaine (Experel) mixed with 0.25% bupivacaine as a Bilateral TAP block x 30mL each side at the level of the transverse abdominis & preperitoneal spaces along the flank at the anterior axillary line, from subcostal ridge to iliac crest under laparoscopic guidance  Estimated Blood Loss (EBL):   Total I/O In: 3250 [I.V.:2800; IV Piggyback:450] Out: 385 [Urine:235; Blood:150].   (See anesthesia record)  Delay start of Pharmacological VTE agent (>24hrs) due to concerns of significant anemia, surgical blood loss, or risk of bleeding?:  no  DRAINS: 19Fr Blake drains x2  Subcutaneous drain exits left lower quadrant Retrorectus drain at level of mesh exits right lower quadrant  SPECIMEN:  Hernia sac (not sent)  DISPOSITION OF SPECIMEN:  Pathology and (not applicable)  COUNTS:  Sponge, needle, & instrument  counts CORRECT  PLAN OF CARE: Admit for overnight observation  PATIENT DISPOSITION:  PACU - hemodynamically stable.  INDICATION: Pleasant patient with numerous emergency abdominal surgeries colectomy and colostomy takedown who has developed a large incisional hernia.  Recommendation made for abdominal reconstruction with component separation excision of giant hernia sac.  Recommendation was made for surgical repair:   The anatomy & physiology of the abdominal wall was discussed. The pathophysiology of hernias was discussed. Natural history risks without surgery including progeressive enlargement, pain, incarceration & strangulation was discussed. Contributors to complications such as smoking, obesity, diabetes, prior surgery, etc were discussed.   I feel the risks of no intervention will lead to serious problems that outweigh the operative risks; therefore, I recommended surgery to reduce and repair the hernia. I explained laparoscopic techniques with possible need for an open approach. I noted the probable use of mesh to patch and/or buttress the hernia repair  Risks such as bleeding, infection, abscess, need for further treatment, heart attack, death, and other risks were discussed. I noted a good likelihood this will help address the problem. Goals of post-operative recovery were discussed as well. Possibility that this will not correct all symptoms was explained. I stressed the importance of low-impact activity, aggressive pain control, avoiding constipation, & not pushing through pain to minimize risk of post-operative chronic pain or injury. Possibility of reherniation especially with smoking, obesity, diabetes, immunosuppression, and other health conditions was discussed. We will work to minimize complications.  An educational handout further explaining the pathology & treatment options was given as well. Questions were answered. The patient expresses understanding & wishes to proceed with  surgery.   OR FINDINGS:   Patient had a 21 x 12 cm incisional hernia incarcerated with very dense adhesions of small  bowel colon and greater omentum.    Type of repair - Component separation repair - Transversus abdominis release (TAR) - bilateral  With mesh reinforcement   Name of mesh - Ventralight Bard polypropylene  Size of mesh - Height 35 cm, Width 30 cm  Orientation:  Diamond   Mesh overlap - 5-10 cm  Placement of mesh - retrorectus underlay   DESCRIPTION:   Informed consent was confirmed. The patient underwent general anaesthesia without difficulty. The patient was positioned appropriately. VTE prevention in place. The patient's abdomen was clipped, prepped, & draped in a sterile fashion. Surgical timeout confirmed our plan.   Gained entry through a midline incision through the large hernia sac.  Came down few millimeters with a scalpel until I breached into the giant hernia sac.  Opened up the skin more.  Annitta Needs off numerous fusions of small bowel and transverse colon and the giant hernia sac.  She did have some moderate loss of domain with large defect as noted above.  Because the fascia would not come together, I then proceeded to do component separation.  A transversus abdominis release (TAR) was performed on the RIGHT side.  The transversus abdominis muscle was identified within the posterior rectus sheath and incised vertically along its entire length, entering the pre-peritoneal or pre-transversalis fascia plane.  The peritoneum was subsequently peeled away from the underside of the divided transversus abdominis muscle.  This dissection was carried out laterally towards the retroperitoneum and was also carried out lateral to the colon posterior to the posterior axillary line..  The TAR accomplished additional medialization of the posterior rectus sheath with its attached peritoneum towards the midline to allow for visceral sac closure.  The TAR also provided further offset of  tension with advancement of the rectus muscle towards the midline, as it remained attached to the external and internal abdominal oblique muscles.  However, one side did not give enough relaxation to bring anterior fashion together.  Therefore, a transversus abdominis release (TAR) was performed on the LEFT side as well in a similar fashion.  With that, the fascia came towards midline much more easily.  Brought the transverse colon and what scant greater omentum remained to be in the midline.  Close some small defects in the peritoneum laterally and superiorly.  Then trimmed hernia sac and closed the midline peritoneum 2-0 Vicryl in running fashion.  Did irrigation and hemostasis was good.  Muscle and abdominal wall flaps viable.  Because of the giant defect requiring bilateral component separation side to reinforce the repair using polypropylene mesh.  Because her tissues were poor inside use a dual sided Ventralight 35 x 30 cm.  Placed d in the retrorectus preperitoneal space as a diamond with lateral corners going along the posterior axillary line flanks  The inferior corner went below the pubic rim anterior to the bladder wall that had been taken down.  The upper corner was subxiphoid and subcostal and epigastric regions.   I  placed #1 PDS stitches around the periphery 8 total.  2 at the sides of the corners.  I replaced the mesh back into the retrorectus space and brought the tails of these PDS corners using a Endo Close suture passer such that the lateral corners came out the posterior axillary line, the inferior tails were just above the pubis muscle and the superior corner extended substernal and medial costal regionsin the epigastric region.  Drain placed in the retrorectus space and secured with 2-0 Prolene.  Freed subcutaneous fat  off the anterior rectus fascia to have some better laxity and reapproximated the anterior rectus fascia vertically using looped #1 PDS in a running fashion, starting  from each corner.  Came together relatively well with some mild tension centrally.  Of note I did take a few bites of the Ventralight mesh with a running PDS suture to help centrally secure the mesh as well.  Taking care not to involve the peritoneum or bowel underneath.  Another 62 [place] Blake drain was placed in the subcutaneous tissues exiting the left lower quadrant secured with 2-0 Prolene.  Her subcutaneous tissues seem to come and better together vertically as she did not have much of a panniculus to do a more transverse panniculectomy.  Therefore I excised her old hernia sac skin and old scar in a biconcave fashion along with some medial abdominal wall skin and subcutaneous fat on both sides to have a more snug closure with good healthy skin bleeding flaps.  Hemostasis assured.  I closed the subcutaneous tissues using 2-0 Vicryl deep dermal interrupted sutures and 4 Monocryl in a running fashion.  Steri-Strips placed on the PDS peripheral puncture sites.  Drain secured with sterile dressing.  Midline incision secured with honeycomb sterile dressing.  Patient is being extubated to go to the recovery room.  I discussed operative findings, updated the patient's status, discussed probable steps to recovery, and gave postoperative recommendations to the patient's ex-husband but still close friend, Akeiba Axelson.  Recommendations were made.  Questions were answered.  He expressed understanding & appreciation.  Ardeth Sportsman, M.D., F.A.C.S. Gastrointestinal and Minimally Invasive Surgery [facility] [address] [place], [place] [address] [phone] Main / Paging  12/04/2022 1:27 PM

## 2022-12-04 NOTE — Anesthesia Postprocedure Evaluation (Signed)
Anesthesia Post Note  Patient: FLONNIE WIERMAN  Procedure(s) Performed: OPEN INCISIONAL HERNIA REPAIR WITH MESH AND ABDOMINAL RECONSTRUCTION AND COMPONENT SEPARATION     Patient location during evaluation: PACU Anesthesia Type: General Level of consciousness: awake and alert Pain management: pain level controlled Vital Signs Assessment: post-procedure vital signs reviewed and stable Respiratory status: spontaneous breathing, nonlabored ventilation, respiratory function stable and patient connected to nasal cannula oxygen Cardiovascular status: blood pressure returned to baseline and stable Postop Assessment: no apparent nausea or vomiting Anesthetic complications: no   No notable events documented.  Last Vitals:  Vitals:   12/04/22 0547 12/04/22 1317  BP: (!) 124/97 (!) 87/56  Pulse: 70 (!) 107  Resp: 16 13  Temp: 36.7 C (!) 36.3 C  SpO2: 98% 99%    Last Pain:  Vitals:   12/04/22 1317  TempSrc:   PainSc: 0-No pain                 Maxxwell Edgett

## 2022-12-04 NOTE — Transfer of Care (Signed)
Immediate Anesthesia Transfer of Care Note  Patient: Michele [name]  Procedure(s) Performed: OPEN INCISIONAL HERNIA REPAIR WITH MESH AND ABDOMINAL RECONSTRUCTION AND COMPONENT SEPARATION  Patient Location: PACU  Anesthesia Type:General  Level of Consciousness: awake, alert , and oriented  Airway & Oxygen Therapy: Patient Spontanous Breathing and Patient connected to face mask oxygen  Post-op Assessment: Report given to RN, Post -op Vital signs reviewed and stable, and Patient moving all extremities X 4  Post vital signs: Reviewed and stable  Last Vitals:  Vitals Value Taken Time  BP 81/56 12/04/22 1317  Temp    Pulse 105 12/04/22 1319  Resp 10 12/04/22 1319  SpO2 100 % 12/04/22 1319  Vitals shown include unvalidated device data.  Last Pain:  Vitals:   12/04/22 0547  TempSrc: Oral  PainSc: 0-No pain         Complications: No notable events documented.

## 2022-12-04 NOTE — Interval H&P Note (Signed)
History and Physical Interval Note:  12/04/2022 6:59 AM  Michele [name]  has presented today for surgery, with the diagnosis of VENTRAL INCISIONAL ABDOMINAL WALL HERNIAE.  The various methods of treatment have been discussed with the patient and family. After consideration of risks, benefits and other options for treatment, the patient has consented to  Procedure(s) with comments: OPEN INCISIONAL HERNIA REPAIR WITH ABDOMINAL RECONSTRUCTION AND COMPONENT SEPARATION (N/A) - GEN w/ERAS PATHWAY LOCAL as a surgical intervention.  The patient's history has been reviewed, patient examined, no change in status, stable for surgery.  I have reviewed the patient's chart and labs.  Questions were answered to the patient's satisfaction.    I have re-reviewed the the patient's records, history, medications, and allergies.  I have re-examined the patient.  I again discussed intraoperative plans and goals of post-operative recovery.  The patient agrees to proceed.  Michele [name]  1966-06-27 [id number]  Patient Care Team: Early, Sung Amabile, NP as PCP - General (Nurse Practitioner) Karie Soda, MD as Consulting Physician (Colon and Rectal Surgery) Violeta Gelinas, MD as Consulting Physician (General Surgery) Essie Hart, MD (Inactive) as Referring Physician (Obstetrics and Gynecology) Mansouraty, Netty Starring., MD as Consulting Physician (Gastroenterology)  Patient Active Problem List   Diagnosis Date Noted   Anxiety state 12/04/2022   Obesity (BMI 30-39.9) 12/04/2022   S/P colostomy takedown 09/09/2022   Incisional hernia, without obstruction or gangrene 07/09/2022   Status post Hartmann's procedure 07/09/2022   Attention deficit hyperactivity disorder (ADHD), predominantly inattentive type 05/07/2022   Depression, recurrent 05/07/2022   Rash 05/07/2022   Encounter for annual physical exam 05/07/2022   Panic disorder 01/21/2022   Type 2 diabetes mellitus without complication, without long-term  current use of insulin 07/02/2020   Vitamin D deficiency 06/29/2020   Hyperlipidemia 06/15/2013   Neutropenia 01/29/2010    Past Medical History:  Diagnosis Date   Acute deep vein thrombosis (DVT) of femoral vein 01/21/2022   Allergy    Anemia    Anxiety    Delayed perforation of colon s/p [facility] colectomy/ostomy March 82956 11/05/2021   Delayed perforation of colon s/p [facility] colectomy/ostomy March 2023 11/05/2021   Depression    Hyperlipidemia    Intra-abdominal abscess 12/01/2021   Pre-diabetes    S/P TAH (total abdominal hysterectomy) 10/25/2021    Past Surgical History:  Procedure Laterality Date   CESAREAN SECTION     TWO of them   COLOSTOMY Left 11/05/2021   Procedure: COLOSTOMY;  Surgeon: Essie Hart, MD;  Location: [facility] OR;  Service: Gynecology;  Laterality: Left;   CYSTOSCOPY  10/25/2021   Procedure: CYSTOSCOPY;  Surgeon: Essie Hart, MD;  Location: [facility] OR;  Service: Gynecology;;   dental implants N/A 2016   lower 2 teeth   HYSTERECTOMY ABDOMINAL WITH SALPINGECTOMY  10/25/2021   Procedure: HYSTERECTOMY ABDOMINAL WITH BILATERAL SALPINGECTOMY;  Surgeon: Essie Hart, MD;  Location: [facility] OR;  Service: Gynecology;;   HYSTEROSCOPY WITH D & C  01/30/2017   Procedure: DILATATION AND CURETTAGE /HYSTEROSCOPY;  Surgeon: Essie Hart, MD;  Location: [facility] ORS;  Service: Gynecology;;   IR CATHETER TUBE CHANGE  12/02/2021   IR GUIDED DRAIN W CATHETER PLACEMENT  12/02/2021   IR RADIOLOGIST EVAL & MGMT  12/17/2021   IR SINUS/FIST TUBE CHK-NON GI  12/02/2021   LAPAROSCOPIC LYSIS OF ADHESIONS N/A 07/09/2022   Procedure: ROBOTIC AND LAP LYSIS OF ADHESIONS, BILATERAL TAP BLOCK;  Surgeon: Karie Soda, MD;  Location: [facility] ORS;  Service: General;  Laterality: N/A;  LAPAROSCOPY  10/25/2021   Procedure: LAPAROSCOPY DIAGNOSTIC;  Surgeon: Essie Hart, MD;  Location: [facility] OR;  Service: Gynecology;;   LAPAROTOMY N/A 11/05/2021   Procedure: EXPLORATORY LAPAROTOMY AND COLECTOMY ;  Surgeon: Essie Hart, MD;   Location: [facility] OR;  Service: Gynecology;  Laterality: N/A;   LAPAROTOMY N/A 11/14/2021   Procedure: EXPLORATORY  LAPAROTOMY AND ABDOMINAL WALL CLOSURE;  Surgeon: Harriette Bouillon, MD;  Location: [facility] OR;  Service: General;  Laterality: N/A;   PROCTOSCOPY N/A 07/09/2022   Procedure: RIGID PROCTOSCOPY;  Surgeon: Karie Soda, MD;  Location: [facility] ORS;  Service: General;  Laterality: N/A;   XI ROBOTIC ASSISTED COLOSTOMY TAKEDOWN N/A 07/09/2022   Procedure: ROBOTIC OSTOMY TAKEDOWN;  Surgeon: Karie Soda, MD;  Location: [facility] ORS;  Service: General;  Laterality: N/A;  GEN w/ ERAS PATHWAY LOCAL    Social History   Socioeconomic History   Marital status: Divorced    Spouse name: Not on file   Number of children: Not on file   Years of education: Not on file   Highest education level: Not on file  Occupational History   Not on file  Tobacco Use   Smoking status: Former    Packs/day: 0.50    Years: 15.00    Additional pack years: 0.00    Total pack years: 7.50    Types: Cigarettes    Quit date: 2003    Years since quitting: 21.3   Smokeless tobacco: Never   Tobacco comments:    quit 2003  Vaping Use   Vaping Use: Never used  Substance and Sexual Activity   Alcohol use: No   Drug use: No   Sexual activity: Yes    Birth control/protection: Surgical    Comment: 2006  Other Topics Concern   Not on file  Social History Narrative   The pt is a homemaker -pt quit smoking cigarettes 2004    Social Determinants of Health   Financial Resource Strain: Not on file  Food Insecurity: Not on file  Transportation Needs: Not on file  Physical Activity: Not on file  Stress: Not on file  Social Connections: Not on file  Intimate Partner Violence: Not on file    Family History  Problem Relation Age of Onset   Colon polyps Mother    Heart attack Father    Hypertension Sister    Colon cancer Neg Hx    Esophageal cancer Neg Hx    Rectal cancer Neg Hx    Stomach cancer Neg Hx     Medications  Prior to Admission  Medication Sig Dispense Refill Last Dose   amphetamine-dextroamphetamine (ADDERALL XR) 30 MG 24 hr capsule Take 1 capsule (30 mg total) by mouth daily. (Patient taking differently: Take 15 mg by mouth daily.) 30 capsule 0 12/03/2022   citalopram (CELEXA) 20 MG tablet Take 1 tablet (20 mg total) by mouth daily. 90 tablet 3 12/03/2022   clonazePAM (KLONOPIN) 1 MG tablet Take 0.5-1 tablets (0.5-1 mg total) by mouth 2 (two) times daily as needed (severe panic symptoms). (Patient taking differently: Take 0.5-1 mg by mouth daily as needed for anxiety.) 60 tablet 2 Past Month   fluticasone (FLONASE) 50 MCG/ACT nasal spray Place 1 spray into both nostrils daily.   12/03/2022   melatonin 5 MG TABS Take 5 mg by mouth at bedtime as needed (sleep).   Past Month   Pediatric Multivitamins-Iron (FLINTSTONES PLUS EXTRA IRON PO) Take 1 tablet by mouth daily.   Past Week   polyethylene glycol (  MIRALAX / GLYCOLAX) 17 g packet Take 17 g by mouth 2 (two) times daily as needed. (Patient taking differently: Take 17-34 g by mouth every evening.) 14 each 0 12/03/2022   rosuvastatin (CRESTOR) 5 MG tablet Take 1 tablet (5 mg total) by mouth daily. 90 tablet 3 12/02/2022   VITAMIN D, CHOLECALCIFEROL, PO Take 1 capsule by mouth 3 (three) times a week.   Past Week   ascorbic acid (VITAMIN C) 500 MG tablet Take 1 tablet (500 mg total) by mouth 2 (two) times daily. (Patient not taking: Reported on 11/27/2022) 60 tablet 0 Not Taking   ferrous sulfate 325 (65 FE) MG tablet Take 1 tablet (325 mg total) by mouth daily with breakfast. (Patient not taking: Reported on 11/27/2022) 30 tablet 0 Not Taking   mometasone (ELOCON) 0.1 % cream Apply 1 Application topically as needed for itching. (Patient not taking: Reported on 06/27/2022) 45 g 2 Not Taking   traMADol (ULTRAM) 50 MG tablet Take 1-2 tablets (50-100 mg total) by mouth every 6 (six) hours as needed for moderate pain or severe pain. (Patient not taking: Reported on  11/27/2022) 20 tablet 0 Not Taking    Current Facility-Administered Medications  Medication Dose Route Frequency Provider Last Rate Last Admin   bupivacaine liposome (EXPAREL) 1.3 % injection 266 mg  20 mL Infiltration Once Karie Soda, MD       ceFAZolin (ANCEF) IVPB 2g/100 mL premix  2 g Intravenous On Call to OR Karie Soda, MD       Chlorhexidine Gluconate Cloth 2 % PADS 6 each  6 each Topical Once Karie Soda, MD       And   Chlorhexidine Gluconate Cloth 2 % PADS 6 each  6 each Topical Once Karie Soda, MD       lactated ringers infusion   Intravenous Continuous Achille Rich, MD 10 mL/hr at 12/04/22 [phone] Continued from Pre-op at 12/04/22 8295     Allergies  Allergen Reactions   Atorvastatin Rash and Other (See Comments)    Muscle aches on Lipitor 20 mg and 40 mg     Sulfa Antibiotics Nausea And Vomiting, Other (See Comments) and Rash    BP (!) 124/97   Pulse 70   Temp 98.1 F (36.7 C) (Oral)   Resp 16   Ht 5' (1.524 m)   Wt 76 kg   LMP 10/07/2021 (Approximate)   SpO2 98%   BMI 32.72 kg/m   Labs: No results found for this or any previous visit (from the past 48 hour(s)).  Imaging / Studies: No results found.   Ardeth Sportsman, M.D., F.A.C.S. Gastrointestinal and Minimally Invasive Surgery [facility] [address] [place], [place] [address] [phone] Main / Paging  12/04/2022 6:59 AM    Ardeth Sportsman

## 2022-12-04 NOTE — H&P (Signed)
12/04/2022     PROVIDER: Jarrett Soho, MD  Patient Care Team: Early, Sung Amabile, NP as PCP - General [name], MD as Consulting Provider (General Surgery) Wynonia Hazard, MD (Obstetrics and Gynecology) Michaell Cowing, Shawn Route, MD as Consulting Provider (Colon and Rectal Surgery)  [facility] MRN: [id number] DOB: [birth year] DATE OF ENCOUNTER: 09/09/2022  Interval History:  The patient returns to the office after undergoing robotic lysis of adhesions with side-to-side colorectal anastomosis with colostomy takedown on 07/09/2022  Pathology: Benign  Patient comes in today feeling better overall. Her lower abdominal pain and crampiness is gone down. She has been taking MiraLAX double dose at bedtime. Usually has 1-3 bowel movements in the morning. That is worked well. She tried to cut down to MiraLAX once a day and she got constipated again. She still has significant bowing of her midline abdominal hernia. Often has to wear a binder. It does get uncomfortable. No fevers or chills. No nausea or vomiting. Appetite good. She gained weight since surgery. She again has not smoked in over a decade. No diabetes. No fevers or chills.  Prior note of 07/28/2022. Patient returns for first postop visit. She went home postop day 4. She called last Friday with concerns of constipation. Took some MiraLAX. Occasionally doing Metamucil as well. She is felt some full and bloatedness. Occasional nausea. She is just doing liquids and Ensure. She still has not had a large bowel movement for over a week. She does note that sometimes she can go 10 days without a bowel movement. She does note when she was taking the MiraLAX with her colostomy, she was moving her bowels every day. She denies any fevers or chills. No sharp pain. The midline incisional hernia does bulge a fair amount. She is wearing a binder. That helps a little bit. She is trying to walk most days but has felt a little more tired. No  vomiting.    Labs, Imaging and Diagnostic Testing:  Located in 'Care Everywhere' section of Epic EMR chart  PRIOR [facility] CLINIC NOTES:  Located in 'Care Everywhere' section of Epic EMR chart  SURGERY NOTES:  Located in 'Care Everywhere' section of Epic EMR chart  07/09/2022  12:45 PM  PATIENT: Michele [name] 57 y.o. female  Patient Care Team: Early, Sung Amabile, NP as PCP - General (Nurse Practitioner) Karie Soda, MD as Consulting Physician (Colon and Rectal Surgery) Violeta Gelinas, MD as Consulting Physician (General Surgery) Essie Hart, MD (Inactive) as Referring Physician (Obstetrics and Gynecology)  PRE-OPERATIVE DIAGNOSIS: COLOSTOMY FOR COLON RESECTION. DESIRE FOR OSTOMY TAKEDOWN  POST-OPERATIVE DIAGNOSIS: COLOSTOMY FOR COLON RESECTION. 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 Patient had a very large midline incisional hernia 18 x 8 cm in the setting of diastases & obesity. Retraction of rectus muscle laterally with loss of domain right greater than left. Chronic midline granulation wound 7mm deep in subcutaneous tissue not contiguous with peritoneum. No foreign body suture near the region.  Patient has a isoperistaltic side proximal sigmoid to rectosigmoid colon stump anastomosis. About a 25 cm from anal verge.    PATHOLOGY:  Located in 'Care Everywhere' section of Epic EMR chart  Physical Examination:  There  is no height or weight on file to calculate BMI.  Constitutional: Not cachectic. Hygeine adequate. Walking without guarding. Bright and alert. Not toxic. Not sickly. Eyes: Normal  extraocular movements. Sclera nonicteric Neuro: No major focal sensory defects. No major motor deficits. Psych: No severe agitation. No severe anxiety. Judgment & insight Adequate, Oriented x4, HENT: Normocephalic, Mucus membranes moist. No thrush. Neck: Supple, No tracheal deviation. Chest: Good respiratory excursion. No audible wheezing CV: No major extremity edema Ext: No obvious deformity or contracture. Edema: not present. No cyanosis Skin: Warm and dry Musculoskeletal: Mobility: no assist device moving easily without restrictions  Abdomen: Incisions Clean & dry with normal healing ridge mild abdominal discomfort but no guarding. No peritonitis. Large midline incision hernia subxiphoid to low midline 20x 7cm. Broad and reducible. Left lateral old colostomy incision with normal healing ridge. No purulence redness or drainage.. Soft. Nondistended.  Gen: Inguinal hernia: Not present. Inguinal lymph nodes: without lymphadenopathy.  Rectal: (Deferred)    Assessment and Plan:  Michele [name] is a 56 y.o. female recovering s/p robotic intracorporeal takedown of colostomy in the setting of a giant incisional hernia and obesity..  Diagnoses and all orders for this visit:  Incisional hernia, without obstruction or gangrene  S/P colostomy takedown    She has a very large incisional hernia. I discussed before, I think that she would benefit from open abdominal TAR release component separation with underlay mesh and abdominal reconstruction to help patch the large incisional hernia since she is only 56 and this would only get bigger over time.  The anatomy & physiology of the abdominal wall was discussed. The pathophysiology of hernias was discussed. Natural history risks without surgery including progeressive enlargement, pain, incarceration, & strangulation was discussed. Contributors to complications such as smoking, obesity, diabetes, prior surgery, etc were discussed.  I feel the  risks of no intervention will lead to serious problems that outweigh the operative risks; therefore, I recommended surgery to reduce and repair the hernia. I explained laparoscopic techniques with possible need for an open approach. I noted the probable use of mesh to patch and/or buttress the hernia repair  Risks such as bleeding, infection, abscess, need for further treatment, injury to other organs, need for repair of tissues / organs, stroke, heart attack, death, and other risks were discussed. I noted a good likelihood this will help address the problem. Goals of post-operative recovery were discussed as well. Possibility that this will not correct all symptoms was explained. I stressed the importance of low-impact activity, aggressive pain control, avoiding constipation, & not pushing through pain to minimize risk of post-operative chronic pain or injury. Possibility of reherniation especially with smoking, obesity, diabetes, immunosuppression, and other health conditions was discussed. We will work to minimize complications.   The patient expresses understanding & wishes to proceed with surgery.    Ardeth Sportsman, MD, FACS, MASCRS Esophageal, Gastrointestinal & Colorectal Surgery Robotic and Minimally Invasive Surgery  [facility] [address], [address] [place], [place] [address] [phone] Fax [phone] Main  CONTACT INFORMATION:  Weekday (9AM-5PM): Call [facility] main office at [phone]  Weeknight (5PM-9AM) or Weekend/Holiday: Check [email or web address] (password " [email or web address]") for General Surgery [facility] coverage  (Please, do not use SecureChat as it is not reliable communication to reach operating surgeons for immediate patient care given surgeries/outpatient duties/clinic/cross-coverage/off post-call which would lead to a delay in care.  Epic staff messaging available for outptient concerns, but may not be answered  for 48 hours or  more).    12/04/2022

## 2022-12-04 NOTE — Discharge Instructions (Addendum)
DRAIN CARE:   You have a closed bulb drain to help you heal.    Bulb Closed Drain Care  The bulb drainage system has flexible tubing attached to a soft, plastic egg shaped clear plastic bulb with a stopper. The drainage end of the tubing goes into a cavity your body (surgery area, abscess, etc) through a small opening in your skin. A stitch & tape holds the drainage end in place. The rest of the tube is outside your body, attached to a bulb. When the bulb is squeezed down & compressed with the stopper in place, it creates a constant gentle suction.  This helps draw out fluid that collects in the bulb to help the inner cavity to shrink down & heal. The bulb should be compressed at all times, except when you are emptying the drainage.  How long you will have your drain depends on your recovery & the amount & type of fluid is draining. This is different for everyone. The drain & tubing is usually removed when the drainage is 30 mL or less a day. to your follow-up appointments.  Caring for Your DRAIN In order to care for your Jackson-Pratt at home, you or your caregiver will do the following:  Empty the drain at least once a day and record the color and amount of drainage  Care for the area where the tubing enters your skin by washing with soap and water. Place the bulb in a pocket or safety-pin to your clothes to keep it from pulling at the skin  Milk or flush the tubing to keep the tubing from getting plugged up / blocked.   Do this before you empty and measure your drainage. Look in the mirror at the tubing. This will help you see where your hands need to be. Pinch the tubing close to where it goes into your skin between your thumb and forefinger. With the thumb and forefinger of your other hand, pinch the tubing right below your other fingers. Keep your fingers pinched and slide them down the tubing, pushing any clots down toward the bulb. You may want to use alcohol swabs to help you slide  your fingers down the tubing. Repeat steps 3 and 4 as necessary to push clots from the tubing into the bulb. If you are not able to move a clot into the bulb, call your doctor's office. The fluid may leak around the insertion site if a clot is blocking the drainage flow. If there is fluid in the bulb and no leakage at the insertion site, the drain is working.   Caring for the Insertion Site  Once you have emptied the drainage, clean your hands again.  Check the area around the insertion site:  Redness, swelling, crusting, or yellow scab can form around a drain over time. Usually this is smaller than a dime & stays small  If you have worsening tenderness, swelling, or pus or a fever >101.20F; you may have an infection. Call your doctor's office.  Wash drain site with soap & water (dilute hydrogen peroxide PRN) daily & replace clean dressing / tape    DAILY DRAIN CARE Keep the bulb compressed at all times, except while emptying it. The compression creates suction to gently drain & closed the cavity .  Keep sites where the tubes enter the skin dry and covered with a light bandage (dressing).  Tape the tubes to your skin, 1 to 2 inches away from the insertion site, to keep from pulling  on your stitches. Tubes are stitched in place and will not slip out.  Pin the bulb to your shirt (not to your pants) with a safety pin.  For the first few days after surgery, there usually is more fluid in the bulb. Empty the bulb whenever it becomes half full because the bulb does not create enough suction if it is too full. Include this amount in your 24 hour totals.  When the amount of drainage decreases, empty the bulb at the same time every day. Write down the amounts and the 24 hour totals. Your caregiver will want to know them. This helps your caregiver know when the tubes can be removed.  (We anticipate removing the drain in 1-3 weeks, depending on when the output is <34mL a day for 2+ days) If there is  drainage around the tube sites, change dressings and keep the area dry. If you see a clot in the tube, leave it alone. However, if the tube does not appear to be draining, let your caregiver know.   TO EMPTY THE BULB Wash your hands before & after Open the stopper to release suction.  Holding the stopper out of the way, pour drainage into the measuring cup that was sent home with you.  Measure and write down the amount. If there are 2 bulbs, note the amount of drainage from bulb 1 or bulb 2 and keep the totals separate.  Compress the bulb by folding it in half. & replace the stopper.  Check the tape that holds the tube to your skin, and pin the bulb to your shirt.    SEEK MEDICAL CARE IF: The drainage develops a bad odor.  You have an oral temperature above 101.5 F (38.5 C).  The amount of drainage from your wound suddenly increases or decreases.  You accidentally pull out your drain.  You have any other questions or concerns.      Call our office if you have any questions about your drain. [phone]    HERNIA REPAIR: POST OP INSTRUCTIONS  ######################################################################  EAT Gradually transition to a high fiber diet with a fiber supplement over the next few weeks after discharge.  Start with a pureed / full liquid diet (see below)  WALK Walk an hour a day.  Control your pain to do that.    CONTROL PAIN Control pain so that you can walk, sleep, tolerate sneezing/coughing, and go up/down stairs.  HAVE A BOWEL MOVEMENT DAILY Keep your bowels regular to avoid problems.  OK to try a laxative to override constipation.  OK to use an antidairrheal to slow down diarrhea.  Call if not better after 2 tries  CALL IF YOU HAVE PROBLEMS/CONCERNS Call if you are still struggling despite following these instructions. Call if you have concerns not answered by these  instructions  ######################################################################    DIET: Follow a light bland diet & liquids the first 24 hours after arrival home, such as soup, liquids, starches, etc.  Be sure to drink plenty of fluids.  Quickly advance to a usual solid diet within a few days.  Avoid fast food or heavy meals as your are more likely to get nauseated or have irregular bowels.  A low-fat, high-fiber diet for the rest of your life is ideal.   Take your usually prescribed home medications unless otherwise directed.  PAIN CONTROL: Pain is best controlled by a usual combination of three different methods TOGETHER: Ice/Heat Over the counter pain medication Prescription pain medication Most patients  will experience some swelling and bruising around the hernia(s) such as the bellybutton, groins, or old incisions.  Ice packs or heating pads (30-60 minutes up to 6 times a day) will help. Use ice for the first few days to help decrease swelling and bruising, then switch to heat to help relax tight/sore spots and speed recovery.  Some people prefer to use ice alone, heat alone, alternating between ice & heat.  Experiment to what works for you.  Swelling and bruising can take several weeks to resolve.   It is helpful to take an over-the-counter pain medication regularly for the first few weeks.  Choose one of the following that works best for you: Naproxen (Aleve, etc)  Two 220mg  tabs twice a day Ibuprofen (Advil, etc) Three 200mg  tabs four times a day (every meal & bedtime) Acetaminophen (Tylenol, etc) 325-650mg  four times a day (every meal & bedtime) A  prescription for pain medication should be given to you upon discharge (OXYCODONE & METHOCARBANOL).  Take your pain medication as prescribed.  If you are having problems/concerns with the prescription medicine (does not control pain, nausea, vomiting, rash, itching, etc), please call us [phone] to see if we need to switch you to  a different pain medicine that will work better for you and/or control your side effect better. If you need a refill on your pain medication, please contact your pharmacy.  They will contact our office to request authorization. Prescriptions will not be filled after 5 pm or on week-ends. Wear the abdominal binder to help with soreness  AVOID GETTING CONSTIPATED.   Between the surgery and the pain medications, it is common to experience some constipation.  Drink plenty of liquids Take a fiber supplement 2 times day (such as Metamucil, Citrucel, FiberCon, MiraLax, etc) to have a bowel movement every day. If you have not had a BM by 2 days after surgery: -drink liquids only until you have a bowel movement -take MiraLAX 2 doses every 2 hours until you have a bowel movement   Wash / shower every day.  You may shower over the dressings as they are waterproof.    It is good for closed incisions and even open wounds to be washed every day.  Shower every day.  Short baths are fine.  Wash the incisions and wounds clean with soap & water.    You may leave closed incisions open to air if it is dry.   You may cover the incision with clean gauze & replace it after your daily shower for comfort.  DRAIN:  You have a drain in place.  Every day change the dressing in the shower, wash around the skin exit site with soap & water and place a new dressing of gauze or band aid around the skin every day.  Keep the drain site clean & dry.  Follow up in our surgery office to discuss plans for drain removal in the near future    ACTIVITIES as tolerated:   You may resume regular (light) daily activities beginning the next day--such as daily self-care, walking, climbing stairs--gradually increasing activities as tolerated.  Control your pain so that you can walk an hour a day.  If you can walk 30 minutes without difficulty, it is safe to try more intense activity such as jogging, treadmill, bicycling, low-impact aerobics,  swimming, etc. Save the most intensive and strenuous activity for last such as sit-ups, heavy lifting, contact sports, etc  Refrain from any heavy lifting or straining  until you are off narcotics for pain control.   DO NOT PUSH THROUGH PAIN.  Let pain be your guide: If it hurts to do something, don't do it.  Pain is your body warning you to avoid that activity for another week until the pain goes down. You may drive when you are no longer taking prescription pain medication, you can comfortably wear a seatbelt, and you can safely maneuver your car and apply brakes. You may have sexual intercourse when it is comfortable.   FOLLOW UP in our office Please call [facility] at [phone] to set up an appointment to see your surgeon in the office for a follow-up appointment approximately 2-3 weeks after your surgery. Make sure that you call for this appointment the day you arrive home to insure a convenient appointment time.  9.  If you have disability of FMLA / Family leave forms, please bring the forms to the office for processing.  (do not give to your surgeon).  WHEN TO CALL us [phone]: Poor pain control Reactions / problems with new medications (rash/itching, nausea, etc)  Fever over 101.5 F (38.5 C) Inability to urinate Nausea and/or vomiting Worsening swelling or bruising Continued bleeding from incision. Increased pain, redness, or drainage from the incision   The clinic staff is available to answer your questions during regular business hours (8:30am-5pm).  Please don't hesitate to call and ask to speak to one of our nurses for clinical concerns.   If you have a medical emergency, go to the nearest emergency room or call 911.  A surgeon from [facility] is always on call at the hospitals in [facility] [place] [address], [place], [place]  [address] ?  [address] [address], [place], [place]   [address] MAIN: [phone] ? TOLL FREE:  [phone] ? FAX: [phone] [email or web address]

## 2022-12-05 ENCOUNTER — Encounter (HOSPITAL_COMMUNITY): Payer: Self-pay | Admitting: Surgery

## 2022-12-05 DIAGNOSIS — F339 Major depressive disorder, recurrent, unspecified: Secondary | ICD-10-CM | POA: Diagnosis present

## 2022-12-05 DIAGNOSIS — E119 Type 2 diabetes mellitus without complications: Secondary | ICD-10-CM | POA: Diagnosis present

## 2022-12-05 DIAGNOSIS — E785 Hyperlipidemia, unspecified: Secondary | ICD-10-CM | POA: Diagnosis present

## 2022-12-05 DIAGNOSIS — K660 Peritoneal adhesions (postprocedural) (postinfection): Secondary | ICD-10-CM | POA: Diagnosis present

## 2022-12-05 DIAGNOSIS — Z79899 Other long term (current) drug therapy: Secondary | ICD-10-CM | POA: Diagnosis not present

## 2022-12-05 DIAGNOSIS — K430 Incisional hernia with obstruction, without gangrene: Secondary | ICD-10-CM | POA: Diagnosis present

## 2022-12-05 DIAGNOSIS — Z79890 Hormone replacement therapy (postmenopausal): Secondary | ICD-10-CM | POA: Diagnosis not present

## 2022-12-05 DIAGNOSIS — Z882 Allergy status to sulfonamides status: Secondary | ICD-10-CM | POA: Diagnosis not present

## 2022-12-05 DIAGNOSIS — F410 Panic disorder [episodic paroxysmal anxiety] without agoraphobia: Secondary | ICD-10-CM | POA: Diagnosis present

## 2022-12-05 DIAGNOSIS — F900 Attention-deficit hyperactivity disorder, predominantly inattentive type: Secondary | ICD-10-CM | POA: Diagnosis present

## 2022-12-05 DIAGNOSIS — Z87891 Personal history of nicotine dependence: Secondary | ICD-10-CM | POA: Diagnosis not present

## 2022-12-05 DIAGNOSIS — Z83719 Family history of colon polyps, unspecified: Secondary | ICD-10-CM | POA: Diagnosis not present

## 2022-12-05 DIAGNOSIS — E669 Obesity, unspecified: Secondary | ICD-10-CM | POA: Diagnosis present

## 2022-12-05 DIAGNOSIS — Z90710 Acquired absence of both cervix and uterus: Secondary | ICD-10-CM | POA: Diagnosis not present

## 2022-12-05 DIAGNOSIS — Z86718 Personal history of other venous thrombosis and embolism: Secondary | ICD-10-CM | POA: Diagnosis not present

## 2022-12-05 DIAGNOSIS — Z888 Allergy status to other drugs, medicaments and biological substances status: Secondary | ICD-10-CM | POA: Diagnosis not present

## 2022-12-05 DIAGNOSIS — Z9079 Acquired absence of other genital organ(s): Secondary | ICD-10-CM | POA: Diagnosis not present

## 2022-12-05 DIAGNOSIS — Z6832 Body mass index (BMI) 32.0-32.9, adult: Secondary | ICD-10-CM | POA: Diagnosis not present

## 2022-12-05 DIAGNOSIS — F411 Generalized anxiety disorder: Secondary | ICD-10-CM | POA: Diagnosis present

## 2022-12-05 DIAGNOSIS — Z8249 Family history of ischemic heart disease and other diseases of the circulatory system: Secondary | ICD-10-CM | POA: Diagnosis not present

## 2022-12-05 LAB — BASIC METABOLIC PANEL
Anion gap: 8 (ref 5–15)
BUN: 14 mg/dL (ref 6–20)
CO2: 25 mmol/L (ref 22–32)
Calcium: 7.9 mg/dL — ABNORMAL LOW (ref 8.9–10.3)
Chloride: 100 mmol/L (ref 98–111)
Creatinine, Ser: 0.86 mg/dL (ref 0.44–1.00)
GFR, Estimated: >60 mL/min (ref >60)
Glucose, Bld: 170 mg/dL — ABNORMAL HIGH (ref 70–99)
Potassium: 4.3 mmol/L (ref 3.5–5.1)
Sodium: 133 mmol/L — ABNORMAL LOW (ref 135–145)

## 2022-12-05 LAB — CBC
HCT: 28.9 % — ABNORMAL LOW (ref 36.0–46.0)
Hemoglobin: 9.1 g/dL — ABNORMAL LOW (ref 12.0–15.0)
MCH: 26.2 pg (ref 26.0–34.0)
MCHC: 31.5 g/dL (ref 30.0–36.0)
MCV: 83.3 fL (ref 80.0–100.0)
Platelets: 244 10*3/uL (ref 150–400)
RBC: 3.47 MIL/uL — ABNORMAL LOW (ref 3.87–5.11)
RDW: 14.8 % (ref 11.5–15.5)
WBC: 10 10*3/uL (ref 4.0–10.5)
nRBC: 0 % (ref 0.0–0.2)

## 2022-12-05 LAB — MAGNESIUM: Magnesium: 2 mg/dL (ref 1.7–2.4)

## 2022-12-05 MED ORDER — METHOCARBAMOL 500 MG PO TABS: 500.0000 mg | ORAL_TABLET | Freq: Three times a day (TID) | ORAL | 1 refills | Status: DC | PRN

## 2022-12-05 MED ORDER — OXYCODONE HCL 5 MG PO TABS: 5.0000 mg | ORAL_TABLET | Freq: Four times a day (QID) | ORAL | 0 refills | Status: DC | PRN

## 2022-12-05 MED ORDER — BISACODYL 10 MG RE SUPP
10.0000 mg | Freq: Every day | RECTAL | Status: DC
  Filled 2022-12-05: qty 1

## 2022-12-05 MED ORDER — POLYETHYLENE GLYCOL 3350 17 G PO PACK
17.0000 g | PACK | Freq: Two times a day (BID) | ORAL | Status: DC
  Administered 2022-12-05 – 2022-12-07 (×5): 17 g via ORAL
  Filled 2022-12-05 (×8): qty 1

## 2022-12-05 NOTE — Evaluation (Signed)
Physical Therapy Evaluation Patient Details Name: TARRYN BOGDAN MRN: [id number] DOB: 14-May-1966 Today's Date: 12/05/2022  History of Present Illness  57 yo Female s/p repair of  ventral incisional Abdominal wall Herniae with mesh and present with 2 drains. 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  Clinical Impression  Pt a little familiar with abdominal precautions from previous surgeries last year. Reviewed log roll with hooklying in bed to roll to side prior to sitting up and reverse for sitting to side lying to supine to decrease intraabdominal pressure. Also discussed bracing with pillow or folded towel for coughing or laughing, as well as posture to prevent forward flexion.    During ambulation in room, pt wen to the bathroom and then was feeling a little weak , she feels it could be the medicines and first time up.  Encouraged pt to try to walk with nursing the afternoon at least 2 more times and each time should be better. I don't anticipate pt needing RW for DC . Should progress nicely. Will have nursing and mobility team continue to mobilize pt. PT to DC .      Recommendations for follow up therapy are one component of a multi-disciplinary discharge planning process, led by the attending physician.  Recommendations may be updated based on patient status, additional functional criteria and insurance authorization.  Follow Up Recommendations       Assistance Recommended at Discharge Intermittent Supervision/Assistance  Patient can return home with the following       Equipment Recommendations None recommended by PT  Recommendations for Other Services       Functional Status Assessment Patient has had a recent decline in their functional status and demonstrates the ability to make significant improvements in function in a reasonable and predictable amount of time.     Precautions / Restrictions  Precautions Precautions: Other (comment) Precaution Comments: abdominal incision and 2 drains Restrictions Weight Bearing Restrictions: No      Mobility  Bed Mobility Overal bed mobility: Needs Assistance Bed Mobility: Supine to Sit, Sit to Supine     Supine to sit: Min guard     General bed mobility comments: educated with hooklying and log roll for abdomen protection    Transfers Overall transfer level: Needs assistance Equipment used: Standard walker Transfers: Sit to/from Stand Sit to Stand: Min guard           General transfer comment: just becaseu it was first time up and pt felt a little weak from medicines    Ambulation/Gait Ambulation/Gait assistance: Min guard Gait Distance (Feet): 10 Feet Assistive device: Rolling walker (2 wheels) Gait Pattern/deviations: Step-through pattern       General Gait Details: slow in room due to feeling a little dizzy and nauseous with first time up, so limited walking at this time.  Stairs            Wheelchair Mobility    Modified Rankin (Stroke Patients Only)       Balance Overall balance assessment: Modified Independent                                           Pertinent Vitals/Pain Pain Assessment Pain Assessment: 0-10 Pain Score: 2  Pain Location: abdomen Pain Descriptors / Indicators: Burning Pain Intervention(s): Monitored during session    Home Living Family/patient expects to be  discharged to:: Private residence Living Arrangements: Children (mother comig to stay with her) Available Help at Discharge: Family Type of Home: House Home Access: Level entry       Home Layout: One level Home Equipment: Shower seat Additional Comments: adjustable bed and Human resources officer    Prior Function Prior Level of Function : Independent/Modified Independent                     Hand Dominance        Extremity/Trunk Assessment        Lower Extremity Assessment Lower  Extremity Assessment: Overall WFL for tasks assessed       Communication   Communication: No difficulties  Cognition Arousal/Alertness: Awake/alert Behavior During Therapy: WFL for tasks assessed/performed Overall Cognitive Status: Within Functional Limits for tasks assessed                                          General Comments      Exercises     Assessment/Plan    PT Assessment Patient does not need any further PT services (ambulation can be addressed by nusring staff now that all education complete)  PT Problem List Decreased activity tolerance       PT Treatment Interventions      PT Goals (Current goals can be found in the Care Plan section)  Acute Rehab PT Goals PT Goal Formulation: All assessment and education complete, DC therapy    Frequency       Co-evaluation               AM-PAC PT "6 Clicks" Mobility  Outcome Measure Help needed turning from your back to your side while in a flat bed without using bedrails?: A Little Help needed moving from lying on your back to sitting on the side of a flat bed without using bedrails?: A Little Help needed moving to and from a bed to a chair (including a wheelchair)?: A Little Help needed standing up from a chair using your arms (e.g., wheelchair or bedside chair)?: A Little Help needed to walk in hospital room?: A Little Help needed climbing 3-5 steps with a railing? : A Little 6 Click Score: 18    End of Session Equipment Utilized During Treatment:  (abdominal binder on pt) Activity Tolerance: Patient tolerated treatment well Patient left: in chair;with call bell/phone within reach (pt agreed to not get up by herself and ring bell) Nurse Communication: Mobility status PT Visit Diagnosis: Unsteadiness on feet (R26.81)    Time: 1330-1350 PT Time Calculation (min) (ACUTE ONLY): 20 min   Charges:   PT Evaluation $PT Eval Low Complexity: 1 Low          Sharni Negron, PT, MPT Acute  Rehabilitation Services Office: [phone] If a weekend: [facility] w/e pager [phone] 12/05/2022   Marella Bile 12/05/2022, 2:20 PM

## 2022-12-05 NOTE — TOC CM/SW Note (Signed)
  Transition of Care [facility]) Screening Note   Patient Details  Name: Michele [name] Date of Birth: [birth year]   Transition of Care [facility]) CM/SW Contact:    Amada Jupiter, LCSW Phone Number: 12/05/2022, 9:58 AM    Transition of Care Department [facility]) has reviewed patient and no TOC needs have been identified at this time. We will continue to monitor patient advancement through interdisciplinary progression rounds. If new patient transition needs arise, please place a TOC consult.

## 2022-12-05 NOTE — Progress Notes (Addendum)
Michele [name] [id number] [birth year]  CARE TEAM:  PCP: Tollie Eth, NP  Outpatient Care Team: Patient Care Team: Early, Sung Amabile, NP as PCP - General (Nurse Practitioner) Karie Soda, MD as Consulting Physician (Colon and Rectal Surgery) Violeta Gelinas, MD as Consulting Physician (General Surgery) Essie Hart, MD (Inactive) as Referring Physician (Obstetrics and Gynecology) Mansouraty, Netty Starring., MD as Consulting Physician (Gastroenterology)  Inpatient Treatment Team: Treatment Team: Attending Provider: Karie Soda, MD; Charge Nurse: Diona Browner, RN; Pharmacist: Lucia Gaskins, [facility]; Utilization Review: Helyn Numbers, RN; Social Worker: Anselm Pancoast, [place]   Problem List:   Principal Problem:   Incisional hernia, without obstruction or gangrene Active Problems:   Type 2 diabetes mellitus without complication, without long-term current use of insulin (HCC)   Panic disorder   Attention deficit hyperactivity disorder (ADHD), predominantly inattentive type   Depression, recurrent (HCC)   Status post Hartmann's procedure (HCC)   Obesity (BMI 30-39.9)   Incarcerated incisional hernia   1 Day Post-Op  12/04/2022  POST-OPERATIVE DIAGNOSIS:  VENTRAL INCISIONAL ABDOMINAL WALL HERNIAE  Dimensions of hernia post-op:  21cm x 12cm   PROCEDURE:   LYSIS OF ADHESIONS x (1/3 case) COMPONENT SEPARATION (TRANSVERSUS ABDOMINIS RELEASE - BILATERAL) ABDOMINAL WALL RECONSTRUCTION WITH 35X30CM MESH TRANSVERSUS ABDOMINIS PLANE (TAP) BLOCK - BILATERAL   SURGEON:  Ardeth Sportsman, MD  OR FINDINGS:    Patient had a 21 x 12 cm incisional hernia incarcerated with very dense adhesions of small bowel colon and greater omentum.     Type of repair - Component separation repair - Transversus abdominis release (TAR) - bilateral  With mesh reinforcement              Name of mesh - Ventralight Bard polypropylene   Size of mesh - Height 35 cm, Width 30 cm   Orientation:   Diamond    Mesh overlap - 5-10 cm   Placement of mesh - retrorectus underlay   Assessment  Recovering with expected postoperative pain  [facility] Stay = 0 days)  Plan:  -Advance to solid diet -Stop IV fluids -Multimodal pain control.  Continue binder ice/heat/Tylenol/gabapentin scheduled.  Methocarbamol and oral and IV narcotics for backup. -VTE prophylaxis- SCDs, etc -ADHD he with some depression/anxiety.  Resuming Adderall and Klonopin as needed. Seasonal allergies.  Continuing antihistamine blocker. -mobilize as tolerated to help recovery.  Patient worried about getting up.  Will have physical therapy evaluate just to make sure she does not need a walker or other help given her soreness.  Disposition:  Disposition:  The patient is from: Home  Anticipate discharge to:  Home  Anticipated Date of Discharge is:  April 27,2024    Barriers to discharge:  Therapy assessment & Recommendations pending and Pending Clinical improvement (more likely than not)  Patient currently is NOT MEDICALLY STABLE for discharge from the hospital from a surgery standpoint.      I reviewed nursing notes, last 24 h vitals and pain scores, last 48 h intake and output, last 24 h labs and trends, and last 24 h imaging results. I have reviewed this patient's available data, including medical history, events of note, test results, etc as part of my evaluation.  A significant portion of that time was spent in counseling.  Care during the described time interval was provided by me.  This care required moderate level of medical decision making.  12/05/2022    Subjective: (Chief complaint)  Patient denies much pain but has been  afraid to get out of bed.  Tolerating liquids and any nausea.    Objective:  Vital signs:  Vitals:   12/04/22 1853 12/04/22 2035 12/05/22 0015 12/05/22 0415  BP: 100/70 105/73 114/78 103/67  Pulse: 81 78 91 91  Resp:  18 18 18   Temp: 97.9 F (36.6 C) 98 F (36.7  C) 98.2 F (36.8 C) 98.3 F (36.8 C)  TempSrc: Oral     SpO2: 100% 100% 99% 99%  Weight:      Height:           Intake/Output   Yesterday:  04/25 0701 - 04/26 0700 In: 4933 [P.O.:1080; I.V.:3403; IV Piggyback:450] Out: 2120 [Urine:1585; Drains:385; Blood:150] This shift:  No intake/output data recorded.  Bowel function:  Flatus: No  BM:  No  Drain: Serosanguinous   Physical Exam:  General: Pt awake/alert in no acute distress..  Not toxic. Eyes: PERRL, normal EOM.  Sclera clear.  No icterus Neuro: CN II-XII intact w/o focal sensory/motor deficits. Lymph: No head/neck/groin lymphadenopathy Psych:  No delerium/psychosis/paranoia.  Oriented x 4 HENT: Normocephalic, Mucus membranes moist.  No thrush Neck: Supple, No tracheal deviation.  No obvious thyromegaly Chest: No pain to chest wall compression.  Good respiratory excursion.  No audible wheezing CV:  Pulses intact.  Regular rhythm.  No major extremity edema MS: Normal AROM mjr joints.  No obvious deformity  Abdomen: Soft.  Mildy distended.  Mildly tender at incisions only.  No evidence of peritonitis.  No incarcerated hernias.  Ext:  No deformity.  No mjr edema.  No cyanosis Skin: No petechiae / purpurea.  No major sores.  Warm and dry    Results:   Cultures: No results found for this or any previous visit (from the past 720 hour(s)).  Labs: Results for orders placed or performed during the hospital encounter of 12/04/22 (from the past 48 hour(s))  Glucose, capillary     Status: Abnormal   Collection Time: 12/04/22  1:48 PM  Result Value Ref Range   Glucose-Capillary 173 (H) 70 - 99 mg/dL    Comment: Glucose reference range applies only to samples taken after fasting for at least 8 hours.  CBC     Status: Abnormal   Collection Time: 12/05/22  4:36 AM  Result Value Ref Range   WBC 10.0 4.0 - 10.5 K/uL   RBC 3.47 (L) 3.87 - 5.11 MIL/uL   Hemoglobin 9.1 (L) 12.0 - 15.0 g/dL   HCT 16.1 (L) 09.6 - 04.5 %    MCV 83.3 80.0 - 100.0 fL   MCH 26.2 26.0 - 34.0 pg   MCHC 31.5 30.0 - 36.0 g/dL   RDW 40.9 81.1 - 91.4 %   Platelets 244 150 - 400 K/uL   nRBC 0.0 0.0 - 0.2 %    Comment: Performed at [facility], [address] [place], [place] [address]  Basic metabolic panel     Status: Abnormal   Collection Time: 12/05/22  4:36 AM  Result Value Ref Range   Sodium 133 (L) 135 - 145 mmol/L   Potassium 4.3 3.5 - 5.1 mmol/L   Chloride 100 98 - 111 mmol/L   CO2 25 22 - 32 mmol/L   Glucose, Bld 170 (H) 70 - 99 mg/dL    Comment: Glucose reference range applies only to samples taken after fasting for at least 8 hours.   BUN 14 6 - 20 mg/dL   Creatinine, Ser 6.21 0.44 - 1.00 mg/dL   Calcium  7.9 (L) 8.9 - 10.3 mg/dL   GFR, Estimated >16 >10 mL/min    Comment: (NOTE) Calculated using the CKD-EPI Creatinine Equation (2021)    Anion gap 8 5 - 15    Comment: Performed at [facility], [address] [place], [place] [address]  Magnesium     Status: None   Collection Time: 12/05/22  4:36 AM  Result Value Ref Range   Magnesium 2.0 1.7 - 2.4 mg/dL    Comment: Performed at [facility], [address] [place], [place] [address]    Imaging / Studies: No results found.  Medications / Allergies: per chart  Antibiotics: Anti-infectives (From admission, onward)    Start     Dose/Rate Route Frequency Ordered Stop   12/04/22 1900  ceFAZolin (ANCEF) IVPB 2g/100 mL premix        2 g 200 mL/hr over 30 Minutes Intravenous Every 8 hours 12/04/22 1507 12/04/22 1854   12/04/22 0600  ceFAZolin (ANCEF) IVPB 2g/100 mL premix        2 g 200 mL/hr over 30 Minutes Intravenous On call to O.R. 12/04/22 0534 12/04/22 1145         Note: Portions of this report may have been transcribed using voice recognition software. Every effort was made to ensure accuracy; however, inadvertent computerized transcription errors may be present.   Any transcriptional  errors that result from this process are unintentional.    Ardeth Sportsman, MD, FACS, MASCRS Esophageal, Gastrointestinal & Colorectal Surgery Robotic and Minimally Invasive Surgery  [facility] [address], [address] [place], [place] [address] [phone] Fax [phone] Main  CONTACT INFORMATION:  Weekday (9AM-5PM): Call [facility] main office at [phone]  Weeknight (5PM-9AM) or Weekend/Holiday: Check [email or web address] (password " [email or web address]") for General Surgery [facility] coverage  (Please, do not use SecureChat as it is not reliable communication to reach operating surgeons for immediate patient care given surgeries/outpatient duties/clinic/cross-coverage/off post-call which would lead to a delay in care.  Epic staff messaging available for outptient concerns, but may not be answered for 48 hours or more).     12/05/2022  9:12 AM

## 2022-12-06 MED ORDER — AMPHETAMINE-DEXTROAMPHET ER 10 MG PO CP24
10.0000 mg | ORAL_CAPSULE | Freq: Once | ORAL | Status: AC
  Administered 2022-12-06: 10 mg via ORAL
  Filled 2022-12-06: qty 1

## 2022-12-06 NOTE — Progress Notes (Signed)
2 Days Post-Op   Subjective/Chief Complaint: Dizzy when standing yesterday so didn't walk, flatus this am for first time, pain fair control, tol breakfast no n/v, urinating fine   Objective: Vital signs in last 24 hours: Temp:  [97.9 F (36.6 C)-98.7 F (37.1 C)] 98.7 F (37.1 C) (04/27 0515) Pulse Rate:  [95-106] 95 (04/27 0516) Resp:  [15-20] 15 (04/27 0515) BP: (110-133)/(61-68) 113/61 (04/27 0515) SpO2:  [89 %-98 %] 92 % (04/27 0516) Last BM Date : 12/03/22  Intake/Output from previous day: 04/26 0701 - 04/27 0700 In: 1600 [P.O.:1400; I.V.:200] Out: 795 [Urine:600; Drains:195] Intake/Output this shift: No intake/output data recorded.  General nad Cv regular Pulm effort normal Ab incision clean, drains serosang, approp tender  Lab Results:  Recent Labs    12/05/22 0436  WBC 10.0  HGB 9.1*  HCT 28.9*  PLT 244   BMET Recent Labs    12/05/22 0436  NA 133*  K 4.3  CL 100  CO2 25  GLUCOSE 170*  BUN 14  CREATININE 0.86  CALCIUM 7.9*   PT/INR No results for input(s): "LABPROT", "INR" in the last 72 hours. ABG No results for input(s): "PHART", "HCO3" in the last 72 hours.  Invalid input(s): "PCO2", "PO2"  Studies/Results: No results found.  Anti-infectives: Anti-infectives (From admission, onward)    Start     Dose/Rate Route Frequency Ordered Stop   12/04/22 1900  ceFAZolin (ANCEF) IVPB 2g/100 mL premix        2 g 200 mL/hr over 30 Minutes Intravenous Every 8 hours 12/04/22 1507 12/04/22 1854   12/04/22 0600  ceFAZolin (ANCEF) IVPB 2g/100 mL premix        2 g 200 mL/hr over 30 Minutes Intravenous On call to O.R. 12/04/22 0534 12/04/22 1145       Assessment/Plan: POD 2 TAR -soft diet -miralax for bowel function- she does not want dulcolax -recheck cbc in am -not ready for dc -will stop neurontin due to dizziness and follow    Emelia Loron 12/06/2022

## 2022-12-07 LAB — CBC
HCT: 22.5 % — ABNORMAL LOW (ref 36.0–46.0)
HCT: 24 % — ABNORMAL LOW (ref 36.0–46.0)
Hemoglobin: 7 g/dL — ABNORMAL LOW (ref 12.0–15.0)
Hemoglobin: 7.5 g/dL — ABNORMAL LOW (ref 12.0–15.0)
MCH: 26.1 pg (ref 26.0–34.0)
MCH: 26.3 pg (ref 26.0–34.0)
MCHC: 31.1 g/dL (ref 30.0–36.0)
MCHC: 31.3 g/dL (ref 30.0–36.0)
MCV: 84 fL (ref 80.0–100.0)
MCV: 84.2 fL (ref 80.0–100.0)
Platelets: 259 10*3/uL (ref 150–400)
Platelets: 307 10*3/uL (ref 150–400)
RBC: 2.68 MIL/uL — ABNORMAL LOW (ref 3.87–5.11)
RBC: 2.85 MIL/uL — ABNORMAL LOW (ref 3.87–5.11)
RDW: 15.1 % (ref 11.5–15.5)
RDW: 15.2 % (ref 11.5–15.5)
WBC: 7.8 10*3/uL (ref 4.0–10.5)
WBC: 8 10*3/uL (ref 4.0–10.5)
nRBC: 0 % (ref 0.0–0.2)
nRBC: 0 % (ref 0.0–0.2)

## 2022-12-07 MED ORDER — SODIUM CHLORIDE 0.9 % IV SOLN: INTRAVENOUS | Status: DC

## 2022-12-07 NOTE — Progress Notes (Signed)
3 Days Post-Op   Subjective/Chief Complaint: Feels better, no n/v, having flatus, still very sore   Objective: Vital signs in last 24 hours: Temp:  [98.2 F (36.8 C)-99.5 F (37.5 C)] 99.5 F (37.5 C) (04/28 0448) Pulse Rate:  [96-109] 105 (04/28 0448) Resp:  [16-20] 20 (04/28 0448) BP: (105-113)/(64-77) 107/64 (04/28 0448) SpO2:  [91 %-95 %] 91 % (04/28 0448) Last BM Date : 12/04/22  Intake/Output from previous day: 04/27 0701 - 04/28 0700 In: 960 [P.O.:960] Out: 435 [Urine:300; Drains:135] Intake/Output this shift: No intake/output data recorded.   General nad Cv regular Pulm effort normal Ab incision clean, drains serosang, approp tender   Lab Results:  Recent Labs    12/05/22 0436 12/07/22 0443  WBC 10.0 7.8  HGB 9.1* 7.0*  HCT 28.9* 22.5*  PLT 244 259   BMET Recent Labs    12/05/22 0436  NA 133*  K 4.3  CL 100  CO2 25  GLUCOSE 170*  BUN 14  CREATININE 0.86  CALCIUM 7.9*   PT/INR No results for input(s): "LABPROT", "INR" in the last 72 hours. ABG No results for input(s): "PHART", "HCO3" in the last 72 hours.  Invalid input(s): "PCO2", "PO2"  Studies/Results: No results found.  Anti-infectives: Anti-infectives (From admission, onward)    Start     Dose/Rate Route Frequency Ordered Stop   12/04/22 1900  ceFAZolin (ANCEF) IVPB 2g/100 mL premix        2 g 200 mL/hr over 30 Minutes Intravenous Every 8 hours 12/04/22 1507 12/04/22 1854   12/04/22 0600  ceFAZolin (ANCEF) IVPB 2g/100 mL premix        2 g 200 mL/hr over 30 Minutes Intravenous On call to O.R. 12/04/22 0534 12/04/22 1145       Assessment/Plan: POD 3 TAR -soft diet -miralax for bowel function- she does not want dulcolax -recheck cbc later today and in am, not sure if oozing vs equilibrating, drains are as expected -not ready for dc at all -hold pharm dvt prophylaxis    Emelia Loron 12/07/2022

## 2022-12-08 LAB — CBC
HCT: 21.4 % — ABNORMAL LOW (ref 36.0–46.0)
Hemoglobin: 6.7 g/dL — CL (ref 12.0–15.0)
MCH: 26.2 pg (ref 26.0–34.0)
MCHC: 31.3 g/dL (ref 30.0–36.0)
MCV: 83.6 fL (ref 80.0–100.0)
Platelets: 274 10*3/uL (ref 150–400)
RBC: 2.56 MIL/uL — ABNORMAL LOW (ref 3.87–5.11)
RDW: 15.1 % (ref 11.5–15.5)
WBC: 6 10*3/uL (ref 4.0–10.5)
nRBC: 0 % (ref 0.0–0.2)

## 2022-12-08 LAB — TYPE AND SCREEN

## 2022-12-08 LAB — BPAM RBC: Blood Product Expiration Date: 202405062359

## 2022-12-08 LAB — PREPARE RBC (CROSSMATCH)

## 2022-12-08 MED ORDER — FLINTSTONES PLUS EXTRA IRON 18 MG PO CHEW: 1.0000 | CHEWABLE_TABLET | Freq: Every day | ORAL | Status: DC

## 2022-12-08 MED ORDER — LACTATED RINGERS IV BOLUS: 1000.0000 mL | Freq: Three times a day (TID) | INTRAVENOUS | Status: DC | PRN

## 2022-12-08 MED ORDER — SODIUM CHLORIDE 0.9 % IV SOLN
125.0000 mg | Freq: Once | INTRAVENOUS | Status: AC
  Administered 2022-12-08: 125 mg via INTRAVENOUS
  Filled 2022-12-08: qty 10

## 2022-12-08 MED ORDER — SODIUM CHLORIDE 0.9% FLUSH
3.0000 mL | Freq: Two times a day (BID) | INTRAVENOUS | Status: DC
  Administered 2022-12-08: 3 mL via INTRAVENOUS

## 2022-12-08 MED ORDER — SODIUM CHLORIDE 0.9% IV SOLUTION: Freq: Once | INTRAVENOUS | Status: AC

## 2022-12-08 MED ORDER — SODIUM CHLORIDE 0.9% FLUSH: 3.0000 mL | INTRAVENOUS | Status: DC | PRN

## 2022-12-08 MED ORDER — CHILDRENS CHEW MULTIVITAMIN PO CHEW
1.0000 | CHEWABLE_TABLET | Freq: Every day | ORAL | Status: DC
  Administered 2022-12-08: 1 via ORAL
  Filled 2022-12-08 (×2): qty 1

## 2022-12-08 MED ORDER — POLYETHYLENE GLYCOL 3350 17 G PO PACK: 17.0000 g | PACK | Freq: Two times a day (BID) | ORAL | Status: DC | PRN

## 2022-12-08 MED ORDER — METHOCARBAMOL 500 MG PO TABS
500.0000 mg | ORAL_TABLET | Freq: Four times a day (QID) | ORAL | Status: DC
  Administered 2022-12-08 – 2022-12-09 (×4): 500 mg via ORAL
  Filled 2022-12-08 (×4): qty 1

## 2022-12-08 MED ORDER — SODIUM CHLORIDE 0.9 % IV SOLN: 250.0000 mL | INTRAVENOUS | Status: DC | PRN

## 2022-12-08 NOTE — Progress Notes (Signed)
[facility] HIPP [id number] 1966/04/02  CARE TEAM:  PCP: Tollie Eth, NP  Outpatient Care Team: Patient Care Team: Early, Sung Amabile, NP as PCP - General (Nurse Practitioner) Karie Soda, MD as Consulting Physician (Colon and Rectal Surgery) Violeta Gelinas, MD as Consulting Physician (General Surgery) Essie Hart, MD (Inactive) as Referring Physician (Obstetrics and Gynecology) Mansouraty, Netty Starring., MD as Consulting Physician (Gastroenterology)  Inpatient Treatment Team: Treatment Team: Attending Provider: Karie Soda, MD; Mobility Specialist: Lorina Rabon; Technician: Lyman Speller, NT; Registered Nurse: Wilford Corner, RN; Registered Nurse: Riki Sheer, RN; Technician: Vella Raring, NT   Problem List:   Principal Problem:   Incisional hernia, without obstruction or gangrene Active Problems:   Type 2 diabetes mellitus without complication, without long-term current use of insulin (HCC)   Panic disorder   Attention deficit hyperactivity disorder (ADHD), predominantly inattentive type   Depression, recurrent (HCC)   Status post Hartmann's procedure (HCC)   Obesity (BMI 30-39.9)   Incarcerated incisional hernia   4 Days Post-Op  12/04/2022  POST-OPERATIVE DIAGNOSIS:  VENTRAL INCISIONAL ABDOMINAL WALL HERNIAE  Dimensions of hernia post-op:  21cm x 12cm   PROCEDURE:   LYSIS OF ADHESIONS x (1/3 case) COMPONENT SEPARATION (TRANSVERSUS ABDOMINIS RELEASE - BILATERAL) ABDOMINAL WALL RECONSTRUCTION WITH 35X30CM MESH TRANSVERSUS ABDOMINIS PLANE (TAP) BLOCK - BILATERAL   SURGEON:  Ardeth Sportsman, MD  OR FINDINGS:    Patient had a 21 x 12 cm incisional hernia incarcerated with very dense adhesions of small bowel colon and greater omentum.     Type of repair - Component separation repair - Transversus abdominis release (TAR) - bilateral  With mesh reinforcement              Name of mesh - Ventralight Bard polypropylene   Size of mesh - Height  35 cm, Width 30 cm   Orientation:  Diamond    Mesh overlap - 5-10 cm   Placement of mesh - retrorectus underlay   Assessment  Recovering with expected postoperative pain  [facility] Stay = 3 days)  Plan: -Hemoglobin drifting down with some question of orthostasis.  Trans fused 1 unit of blood.  Give some supplemental IV iron as well. -Advance to solid diet -Stop IV fluids  -Multimodal pain control.  Continue binder ice/heat/Tylenol scheduled.  Again patient seems hesitant to take more than just Tylenol.  Will try some scheduled methocarbamol since concern of being sedated on gabapentin.  Oral and IV narcotics for backup.  -VTE prophylaxis- SCDs, etc -ADHD with some depression/anxiety.  Resuming Adderall and Klonopin as needed. Seasonal allergies.  Continuing antihistamine blocker. -mobilize as tolerated to help recovery.  Hesitant to get up much.  Using a walker.  I think she would benefit from PT and home health safety visit as well just in case.  Hopefully will feel stronger once transfused.    Disposition:  Disposition:  The patient is from: Home  Anticipate discharge to:  Home  Anticipated Date of Discharge is:  April 30,2024    Barriers to discharge:  Therapy assessment & Recommendations pending and Pending Clinical improvement (more likely than not)  Patient currently is NOT MEDICALLY STABLE for discharge from the hospital from a surgery standpoint.      I reviewed nursing notes, last 24 h vitals and pain scores, last 48 h intake and output, last 24 h labs and trends, and last 24 h imaging results. I have reviewed this patient's available data, including medical history, events  of note, test results, etc as part of my evaluation.  A significant portion of that time was spent in counseling.  Care during the described time interval was provided by me.  This care required moderate level of medical decision making.  12/08/2022    Subjective: (Chief  complaint)  Patient tired.  Denies pain "if I do not move and stay in bed".  Tolerating some food but not great appetite.   Objective:  Vital signs:  Vitals:   12/07/22 0448 12/07/22 1326 12/07/22 2113 12/08/22 0503  BP: 107/64 100/60 103/72 120/84  Pulse: (!) 105 82 91 87  Resp: 20 18 20 18   Temp: 99.5 F (37.5 C) 98.1 F (36.7 C) 98.7 F (37.1 C) 98.9 F (37.2 C)  TempSrc:   Oral Oral  SpO2: 91% 94% 97% 97%  Weight:      Height:        Last BM Date : 12/04/22  Intake/Output   Yesterday:  04/28 0701 - 04/29 0700 In: 1455.6 [P.O.:480; I.V.:975.6] Out: 120 [Drains:120] This shift:  No intake/output data recorded.  Bowel function:  Flatus: No  BM:  No  Drain: Serosanguinous for both right lower quadrant (subfascial) and left lower quadrant (subcutaneous)   Physical Exam:  General: Pt awake/alert in no acute distress..  Not toxic. Eyes: PERRL, normal EOM.  Sclera clear.  No icterus Neuro: CN II-XII intact w/o focal sensory/motor deficits. Lymph: No head/neck/groin lymphadenopathy Psych:  No delerium/psychosis/paranoia.  Oriented x 4 HENT: Normocephalic, Mucus membranes moist.  No thrush Neck: Supple, No tracheal deviation.  No obvious thyromegaly Chest: No pain to chest wall compression.  Good respiratory excursion.  No audible wheezing CV:  Pulses intact.  Regular rhythm.  No major extremity edema MS: Normal AROM mjr joints.  No obvious deformity  Abdomen: Soft.  Mildy distended.  Mildly tender at incisions only.  Dressing and incisions clean dry and intact.  No evidence of peritonitis.  No incarcerated hernias.  Ext:  No deformity.  No mjr edema.  No cyanosis Skin: No petechiae / purpurea.  No major sores.  Warm and dry    Results:   Cultures: No results found for this or any previous visit (from the past 720 hour(s)).  Labs: Results for orders placed or performed during the hospital encounter of 12/04/22 (from the past 48 hour(s))  CBC      Status: Abnormal   Collection Time: 12/07/22  4:43 AM  Result Value Ref Range   WBC 7.8 4.0 - 10.5 K/uL   RBC 2.68 (L) 3.87 - 5.11 MIL/uL   Hemoglobin 7.0 (L) 12.0 - 15.0 g/dL   HCT 09.8 (L) 11.9 - 14.7 %   MCV 84.0 80.0 - 100.0 fL   MCH 26.1 26.0 - 34.0 pg   MCHC 31.1 30.0 - 36.0 g/dL   RDW 82.9 56.2 - 13.0 %   Platelets 259 150 - 400 K/uL   nRBC 0.0 0.0 - 0.2 %    Comment: Performed at [facility], [address] [place], [place] [address]  CBC     Status: Abnormal   Collection Time: 12/07/22  3:02 PM  Result Value Ref Range   WBC 8.0 4.0 - 10.5 K/uL   RBC 2.85 (L) 3.87 - 5.11 MIL/uL   Hemoglobin 7.5 (L) 12.0 - 15.0 g/dL   HCT 46.9 (L) 62.9 - 52.8 %   MCV 84.2 80.0 - 100.0 fL   MCH 26.3 26.0 - 34.0 pg   MCHC 31.3  30.0 - 36.0 g/dL   RDW 16.1 09.6 - 04.5 %   Platelets 307 150 - 400 K/uL   nRBC 0.0 0.0 - 0.2 %    Comment: Performed at [facility], [address], [place], [place] [address]  CBC     Status: Abnormal   Collection Time: 12/08/22  4:57 AM  Result Value Ref Range   WBC 6.0 4.0 - 10.5 K/uL   RBC 2.56 (L) 3.87 - 5.11 MIL/uL   Hemoglobin 6.7 (LL) 12.0 - 15.0 g/dL    Comment: REPEATED TO VERIFY THIS CRITICAL RESULT HAS VERIFIED AND BEEN CALLED TO BRANT,N. RN BY NICOLE MCCOY ON 04 29 2024 AT 0651, AND HAS BEEN READ BACK.     HCT 21.4 (L) 36.0 - 46.0 %   MCV 83.6 80.0 - 100.0 fL   MCH 26.2 26.0 - 34.0 pg   MCHC 31.3 30.0 - 36.0 g/dL   RDW 19.1 47.8 - 29.5 %   Platelets 274 150 - 400 K/uL   nRBC 0.0 0.0 - 0.2 %    Comment: Performed at [facility], [address], [place], [place] [address]    Imaging / Studies: No results found.  Medications / Allergies: per chart  Antibiotics: Anti-infectives (From admission, onward)    Start     Dose/Rate Route Frequency Ordered Stop   12/04/22 1900  ceFAZolin (ANCEF) IVPB 2g/100 mL premix        2 g 200 mL/hr over 30 Minutes Intravenous Every 8 hours  12/04/22 1507 12/04/22 1854   12/04/22 0600  ceFAZolin (ANCEF) IVPB 2g/100 mL premix        2 g 200 mL/hr over 30 Minutes Intravenous On call to O.R. 12/04/22 0534 12/04/22 1145         Note: Portions of this report may have been transcribed using voice recognition software. Every effort was made to ensure accuracy; however, inadvertent computerized transcription errors may be present.   Any transcriptional errors that result from this process are unintentional.    Ardeth Sportsman, MD, FACS, MASCRS Esophageal, Gastrointestinal & Colorectal Surgery Robotic and Minimally Invasive Surgery  [facility] [address] [place], [place] [address] [phone] Fax [phone] Main  CONTACT INFORMATION:  Weekday (9AM-5PM): Call [facility] main office at [phone]  Weeknight (5PM-9AM) or Weekend/Holiday: Check [email or web address] (password " [email or web address]") for General Surgery [facility] coverage  (Please, do not use SecureChat as it is not reliable communication to reach operating surgeons for immediate patient care given surgeries/outpatient duties/clinic/cross-coverage/off post-call which would lead to a delay in care.  Epic staff messaging available for outptient concerns, but may not be answered for 48 hours or more).     12/08/2022  7:13 AM

## 2022-12-08 NOTE — Progress Notes (Signed)
Patrina Levering, from the lab, called and notified nurse of pt's h/h+ 6.7 and 21.4. Nurse placed page to Dr. Dwain Sarna- on call. He returned call. Informed him of pt's current h/h values, as mentioned above. He stated that they will address this when they round on the pt, today. Informed day shift nurse of this, in addition.

## 2022-12-08 NOTE — Plan of Care (Signed)

## 2022-12-09 LAB — BPAM RBC
ISSUE DATE / TIME: 202404291017
Unit Type and Rh: 600

## 2022-12-09 LAB — HEMOGLOBIN: Hemoglobin: 8 g/dL — ABNORMAL LOW (ref 12.0–15.0)

## 2022-12-09 LAB — TYPE AND SCREEN
ABO/RH(D): A NEG
Antibody Screen: NEGATIVE
Unit division: 0

## 2022-12-09 LAB — POTASSIUM: Potassium: 3.6 mmol/L (ref 3.5–5.1)

## 2022-12-09 LAB — CREATININE, SERUM
Creatinine, Ser: 0.7 mg/dL (ref 0.44–1.00)
GFR, Estimated: >60 mL/min (ref >60)

## 2022-12-09 NOTE — Progress Notes (Signed)
Discharge instructions given to patient and all questions were answered.  

## 2022-12-09 NOTE — Discharge Summary (Signed)
Physician Discharge Summary    Patient ID: Michele [name] MRN: [id number] DOB/AGE: 01/27/1966  57 y.o.  Patient Care Team: Early, Sung Amabile, NP as PCP - General (Nurse Practitioner) Karie Soda, MD as Consulting Physician (Colon and Rectal Surgery) Violeta Gelinas, MD as Consulting Physician (General Surgery) Essie Hart, MD (Inactive) as Referring Physician (Obstetrics and Gynecology) Mansouraty, Netty Starring., MD as Consulting Physician (Gastroenterology)  Admit date: 12/04/2022  Discharge date: 12/09/2022  Hospital Stay = 4 days    Discharge Diagnoses:  Principal Problem:   Incisional hernia, without obstruction or gangrene Active Problems:   Type 2 diabetes mellitus without complication, without long-term current use of insulin (HCC)   Panic disorder   Attention deficit hyperactivity disorder (ADHD), predominantly inattentive type   Depression, recurrent (HCC)   Status post Hartmann's procedure (HCC)   Obesity (BMI 30-39.9)   Incarcerated incisional hernia   5 Days Post-Op  12/04/2022  POST-OPERATIVE DIAGNOSIS:  VENTRAL INCISIONAL ABDOMINAL WALL HERNIAE  Dimensions of hernia post-op:  21cm x 12cm   PROCEDURE:   LYSIS OF ADHESIONS x (1/3 case) COMPONENT SEPARATION (TRANSVERSUS ABDOMINIS RELEASE - BILATERAL) ABDOMINAL WALL RECONSTRUCTION WITH 35X30CM MESH TRANSVERSUS ABDOMINIS PLANE (TAP) BLOCK - BILATERAL   SURGEON:  Ardeth Sportsman, MD   OR FINDINGS:    Patient had a 21 x 12 cm incisional hernia incarcerated with very dense adhesions of small bowel colon and greater omentum.     Type of repair - Component separation repair - Transversus abdominis release (TAR) - bilateral  With mesh reinforcement              Name of mesh - Ventralight Bard polypropylene   Size of mesh - Height 35 cm, Width 30 cm   Orientation:  Diamond    Mesh overlap - 5-10 cm   Placement of mesh - retrorectus underlay   Consults: Case Management / Social Work, Physical  Therapy, Occupational Therapy, Pharmacy, and Anesthesia  Hospital Course:   The patient underwent the surgery above.  Postoperatively, the patient gradually mobilized and advanced to a solid diet.  Required parenteral pain control given soreness.  Had drifting hemoglobin and transfuse 1 unit of blood with improvement.  Hemoglobin stabilized.  IV iron supplementation given.  Back on oral vitamins with iron.  Pain and other symptoms were treated aggressively.    By the time of discharge, the patient was walking well the hallways, eating food, having flatus.  Pain was well-controlled on an oral medications.  Based on meeting discharge criteria and continuing to recover, I felt it was safe for the patient to be discharged from the hospital to further recover with close followup. Postoperative recommendations were discussed in detail.  They are written as well.  Discharged Condition: good  Discharge Exam: Blood pressure 115/74, pulse 70, temperature 98.5 F (36.9 C), temperature source Oral, resp. rate 18, height 5' (1.524 m), weight 76 kg, last menstrual period 10/07/2021, SpO2 97 %.  General: Pt awake/alert/oriented x4 in No acute distress Eyes: PERRL, normal EOM.  Sclera clear.  No icterus Neuro: CN II-XII intact w/o focal sensory/motor deficits. Lymph: No head/neck/groin lymphadenopathy Psych:  No delerium/psychosis/paranoia HENT: Normocephalic, Mucus membranes moist.  No thrush Neck: Supple, No tracheal deviation Chest:  No chest wall pain w good excursion CV:  Pulses intact.  Regular rhythm MS: Normal AROM mjr joints.  No obvious deformity Abdomen: Soft.  Nondistended.  Mildly tender at incisions only.  No evidence of peritonitis.  No incarcerated hernias. Ext:  SCDs BLE.  No mjr edema.  No cyanosis Skin: No petechiae / purpura   Disposition:    Follow-up Information     Karie Soda, MD Follow up on 12/30/2022.   Specialties: General Surgery, Colon and Rectal Surgery Why: To  follow up after your operation        [facility] Follow up on 12/15/2022.   Specialty: General Surgery Why: To have your drain removed & incisions re-checked Contact information: [address] [place] [address] [phone]                Discharge disposition: 01-Home or Self Care       Discharge Instructions     Call MD for:   Complete by: As directed    FEVER > 101.5 F  (temperatures < 101.5 F are not significant)   Call MD for:  extreme fatigue   Complete by: As directed    Call MD for:  persistant dizziness or light-headedness   Complete by: As directed    Call MD for:  persistant nausea and vomiting   Complete by: As directed    Call MD for:  redness, tenderness, or signs of infection (pain, swelling, redness, odor or green/yellow discharge around incision site)   Complete by: As directed    Call MD for:  severe uncontrolled pain   Complete by: As directed    Diet - low sodium heart healthy   Complete by: As directed    Start with a bland diet such as soups, liquids, starchy foods, low fat foods, etc. the first few days at home. Gradually advance to a solid, low-fat, high fiber diet by the end of the first week at home.   Add a fiber supplement to your diet (Metamucil, etc) If you feel full, bloated, or constipated, stay on a full liquid or pureed/blenderized diet for a few days until you feel better and are no longer constipated.   Discharge instructions   Complete by: As directed    See Discharge Instructions If you are not getting better after two weeks or are noticing you are getting worse, contact our office [phone] for further advice.  We may need to adjust your medications, re-evaluate you in the office, send you to the emergency room, or see what other things we can do to help. The clinic staff is available to answer your questions during regular business hours (8:30am-5pm).  Please don't  hesitate to call and ask to speak to one of our nurses for clinical concerns.    A surgeon from [facility] is always on call at the hospitals 24 hours/day If you have a medical emergency, go to the nearest emergency room or call 911.   Discharge wound care:   Complete by: As directed    It is good for closed incisions and even open wounds to be washed every day.  Shower every day.  Short baths are fine.  Wash the incisions and wounds clean with soap & water.    You may leave closed incisions open to air if it is dry.   You may cover the incision with clean gauze & replace it after your daily shower for comfort.  DRAIN:  You have a drain in place.  Every day change the dressing in the shower, wash around the skin exit site with soap & water and place a new dressing of gauze or band aid around the skin every day.  Keep the drain site clean & dry.  Follow up in our surgery office to discuss plans for drain removal in the near future   Driving Restrictions   Complete by: As directed    You may drive when: - you are no longer taking narcotic prescription pain medication - you can comfortably wear a seatbelt - you can safely make sudden turns/stops without pain.   Increase activity slowly   Complete by: As directed    Start light daily activities --- self-care, walking, climbing stairs- beginning the day after surgery.  Gradually increase activities as tolerated.  Control your pain to be active.  Stop when you are tired.  Ideally, walk several times a day, eventually an hour a day.   Most people are back to most day-to-day activities in a few weeks.  It takes 4-6 weeks to get back to unrestricted, intense activity. If you can walk 30 minutes without difficulty, it is safe to try more intense activity such as jogging, treadmill, bicycling, low-impact aerobics, swimming, etc. Save the most intensive and strenuous activity for last (Usually 4-8 weeks after surgery) such as sit-ups, heavy  lifting, contact sports, etc.  Refrain from any intense heavy lifting or straining until you are off narcotics for pain control.  You will have off days, but things should improve week-by-week. DO NOT PUSH THROUGH PAIN.  Let pain be your guide: If it hurts to do something, don't do it.   Lifting restrictions   Complete by: As directed    If you can walk 30 minutes without difficulty, it is safe to try more intense activity such as jogging, treadmill, bicycling, low-impact aerobics, swimming, etc. Save the most intensive and strenuous activity for last (Usually 4-8 weeks after surgery) such as sit-ups, heavy lifting, contact sports, etc.   Refrain from any intense heavy lifting or straining until you are off narcotics for pain control.  You will have off days, but things should improve week-by-week. DO NOT PUSH THROUGH PAIN.  Let pain be your guide: If it hurts to do something, don't do it.  Pain is your body warning you to avoid that activity for another week until the pain goes down.   May shower / Bathe   Complete by: As directed    May walk up steps   Complete by: As directed    Remove dressing in 72 hours   Complete by: As directed    Make sure all dressings are removed by the third day after surgery.  Leave incisions open to air.  OK to cover incisions with gauze or bandages as desired   Sexual Activity Restrictions   Complete by: As directed    You may have sexual intercourse when it is comfortable. If it hurts to do something, stop.       Allergies as of 12/09/2022       Reactions   Atorvastatin Rash, Other (See Comments)   Muscle aches on Lipitor 20 mg and 40 mg    Sulfa Antibiotics Nausea And Vomiting, Other (See Comments), Rash        Medication List     STOP taking these medications    ascorbic acid 500 MG tablet Commonly known as: VITAMIN C   ferrous sulfate 325 (65 FE) MG tablet   traMADol 50 MG tablet Commonly known as: ULTRAM       TAKE these medications     amphetamine-dextroamphetamine 30 MG 24 hr capsule Commonly known as: ADDERALL XR Take 1 capsule (  30 mg total) by mouth daily. What changed: how much to take   citalopram 20 MG tablet Commonly known as: CELEXA Take 1 tablet (20 mg total) by mouth daily.   clonazePAM 1 MG tablet Commonly known as: KLONOPIN Take 0.5-1 tablets (0.5-1 mg total) by mouth 2 (two) times daily as needed (severe panic symptoms). What changed:  when to take this reasons to take this   FLINTSTONES PLUS EXTRA IRON PO Take 1 tablet by mouth daily.   fluticasone 50 MCG/ACT nasal spray Commonly known as: FLONASE Place 1 spray into both nostrils daily.   melatonin 5 MG Tabs Take 5 mg by mouth at bedtime as needed (sleep).   methocarbamol 500 MG tablet Commonly known as: ROBAXIN Take 1 tablet (500 mg total) by mouth every 8 (eight) hours as needed for muscle spasms.   mometasone 0.1 % cream Commonly known as: ELOCON Apply 1 Application topically as needed for itching.   oxyCODONE 5 MG immediate release tablet Commonly known as: Oxy IR/ROXICODONE Take 1 tablet (5 mg total) by mouth every 6 (six) hours as needed for moderate pain, severe pain or breakthrough pain.   polyethylene glycol 17 g packet Commonly known as: MIRALAX / GLYCOLAX Take 17 g by mouth 2 (two) times daily as needed. What changed:  how much to take when to take this   rosuvastatin 5 MG tablet Commonly known as: Crestor Take 1 tablet (5 mg total) by mouth daily.   VITAMIN D (CHOLECALCIFEROL) PO Take 1 capsule by mouth 3 (three) times a week.               Discharge Care Instructions  (From admission, onward)           Start     Ordered   12/05/22 0000  Discharge wound care:       Comments: It is good for closed incisions and even open wounds to be washed every day.  Shower every day.  Short baths are fine.  Wash the incisions and wounds clean with soap & water.    You may leave closed incisions open to air if it  is dry.   You may cover the incision with clean gauze & replace it after your daily shower for comfort.  DRAIN:  You have a drain in place.  Every day change the dressing in the shower, wash around the skin exit site with soap & water and place a new dressing of gauze or band aid around the skin every day.  Keep the drain site clean & dry.  Follow up in our surgery office to discuss plans for drain removal in the near future   12/05/22 0640            Significant Diagnostic Studies:  Results for orders placed or performed during the hospital encounter of 12/04/22 (from the past 72 hour(s))  CBC     Status: Abnormal   Collection Time: 12/07/22  4:43 AM  Result Value Ref Range   WBC 7.8 4.0 - 10.5 K/uL   RBC 2.68 (L) 3.87 - 5.11 MIL/uL   Hemoglobin 7.0 (L) 12.0 - 15.0 g/dL   HCT 40.9 (L) 81.1 - 91.4 %   MCV 84.0 80.0 - 100.0 fL   MCH 26.1 26.0 - 34.0 pg   MCHC 31.1 30.0 - 36.0 g/dL   RDW 78.2 95.6 - 21.3 %   Platelets 259 150 - 400 K/uL   nRBC 0.0 0.0 - 0.2 %    Comment:  Performed at [facility], [address], [place], [place] [address]  CBC     Status: Abnormal   Collection Time: 12/07/22  3:02 PM  Result Value Ref Range   WBC 8.0 4.0 - 10.5 K/uL   RBC 2.85 (L) 3.87 - 5.11 MIL/uL   Hemoglobin 7.5 (L) 12.0 - 15.0 g/dL   HCT 60.4 (L) 54.0 - 98.1 %   MCV 84.2 80.0 - 100.0 fL   MCH 26.3 26.0 - 34.0 pg   MCHC 31.3 30.0 - 36.0 g/dL   RDW 19.1 47.8 - 29.5 %   Platelets 307 150 - 400 K/uL   nRBC 0.0 0.0 - 0.2 %    Comment: Performed at [facility], [address], [place], [place] [address]  CBC     Status: Abnormal   Collection Time: 12/08/22  4:57 AM  Result Value Ref Range   WBC 6.0 4.0 - 10.5 K/uL   RBC 2.56 (L) 3.87 - 5.11 MIL/uL   Hemoglobin 6.7 (LL) 12.0 - 15.0 g/dL    Comment: REPEATED TO VERIFY THIS CRITICAL RESULT HAS VERIFIED AND BEEN CALLED TO BRANT,N. RN BY NICOLE MCCOY ON 04 29 2024 AT 0651, AND HAS BEEN READ BACK.      HCT 21.4 (L) 36.0 - 46.0 %   MCV 83.6 80.0 - 100.0 fL   MCH 26.2 26.0 - 34.0 pg   MCHC 31.3 30.0 - 36.0 g/dL   RDW 86.5 78.4 - 69.6 %   Platelets 274 150 - 400 K/uL   nRBC 0.0 0.0 - 0.2 %    Comment: Performed at [facility], [address] [place], [place] [address]  Type and screen [facility]     Status: None (Preliminary result)   Collection Time: 12/08/22  8:16 AM  Result Value Ref Range   ABO/RH(D) A NEG    Antibody Screen NEG    Sample Expiration 12/11/2022,2359    Unit Number [id number]    Blood Component Type RBC, LR IRR    Unit division 00    Status of Unit ISSUED    Transfusion Status OK TO TRANSFUSE    Crossmatch Result      Compatible Performed at [facility], [address], [place], [place] [address]   Prepare RBC (crossmatch)     Status: None   Collection Time: 12/08/22  8:16 AM  Result Value Ref Range   Order Confirmation      ORDER PROCESSED BY BLOOD BANK Performed at [facility], [address] [place], [place] [address]   Hemoglobin     Status: Abnormal   Collection Time: 12/09/22  4:54 AM  Result Value Ref Range   Hemoglobin 8.0 (L) 12.0 - 15.0 g/dL    Comment: Performed at [facility], [address] [place], [place] [address]  Creatinine, serum     Status: None   Collection Time: 12/09/22  4:54 AM  Result Value Ref Range   Creatinine, Ser 0.70 0.44 - 1.00 mg/dL   GFR, Estimated >38 >75 mL/min    Comment: (NOTE) Calculated using the CKD-EPI Creatinine Equation (2021) Performed at [facility], [address] [place], [place] [address]   Potassium     Status: None   Collection Time: 12/09/22  4:54 AM  Result Value Ref Range   Potassium 3.6 3.5 - 5.1 mmol/L    Comment: Performed at [facility], [address], [place], [place] [address]    No results found.  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 March 2023 11/05/2021   Depression    Hyperlipidemia    Intra-abdominal abscess (HCC) 12/01/2021   Pre-diabetes    S/P TAH (total abdominal hysterectomy) 10/25/2021    Past Surgical History:  Procedure Laterality Date   CESAREAN SECTION     TWO of them   COLOSTOMY Left 11/05/2021   Procedure: COLOSTOMY;  Surgeon: Essie Hart, MD;  Location: [facility] OR;  Service: Gynecology;  Laterality: Left;   CYSTOSCOPY  10/25/2021   Procedure: CYSTOSCOPY;  Surgeon: Essie Hart, MD;  Location: [facility] OR;  Service: Gynecology;;   dental implants N/A 2016   lower 2 teeth   HYSTERECTOMY ABDOMINAL WITH SALPINGECTOMY  10/25/2021   Procedure: HYSTERECTOMY ABDOMINAL WITH BILATERAL SALPINGECTOMY;  Surgeon: Essie Hart, MD;  Location: [facility] OR;  Service: Gynecology;;   HYSTEROSCOPY WITH D & C  01/30/2017   Procedure: DILATATION AND CURETTAGE /HYSTEROSCOPY;  Surgeon: Essie Hart, MD;  Location: [facility] ORS;  Service: Gynecology;;   INCISIONAL HERNIA REPAIR N/A 12/04/2022   Procedure: OPEN INCISIONAL HERNIA REPAIR WITH MESH AND ABDOMINAL RECONSTRUCTION AND COMPONENT SEPARATION;  Surgeon: Karie Soda, MD;  Location: [facility] ORS;  Service: General;  Laterality: N/A;  GEN w/[facility] PATHWAY LOCAL   IR CATHETER TUBE CHANGE  12/02/2021   IR GUIDED DRAIN W CATHETER PLACEMENT  12/02/2021   IR RADIOLOGIST EVAL & MGMT  12/17/2021   IR SINUS/FIST TUBE CHK-NON GI  12/02/2021   LAPAROSCOPIC LYSIS OF ADHESIONS N/A 07/09/2022   Procedure: ROBOTIC AND LAP LYSIS OF ADHESIONS, BILATERAL TAP BLOCK;  Surgeon: Karie Soda, MD;  Location: [facility];  Service: General;  Laterality: N/A;   LAPAROSCOPY  10/25/2021   Procedure: LAPAROSCOPY DIAGNOSTIC;  Surgeon: Essie Hart, MD;  Location: [facility] OR;  Service: Gynecology;;    LAPAROTOMY N/A 11/05/2021   Procedure: EXPLORATORY LAPAROTOMY AND COLECTOMY ;  Surgeon: Essie Hart, MD;  Location: [facility];  Service: Gynecology;  Laterality: N/A;   LAPAROTOMY N/A 11/14/2021   Procedure: EXPLORATORY  LAPAROTOMY AND ABDOMINAL WALL CLOSURE;  Surgeon: Harriette Bouillon, MD;  Location: [facility];  Service: General;  Laterality: N/A;   PROCTOSCOPY N/A 07/09/2022   Procedure: RIGID PROCTOSCOPY;  Surgeon: Karie Soda, MD;  Location: [facility];  Service: General;  Laterality: N/A;   XI ROBOTIC ASSISTED COLOSTOMY TAKEDOWN N/A 07/09/2022   Procedure: ROBOTIC OSTOMY TAKEDOWN;  Surgeon: Karie Soda, MD;  Location: [facility];  Service: General;  Laterality: N/A;  GEN w/ [facility] PATHWAY LOCAL    Social History   Socioeconomic History   Marital status: Divorced    Spouse name: Not on file   Number of children: Not on file   Years of education: Not on file   Highest education level: Not on file  Occupational History   Not on file  Tobacco Use   Smoking status: Former    Packs/day: 0.50    Years: 15.00    Additional pack years: 0.00    Total pack years: 7.50    Types: Cigarettes    Quit date: 2003    Years since quitting: 21.3   Smokeless tobacco: Never   Tobacco comments:    quit 2003  Vaping Use   Vaping Use: Never used  Substance and Sexual Activity   Alcohol use: No   Drug use: No   Sexual activity: Yes    Birth control/protection: Surgical    Comment: 2006  Other Topics Concern   Not on file  Social History Narrative   The pt is a homemaker -pt quit smoking cigarettes 2004    Social Determinants of Health   Financial Resource Strain: Not on file  Food Insecurity: No Food Insecurity (12/04/2022)   Hunger Vital Sign    Worried About Running Out of Food in the Last Year: Never true    Ran Out of Food in the Last Year: Never true  Transportation Needs: No Transportation Needs (12/04/2022)   PRAPARE - Administrator, Civil Service (Medical): No    Lack of  Transportation (Non-Medical): No  Physical Activity: Not on file  Stress: Not on file  Social Connections: Not on file  Intimate Partner Violence: Not At Risk (12/04/2022)   Humiliation, Afraid, Rape, and Kick questionnaire    Fear of Current or Ex-Partner: No    Emotionally Abused: No    Physically Abused: No    Sexually Abused: No    Family History  Problem Relation Age of Onset   Colon polyps Mother    Heart attack Father    Hypertension Sister    Colon cancer Neg Hx    Esophageal cancer Neg Hx    Rectal cancer Neg Hx    Stomach cancer Neg Hx     Current Facility-Administered Medications  Medication Dose Route Frequency Provider Last Rate Last Admin   0.9 %  sodium chloride infusion  250 mL Intravenous PRN Karie Soda, MD       acetaminophen (TYLENOL) tablet 1,000 mg  1,000 mg Oral Trecia Rogers, MD   1,000 mg at 12/09/22 0509   alum & mag hydroxide-simeth (MAALOX/MYLANTA) 200-200-20 MG/5ML suspension 30 mL  30 mL Oral Q6H PRN Karie Soda, MD       amphetamine-dextroamphetamine (ADDERALL XR) 24 hr capsule 15 mg  15 mg Oral Daily Karie Soda, MD       childrens multivitamin chewable tablet 1 tablet  1 tablet Oral Daily Karie Soda, MD   1 tablet at 12/08/22 0859   citalopram (CELEXA) tablet 20 mg  20 mg Oral Daily Karie Soda, MD   20 mg at 12/08/22 0858   clonazePAM (KLONOPIN) tablet 0.5-1 mg  0.5-1 mg Oral BID PRN Karie Soda, MD       diphenhydrAMINE (BENADRYL) 12.5 MG/5ML elixir 12.5 mg  12.5 mg Oral Q6H PRN Karie Soda, MD       Or   diphenhydrAMINE (BENADRYL) injection 12.5 mg  12.5 mg Intravenous Q6H PRN Karie Soda, MD       fluticasone (FLONASE) 50 MCG/ACT nasal spray 1 spray  1 spray Each Nare Daily Karie Soda, MD   1 spray at 12/08/22 0858   HYDROmorphone (DILAUDID) injection 0.5-2 mg  0.5-2 mg Intravenous Q4H PRN Karie Soda, MD   1 mg at 12/04/22 2053   lactated ringers bolus 1,000 mL  1,000 mL Intravenous Q8H PRN Karie Soda, MD        lip balm (CARMEX) ointment   Topical BID Karie Soda, MD   1 Application at 12/08/22 2129   magic mouthwash  15 mL Oral QID PRN Karie Soda, MD       magnesium hydroxide (MILK OF MAGNESIA) suspension 30 mL  30 mL Oral  Daily PRN Karie Soda, MD       melatonin tablet 5 mg  5 mg Oral QHS PRN Karie Soda, MD       menthol-cetylpyridinium (CEPACOL) lozenge 3 mg  1 lozenge Oral PRN Karie Soda, MD   3 mg at 12/04/22 2052   methocarbamol (ROBAXIN) tablet 500 mg  500 mg Oral QID Karie Soda, MD   500 mg at 12/08/22 2128   metoprolol tartrate (LOPRESSOR) injection 5 mg  5 mg Intravenous Q6H PRN Karie Soda, MD       ondansetron [facility]) injection 4 mg  4 mg Intravenous Q6H PRN Karie Soda, MD       Or   ondansetron (ZOFRAN) 8 mg in sodium chloride 0.9 % 50 mL IVPB  8 mg Intravenous Q6H PRN Karie Soda, MD       oxyCODONE (Oxy IR/ROXICODONE) immediate release tablet 5-10 mg  5-10 mg Oral Q4H PRN Karie Soda, MD   10 mg at 12/09/22 0404   phenol (CHLORASEPTIC) mouth spray 2 spray  2 spray Mouth/Throat PRN Karie Soda, MD       polyethylene glycol (MIRALAX / GLYCOLAX) packet 17 g  17 g Oral BID Karie Soda, MD   17 g at 12/07/22 0926   polyethylene glycol (MIRALAX / GLYCOLAX) packet 17 g  17 g Oral Q12H PRN Karie Soda, MD       prochlorperazine (COMPAZINE) tablet 10 mg  10 mg Oral Q6H PRN Karie Soda, MD       Or   prochlorperazine (COMPAZINE) injection 5-10 mg  5-10 mg Intravenous Q6H PRN Karie Soda, MD       rosuvastatin (CRESTOR) tablet 5 mg  5 mg Oral Daily Karie Soda, MD   5 mg at 12/08/22 0858   simethicone (MYLICON) chewable tablet 40 mg  40 mg Oral Q6H PRN Karie Soda, MD   40 mg at 12/05/22 0014   sodium chloride flush (NS) 0.9 % injection 3 mL  3 mL Intravenous Catha Gosselin, MD   3 mL at 12/08/22 2129   sodium chloride flush (NS) 0.9 % injection 3 mL  3 mL Intravenous PRN Karie Soda, MD         Allergies  Allergen Reactions   Atorvastatin Rash and  Other (See Comments)    Muscle aches on Lipitor 20 mg and 40 mg     Sulfa Antibiotics Nausea And Vomiting, Other (See Comments) and Rash    Signed:   Ardeth Sportsman, MD, FACS, MASCRS Esophageal, Gastrointestinal & Colorectal Surgery Robotic and Minimally Invasive Surgery  [facility] [address] [place], [place] [address] [phone] Fax [phone] Main  CONTACT INFORMATION:  Weekday (9AM-5PM): Call [facility] main office at [phone]  Weeknight (5PM-9AM) or Weekend/Holiday: Check [email or web address] (password " [email or web address]") for General Surgery [facility] coverage  (Please, do not use SecureChat as it is not reliable communication to reach operating surgeons for immediate patient care given surgeries/outpatient duties/clinic/cross-coverage/off post-call which would lead to a delay in care.  [facility] staff messaging available for outptient concerns, but may not be answered for 48 hours or more).     12/09/2022, 7:48 AM

## 2022-12-09 NOTE — TOC CM/SW Note (Signed)
MD requesting HHPT for home safety visit.  Spoke with pt who does not feel this is needed as her home is "already [place] - proofed".  Pt feels safe to return home with self care.    Carmino Ocain, LCSW

## 2022-12-10 ENCOUNTER — Telehealth: Payer: Self-pay

## 2022-12-10 NOTE — Transitions of Care (Post Inpatient/ED Visit) (Signed)
   12/10/2022  Name: Michele [name] MRN: [id number] DOB: [birth year]  Today's TOC FU Call Status: Today's TOC FU Call Status:: Unsuccessul Call (1st Attempt) Unsuccessful Call (1st Attempt) Date: 12/10/22  Attempted to reach the patient regarding the most recent Inpatient/ED visit.  Follow Up Plan: Additional outreach attempts will be made to reach the patient to complete the Transitions of Care (Post Inpatient/ED visit) call.     Antionette Fairy, RN,BSN,CCM [facility] Care Management Care Management Community Coordinator Direct Phone: [phone] Toll Free: [phone] Fax: [phone]

## 2022-12-11 ENCOUNTER — Telehealth: Payer: Self-pay

## 2022-12-11 ENCOUNTER — Other Ambulatory Visit (HOSPITAL_BASED_OUTPATIENT_CLINIC_OR_DEPARTMENT_OTHER): Payer: Self-pay

## 2022-12-11 ENCOUNTER — Other Ambulatory Visit: Payer: Self-pay

## 2022-12-11 NOTE — Transitions of Care (Post Inpatient/ED Visit) (Signed)
   12/11/2022  Name: Michele [name] MRN: [id number] DOB: Mar 21, 1966  Today's TOC FU Call Status: Today's TOC FU Call Status:: Unsuccessful Call (2nd Attempt) Unsuccessful Call (2nd Attempt) Date: 12/11/22  Attempted to reach the patient regarding the most recent Inpatient/ED visit.  Follow Up Plan: Additional outreach attempts will be made to reach the patient to complete the Transitions of Care (Post Inpatient/ED visit) call.     Antionette Fairy, RN,BSN,CCM [facility] Care Management Care Management Community Coordinator Direct Phone: [phone] Toll Free: [phone] Fax: [phone]

## 2022-12-11 NOTE — Transitions of Care (Post Inpatient/ED Visit) (Signed)
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 She is eating small portions and drinking shakes. LBM was today.) Any questions or concerns?: Yes Patient Questions/Concerns:: Pt wanting to know if she could get refill on Oxycodone prior to follow up appt as she will run out before then. Patient Questions/Concerns Addressed: Other: (Pt instructed to follow up with surgeon office regarding refill on pain meds. She will call office to discuss.)  Items Reviewed: Did you receive and understand the discharge instructions provided?: Yes Medications obtained,verified, and reconciled?: Yes (Medications Reviewed) Any new allergies since your discharge?: No Dietary orders reviewed?: Yes Type of Diet Ordered:: low fat/high fiber-adv as tolerated Do you have support at home?: Yes People in Home: child(ren), adult, parent(s) Name of Support/Comfort Primary Source: pt states her mom is in town and staying wtih her for a few days, she also has adult son and daughter in the home to assist her  Medications Reviewed Today: Medications Reviewed Today     Reviewed by Donalynn Furlong, RN (Registered Nurse) on 12/04/22 at [phone]  Med List Status: Complete   Medication Order Taking? Sig Documenting Provider Last Dose Status Informant  amphetamine-dextroamphetamine (ADDERALL XR) 30 MG 24 hr capsule [id number]  Yes Take 1 capsule (30 mg total) by mouth daily.  Patient taking differently: Take 15 mg by mouth daily.   Tollie Eth, NP 12/03/2022 Active Self  ascorbic acid (VITAMIN C) 500 MG tablet [id number] No Take 1 tablet (500 mg total) by mouth 2 (two) times daily.  Patient not taking: Reported on 11/27/2022   Adam Phenix, PA-C Not Taking Active Self  citalopram (CELEXA) 20 MG tablet [id number] Yes Take 1 tablet (20 mg total) by mouth daily. Tollie Eth, NP 12/03/2022 Active Self  clonazePAM (KLONOPIN) 1 MG tablet [id number] Yes Take 0.5-1 tablets (0.5-1 mg total) by mouth 2 (two) times daily as needed (severe panic symptoms).  Patient taking differently: Take 0.5-1 mg by mouth daily as needed for anxiety.   Tollie Eth, NP Past Month Active Self  ferrous sulfate 325 (65 FE) MG tablet [id number] No Take 1 tablet (325 mg total) by mouth daily with breakfast.  Patient not taking: Reported on 11/27/2022   Adam Phenix, PA-C Not Taking Active Self  fluticasone (FLONASE) 50 MCG/ACT nasal spray [id number] Yes Place 1 spray into both nostrils daily. [provider] 12/03/2022 Active Self  melatonin 5 MG TABS [id number] Yes Take 5 mg by mouth at bedtime as needed (sleep). [provider] Past Month Active Self  mometasone (ELOCON) 0.1 % cream [id number] No Apply 1 Application topically as needed for itching.  Patient not taking: Reported on 06/27/2022   Early, Sung Amabile, NP Not Taking Active Self  Pediatric Multivitamins-Iron Kirke Corin PLUS EXTRA IRON PO) [id number] Yes Take 1 tablet by mouth daily. [provider] Past Week Active Self  polyethylene glycol (MIRALAX / GLYCOLAX) 17 g packet [id number] Yes Take 17 g by mouth 2 (two)  times daily as needed.  Patient taking differently: Take 17-34 g by mouth every evening.   Adam Phenix, [place] 12/03/2022 Active Self  rosuvastatin (CRESTOR) 5 MG tablet [id number] Yes Take 1 tablet (5 mg total) by mouth daily. Tollie Eth,  NP 12/02/2022 Active Self  traMADol (ULTRAM) 50 MG tablet [id number] No Take 1-2 tablets (50-100 mg total) by mouth every 6 (six) hours as needed for moderate pain or severe pain.  Patient not taking: Reported on 11/27/2022   Karie Soda, MD Not Taking Active Self  VITAMIN D, CHOLECALCIFEROL, PO [id number] Yes Take 1 capsule by mouth 3 (three) times a week. [provider] Past Week Active Self            Home Care and Equipment/Supplies: Were Home Health Services Ordered?: NA Any new equipment or medical supplies ordered?: NA  Functional Questionnaire: Do you need assistance with bathing/showering or dressing?: Yes Do you need assistance with meal preparation?: Yes Do you need assistance with eating?: No Do you have difficulty maintaining continence: No Do you need assistance with getting out of bed/getting out of a chair/moving?: Yes Do you have difficulty managing or taking your medications?: No  Follow up appointments reviewed: PCP Follow-up appointment confirmed?: NA Specialist Hospital Follow-up appointment confirmed?: Yes Date of Specialist follow-up appointment?: 12/15/22 Follow-Up Specialty Provider:: [facility] -drain/suture removal, Dr. Gross-12/11/22 Do you need transportation to your follow-up appointment?: No Do you understand care options if your condition(s) worsen?: Yes-patient verbalized understanding  SDOH Interventions Today    Flowsheet Row Most Recent Value  SDOH Interventions   Food Insecurity Interventions Intervention Not Indicated  Transportation Interventions Intervention Not Indicated      TOC Interventions Today    Flowsheet Row Most Recent Value  TOC Interventions   TOC Interventions Discussed/Reviewed TOC Interventions Discussed, Post discharge activity limitations per provider, S/S of infection, Post op wound/incision care  [reviewed drain care- pt states she put out about 40cc yest and has put out 80cc today]      Interventions Today     Flowsheet Row Most Recent Value  General Interventions   General Interventions Discussed/Reviewed General Interventions Discussed, Doctor Visits  Doctor Visits Discussed/Reviewed Doctor Visits Discussed, Specialist  PCP/Specialist Visits Compliance with follow-up visit  Education Interventions   Education Provided Provided Education  Provided Verbal Education On Nutrition, When to see the doctor, Medication, Other  Nutrition Interventions   Nutrition Discussed/Reviewed Nutrition Discussed, Adding fruits and vegetables, Increaing proteins, Decreasing salt  Pharmacy Interventions   Pharmacy Dicussed/Reviewed Pharmacy Topics Discussed, Medications and their functions  Safety Interventions   Safety Discussed/Reviewed Safety Discussed        Alessandra Grout [facility] Care Management Care Management Community Coordinator Direct Phone: [phone] Toll Free: [phone] Fax: [phone]

## 2023-03-09 ENCOUNTER — Ambulatory Visit: Payer: Self-pay | Admitting: Nurse Practitioner

## 2023-03-16 IMAGING — DX DG ABD PORTABLE 1V
1 series · 1 of 1 positions shown · non-contrast
Comparison: 11/10/2021

CLINICAL DATA: Persistent abdominal pain pain. History of bowel
perforation.

EXAM:
PORTABLE ABDOMEN - 1 VIEW

[abdomen kub]
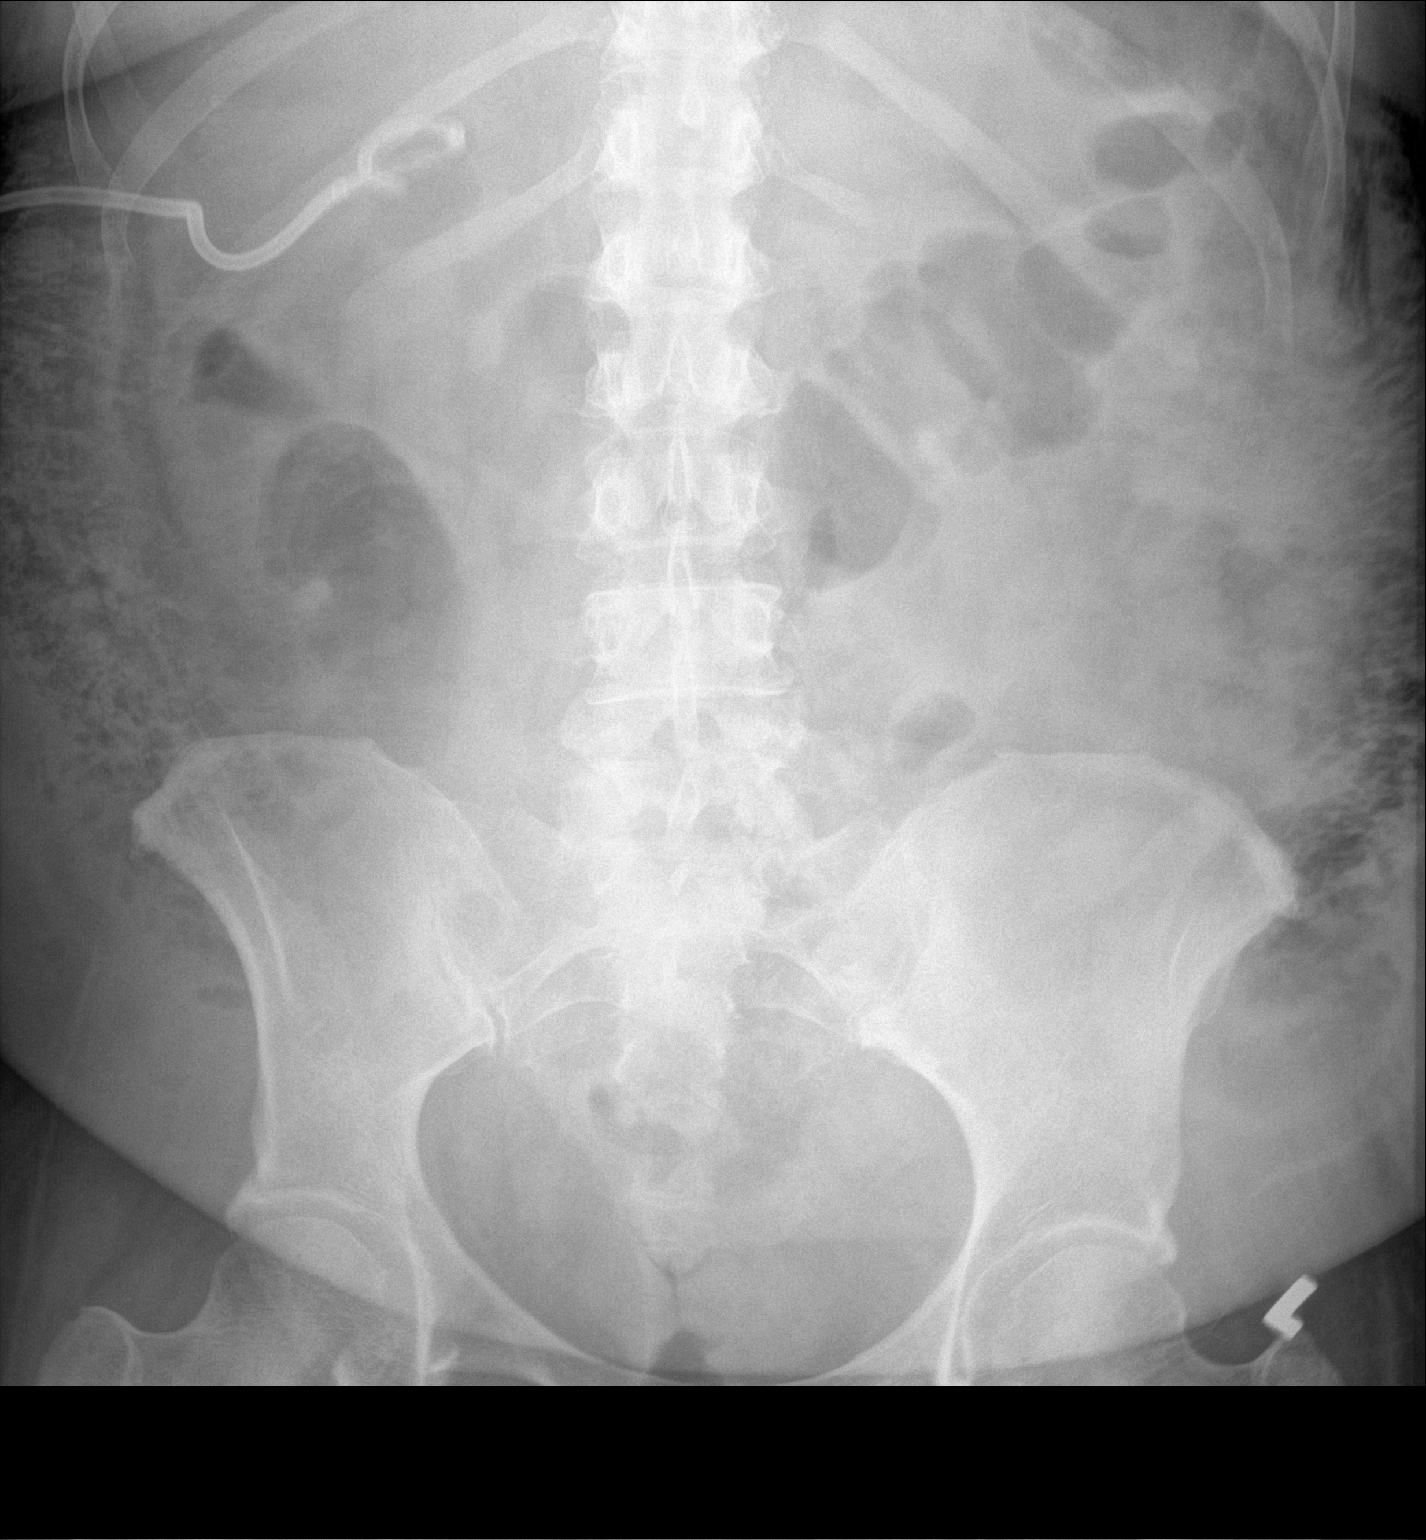

[1 of 1 positions shown; findings below may reference images not displayed]

FINDINGS: Drain in place in the right upper quadrant. No sign of small bowel
obstruction or colon obstruction. Left lower abdominal colostomy.
Gas within the soft tissues of the abdominal wall is again
demonstrated.
IMPRESSION: Right upper quadrant region drain.

Air within the soft tissues of the abdominal wall as seen on the
previous CT.

## 2023-04-17 ENCOUNTER — Ambulatory Visit: Payer: Self-pay | Admitting: Nurse Practitioner

## 2023-04-17 NOTE — Progress Notes (Deleted)
  Tollie Eth, DNP, AGNP-c [facility] [address] [place], [place] [address] [phone]   ACUTE VISIT- ESTABLISHED PATIENT  Last menstrual period 10/07/2021.  Subjective:  HPI Michele [name] is a 57 y.o. female presents to day for evaluation of acute concern(s).   Anxiety  ROS negative except for what is listed in HPI. History, Medications, Surgery, SDOH, and Family History reviewed and updated as appropriate.  Objective:  Physical Exam      Assessment & Plan:   Problem List Items Addressed This Visit   None     Time: *** minutes, >50% spent counseling, care coordination, chart review, and documentation.   Tollie Eth, DNP, AGNP-c

## 2023-04-19 IMAGING — CT CT ABD-PELV W/ CM
2 of 4 series · 12 of 46 positions shown, 14 images · IV contrast (agent unspecified)
Comparison: 12/01/2021

CLINICAL DATA: Multiple abdominal abscesses status post colonic
perforation.

EXAM:
CT ABDOMEN AND PELVIS WITH CONTRAST
TECHNIQUE: Multidetector CT imaging of the abdomen and pelvis was performed
using the standard protocol following bolus administration of
intravenous contrast.

[Series 2: abd pelvis 5.00 br40 s3 axial · axial · 0.61mm/px · z∈[+1265,+1680]mm · 9 of 99 slices shown, 11 images]
[im 8/99  soft-tissue]
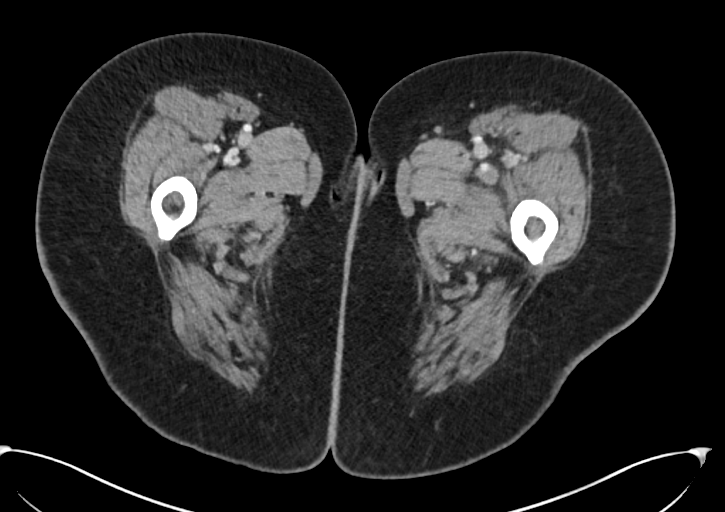
[im 8/99  bone]
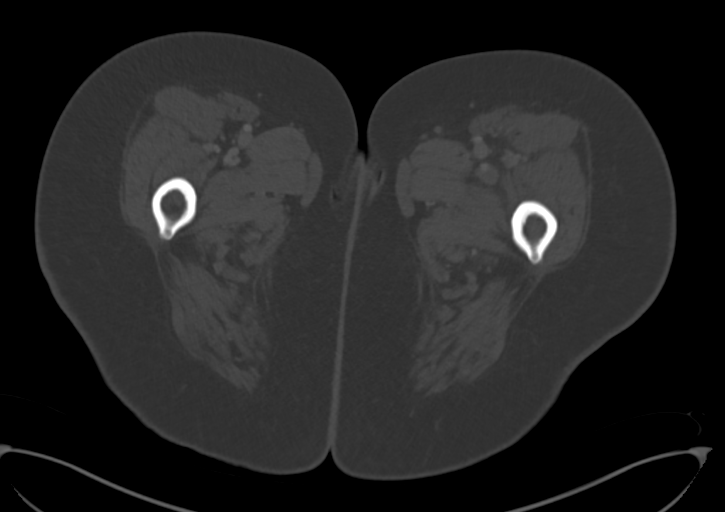
[im 20/99  soft-tissue]
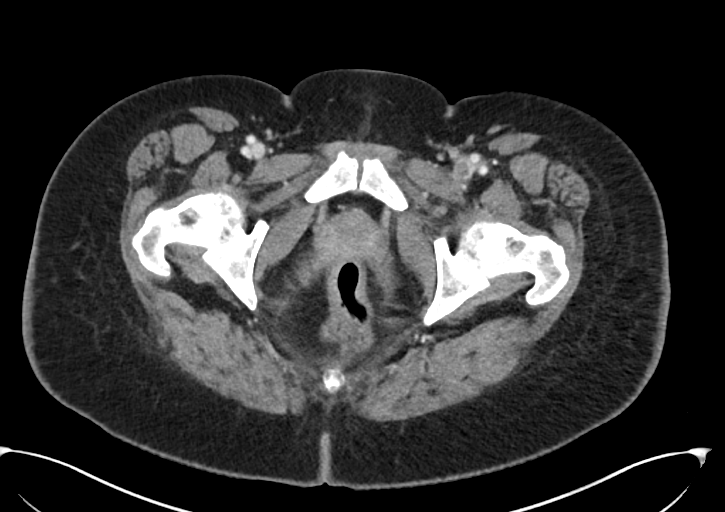
[im 28/99  soft-tissue]
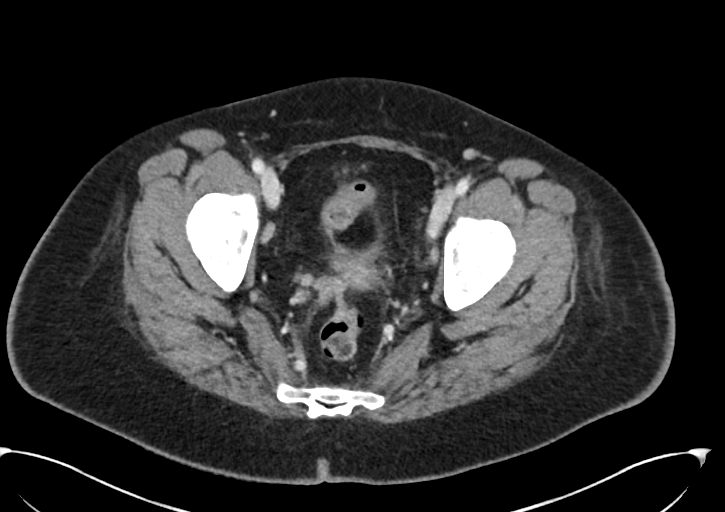
[im 40/99  soft-tissue]
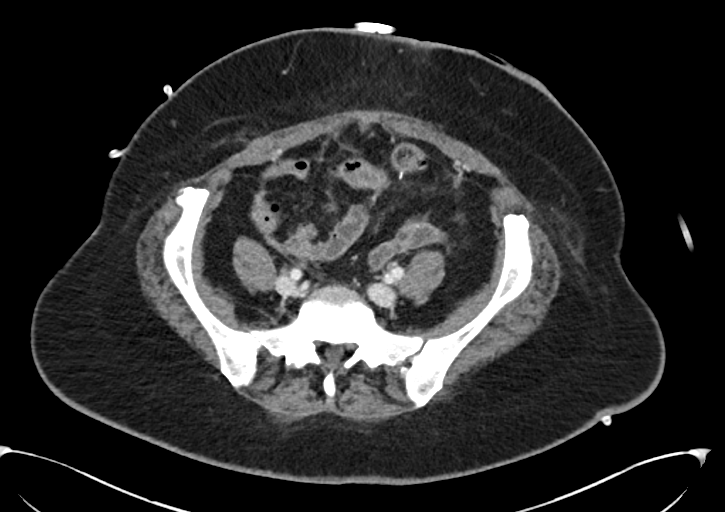
[im 51/99  soft-tissue]
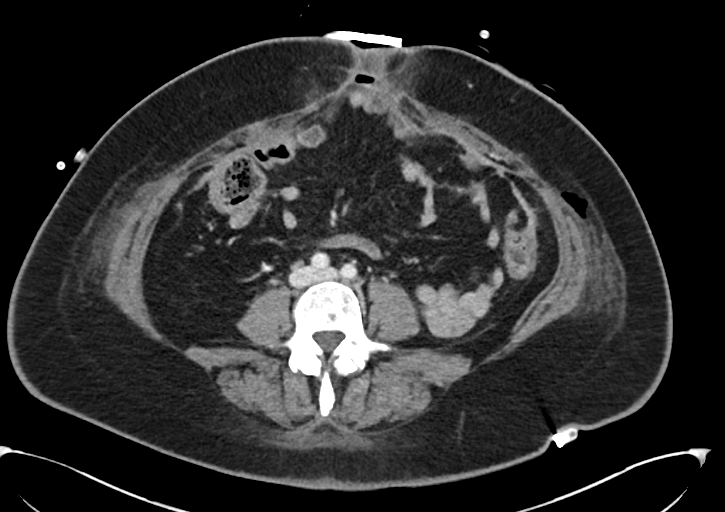
[im 59/99  soft-tissue]
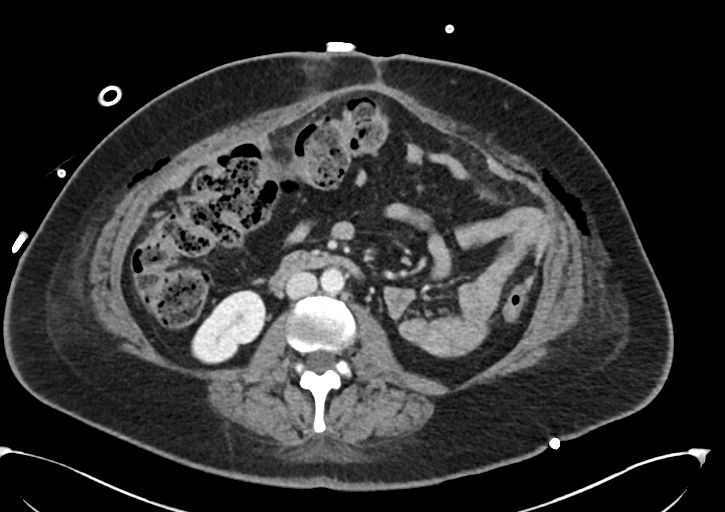
[im 71/99  soft-tissue]
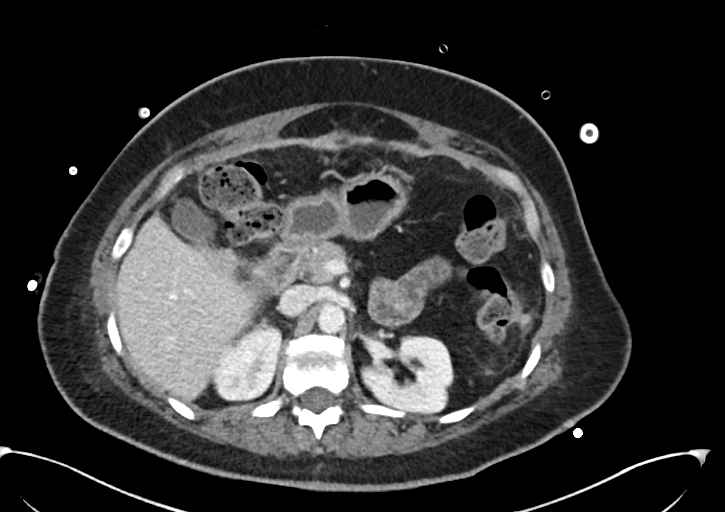
[im 83/99  soft-tissue]
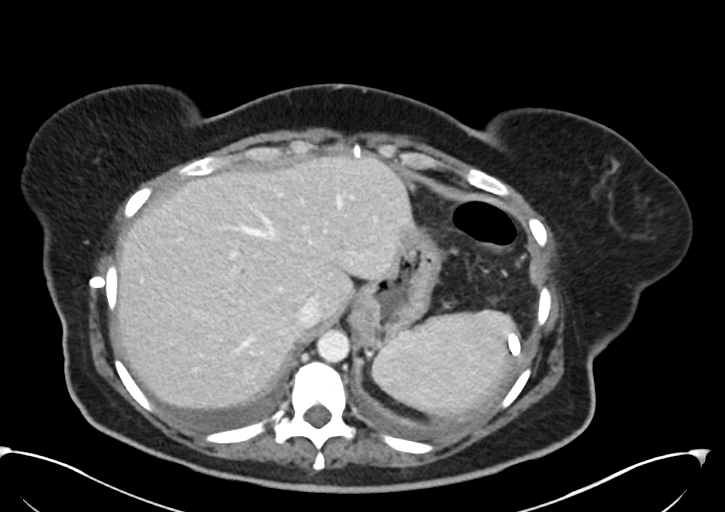
[im 91/99  soft-tissue]
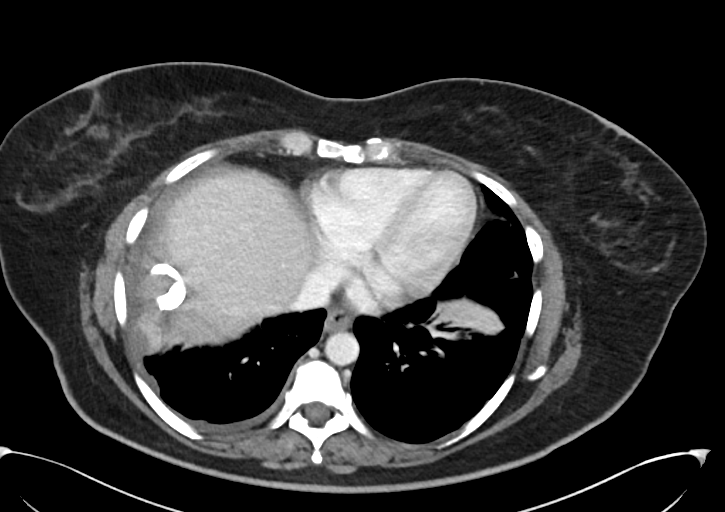
[im 91/99  bone]
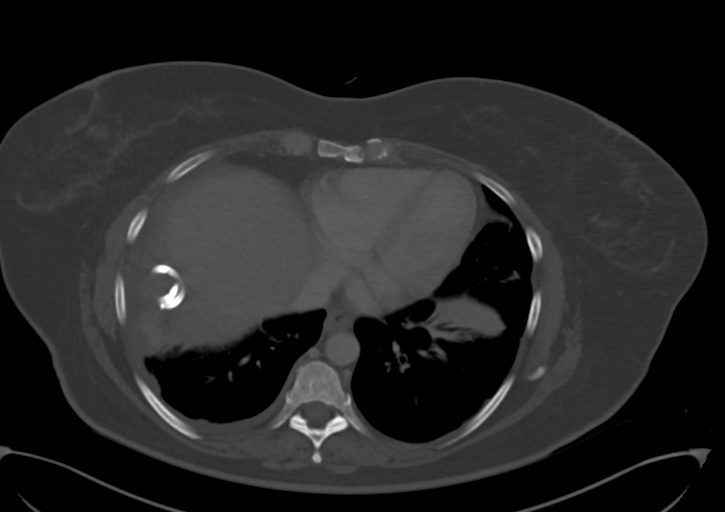

[Series 6: abd pelvis 2.00 br40 s3 cor · coronal · 0.85mm/px · 3 of 146 slices shown]
[im 49/146  soft-tissue]
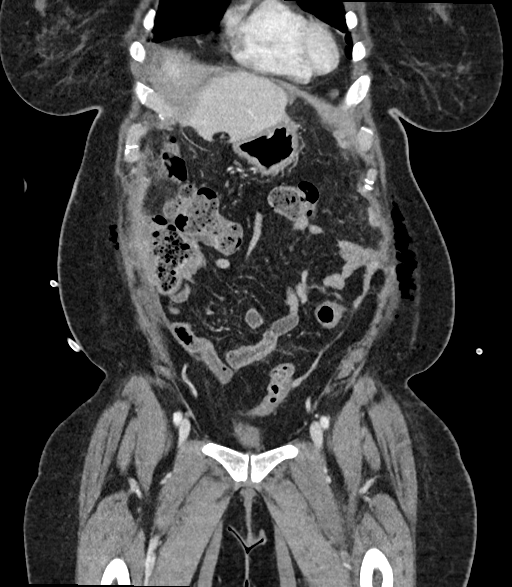
[im 65/146  soft-tissue]
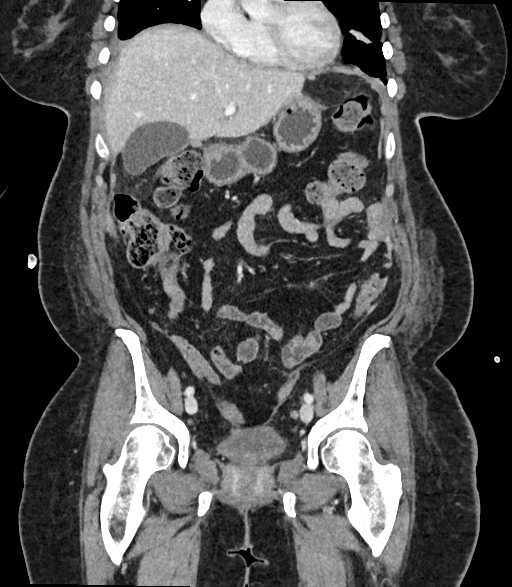
[im 81/146  soft-tissue]
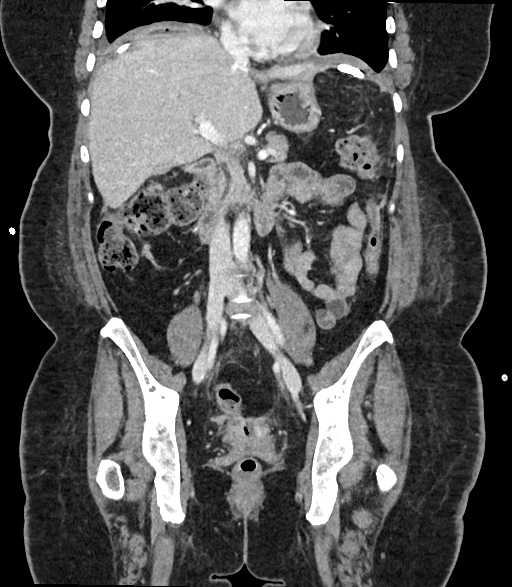

[12 of 46 positions shown; findings below may reference images not displayed]

RADIATION DOSE REDUCTION: This exam was performed according to the
departmental dose-optimization program which includes automated
exposure control, adjustment of the mA and/or kV according to
patient size and/or use of iterative reconstruction technique.

CONTRAST:  100mL H5QSWU-111 IOPAMIDOL (H5QSWU-111) INJECTION 61%
FINDINGS: Lower chest: Mild bibasilar atelectasis with trace pleural
effusions.

Hepatobiliary: No focal liver abnormality is seen. No gallstones,
gallbladder wall thickening, or biliary dilatation.

Pancreas: Unremarkable. No pancreatic ductal dilatation or
surrounding inflammatory changes.

Spleen: No significant abnormality.

Adrenals/Urinary Tract: Adrenal glands, kidneys, ureters are normal.
Evaluation of the bladder limited by under distension. Is grossly
unremarkable.

Stomach/Bowel: No bowel dilatation to indicate ileus or obstruction.
Excluded sigmoid colon and rectum seen distally. Left lower quadrant
colostomy is again seen. No significant bowel wall thickening.

Vascular/Lymphatic: Incidental note left common femoral and
profundus femoris vein DVT. No enlarged abdominal or pelvic lymph
nodes.

Reproductive: Postsurgical changes of hysterectomy are again seen.
Small amount of fluid in air present within the vaginal cuff.

Other: Small umbilical hernia containing fat, fluid, and air. Near
complete resolution perihepatic and perisplenic abscesses. Mild
subcutaneous emphysema in the left lateral abdominal wall has
improved since prior exam.

Musculoskeletal: No acute or significant osseous findings.
IMPRESSION: 1. Near complete resolution of bilateral subdiaphragmatic abscesses.
Drains in appropriate position.
2. Incidentally identified left common femoral and profundus femoris
acute DVT, new since CT from 12/01/2021.

These results were called by telephone at the time of interpretation
on 12/17/2021 at [DATE] to provider Dr. Marcelle Varon, who verbally
acknowledged these results.

## 2023-04-19 IMAGING — RF DG SINUS / FISTULA TRACT / ABSCESSOGRAM
3 series · 10 of 10 positions shown · non-contrast
Comparison: none

INDICATION: 55-year-old woman with history of multiple abdominal abscesses
resulting from bowel injury during hysterectomy.

[Series 1: one shot · 2 of 2 slices shown]
[im 1/2]
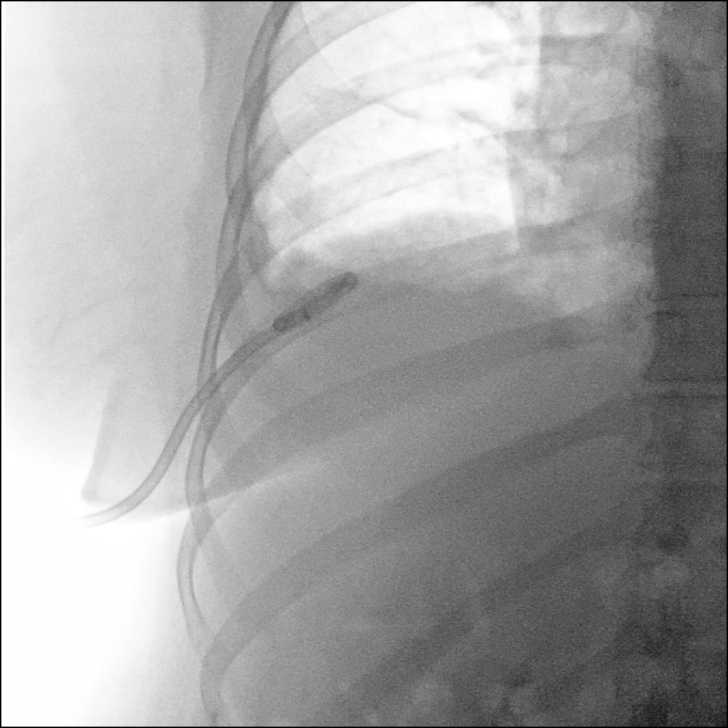
[im 2/2]
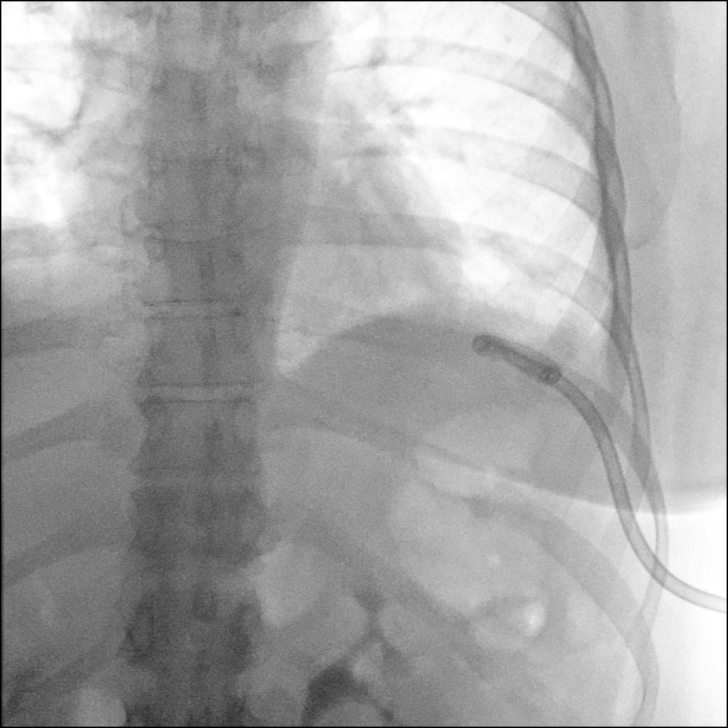

[Series 2: sequence · 4 of 81 frames shown (1 of 2)]
[frame 13/81]
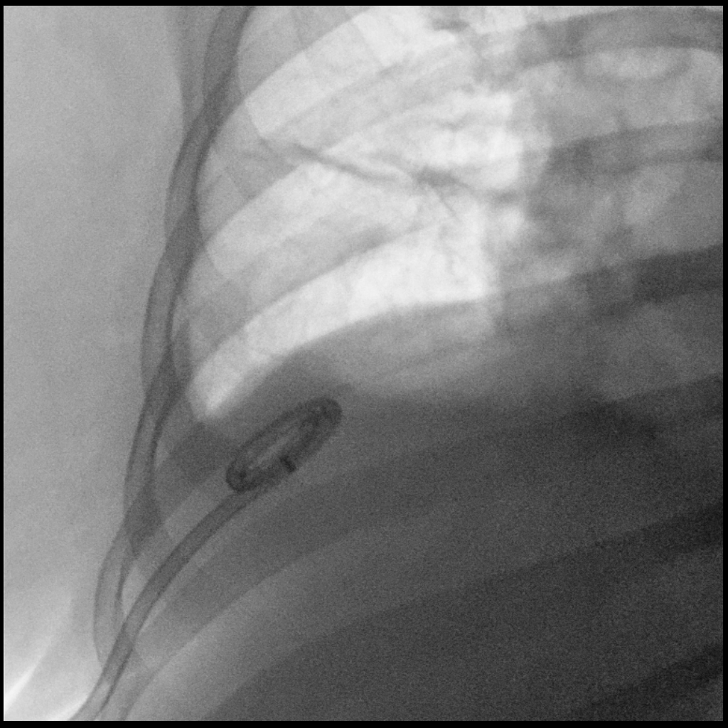
[frame 37/81]
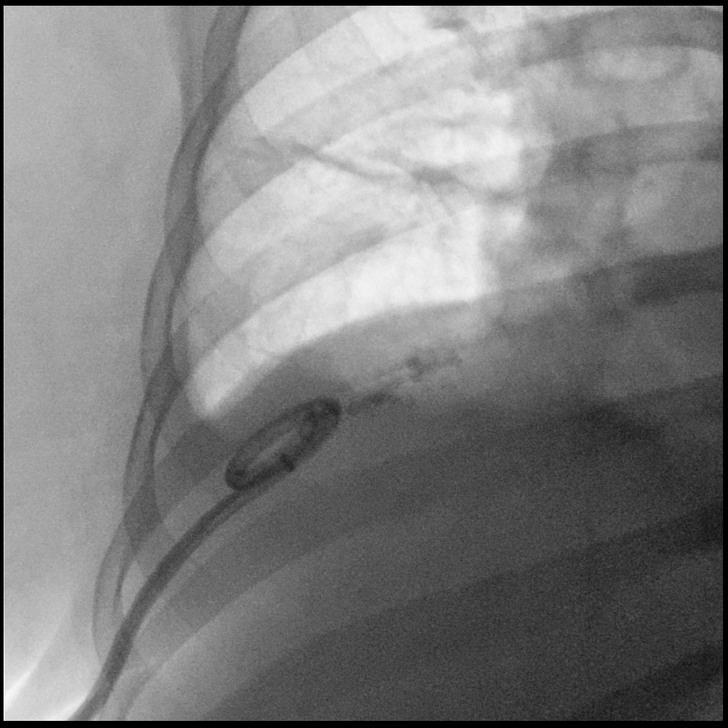
[frame 41/81]
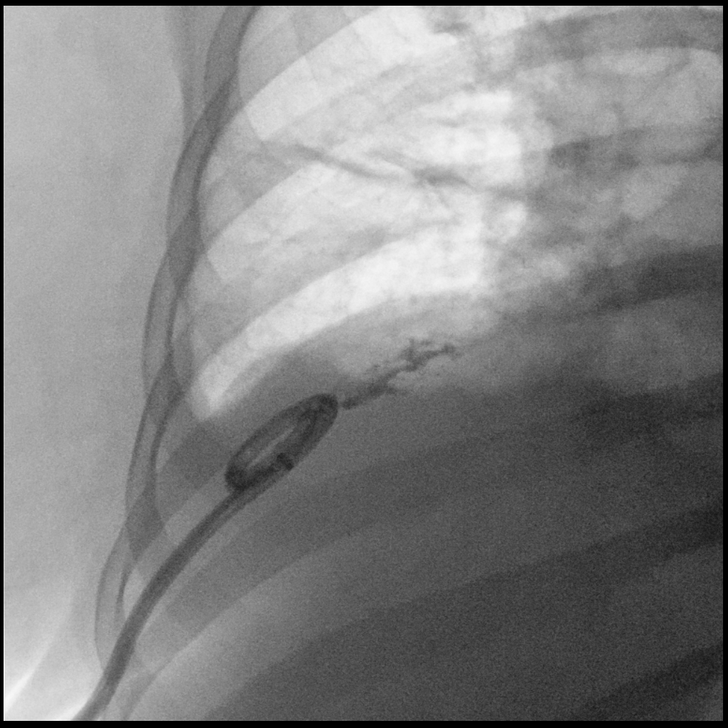
[frame 69/81]
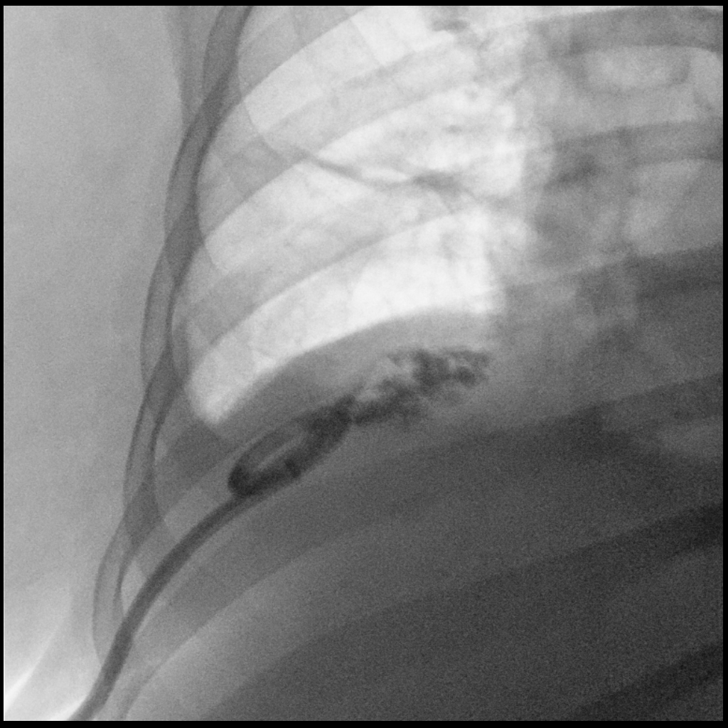

[Series 3: sequence · 4 of 44 frames shown (2 of 2)]
[frame 7/44]
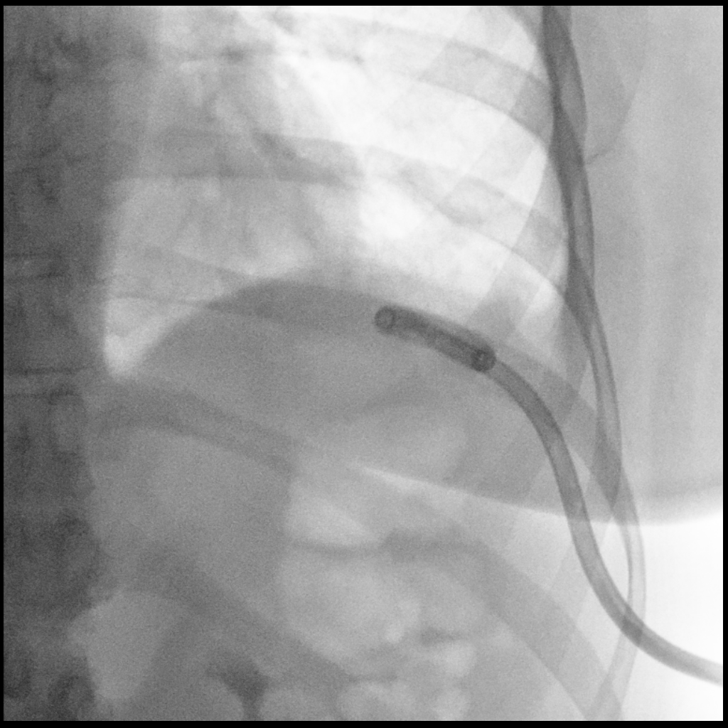
[frame 23/44]
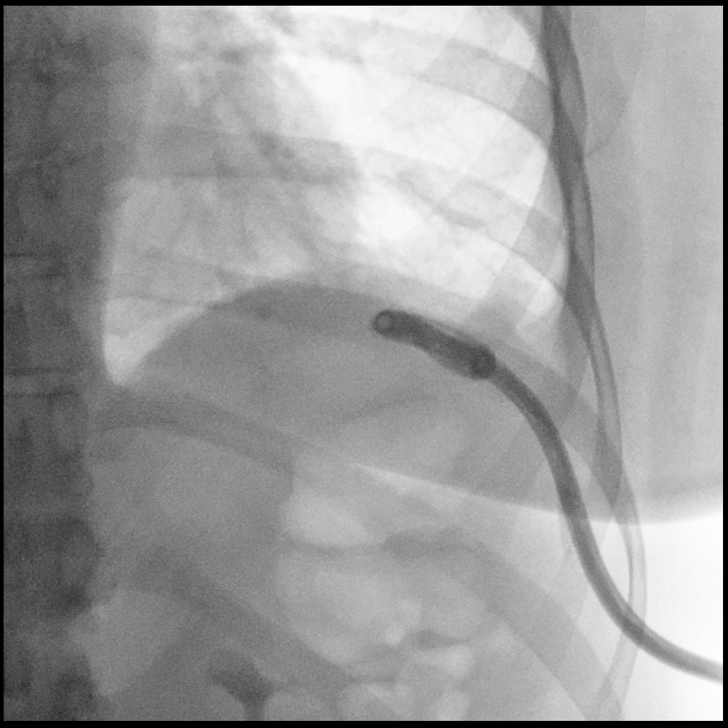
[frame 37/44]
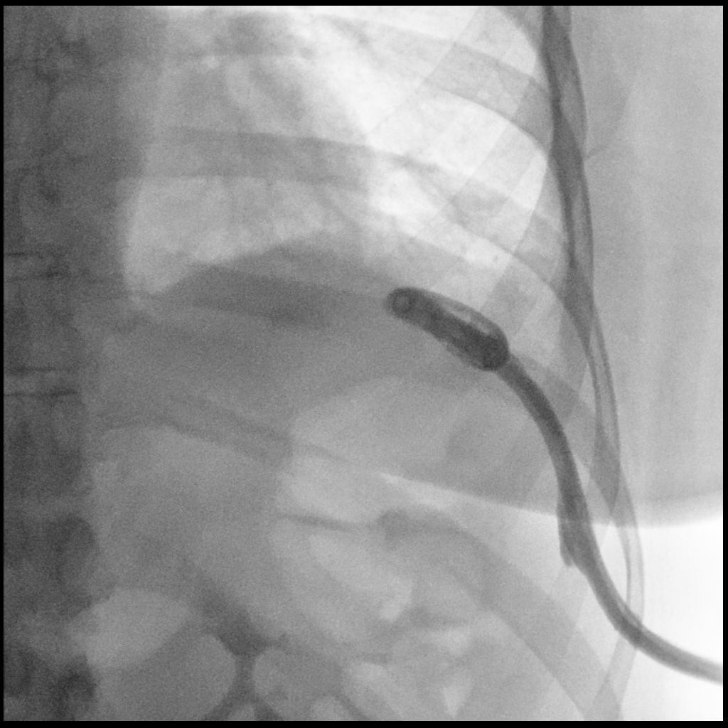
[frame 38/44]
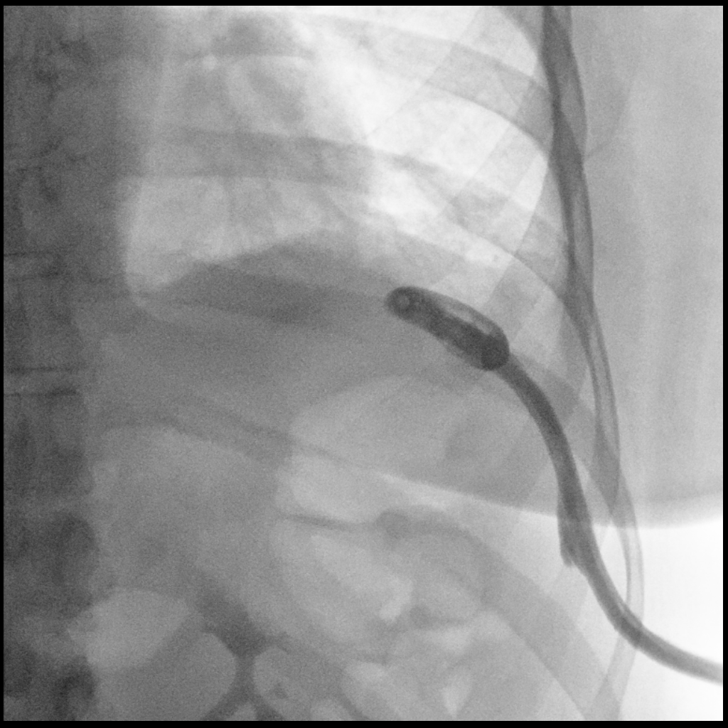

[10 of 10 positions shown; findings below may reference images not displayed]

Right upper quadrant subhepatic drain placed on 11/11/2021 and
removed on 12/02/2021.

Left upper quadrant drain placed on 11/23/2021.

Right upper quadrant subdiaphragmatic drain placed on 12/02/2021.

Patient reports minimal output from remaining bilateral upper
quadrant drains.

EXAM:
Bilateral upper abdominal abscessogram and drain removal.

MEDICATIONS:
None

ANESTHESIA/SEDATION:
None

COMPLICATIONS:
None immediate.

PROCEDURE:
Scout image demonstrates bilateral upper quadrant drains in
appropriate position.

Contrast administered through the right upper quadrant drain
demonstrates minimal residual cavity with no fistulous
communication. Given minimal output from this drain, it was removed.

Contrast administered through the left upper quadrant drain
demonstrates no significant residual cavity or fistulous
communication. Drain was cut and removed.
IMPRESSION: Bilateral upper quadrant abscessogram demonstrates little to no
residual cavities and no fistulous communication. Drains removed.

## 2023-04-27 ENCOUNTER — Encounter: Payer: Self-pay | Admitting: Nurse Practitioner

## 2023-05-18 ENCOUNTER — Ambulatory Visit: Payer: Self-pay | Admitting: Nurse Practitioner

## 2023-05-28 DIAGNOSIS — F902 Attention-deficit hyperactivity disorder, combined type: Secondary | ICD-10-CM | POA: Diagnosis not present

## 2023-05-28 DIAGNOSIS — Z79899 Other long term (current) drug therapy: Secondary | ICD-10-CM | POA: Diagnosis not present

## 2023-06-02 DIAGNOSIS — F902 Attention-deficit hyperactivity disorder, combined type: Secondary | ICD-10-CM | POA: Diagnosis not present

## 2023-06-08 ENCOUNTER — Other Ambulatory Visit (HOSPITAL_BASED_OUTPATIENT_CLINIC_OR_DEPARTMENT_OTHER): Payer: Self-pay | Admitting: Nurse Practitioner

## 2023-06-08 DIAGNOSIS — F339 Major depressive disorder, recurrent, unspecified: Secondary | ICD-10-CM

## 2023-06-08 NOTE — Telephone Encounter (Signed)
Waiting to fill on pts. Apt. Day 06/11/23

## 2023-06-10 ENCOUNTER — Encounter: Payer: BC Managed Care – PPO | Admitting: Nurse Practitioner

## 2023-06-11 ENCOUNTER — Encounter: Payer: BC Managed Care – PPO | Admitting: Nurse Practitioner

## 2023-06-11 ENCOUNTER — Encounter: Payer: Self-pay | Admitting: Nurse Practitioner

## 2023-06-11 ENCOUNTER — Ambulatory Visit (INDEPENDENT_AMBULATORY_CARE_PROVIDER_SITE_OTHER): Payer: BC Managed Care – PPO | Admitting: Nurse Practitioner

## 2023-06-11 VITALS — BP 128/84 | HR 98 | Ht 60.0 in | Wt 184.4 lb

## 2023-06-11 DIAGNOSIS — F339 Major depressive disorder, recurrent, unspecified: Secondary | ICD-10-CM | POA: Diagnosis not present

## 2023-06-11 DIAGNOSIS — F900 Attention-deficit hyperactivity disorder, predominantly inattentive type: Secondary | ICD-10-CM

## 2023-06-11 DIAGNOSIS — F410 Panic disorder [episodic paroxysmal anxiety] without agoraphobia: Secondary | ICD-10-CM

## 2023-06-11 DIAGNOSIS — N951 Menopausal and female climacteric states: Secondary | ICD-10-CM

## 2023-06-11 DIAGNOSIS — Z0000 Encounter for general adult medical examination without abnormal findings: Secondary | ICD-10-CM

## 2023-06-11 DIAGNOSIS — K439 Ventral hernia without obstruction or gangrene: Secondary | ICD-10-CM

## 2023-06-11 DIAGNOSIS — Z23 Encounter for immunization: Secondary | ICD-10-CM | POA: Diagnosis not present

## 2023-06-11 MED ORDER — CLONAZEPAM 1 MG PO TABS
0.5000 mg | ORAL_TABLET | Freq: Two times a day (BID) | ORAL | 2 refills | Status: AC | PRN
Start: 2023-06-11 — End: ?

## 2023-06-11 MED ORDER — CITALOPRAM HYDROBROMIDE 20 MG PO TABS: 20.0000 mg | ORAL_TABLET | Freq: Every day | ORAL | 3 refills | Status: DC

## 2023-06-11 NOTE — Patient Instructions (Signed)
WEIGHT LOSS PLANNING Your progress today shows:    For best management of weight, it is vital to balance intake versus output. This means the number of calories burned per day must be less than the calories you take in with food and drink.   I recommend trying to follow a diet with the following: Calories: 1200-1500 calories per day Carbohydrates: 150-180 grams of carbohydrates per day  Why: Gives your body enough "quick fuel" for cells to maintain normal function without sending them into starvation mode.  Protein: At least 90 grams of protein per day- 30 grams with each meal Why: Protein takes longer and uses more energy than carbohydrates to break down for fuel. The carbohydrates in your meals serves as quick energy sources and proteins help use some of that extra quick energy to break down to produce long term energy. This helps you not feel hungry as quickly and protein breakdown burns calories.  Water: Drink AT LEAST 64 ounces of water per day  Why: Water is essential to healthy metabolism. Water helps to fill the stomach and keep you fuller longer. Water is required for healthy digestion and filtering of waste in the body.  Fat: Limit fats in your diet- when choosing fats, choose foods with lower fats content such as lean meats (chicken, fish, [place]).  Why: Increased fat intake leads to storage "for later". Once you burn your carbohydrate energy, your body goes into fat and protein breakdown mode to help you loose weight.  Cholesterol: Fats and oils that are LIQUID at room temperature are best. Choose vegetable oils (olive oil, avocado oil, nuts). Avoid fats that are SOLID at room temperature (animal fats, processed meats). Healthy fats are often found in whole grains, beans, nuts, seeds, and berries.  Why: Elevated cholesterol levels lead to build up of cholesterol on the inside of your blood vessels. This will eventually cause the blood vessels to become hard and can lead to high blood  pressure and damage to your organs. When the blood flow is reduced, but the pressure is high from cholesterol buildup, parts of the cholesterol can break off and form clots that can go to the brain or heart leading to a stroke or heart attack.  Fiber: Increase amount of SOLUBLE the fiber in your diet. This helps to fill you up, lowers cholesterol, and helps with digestion. Some foods high in soluble fiber are oats, peas, beans, apples, carrots, barley, and citrus fruits.   Why: Fiber fills you up, helps remove excess cholesterol, and aids in healthy digestion which are all very important in weight management.   I recommend the following as a minimum activity routine: Purposeful walk or other physical activity at least 20 minutes every single day. This means purposefully taking a walk, jog, bike, swim, treadmill, elliptical, dance, etc.  This activity should be ABOVE your normal daily activities, such as walking at work. Goal exercise should be at least 150 minutes a week- work your way up to this.   Heart Rate: Your maximum exercise heart rate should be 220 - Your Age in Years. When exercising, get your heart rate up, but avoid going over the maximum targeted heart rate.  60-70% of your maximum heart rate is where you tend to burn the most fat. To find this number:  220 - Age In Years= Max HR  Max HR x 0.6 (or 0.7) = Fat Burning HR The Fat Burning HR is your goal heart rate while working out to burn the  most fat.  NEVER exercise to the point your feel lightheaded, weak, nauseated, dizzy. If you experience ANY of these symptoms- STOP exercise! Allow yourself to cool down and your heart rate to come down. Then restart slower next time.  If at ANY TIME you feel chest pain or chest pressure during exercise, STOP IMMEDIATELY and seek medical attention.

## 2023-06-11 NOTE — Assessment & Plan Note (Signed)

## 2023-06-11 NOTE — Assessment & Plan Note (Addendum)
Chronic. At this time she is struggling with a new form of normal and continued recovery. I do feel there is a component of PTSD present that may be improved with counseling and CBT. I have encouraged her to continue to seek this. We discussed the progress she has made and the importance of routine and setting goals every day to help with her motivation. She plans to set a schedule daily and work to get out of the house to spend time with friends or volunteer regularly. No alarm symptoms are present at this time.

## 2023-06-11 NOTE — Assessment & Plan Note (Signed)
Currently managed with [place] [facility]. Doing well with her current regimen. Will monitor.

## 2023-06-11 NOTE — Progress Notes (Signed)
Michele Clamp, DNP, AGNP-c [facility] [address] [place], [place] [address] Main Office [phone]  BP 128/84   Pulse 98   Ht 5' (1.524 m)   Wt 184 lb 6.4 oz (83.6 kg)   LMP 10/07/2021 (Approximate)   BMI 36.01 kg/m    Subjective:    Patient ID: Michele [name], female    DOB: [birth year], 57 y.o.   MRN: [id number]  HPI: Michele [name] is a 57 y.o. female presenting on 06/11/2023 for comprehensive medical examination.   Michele [name] is seeing Dr. Elisabeth Most with [facility]. She is planning to start seeing one of the therapists in that office/building in the near future.   Michele [name] reports her mood is up and down with most days feeling like she does not have the energy or strength to get out of bed. She reports that once she gets up and goes out she feels so much better and has increased energy and a better mood, but the initial motivation is very difficult for her.   She reports crying spells at times that are not over any particular reason. She also reports significant fear of being alone and expresses concerns when her daughter leaves for college next fall.   She has a good support symptoms with friends and enjoys volunteering with animals.  She discusses her recent surgeries including the takedown of her colostomy and hernia repair surgery. She does still have a little pain in the abdomen from this, but finds that wearing her abdominal binder helps.   She reports no strength in her abdominal muscles and pain in her right shoulder from using this to help move herself up and down, particularly in bed. She is open to physical therapy to help strengthen these areas.   Pertinent items are noted in HPI.  IMMUNIZATIONS:   Flu Vaccine: Flu vaccine declined, patient will complete later Prevnar 13: Prevnar 13 N/A for this patient Prevnar 20: Prevnar 20 N/A for this patient Pneumovax 23: Pneumovax 23 N/A for this patient Vac Shingrix: Shingrix  both doses completed, documentation needed HPV: N/A or Aged Out Tetanus: Tetanus completed in the last 10 years COVID: Declined today. Information on where to obtain the vaccine was provided.  RSV: No  HEALTH MAINTENANCE: Pap Smear no longer required Mammogram HM Status: is up to date Colon Cancer Screening HM Status: is up to date Bone Density HM Status: N/A STI Testing HM Status: N/A Lung CT HM Status: N/A  Concerns with vision, hearing, or dentition: No  Most Recent Depression Screen:     06/11/2023    3:06 PM 01/23/2022    4:42 PM 01/17/2022    8:27 AM  Depression screen PHQ 2/9  Decreased Interest 3 0 3  Down, Depressed, Hopeless 3 0 3  PHQ - 2 Score 6 0 6  Altered sleeping 3 0 2  Tired, decreased energy 3 0 1  Change in appetite 3 0 3  Feeling bad or failure about yourself  0 0 2  Trouble concentrating 0 0 0  Moving slowly or fidgety/restless 0 0 0  Suicidal thoughts 0 0 0  PHQ-9 Score 15 0 14  Difficult doing work/chores Not difficult at all Not difficult at all Extremely dIfficult   Most Recent Anxiety Screen:      No data to display         Most Recent Fall Screen:    06/11/2023    3:06 PM 01/17/2022    8:27 AM  Fall Risk  Falls in the past year? 0 0  Number falls in past yr: 0 0  Injury with Fall? 0 0  Risk for fall due to : No Fall Risks No Fall Risks  Follow up Falls evaluation completed Falls evaluation completed;Education provided    Past medical history, surgical history, medications, allergies, family history and social history reviewed with patient today and changes made to appropriate areas of the chart.  Past Medical History:  Past Medical History:  Diagnosis Date   Acute deep vein thrombosis (DVT) of femoral vein (HCC) 01/21/2022   Allergy    Anemia    Anxiety    Delayed perforation of colon s/p [facility] colectomy/ostomy March [address] 11/05/2021   Delayed perforation of colon s/p [facility] colectomy/ostomy March 2023 11/05/2021    Depression    Hyperlipidemia    Intra-abdominal abscess (HCC) 12/01/2021   Pre-diabetes    S/P TAH (total abdominal hysterectomy) 10/25/2021   Medications:  Current Outpatient Medications on File Prior to Visit  Medication Sig   amphetamine-dextroamphetamine (ADDERALL XR) 30 MG 24 hr capsule Take 1 capsule (30 mg total) by mouth daily. (Patient taking differently: Take 15 mg by mouth daily.)   fluticasone (FLONASE) 50 MCG/ACT nasal spray Place 1 spray into both nostrils daily.   melatonin 5 MG TABS Take 5 mg by mouth at bedtime as needed (sleep).   Pediatric Multivitamins-Iron (FLINTSTONES PLUS EXTRA IRON PO) Take 1 tablet by mouth daily.   polyethylene glycol (MIRALAX / GLYCOLAX) 17 g packet Take 17 g by mouth 2 (two) times daily as needed. (Patient taking differently: Take 17-34 g by mouth every evening.)   rosuvastatin (CRESTOR) 5 MG tablet Take 1 tablet (5 mg total) by mouth daily.   VITAMIN D, CHOLECALCIFEROL, PO Take 1 capsule by mouth 3 (three) times a week. (Patient not taking: Reported on 06/11/2023)   No current facility-administered medications on file prior to visit.   Surgical History:  Past Surgical History:  Procedure Laterality Date   CESAREAN SECTION     TWO of them   COLOSTOMY Left 11/05/2021   Procedure: COLOSTOMY;  Surgeon: Essie Hart, MD;  Location: [facility];  Service: Gynecology;  Laterality: Left;   CYSTOSCOPY  10/25/2021   Procedure: CYSTOSCOPY;  Surgeon: Essie Hart, MD;  Location: [facility];  Service: Gynecology;;   dental implants N/A 2016   lower 2 teeth   HYSTERECTOMY ABDOMINAL WITH SALPINGECTOMY  10/25/2021   Procedure: HYSTERECTOMY ABDOMINAL WITH BILATERAL SALPINGECTOMY;  Surgeon: Essie Hart, MD;  Location: [facility];  Service: Gynecology;;   HYSTEROSCOPY WITH D & C  01/30/2017   Procedure: DILATATION AND CURETTAGE /HYSTEROSCOPY;  Surgeon: Essie Hart, MD;  Location: [facility];  Service: Gynecology;;   INCISIONAL HERNIA REPAIR N/A 12/04/2022   Procedure: OPEN  INCISIONAL HERNIA REPAIR WITH MESH AND ABDOMINAL RECONSTRUCTION AND COMPONENT SEPARATION;  Surgeon: Karie Soda, MD;  Location: [facility];  Service: General;  Laterality: N/A;  GEN w/[facility] PATHWAY LOCAL   IR CATHETER TUBE CHANGE  12/02/2021   IR GUIDED DRAIN W CATHETER PLACEMENT  12/02/2021   IR RADIOLOGIST EVAL & MGMT  12/17/2021   IR SINUS/FIST TUBE CHK-NON GI  12/02/2021   LAPAROSCOPIC LYSIS OF ADHESIONS N/A 07/09/2022   Procedure: ROBOTIC AND LAP LYSIS OF ADHESIONS, BILATERAL TAP BLOCK;  Surgeon: Karie Soda, MD;  Location: [facility];  Service: General;  Laterality: N/A;   LAPAROSCOPY  10/25/2021   Procedure: LAPAROSCOPY DIAGNOSTIC;  Surgeon: Essie Hart, MD;  Location: [facility];  Service: Gynecology;;  LAPAROTOMY N/A 11/05/2021   Procedure: EXPLORATORY LAPAROTOMY AND COLECTOMY ;  Surgeon: Essie Hart, MD;  Location: [facility];  Service: Gynecology;  Laterality: N/A;   LAPAROTOMY N/A 11/14/2021   Procedure: EXPLORATORY  LAPAROTOMY AND ABDOMINAL WALL CLOSURE;  Surgeon: Harriette Bouillon, MD;  Location: [facility];  Service: General;  Laterality: N/A;   PROCTOSCOPY N/A 07/09/2022   Procedure: RIGID PROCTOSCOPY;  Surgeon: Karie Soda, MD;  Location: [facility];  Service: General;  Laterality: N/A;   XI ROBOTIC ASSISTED COLOSTOMY TAKEDOWN N/A 07/09/2022   Procedure: ROBOTIC OSTOMY TAKEDOWN;  Surgeon: Karie Soda, MD;  Location: [facility];  Service: General;  Laterality: N/A;  GEN w/ [facility] PATHWAY LOCAL   Allergies:  Allergies  Allergen Reactions   Atorvastatin Rash and Other (See Comments)    Muscle aches on Lipitor 20 mg and 40 mg     Sulfa Antibiotics Nausea And Vomiting, Other (See Comments) and Rash   Family History:  Family History  Problem Relation Age of Onset   Colon polyps Mother    Heart attack Father    Hypertension Sister    Colon cancer Neg Hx    Esophageal cancer Neg Hx    Rectal cancer Neg Hx    Stomach cancer Neg Hx        Objective:    BP 128/84   Pulse 98   Ht 5' (1.524 m)   Wt 184  lb 6.4 oz (83.6 kg)   LMP 10/07/2021 (Approximate)   BMI 36.01 kg/m   Wt Readings from Last 3 Encounters:  06/11/23 184 lb 6.4 oz (83.6 kg)  12/04/22 167 lb 8.8 oz (76 kg)  11/27/22 168 lb (76.2 kg)    Physical Exam Vitals and nursing note reviewed.  Constitutional:      General: She is not in acute distress.    Appearance: Normal appearance. She is not ill-appearing.  HENT:     Head: Normocephalic.     Right Ear: Tympanic membrane and external ear normal.     Left Ear: Tympanic membrane and external ear normal.     Nose: Nose normal.     Mouth/Throat:     Mouth: Mucous membranes are moist.     Pharynx: Oropharynx is clear.  Eyes:     Conjunctiva/sclera: Conjunctivae normal.     Pupils: Pupils are equal, round, and reactive to light.  Neck:     Vascular: No carotid bruit.  Cardiovascular:     Rate and Rhythm: Normal rate and regular rhythm.     Pulses: Normal pulses.     Heart sounds: Normal heart sounds.  Pulmonary:     Effort: Pulmonary effort is normal.     Breath sounds: Normal breath sounds.  Abdominal:     General: Bowel sounds are normal. There is no distension.     Tenderness: There is abdominal tenderness. There is no right CVA tenderness, left CVA tenderness or guarding.     Comments: Large surgical incision from sternum to pubic symphysis with generalized tenderness present throughout all 4 quadrants.   Musculoskeletal:        General: Normal range of motion.     Cervical back: Normal range of motion. No tenderness.     Right lower leg: No edema.     Left lower leg: No edema.  Lymphadenopathy:     Cervical: No cervical adenopathy.  Skin:    General: Skin is warm and dry.     Capillary Refill: Capillary refill takes less than 2  seconds.  Neurological:     General: No focal deficit present.     Mental Status: She is alert and oriented to person, place, and time.     Sensory: No sensory deficit.     Motor: No weakness.     Coordination: Coordination  normal.     Gait: Gait normal.  Psychiatric:        Behavior: Behavior normal.      Results for orders placed or performed during the hospital encounter of 12/04/22  Glucose, capillary  Result Value Ref Range   Glucose-Capillary 173 (H) 70 - 99 mg/dL  CBC  Result Value Ref Range   WBC 10.0 4.0 - 10.5 K/uL   RBC 3.47 (L) 3.87 - 5.11 MIL/uL   Hemoglobin 9.1 (L) 12.0 - 15.0 g/dL   HCT 40.9 (L) 81.1 - 91.4 %   MCV 83.3 80.0 - 100.0 fL   MCH 26.2 26.0 - 34.0 pg   MCHC 31.5 30.0 - 36.0 g/dL   RDW 78.2 95.6 - 21.3 %   Platelets 244 150 - 400 K/uL   nRBC 0.0 0.0 - 0.2 %  Basic metabolic panel  Result Value Ref Range   Sodium 133 (L) 135 - 145 mmol/L   Potassium 4.3 3.5 - 5.1 mmol/L   Chloride 100 98 - 111 mmol/L   CO2 25 22 - 32 mmol/L   Glucose, Bld 170 (H) 70 - 99 mg/dL   BUN 14 6 - 20 mg/dL   Creatinine, Ser 0.86 0.44 - 1.00 mg/dL   Calcium 7.9 (L) 8.9 - 10.3 mg/dL   GFR, Estimated >57 >84 mL/min   Anion gap 8 5 - 15  Magnesium  Result Value Ref Range   Magnesium 2.0 1.7 - 2.4 mg/dL  CBC  Result Value Ref Range   WBC 7.8 4.0 - 10.5 K/uL   RBC 2.68 (L) 3.87 - 5.11 MIL/uL   Hemoglobin 7.0 (L) 12.0 - 15.0 g/dL   HCT 69.6 (L) 29.5 - 28.4 %   MCV 84.0 80.0 - 100.0 fL   MCH 26.1 26.0 - 34.0 pg   MCHC 31.1 30.0 - 36.0 g/dL   RDW 13.2 44.0 - 10.2 %   Platelets 259 150 - 400 K/uL   nRBC 0.0 0.0 - 0.2 %  CBC  Result Value Ref Range   WBC 8.0 4.0 - 10.5 K/uL   RBC 2.85 (L) 3.87 - 5.11 MIL/uL   Hemoglobin 7.5 (L) 12.0 - 15.0 g/dL   HCT 72.5 (L) 36.6 - 44.0 %   MCV 84.2 80.0 - 100.0 fL   MCH 26.3 26.0 - 34.0 pg   MCHC 31.3 30.0 - 36.0 g/dL   RDW 34.7 42.5 - 95.6 %   Platelets 307 150 - 400 K/uL   nRBC 0.0 0.0 - 0.2 %  CBC  Result Value Ref Range   WBC 6.0 4.0 - 10.5 K/uL   RBC 2.56 (L) 3.87 - 5.11 MIL/uL   Hemoglobin 6.7 (LL) 12.0 - 15.0 g/dL   HCT 38.7 (L) 56.4 - 33.2 %   MCV 83.6 80.0 - 100.0 fL   MCH 26.2 26.0 - 34.0 pg   MCHC 31.3 30.0 - 36.0 g/dL   RDW 95.1  88.4 - 16.6 %   Platelets 274 150 - 400 K/uL   nRBC 0.0 0.0 - 0.2 %  Hemoglobin  Result Value Ref Range   Hemoglobin 8.0 (L) 12.0 - 15.0 g/dL  Creatinine, serum  Result Value Ref Range   Creatinine,  Ser 0.70 0.44 - 1.00 mg/dL   GFR, Estimated >14 >78 mL/min  Potassium  Result Value Ref Range   Potassium 3.6 3.5 - 5.1 mmol/L  Type and screen [facility]  Result Value Ref Range   ABO/RH(D) A NEG    Antibody Screen NEG    Sample Expiration 12/11/2022,2359    Unit Number [id number]    Blood Component Type RBC, LR IRR    Unit division 00    Status of Unit ISSUED,FINAL    Transfusion Status OK TO TRANSFUSE    Crossmatch Result      Compatible Performed at [facility], [address], [place], [place] [address]   Prepare RBC (crossmatch)  Result Value Ref Range   Order Confirmation      ORDER PROCESSED BY BLOOD BANK Performed at [facility], [address] [place], [place] [address]   BPAM [facility]  Result Value Ref Range   ISSUE DATE / TIME [id number]    Blood Product Unit Number [id number]    PRODUCT CODE [id number]    Unit Type and Rh 0600    Blood Product Expiration Date [id number]        Assessment & Plan:   Problem List Items Addressed This Visit     Attention deficit hyperactivity disorder (ADHD), predominantly inattentive type    Currently managed with [facility]. Doing well with her current regimen. Will monitor.       Depression, recurrent (HCC)    Chronic. At this time she is struggling with a new form of normal and continued recovery. I do feel there is a component of PTSD present that may be improved with counseling and CBT. I have encouraged her to continue to seek this. We discussed the progress she has made and the importance of routine and setting goals every day to help with her motivation. She plans to set a schedule daily and work to get out of the house to spend time  with friends or volunteer regularly. No alarm symptoms are present at this time.       Relevant Medications   citalopram (CELEXA) 20 MG tablet   Encounter for annual physical exam - Primary    CPE completed today. Review of HM activities and recommendations discussed and provided on AVS. Anticipatory guidance, diet, and exercise recommendations provided. Medications, allergies, and hx reviewed and updated as necessary. Orders placed as listed below.  Plan: - Labs ordered. Will make changes as necessary based on results.  - I will review these results and send recommendations via MyChart or a telephone call.  - F/U with CPE in 1 year or sooner for acute/chronic health needs as directed.        Relevant Orders   CBC with Differential/Platelet   CMP14+EGFR   Hemoglobin A1c   Lipid panel   TSH   Iron, TIBC and Ferritin Panel   Vitamin D, 25-hydroxy   Estradiol   FSH/LH   Testosterone Free MS/Dialysis   Panic disorder   Relevant Medications   citalopram (CELEXA) 20 MG tablet   clonazePAM (KLONOPIN) 1 MG tablet   Other Visit Diagnoses     Need for influenza vaccination       Relevant Orders   Flu vaccine trivalent PF, 6mos and older(Flulaval,Afluria,Fluarix,Fluzone) (Completed)   Menopause syndrome       Relevant Orders   CBC with Differential/Platelet   CMP14+EGFR   Hemoglobin A1c   Lipid panel   TSH  Iron, TIBC and Ferritin Panel   Vitamin D, 25-hydroxy   Estradiol   FSH/LH   Testosterone Free MS/Dialysis   Abdominal muscle defects       Relevant Orders   Ambulatory referral to Physical Therapy         Follow up plan: Return in about 1 year (around 06/10/2024) for CPE.  NEXT PREVENTATIVE PHYSICAL DUE IN 1 YEAR.  PATIENT COUNSELING PROVIDED FOR ALL ADULT PATIENTS: A well balanced diet low in saturated fats, cholesterol, and moderation in carbohydrates.  This can be as simple as monitoring portion sizes and cutting back on sugary beverages such as soda and  juice to start with.    Daily water consumption of at least 64 ounces.  Physical activity at least 180 minutes per week.  If just starting out, start 10 minutes a day and work your way up.   This can be as simple as taking the stairs instead of the elevator and walking 2-3 laps around the office  purposefully every day.   STD protection, partner selection, and regular testing if high risk.  Limited consumption of alcoholic beverages if alcohol is consumed. For men, I recommend no more than 14 alcoholic beverages per week, spread out throughout the week (max 2 per day). Avoid "binge" drinking or consuming large quantities of alcohol in one setting.  Please let me know if you feel you may need help with reduction or quitting alcohol consumption.   Avoidance of nicotine, if used. Please let me know if you feel you may need help with reduction or quitting nicotine use.   Daily mental health attention. This can be in the form of 5 minute daily meditation, prayer, journaling, yoga, reflection, etc.  Purposeful attention to your emotions and mental state can significantly improve your overall wellbeing  and  Health.  Please know that I am here to help you with all of your health care goals and am happy to work with you to find a solution that works best for you.  The greatest advice I have received with any changes in life are to take it one step at a time, that even means if all you can focus on is the next 60 seconds, then do that and celebrate your victories.  With any changes in life, you will have set backs, and that is OK. The important thing to remember is, if you have a set back, it is not a failure, it is an opportunity to try again! Screening Testing Mammogram Every 1 -2 years based on history and risk factors Starting at age 72 Pap Smear Ages 21-39 every 3 years Ages 47-65 every 5 years with HPV testing More frequent testing may be required based on results and history Colon  Cancer Screening Every 1-10 years based on test performed, risk factors, and history Starting at age 51 Bone Density Screening Every 2-10 years based on history Starting at age 35 for women Recommendations for men differ based on medication usage, history, and risk factors AAA Screening One time ultrasound Men 82-22 years old who have every smoked Lung Cancer Screening Low Dose Lung CT every 12 months Age 52-80 years with a 30 pack-year smoking history who still smoke or who have quit within the last 15 years   Screening Labs Routine  Labs: Complete Blood Count (CBC), Complete Metabolic Panel (CMP), Cholesterol (Lipid Panel) Every 6-12 months based on history and medications May be recommended more frequently based on current conditions or previous results Hemoglobin  A1c Lab Every 3-12 months based on history and previous results Starting at age 82 or earlier with diagnosis of diabetes, high cholesterol, BMI >26, and/or risk factors Frequent monitoring for patients with diabetes to ensure blood sugar control Thyroid Panel (TSH) Every 6 months based on history, symptoms, and risk factors May be repeated more often if on medication HIV One time testing for all patients 52 and older May be repeated more frequently for patients with increased risk factors or exposure Hepatitis C One time testing for all patients 31 and older May be repeated more frequently for patients with increased risk factors or exposure Gonorrhea, Chlamydia Every 12 months for all sexually active persons 13-24 years Additional monitoring may be recommended for those who are considered high risk or who have symptoms Every 12 months for any woman on birth control, regardless of sexual activity PSA Men 54-74 years old with risk factors Additional screening may be recommended from age 63-69 based on risk factors, symptoms, and history  Vaccine Recommendations Tetanus Booster All adults every 10 years Flu  Vaccine All patients 6 months and older every year COVID Vaccine All patients 12 years and older Initial dosing with booster May recommend additional booster based on age and health history HPV Vaccine 2 doses all patients age 20-26 Dosing may be considered for patients over 26 Shingles Vaccine (Shingrix) 2 doses all adults 55 years and older Pneumonia (Pneumovax 69) All adults 65 years and older May recommend earlier dosing based on health history One year apart from Prevnar 47 Pneumonia (Prevnar 94) All adults 65 years and older Dosed 1 year after Pneumovax 23 Pneumonia (Prevnar 20) One time alternative to the two dosing of 13 and 23 For all adults with initial dose of 23, 20 is recommended 1 year later For all adults with initial dose of 13, 23 is still recommended as second option 1 year later

## 2023-06-12 LAB — CMP14+EGFR
ALT: 16 [IU]/L (ref 0–32)
AST: 19 [IU]/L (ref 0–40)
Albumin: 4.5 g/dL (ref 3.8–4.9)
Alkaline Phosphatase: 109 [IU]/L (ref 44–121)
BUN/Creatinine Ratio: 22 (ref 9–23)
BUN: 21 mg/dL (ref 6–24)
Bilirubin Total: 0.3 mg/dL (ref 0.0–1.2)
CO2: 25 mmol/L (ref 20–29)
Calcium: 10.4 mg/dL — ABNORMAL HIGH (ref 8.7–10.2)
Chloride: 103 mmol/L (ref 96–106)
Creatinine, Ser: 0.95 mg/dL (ref 0.57–1.00)
Globulin, Total: 3 g/dL (ref 1.5–4.5)
Glucose: 75 mg/dL (ref 70–99)
Potassium: 5.1 mmol/L (ref 3.5–5.2)
Sodium: 140 mmol/L (ref 134–144)
Total Protein: 7.5 g/dL (ref 6.0–8.5)
eGFR: 70 mL/min/{1.73_m2} (ref >59)

## 2023-06-12 LAB — CBC WITH DIFFERENTIAL/PLATELET
Basophils Absolute: 0 10*3/uL (ref 0.0–0.2)
Basos: 1 %
EOS (ABSOLUTE): 0.1 10*3/uL (ref 0.0–0.4)
Eos: 2 %
Hematocrit: 43 % (ref 34.0–46.6)
Hemoglobin: 13 g/dL (ref 11.1–15.9)
Immature Grans (Abs): 0 10*3/uL (ref 0.0–0.1)
Immature Granulocytes: 0 %
Lymphocytes Absolute: 1.6 10*3/uL (ref 0.7–3.1)
Lymphs: 33 %
MCH: 23.5 pg — ABNORMAL LOW (ref 26.6–33.0)
MCHC: 30.2 g/dL — ABNORMAL LOW (ref 31.5–35.7)
MCV: 78 fL — ABNORMAL LOW (ref 79–97)
Monocytes Absolute: 0.5 10*3/uL (ref 0.1–0.9)
Monocytes: 10 %
Neutrophils Absolute: 2.6 10*3/uL (ref 1.4–7.0)
Neutrophils: 54 %
Platelets: 362 10*3/uL (ref 150–450)
RBC: 5.53 x10E6/uL — ABNORMAL HIGH (ref 3.77–5.28)
RDW: 17.5 % — ABNORMAL HIGH (ref 11.7–15.4)
WBC: 4.8 10*3/uL (ref 3.4–10.8)

## 2023-06-12 LAB — LIPID PANEL
Chol/HDL Ratio: 3.2 ratio (ref 0.0–4.4)
Cholesterol, Total: 268 mg/dL — ABNORMAL HIGH (ref 100–199)
HDL: 85 mg/dL (ref >39)
LDL Chol Calc (NIH): 169 mg/dL — ABNORMAL HIGH (ref 0–99)
Triglycerides: 86 mg/dL (ref 0–149)
VLDL Cholesterol Cal: 14 mg/dL (ref 5–40)

## 2023-06-12 LAB — TSH: TSH: 1.89 u[IU]/mL (ref 0.450–4.500)

## 2023-06-12 LAB — IRON,TIBC AND FERRITIN PANEL
Ferritin: 23 ng/mL (ref 15–150)
Iron Saturation: 14 % — ABNORMAL LOW (ref 15–55)
Iron: 58 ug/dL (ref 27–159)
Total Iron Binding Capacity: 417 ug/dL (ref 250–450)
UIBC: 359 ug/dL (ref 131–425)

## 2023-06-12 LAB — FSH/LH
FSH: 113 m[IU]/mL
LH: 107 m[IU]/mL

## 2023-06-12 LAB — HEMOGLOBIN A1C
Est. average glucose Bld gHb Est-mCnc: 140 mg/dL
Hgb A1c MFr Bld: 6.5 % — ABNORMAL HIGH (ref 4.8–5.6)

## 2023-06-12 LAB — ESTRADIOL: Estradiol: 9.4 pg/mL

## 2023-06-12 LAB — VITAMIN D 25 HYDROXY (VIT D DEFICIENCY, FRACTURES): Vit D, 25-Hydroxy: 31.5 ng/mL (ref 30.0–100.0)

## 2023-06-24 ENCOUNTER — Telehealth: Payer: Self-pay

## 2023-06-24 NOTE — Telephone Encounter (Signed)
Pt states she is interested in the estrogen patches as a hormone replacement after her hysterectomy. States her insurance covers it.   Pt is also interested in starting a low dose of metformin. Would like to try this with weight loss to see if she can see some improvement. Is concerned about being on the medication for the rest of her life.

## 2023-06-25 ENCOUNTER — Other Ambulatory Visit: Payer: Self-pay

## 2023-06-25 MED ORDER — METFORMIN HCL ER 500 MG PO TB24: 500.0000 mg | ORAL_TABLET | Freq: Every day | ORAL | 1 refills | Status: DC

## 2023-06-26 ENCOUNTER — Other Ambulatory Visit: Payer: Self-pay | Admitting: Nurse Practitioner

## 2023-06-26 DIAGNOSIS — E2839 Other primary ovarian failure: Secondary | ICD-10-CM

## 2023-06-26 MED ORDER — ESTRADIOL 0.025 MG/24HR TD PTWK
0.0250 mg | MEDICATED_PATCH | TRANSDERMAL | 12 refills | Status: DC
Start: 2023-06-26 — End: 2024-03-17

## 2023-11-26 DIAGNOSIS — Z79899 Other long term (current) drug therapy: Secondary | ICD-10-CM | POA: Diagnosis not present

## 2023-11-26 DIAGNOSIS — F902 Attention-deficit hyperactivity disorder, combined type: Secondary | ICD-10-CM | POA: Diagnosis not present

## 2023-11-30 DIAGNOSIS — F902 Attention-deficit hyperactivity disorder, combined type: Secondary | ICD-10-CM | POA: Diagnosis not present

## 2023-11-30 DIAGNOSIS — Z79899 Other long term (current) drug therapy: Secondary | ICD-10-CM | POA: Diagnosis not present

## 2023-12-22 ENCOUNTER — Other Ambulatory Visit: Payer: Self-pay | Admitting: Nurse Practitioner

## 2024-01-29 ENCOUNTER — Other Ambulatory Visit: Payer: Self-pay

## 2024-01-29 ENCOUNTER — Telehealth: Payer: Self-pay

## 2024-01-29 DIAGNOSIS — N951 Menopausal and female climacteric states: Secondary | ICD-10-CM

## 2024-01-29 DIAGNOSIS — E7800 Pure hypercholesterolemia, unspecified: Secondary | ICD-10-CM

## 2024-01-29 DIAGNOSIS — E119 Type 2 diabetes mellitus without complications: Secondary | ICD-10-CM

## 2024-01-29 DIAGNOSIS — E785 Hyperlipidemia, unspecified: Secondary | ICD-10-CM

## 2024-01-29 DIAGNOSIS — E559 Vitamin D deficiency, unspecified: Secondary | ICD-10-CM

## 2024-01-29 DIAGNOSIS — E2839 Other primary ovarian failure: Secondary | ICD-10-CM

## 2024-01-29 DIAGNOSIS — E669 Obesity, unspecified: Secondary | ICD-10-CM

## 2024-01-29 NOTE — Telephone Encounter (Signed)
 Sent pt. MyChart message explaining she already has Metformin  at the pharmacy and I have put in orders for lab work.   Copied from CRM [id number]. Topic: Clinical - Request for Lab/Test Order >> Jan 28, 2024  4:17 PM Zipporah Him wrote: Reason for CRM: Requesting lab orders to be placed for A1C, Lipids, Iron  Levels. States complete labs are fine even if she has to do a partial pay. She wants to have everything checked. Please call if orders are placed, she also states she needs them to get refills on her metformin . She scheduled an appointment for follow up 8/18 and got on the wait list for earlier appointments.

## 2024-03-01 ENCOUNTER — Encounter: Payer: Self-pay | Admitting: Nurse Practitioner

## 2024-03-02 ENCOUNTER — Other Ambulatory Visit: Payer: Self-pay

## 2024-03-02 DIAGNOSIS — K439 Ventral hernia without obstruction or gangrene: Secondary | ICD-10-CM

## 2024-03-17 ENCOUNTER — Ambulatory Visit: Admitting: Nurse Practitioner

## 2024-03-17 ENCOUNTER — Encounter: Payer: Self-pay | Admitting: Nurse Practitioner

## 2024-03-17 VITALS — BP 140/82 | HR 107 | Ht 60.0 in | Wt 192.0 lb

## 2024-03-17 DIAGNOSIS — F410 Panic disorder [episodic paroxysmal anxiety] without agoraphobia: Secondary | ICD-10-CM | POA: Diagnosis not present

## 2024-03-17 DIAGNOSIS — F339 Major depressive disorder, recurrent, unspecified: Secondary | ICD-10-CM | POA: Diagnosis not present

## 2024-03-17 DIAGNOSIS — Z6837 Body mass index (BMI) 37.0-37.9, adult: Secondary | ICD-10-CM

## 2024-03-17 DIAGNOSIS — E66812 Obesity, class 2: Secondary | ICD-10-CM

## 2024-03-17 DIAGNOSIS — Z9889 Other specified postprocedural states: Secondary | ICD-10-CM

## 2024-03-17 DIAGNOSIS — F900 Attention-deficit hyperactivity disorder, predominantly inattentive type: Secondary | ICD-10-CM | POA: Diagnosis not present

## 2024-03-17 DIAGNOSIS — I10 Essential (primary) hypertension: Secondary | ICD-10-CM

## 2024-03-17 DIAGNOSIS — E119 Type 2 diabetes mellitus without complications: Secondary | ICD-10-CM

## 2024-03-17 DIAGNOSIS — Z8719 Personal history of other diseases of the digestive system: Secondary | ICD-10-CM

## 2024-03-17 DIAGNOSIS — E7841 Elevated Lipoprotein(a): Secondary | ICD-10-CM

## 2024-03-17 LAB — LIPID PANEL

## 2024-03-17 NOTE — Patient Instructions (Addendum)
 I want you to set a goal for every day to get some activity. Set an alarm, ask a friend to call, whatever can help keep you motivated.   Work to get your bowels back to at least every other day. You can go to see the doctor that did your colonoscopy to help if this doesn't get better.   Constipation, Adult Constipation is when a person has fewer than three bowel movements in a week, has difficulty having a bowel movement, or has stools (feces) that are dry, hard, or larger than normal. Constipation may be caused by an underlying condition. It may become worse with age if a person takes certain medicines and does not take in enough fluids. Follow these instructions at home: Eating and drinking  Eat foods that have a lot of fiber, such as beans, whole grains, and fresh fruits and vegetables. Limit foods that are low in fiber and high in fat and processed sugars, such as fried or sweet foods. These include french fries, hamburgers, cookies, candies, and soda. Drink enough fluid to keep your urine pale yellow. General instructions Exercise regularly or as told by your health care provider. Try to do 150 minutes of moderate exercise each week. Use the bathroom when you have the urge to go. Do not hold it in. Take over-the-counter and prescription medicines only as told by your health care provider. This includes any fiber supplements. During bowel movements: Practice deep breathing while relaxing the lower abdomen. Practice pelvic floor relaxation. Watch your condition for any changes. Let your health care provider know about them. Keep all follow-up visits as told by your health care provider. This is important. Contact a health care provider if: You have pain that gets worse. You have a fever. You do not have a bowel movement after 4 days. You vomit. You are not hungry or you lose weight. You are bleeding from the opening between the buttocks (anus). You have thin, pencil-like stools. Get  help right away if: You have a fever and your symptoms suddenly get worse. You leak stool or have blood in your stool. Your abdomen is bloated. You have severe pain in your abdomen. You feel dizzy or you faint. Summary Constipation is when a person has fewer than three bowel movements in a week, has difficulty having a bowel movement, or has stools (feces) that are dry, hard, or larger than normal. Eat foods that have a lot of fiber, such as beans, whole grains, and fresh fruits and vegetables. Drink enough fluid to keep your urine pale yellow. Take over-the-counter and prescription medicines only as told by your health care provider. This includes any fiber supplements. This information is not intended to replace advice given to you by your health care provider. Make sure you discuss any questions you have with your health care provider. Document Revised: 06/11/2022 Document Reviewed: 06/11/2022 Elsevier Patient Education  2024 [employer].

## 2024-03-17 NOTE — Progress Notes (Signed)
 Michele Doing, DNP, AGNP-c [facility]  [address] [place], [place] [address] [phone]  ESTABLISHED PATIENT- Chronic Health and/or Follow-Up Visit  Blood pressure (!) 140/82, pulse (!) 107, height 5' (1.524 m), weight 192 lb (87.1 kg), last menstrual period 10/07/2021.   History of Present Illness Michele [name] is a 58 year old female with a history of multiple abdominal surgeries who presents with ongoing abdominal pain and bowel movement issues.  She reports persistent abdominal pain and discomfort following multiple abdominal surgeries, including hernia repair, hysterectomy, and bowel resection. She describes numbness and soreness in the abdominal area, particularly noting an inability to feel sensations such as a tick crawling on her skin. She reports pain when sneezing, and describes the area as 'numb and gross.'  She has difficulties with bowel movements, noting that Miralax , which previously worked, is no longer effective. She has tried various treatments including probiotics, fiber gummies, fiber cereal, Senna, and Dulcolax, but Dulcolax caused vomiting. She mentions taking up to eight Senna tablets to achieve a bowel movement. Discomfort after eating is reported, and she fears potential complications such as rupture.  She has a history of depression, manifesting as a lack of motivation and energy rather than sadness. She takes Celexa  daily and occasionally uses Klonopin  for anxiety. She reports excessive sleep at times and struggles with maintaining a regular sleep schedule. Her depression impacts her motivation to exercise and manage her weight.  She has gained weight since her hernia surgery, attributing it to decreased physical activity due to pain and discomfort. She wants to lose weight, recognizing that it would likely alleviate some of her symptoms. She mentions using a treadmill and walking, but finds it challenging due to the extra weight  and discomfort.  Her family history includes a father who passed away from a heart attack at 93, and she has a personal history of elevated lipoprotein A. Recent testing includes a finger prick A1c of 6.3, and she reports taking Metformin  inconsistently. She also takes Crestor  for cholesterol management.  All ROS negative with exception of what is listed above.   PHYSICAL EXAM Physical Exam Vitals and nursing note reviewed.  Constitutional:      General: She is not in acute distress.    Appearance: She is obese. She is not ill-appearing.  HENT:     Head: Normocephalic.  Eyes:     Pupils: Pupils are equal, round, and reactive to light.  Neck:     Vascular: No carotid bruit.  Cardiovascular:     Rate and Rhythm: Normal rate and regular rhythm.     Pulses: Normal pulses.     Heart sounds: Normal heart sounds.  Pulmonary:     Effort: Pulmonary effort is normal.     Breath sounds: Normal breath sounds.  Abdominal:     General: There is no distension.     Palpations: There is no mass.     Tenderness: There is abdominal tenderness. There is no right CVA tenderness, left CVA tenderness or guarding.  Musculoskeletal:     Cervical back: No tenderness.     Right lower leg: No edema.     Left lower leg: No edema.  Skin:    General: Skin is warm and dry.     Capillary Refill: Capillary refill takes less than 2 seconds.  Neurological:     Mental Status: She is alert and oriented to person, place, and time.     Motor: Weakness present.  PLAN Problem List Items Addressed This Visit     Panic disorder (Chronic)   See depression      Hyperlipidemia   Hyperlipidemia with recent efforts to improve adherence to Crestor . Weight loss and lifestyle changes are important for lipid management. - Encourage consistent use of Crestor  as prescribed. - Promote lifestyle modifications, including diet and exercise, to improve lipid profile. - Monitor lipid levels regularly.      Type 2  diabetes mellitus without complication, without long-term current use of insulin  (HCC)   Type 2 diabetes with suboptimal control, as indicated by recent A1c of 6.3%. Current treatment includes Metformin , with inconsistent adherence. Weight management and increased physical activity are crucial for improving glycemic control. - Encourage consistent use of Metformin  as prescribed. - Promote lifestyle modifications, including diet and exercise, to improve glycemic control. - Consider GLP-1 agonists for additional glycemic control if appropriate.      Relevant Orders   CBC with Differential/Platelet (Completed)   CMP14+EGFR (Completed)   Lipid panel (Completed)   Microalbumin/Creatinine Ratio, Urine (Completed)   CT CARDIAC SCORING (SELF PAY ONLY)   Attention deficit hyperactivity disorder (ADHD), predominantly inattentive type   Attention deficit hyperactivity disorder (ADHD) ADHD with current treatment including Adderall , which aids in task completion and motivation. - Continue current ADHD medication regimen. - Encourage structured daily routine to improve focus and productivity.      Depression, recurrent (HCC) - Primary   Chronic depression with symptoms of low motivation, fatigue, and disrupted sleep patterns, exacerbated by recent abdominal surgeries and associated discomfort. Current treatment includes Celexa , with occasional use of Klonopin  for anxiety. Reports improvement in anxiety but persistent depressive symptoms, possibly related to situational factors and chronic pain. Discussed impact of physical activity and social engagement on mood improvement. - Encourage daily physical activity to improve mood and energy levels. - Discuss potential benefits of adding Vraylar  to current treatment regimen. - Encourage social engagement and routine establishment to improve mood.      S/P colostomy takedown   Persistent abdominal pain and numbness following multiple abdominal surgeries,  including hernia repair and ostomy takedown. Symptoms include numbness and discomfort, particularly postprandial, with concerns about scar tissue and mesh complications. Pain is likely multifactorial, involving scar tissue and possible nerve damage. Some numbness may be permanent but could improve over time. Chronic constipation following abdominal surgery, with ineffective response to Miralax  and adverse reactions to Dulcolax. Symptoms include infrequent bowel movements and discomfort, likely related to decreased physical activity and insufficient fluid intake. Importance of avoiding overuse of stimulant laxatives like Senna discussed. - Encourage physical therapy to strengthen abdominal muscles and reduce pain. - Advise against continuous use of abdominal binder to allow muscle strengthening. - Encourage increased fluid intake to aid bowel movements. - Continue Miralax  as needed, adjusting dosage for effectiveness. - Encourage regular physical activity to stimulate bowel function. - Consider consultation with gastroenterologist if constipation persists.       Class 2 severe obesity due to excess calories with serious comorbidity and body mass index (BMI) of 37.0 to 37.9 in adult [facility])   Obesity with recent weight gain following abdominal surgeries, contributing to physical discomfort and exacerbating other health issues. Weight gain likely due to decreased physical activity and possible emotional eating related to depression. Discussed potential benefits of weight loss on overall health and chronic conditions. - Encourage daily physical activity, starting with light exercises and gradually increasing intensity. - Explore potential use of GLP-1 agonists for weight management if appropriate.  Relevant Orders   CBC with Differential/Platelet (Completed)   CMP14+EGFR (Completed)   Lipid panel (Completed)   Microalbumin/Creatinine Ratio, Urine (Completed)   CT CARDIAC SCORING (SELF PAY ONLY)    Primary hypertension   Hypertension with recent elevated blood pressure reading, possibly related to weight gain and medication effects. Blood pressure control is essential to reduce cardiovascular risk. - Encourage weight loss and increased physical activity to aid blood pressure control. - Review current medications and adjust as necessary.      Other Visit Diagnoses       S/P hernia repair           Return in about 6 months (around 09/17/2024) for CPE.  Michele Kambria Grima, DNP, AGNP-c Time: 56 minutes, >50% spent counseling, care coordination, chart review, and documentation.

## 2024-03-18 LAB — CMP14+EGFR
ALT: 17 IU/L (ref 0–32)
AST: 18 IU/L (ref 0–40)
Albumin: 4.3 g/dL (ref 3.8–4.9)
Alkaline Phosphatase: 104 IU/L (ref 44–121)
BUN/Creatinine Ratio: 12 (ref 9–23)
BUN: 12 mg/dL (ref 6–24)
Bilirubin Total: 0.2 mg/dL (ref 0.0–1.2)
CO2: 22 mmol/L (ref 20–29)
Calcium: 9.4 mg/dL (ref 8.7–10.2)
Chloride: 103 mmol/L (ref 96–106)
Creatinine, Ser: 0.98 mg/dL (ref 0.57–1.00)
Globulin, Total: 2.5 g/dL (ref 1.5–4.5)
Glucose: 90 mg/dL (ref 70–99)
Potassium: 4.2 mmol/L (ref 3.5–5.2)
Sodium: 140 mmol/L (ref 134–144)
Total Protein: 6.8 g/dL (ref 6.0–8.5)
eGFR: 68 mL/min/1.73 (ref >59)

## 2024-03-18 LAB — MICROALBUMIN / CREATININE URINE RATIO
Creatinine, Urine: 215.2 mg/dL
Microalb/Creat Ratio: 6 mg/g{creat} (ref 0–29)
Microalbumin, Urine: 12.4 ug/mL

## 2024-03-18 LAB — CBC WITH DIFFERENTIAL/PLATELET
Basophils Absolute: 0 x10E3/uL (ref 0.0–0.2)
Basos: 1 %
EOS (ABSOLUTE): 0.1 x10E3/uL (ref 0.0–0.4)
Eos: 2 %
Hematocrit: 41.3 % (ref 34.0–46.6)
Hemoglobin: 13.2 g/dL (ref 11.1–15.9)
Immature Grans (Abs): 0 x10E3/uL (ref 0.0–0.1)
Immature Granulocytes: 0 %
Lymphocytes Absolute: 1.3 x10E3/uL (ref 0.7–3.1)
Lymphs: 32 %
MCH: 25.9 pg — ABNORMAL LOW (ref 26.6–33.0)
MCHC: 32 g/dL (ref 31.5–35.7)
MCV: 81 fL (ref 79–97)
Monocytes Absolute: 0.4 x10E3/uL (ref 0.1–0.9)
Monocytes: 9 %
Neutrophils Absolute: 2.2 x10E3/uL (ref 1.4–7.0)
Neutrophils: 56 %
Platelets: 313 x10E3/uL (ref 150–450)
RBC: 5.09 x10E6/uL (ref 3.77–5.28)
RDW: 14.1 % (ref 11.7–15.4)
WBC: 4 x10E3/uL (ref 3.4–10.8)

## 2024-03-18 LAB — LIPID PANEL
Cholesterol, Total: 190 mg/dL (ref 100–199)
HDL: 67 mg/dL (ref >39)
LDL CALC COMMENT:: 2.8 ratio (ref 0.0–4.4)
LDL Chol Calc (NIH): 95 mg/dL (ref 0–99)
Triglycerides: 165 mg/dL — AB (ref 0–149)
VLDL Cholesterol Cal: 28 mg/dL (ref 5–40)

## 2024-03-22 ENCOUNTER — Encounter: Payer: Self-pay | Admitting: Nurse Practitioner

## 2024-03-22 ENCOUNTER — Ambulatory Visit: Payer: Self-pay | Admitting: Nurse Practitioner

## 2024-03-22 DIAGNOSIS — I10 Essential (primary) hypertension: Secondary | ICD-10-CM | POA: Insufficient documentation

## 2024-03-22 NOTE — Assessment & Plan Note (Signed)
 Chronic depression with symptoms of low motivation, fatigue, and disrupted sleep patterns, exacerbated by recent abdominal surgeries and associated discomfort. Current treatment includes Celexa , with occasional use of Klonopin  for anxiety. Reports improvement in anxiety but persistent depressive symptoms, possibly related to situational factors and chronic pain. Discussed impact of physical activity and social engagement on mood improvement. - Encourage daily physical activity to improve mood and energy levels. - Discuss potential benefits of adding Vraylar  to current treatment regimen. - Encourage social engagement and routine establishment to improve mood.

## 2024-03-22 NOTE — Assessment & Plan Note (Signed)
 Persistent abdominal pain and numbness following multiple abdominal surgeries, including hernia repair and ostomy takedown. Symptoms include numbness and discomfort, particularly postprandial, with concerns about scar tissue and mesh complications. Pain is likely multifactorial, involving scar tissue and possible nerve damage. Some numbness may be permanent but could improve over time. Chronic constipation following abdominal surgery, with ineffective response to Miralax  and adverse reactions to Dulcolax. Symptoms include infrequent bowel movements and discomfort, likely related to decreased physical activity and insufficient fluid intake. Importance of avoiding overuse of stimulant laxatives like Senna discussed. - Encourage physical therapy to strengthen abdominal muscles and reduce pain. - Advise against continuous use of abdominal binder to allow muscle strengthening. - Encourage increased fluid intake to aid bowel movements. - Continue Miralax  as needed, adjusting dosage for effectiveness. - Encourage regular physical activity to stimulate bowel function. - Consider consultation with gastroenterologist if constipation persists.

## 2024-03-22 NOTE — Assessment & Plan Note (Signed)
 Type 2 diabetes with suboptimal control, as indicated by recent A1c of 6.3%. Current treatment includes Metformin , with inconsistent adherence. Weight management and increased physical activity are crucial for improving glycemic control. - Encourage consistent use of Metformin  as prescribed. - Promote lifestyle modifications, including diet and exercise, to improve glycemic control. - Consider GLP-1 agonists for additional glycemic control if appropriate.

## 2024-03-22 NOTE — Assessment & Plan Note (Signed)
 Attention deficit hyperactivity disorder (ADHD) ADHD with current treatment including Adderall , which aids in task completion and motivation. - Continue current ADHD medication regimen. - Encourage structured daily routine to improve focus and productivity.

## 2024-03-22 NOTE — Assessment & Plan Note (Signed)
 See depression

## 2024-03-22 NOTE — Assessment & Plan Note (Signed)
 Hyperlipidemia with recent efforts to improve adherence to Crestor . Weight loss and lifestyle changes are important for lipid management. - Encourage consistent use of Crestor  as prescribed. - Promote lifestyle modifications, including diet and exercise, to improve lipid profile. - Monitor lipid levels regularly.

## 2024-03-22 NOTE — Assessment & Plan Note (Signed)
 Hypertension with recent elevated blood pressure reading, possibly related to weight gain and medication effects. Blood pressure control is essential to reduce cardiovascular risk. - Encourage weight loss and increased physical activity to aid blood pressure control. - Review current medications and adjust as necessary.

## 2024-03-22 NOTE — Assessment & Plan Note (Signed)
 Obesity with recent weight gain following abdominal surgeries, contributing to physical discomfort and exacerbating other health issues. Weight gain likely due to decreased physical activity and possible emotional eating related to depression. Discussed potential benefits of weight loss on overall health and chronic conditions. - Encourage daily physical activity, starting with light exercises and gradually increasing intensity. - Explore potential use of GLP-1 agonists for weight management if appropriate.

## 2024-03-27 ENCOUNTER — Other Ambulatory Visit: Payer: Self-pay | Admitting: Medical Genetics

## 2024-03-28 ENCOUNTER — Ambulatory Visit: Admitting: Nurse Practitioner

## 2024-04-04 DIAGNOSIS — M6281 Muscle weakness (generalized): Secondary | ICD-10-CM | POA: Diagnosis not present

## 2024-04-04 DIAGNOSIS — R1084 Generalized abdominal pain: Secondary | ICD-10-CM | POA: Diagnosis not present

## 2024-04-05 ENCOUNTER — Other Ambulatory Visit (HOSPITAL_BASED_OUTPATIENT_CLINIC_OR_DEPARTMENT_OTHER)

## 2024-04-06 DIAGNOSIS — R1084 Generalized abdominal pain: Secondary | ICD-10-CM | POA: Diagnosis not present

## 2024-04-06 DIAGNOSIS — M6281 Muscle weakness (generalized): Secondary | ICD-10-CM | POA: Diagnosis not present

## 2024-05-11 ENCOUNTER — Other Ambulatory Visit (HOSPITAL_COMMUNITY)

## 2024-07-06 ENCOUNTER — Ambulatory Visit (HOSPITAL_BASED_OUTPATIENT_CLINIC_OR_DEPARTMENT_OTHER)

## 2024-07-19 ENCOUNTER — Encounter: Payer: Self-pay | Admitting: Nurse Practitioner

## 2024-07-19 ENCOUNTER — Other Ambulatory Visit: Payer: Self-pay

## 2024-07-19 DIAGNOSIS — K631 Perforation of intestine (nontraumatic): Secondary | ICD-10-CM

## 2024-07-19 DIAGNOSIS — E7800 Pure hypercholesterolemia, unspecified: Secondary | ICD-10-CM

## 2024-07-19 DIAGNOSIS — Z0000 Encounter for general adult medical examination without abnormal findings: Secondary | ICD-10-CM

## 2024-07-19 MED ORDER — ROSUVASTATIN CALCIUM 5 MG PO TABS: 5.0000 mg | ORAL_TABLET | Freq: Every day | ORAL | 2 refills | Status: AC

## 2024-07-20 DIAGNOSIS — Z1231 Encounter for screening mammogram for malignant neoplasm of breast: Secondary | ICD-10-CM | POA: Diagnosis not present

## 2024-07-20 LAB — HM MAMMOGRAPHY

## 2024-07-21 ENCOUNTER — Encounter: Payer: Self-pay | Admitting: Nurse Practitioner

## 2024-07-22 ENCOUNTER — Other Ambulatory Visit (HOSPITAL_BASED_OUTPATIENT_CLINIC_OR_DEPARTMENT_OTHER)

## 2024-08-05 ENCOUNTER — Other Ambulatory Visit: Payer: Self-pay | Admitting: Nurse Practitioner

## 2024-08-05 DIAGNOSIS — F339 Major depressive disorder, recurrent, unspecified: Secondary | ICD-10-CM

## 2024-08-05 NOTE — Telephone Encounter (Signed)
 Last appt. 03/17/24

## 2024-08-08 ENCOUNTER — Other Ambulatory Visit (HOSPITAL_COMMUNITY)

## 2024-08-18 ENCOUNTER — Ambulatory Visit (HOSPITAL_BASED_OUTPATIENT_CLINIC_OR_DEPARTMENT_OTHER)

## 2024-08-23 ENCOUNTER — Ambulatory Visit: Admitting: Podiatry

## 2024-08-26 ENCOUNTER — Other Ambulatory Visit (HOSPITAL_BASED_OUTPATIENT_CLINIC_OR_DEPARTMENT_OTHER)

## 2024-09-01 ENCOUNTER — Other Ambulatory Visit: Payer: Self-pay | Admitting: Medical Genetics

## 2024-09-01 DIAGNOSIS — Z006 Encounter for examination for normal comparison and control in clinical research program: Secondary | ICD-10-CM

## 2024-09-28 ENCOUNTER — Other Ambulatory Visit (HOSPITAL_COMMUNITY)
